# Patient Record
Sex: Male | Born: 1945 | Race: White | Hispanic: No | Marital: Married | Smoking: Never smoker
Health system: Southern US, Community
[De-identification: ages and names within clinical notes are randomized; demographics above are authoritative.]

## PROBLEM LIST (undated history)

## (undated) DIAGNOSIS — I10 Essential (primary) hypertension: Secondary | ICD-10-CM

## (undated) DIAGNOSIS — N189 Chronic kidney disease, unspecified: Secondary | ICD-10-CM

## (undated) DIAGNOSIS — F4310 Post-traumatic stress disorder, unspecified: Secondary | ICD-10-CM

## (undated) DIAGNOSIS — T884XXA Failed or difficult intubation, initial encounter: Secondary | ICD-10-CM

## (undated) DIAGNOSIS — I5032 Chronic diastolic (congestive) heart failure: Secondary | ICD-10-CM

## (undated) DIAGNOSIS — T4145XA Adverse effect of unspecified anesthetic, initial encounter: Secondary | ICD-10-CM

## (undated) DIAGNOSIS — I350 Nonrheumatic aortic (valve) stenosis: Secondary | ICD-10-CM

## (undated) DIAGNOSIS — I272 Pulmonary hypertension, unspecified: Secondary | ICD-10-CM

## (undated) DIAGNOSIS — S069X9A Unspecified intracranial injury with loss of consciousness of unspecified duration, initial encounter: Secondary | ICD-10-CM

## (undated) DIAGNOSIS — E662 Morbid (severe) obesity with alveolar hypoventilation: Secondary | ICD-10-CM

## (undated) DIAGNOSIS — F329 Major depressive disorder, single episode, unspecified: Secondary | ICD-10-CM

## (undated) DIAGNOSIS — N184 Chronic kidney disease, stage 4 (severe): Secondary | ICD-10-CM

## (undated) DIAGNOSIS — F32A Depression, unspecified: Secondary | ICD-10-CM

## (undated) DIAGNOSIS — C109 Malignant neoplasm of oropharynx, unspecified: Secondary | ICD-10-CM

## (undated) DIAGNOSIS — S069XAA Unspecified intracranial injury with loss of consciousness status unknown, initial encounter: Secondary | ICD-10-CM

## (undated) DIAGNOSIS — K224 Dyskinesia of esophagus: Secondary | ICD-10-CM

## (undated) DIAGNOSIS — J9611 Chronic respiratory failure with hypoxia: Secondary | ICD-10-CM

## (undated) DIAGNOSIS — D631 Anemia in chronic kidney disease: Secondary | ICD-10-CM

## (undated) DIAGNOSIS — E039 Hypothyroidism, unspecified: Secondary | ICD-10-CM

## (undated) DIAGNOSIS — K220 Achalasia of cardia: Secondary | ICD-10-CM

## (undated) DIAGNOSIS — T8859XA Other complications of anesthesia, initial encounter: Secondary | ICD-10-CM

## (undated) DIAGNOSIS — Z923 Personal history of irradiation: Secondary | ICD-10-CM

## (undated) HISTORY — DX: Post-traumatic stress disorder, unspecified: F43.10

## (undated) HISTORY — DX: Failed or difficult intubation, initial encounter: T88.4XXA

## (undated) HISTORY — DX: Essential (primary) hypertension: I10

## (undated) HISTORY — DX: Morbid (severe) obesity with alveolar hypoventilation: E66.2

## (undated) HISTORY — PX: LEG REPLANTATION: SHX1002

## (undated) HISTORY — PX: HIP ARTHROPLASTY: SHX981

## (undated) HISTORY — DX: Achalasia of cardia: K22.0

## (undated) HISTORY — DX: Morbid (severe) obesity due to excess calories: E66.01

---

## 1985-09-12 HISTORY — PX: HELLER MYOTOMY: SHX5259

## 2000-03-14 ENCOUNTER — Ambulatory Visit (HOSPITAL_COMMUNITY): Admission: RE | Admit: 2000-03-14 | Discharge: 2000-03-14 | Payer: Self-pay | Admitting: Gastroenterology

## 2000-08-30 ENCOUNTER — Emergency Department (HOSPITAL_COMMUNITY): Admission: EM | Admit: 2000-08-30 | Discharge: 2000-08-31 | Payer: Self-pay | Admitting: Emergency Medicine

## 2000-08-31 ENCOUNTER — Encounter: Payer: Self-pay | Admitting: Emergency Medicine

## 2001-08-27 ENCOUNTER — Encounter: Payer: Self-pay | Admitting: Gastroenterology

## 2001-08-27 ENCOUNTER — Encounter: Admission: RE | Admit: 2001-08-27 | Discharge: 2001-08-27 | Payer: Self-pay | Admitting: Gastroenterology

## 2002-11-07 ENCOUNTER — Ambulatory Visit (HOSPITAL_BASED_OUTPATIENT_CLINIC_OR_DEPARTMENT_OTHER): Admission: RE | Admit: 2002-11-07 | Discharge: 2002-11-07 | Payer: Self-pay | Admitting: Orthopedic Surgery

## 2002-11-08 HISTORY — PX: KNEE ARTHROSCOPY: SUR90

## 2002-11-08 HISTORY — PX: KNEE ARTHROSCOPY W/ PARTIAL MEDIAL MENISCECTOMY: SHX1882

## 2002-11-08 HISTORY — PX: CHONDROPLASTY: SHX5177

## 2005-10-16 ENCOUNTER — Ambulatory Visit: Payer: Self-pay | Admitting: Physical Medicine & Rehabilitation

## 2005-10-16 ENCOUNTER — Inpatient Hospital Stay (HOSPITAL_COMMUNITY): Admission: AC | Admit: 2005-10-16 | Discharge: 2005-11-09 | Payer: Self-pay

## 2005-11-09 ENCOUNTER — Inpatient Hospital Stay (HOSPITAL_COMMUNITY)
Admission: RE | Admit: 2005-11-09 | Discharge: 2005-11-24 | Payer: Self-pay | Admitting: Physical Medicine & Rehabilitation

## 2005-12-27 ENCOUNTER — Encounter
Admission: RE | Admit: 2005-12-27 | Discharge: 2006-03-27 | Payer: Self-pay | Admitting: Physical Medicine & Rehabilitation

## 2005-12-27 ENCOUNTER — Ambulatory Visit: Payer: Self-pay | Admitting: Physical Medicine & Rehabilitation

## 2005-12-27 ENCOUNTER — Encounter: Admission: RE | Admit: 2005-12-27 | Discharge: 2006-03-27 | Payer: Self-pay | Admitting: Orthopedic Surgery

## 2006-02-13 ENCOUNTER — Ambulatory Visit: Payer: Self-pay | Admitting: Psychology

## 2006-03-02 ENCOUNTER — Ambulatory Visit: Payer: Self-pay | Admitting: Physical Medicine & Rehabilitation

## 2006-04-06 ENCOUNTER — Encounter
Admission: RE | Admit: 2006-04-06 | Discharge: 2006-07-05 | Payer: Self-pay | Admitting: Physical Medicine & Rehabilitation

## 2006-04-06 ENCOUNTER — Ambulatory Visit: Payer: Self-pay | Admitting: Physical Medicine & Rehabilitation

## 2006-07-18 ENCOUNTER — Ambulatory Visit: Payer: Self-pay | Admitting: Psychology

## 2006-07-18 ENCOUNTER — Encounter
Admission: RE | Admit: 2006-07-18 | Discharge: 2006-07-18 | Payer: Self-pay | Admitting: Physical Medicine & Rehabilitation

## 2006-07-28 ENCOUNTER — Encounter
Admission: RE | Admit: 2006-07-28 | Discharge: 2006-10-26 | Payer: Self-pay | Admitting: Physical Medicine & Rehabilitation

## 2006-07-28 ENCOUNTER — Ambulatory Visit: Payer: Self-pay | Admitting: Physical Medicine & Rehabilitation

## 2006-08-23 ENCOUNTER — Ambulatory Visit: Payer: Self-pay | Admitting: Cardiology

## 2006-08-23 ENCOUNTER — Inpatient Hospital Stay (HOSPITAL_COMMUNITY): Admission: RE | Admit: 2006-08-23 | Discharge: 2006-08-28 | Payer: Self-pay | Admitting: Pulmonary Disease

## 2006-08-23 ENCOUNTER — Ambulatory Visit: Payer: Self-pay | Admitting: Pulmonary Disease

## 2006-08-24 ENCOUNTER — Encounter: Payer: Self-pay | Admitting: Cardiology

## 2006-08-29 ENCOUNTER — Encounter: Admission: RE | Admit: 2006-08-29 | Discharge: 2006-10-04 | Payer: Self-pay | Admitting: Pulmonary Disease

## 2006-08-30 ENCOUNTER — Encounter: Admission: RE | Admit: 2006-08-30 | Discharge: 2006-11-28 | Payer: Self-pay | Admitting: Pulmonary Disease

## 2006-08-31 ENCOUNTER — Emergency Department (HOSPITAL_COMMUNITY): Admission: EM | Admit: 2006-08-31 | Discharge: 2006-08-31 | Payer: Self-pay | Admitting: Emergency Medicine

## 2006-10-11 ENCOUNTER — Inpatient Hospital Stay (HOSPITAL_COMMUNITY): Admission: RE | Admit: 2006-10-11 | Discharge: 2006-10-16 | Payer: Self-pay | Admitting: Orthopedic Surgery

## 2006-11-07 ENCOUNTER — Encounter: Admission: RE | Admit: 2006-11-07 | Discharge: 2006-12-14 | Payer: Self-pay | Admitting: Orthopedic Surgery

## 2006-12-06 ENCOUNTER — Ambulatory Visit: Payer: Self-pay | Admitting: Physical Medicine & Rehabilitation

## 2006-12-06 ENCOUNTER — Encounter
Admission: RE | Admit: 2006-12-06 | Discharge: 2007-03-06 | Payer: Self-pay | Admitting: Physical Medicine & Rehabilitation

## 2007-03-06 ENCOUNTER — Ambulatory Visit: Payer: Self-pay | Admitting: Physical Medicine & Rehabilitation

## 2007-03-06 ENCOUNTER — Encounter
Admission: RE | Admit: 2007-03-06 | Discharge: 2007-03-12 | Payer: Self-pay | Admitting: Physical Medicine & Rehabilitation

## 2007-04-17 ENCOUNTER — Ambulatory Visit (HOSPITAL_BASED_OUTPATIENT_CLINIC_OR_DEPARTMENT_OTHER)
Admission: RE | Admit: 2007-04-17 | Discharge: 2007-04-17 | Payer: Self-pay | Admitting: Physical Medicine & Rehabilitation

## 2007-04-21 ENCOUNTER — Ambulatory Visit: Payer: Self-pay | Admitting: Internal Medicine

## 2007-05-03 ENCOUNTER — Ambulatory Visit: Payer: Self-pay | Admitting: Physical Medicine & Rehabilitation

## 2007-05-04 ENCOUNTER — Encounter
Admission: RE | Admit: 2007-05-04 | Discharge: 2007-06-12 | Payer: Self-pay | Admitting: Physical Medicine & Rehabilitation

## 2007-05-10 ENCOUNTER — Ambulatory Visit: Payer: Self-pay | Admitting: Pulmonary Disease

## 2007-07-23 ENCOUNTER — Encounter
Admission: RE | Admit: 2007-07-23 | Discharge: 2007-07-24 | Payer: Self-pay | Admitting: Physical Medicine & Rehabilitation

## 2007-07-23 ENCOUNTER — Ambulatory Visit: Payer: Self-pay | Admitting: Physical Medicine & Rehabilitation

## 2007-09-26 ENCOUNTER — Encounter
Admission: RE | Admit: 2007-09-26 | Discharge: 2007-11-23 | Payer: Self-pay | Admitting: Physical Medicine & Rehabilitation

## 2007-10-18 ENCOUNTER — Ambulatory Visit: Payer: Self-pay | Admitting: Physical Medicine & Rehabilitation

## 2007-10-18 ENCOUNTER — Encounter
Admission: RE | Admit: 2007-10-18 | Discharge: 2007-10-19 | Payer: Self-pay | Admitting: Physical Medicine & Rehabilitation

## 2008-02-11 ENCOUNTER — Emergency Department (HOSPITAL_COMMUNITY): Admission: EM | Admit: 2008-02-11 | Discharge: 2008-02-11 | Payer: Self-pay | Admitting: Emergency Medicine

## 2008-04-10 ENCOUNTER — Encounter
Admission: RE | Admit: 2008-04-10 | Discharge: 2008-05-26 | Payer: Self-pay | Admitting: Physical Medicine & Rehabilitation

## 2008-04-14 ENCOUNTER — Ambulatory Visit: Payer: Self-pay | Admitting: Physical Medicine & Rehabilitation

## 2008-05-26 ENCOUNTER — Ambulatory Visit: Payer: Self-pay | Admitting: Physical Medicine & Rehabilitation

## 2008-07-22 ENCOUNTER — Encounter
Admission: RE | Admit: 2008-07-22 | Discharge: 2008-07-23 | Payer: Self-pay | Admitting: Physical Medicine & Rehabilitation

## 2008-07-23 ENCOUNTER — Ambulatory Visit: Payer: Self-pay | Admitting: Physical Medicine & Rehabilitation

## 2008-10-22 ENCOUNTER — Encounter
Admission: RE | Admit: 2008-10-22 | Discharge: 2008-10-24 | Payer: Self-pay | Admitting: Physical Medicine & Rehabilitation

## 2008-10-24 ENCOUNTER — Ambulatory Visit: Payer: Self-pay | Admitting: Physical Medicine & Rehabilitation

## 2009-02-17 ENCOUNTER — Encounter
Admission: RE | Admit: 2009-02-17 | Discharge: 2009-03-10 | Payer: Self-pay | Admitting: Physical Medicine & Rehabilitation

## 2009-02-23 ENCOUNTER — Ambulatory Visit: Payer: Self-pay | Admitting: Physical Medicine & Rehabilitation

## 2009-03-12 ENCOUNTER — Encounter: Payer: Self-pay | Admitting: Pulmonary Disease

## 2009-03-13 ENCOUNTER — Encounter: Payer: Self-pay | Admitting: Pulmonary Disease

## 2009-03-19 DIAGNOSIS — I10 Essential (primary) hypertension: Secondary | ICD-10-CM

## 2009-03-20 ENCOUNTER — Ambulatory Visit: Payer: Self-pay | Admitting: Pulmonary Disease

## 2009-04-01 ENCOUNTER — Ambulatory Visit (HOSPITAL_BASED_OUTPATIENT_CLINIC_OR_DEPARTMENT_OTHER): Admission: RE | Admit: 2009-04-01 | Discharge: 2009-04-01 | Payer: Self-pay | Admitting: Pulmonary Disease

## 2009-04-01 ENCOUNTER — Encounter: Payer: Self-pay | Admitting: Pulmonary Disease

## 2009-04-06 ENCOUNTER — Encounter: Payer: Self-pay | Admitting: Pulmonary Disease

## 2009-04-08 ENCOUNTER — Ambulatory Visit: Payer: Self-pay | Admitting: Internal Medicine

## 2009-04-08 LAB — CONVERTED CEMR LAB
CO2: 34 meq/L — ABNORMAL HIGH (ref 19–32)
Creatinine, Ser: 1 mg/dL (ref 0.4–1.5)
Potassium: 4 meq/L (ref 3.5–5.1)
Sodium: 143 meq/L (ref 135–145)

## 2009-04-09 ENCOUNTER — Telehealth: Payer: Self-pay | Admitting: Pulmonary Disease

## 2009-04-10 ENCOUNTER — Ambulatory Visit: Payer: Self-pay | Admitting: Pulmonary Disease

## 2009-04-13 ENCOUNTER — Telehealth: Payer: Self-pay | Admitting: Internal Medicine

## 2009-04-14 ENCOUNTER — Encounter: Payer: Self-pay | Admitting: Internal Medicine

## 2009-04-16 ENCOUNTER — Ambulatory Visit: Payer: Self-pay

## 2009-04-16 ENCOUNTER — Ambulatory Visit: Payer: Self-pay | Admitting: Cardiology

## 2009-04-16 ENCOUNTER — Encounter: Payer: Self-pay | Admitting: Internal Medicine

## 2009-04-16 ENCOUNTER — Telehealth (INDEPENDENT_AMBULATORY_CARE_PROVIDER_SITE_OTHER): Payer: Self-pay | Admitting: *Deleted

## 2009-04-23 ENCOUNTER — Encounter: Payer: Self-pay | Admitting: Internal Medicine

## 2009-04-23 ENCOUNTER — Telehealth: Payer: Self-pay | Admitting: Internal Medicine

## 2009-04-24 ENCOUNTER — Telehealth: Payer: Self-pay | Admitting: Internal Medicine

## 2009-05-07 ENCOUNTER — Encounter: Payer: Self-pay | Admitting: Pulmonary Disease

## 2009-05-11 ENCOUNTER — Encounter: Payer: Self-pay | Admitting: Pulmonary Disease

## 2009-05-25 ENCOUNTER — Encounter: Payer: Self-pay | Admitting: Pulmonary Disease

## 2009-05-26 ENCOUNTER — Telehealth: Payer: Self-pay | Admitting: Pulmonary Disease

## 2009-05-27 ENCOUNTER — Ambulatory Visit: Payer: Self-pay | Admitting: Pulmonary Disease

## 2009-06-01 ENCOUNTER — Encounter: Payer: Self-pay | Admitting: Pulmonary Disease

## 2009-06-05 ENCOUNTER — Encounter: Payer: Self-pay | Admitting: Pulmonary Disease

## 2009-06-05 ENCOUNTER — Ambulatory Visit: Payer: Self-pay | Admitting: Internal Medicine

## 2009-06-09 ENCOUNTER — Telehealth (INDEPENDENT_AMBULATORY_CARE_PROVIDER_SITE_OTHER): Payer: Self-pay | Admitting: *Deleted

## 2009-06-10 ENCOUNTER — Ambulatory Visit: Payer: Self-pay

## 2009-06-10 ENCOUNTER — Encounter: Payer: Self-pay | Admitting: Cardiology

## 2009-06-11 ENCOUNTER — Ambulatory Visit: Payer: Self-pay

## 2009-06-20 ENCOUNTER — Encounter: Payer: Self-pay | Admitting: Pulmonary Disease

## 2009-06-23 ENCOUNTER — Telehealth (INDEPENDENT_AMBULATORY_CARE_PROVIDER_SITE_OTHER): Payer: Self-pay | Admitting: *Deleted

## 2009-06-29 ENCOUNTER — Telehealth: Payer: Self-pay | Admitting: Pulmonary Disease

## 2009-06-30 ENCOUNTER — Encounter: Payer: Self-pay | Admitting: Pulmonary Disease

## 2009-08-10 ENCOUNTER — Encounter: Payer: Self-pay | Admitting: Pulmonary Disease

## 2009-09-08 ENCOUNTER — Encounter
Admission: RE | Admit: 2009-09-08 | Discharge: 2009-09-09 | Payer: Self-pay | Admitting: Physical Medicine & Rehabilitation

## 2009-09-09 ENCOUNTER — Ambulatory Visit: Payer: Self-pay | Admitting: Physical Medicine & Rehabilitation

## 2009-09-14 ENCOUNTER — Encounter: Payer: Self-pay | Admitting: Pulmonary Disease

## 2009-09-25 ENCOUNTER — Encounter: Payer: Self-pay | Admitting: Pulmonary Disease

## 2009-10-12 ENCOUNTER — Ambulatory Visit: Payer: Self-pay | Admitting: Pulmonary Disease

## 2009-10-15 ENCOUNTER — Encounter
Admission: RE | Admit: 2009-10-15 | Discharge: 2009-11-05 | Payer: Self-pay | Admitting: Physical Medicine & Rehabilitation

## 2009-12-04 ENCOUNTER — Ambulatory Visit: Payer: Self-pay | Admitting: Internal Medicine

## 2010-03-24 ENCOUNTER — Encounter: Payer: Self-pay | Admitting: Pulmonary Disease

## 2010-04-19 ENCOUNTER — Ambulatory Visit: Payer: Self-pay | Admitting: Pulmonary Disease

## 2010-04-21 ENCOUNTER — Ambulatory Visit: Payer: Self-pay | Admitting: Cardiology

## 2010-04-21 ENCOUNTER — Telehealth: Payer: Self-pay | Admitting: Pulmonary Disease

## 2010-04-22 ENCOUNTER — Other Ambulatory Visit: Admission: RE | Admit: 2010-04-22 | Discharge: 2010-04-22 | Payer: Self-pay | Admitting: Otolaryngology

## 2010-04-22 ENCOUNTER — Encounter: Payer: Self-pay | Admitting: Pulmonary Disease

## 2010-04-24 ENCOUNTER — Encounter: Payer: Self-pay | Admitting: Pulmonary Disease

## 2010-04-28 ENCOUNTER — Encounter: Payer: Self-pay | Admitting: Pulmonary Disease

## 2010-04-29 ENCOUNTER — Telehealth (INDEPENDENT_AMBULATORY_CARE_PROVIDER_SITE_OTHER): Payer: Self-pay | Admitting: *Deleted

## 2010-05-03 ENCOUNTER — Encounter: Payer: Self-pay | Admitting: Pulmonary Disease

## 2010-05-04 ENCOUNTER — Ambulatory Visit
Admission: RE | Admit: 2010-05-04 | Discharge: 2010-07-22 | Payer: Self-pay | Source: Home / Self Care | Admitting: Radiation Oncology

## 2010-05-06 ENCOUNTER — Encounter: Admission: AD | Admit: 2010-05-06 | Discharge: 2010-05-06 | Payer: Self-pay | Admitting: Dentistry

## 2010-05-06 ENCOUNTER — Ambulatory Visit: Payer: Self-pay | Admitting: Oncology

## 2010-05-06 ENCOUNTER — Ambulatory Visit: Payer: Self-pay | Admitting: Dentistry

## 2010-05-07 ENCOUNTER — Encounter
Admission: RE | Admit: 2010-05-07 | Discharge: 2010-08-05 | Payer: Self-pay | Source: Home / Self Care | Admitting: Physical Medicine & Rehabilitation

## 2010-05-07 ENCOUNTER — Encounter: Payer: Self-pay | Admitting: Pulmonary Disease

## 2010-05-07 ENCOUNTER — Ambulatory Visit: Payer: Self-pay | Admitting: Oncology

## 2010-05-07 LAB — CBC WITH DIFFERENTIAL/PLATELET
BASO%: 0.2 % (ref 0.0–2.0)
EOS%: 0.7 % (ref 0.0–7.0)
LYMPH%: 13.7 % — ABNORMAL LOW (ref 14.0–49.0)
MCH: 31.3 pg (ref 27.2–33.4)
MCHC: 34.3 g/dL (ref 32.0–36.0)
NEUT#: 13.1 10*3/uL — ABNORMAL HIGH (ref 1.5–6.5)
Platelets: 186 10*3/uL (ref 140–400)
RBC: 4.89 10*6/uL (ref 4.20–5.82)
WBC: 16.6 10*3/uL — ABNORMAL HIGH (ref 4.0–10.3)

## 2010-05-07 LAB — COMPREHENSIVE METABOLIC PANEL
ALT: 27 U/L (ref 0–53)
AST: 19 U/L (ref 0–37)
Albumin: 4.1 g/dL (ref 3.5–5.2)
Alkaline Phosphatase: 125 U/L — ABNORMAL HIGH (ref 39–117)
BUN: 21 mg/dL (ref 6–23)
CO2: 29 mEq/L (ref 19–32)
Chloride: 91 mEq/L — ABNORMAL LOW (ref 96–112)
Sodium: 132 mEq/L — ABNORMAL LOW (ref 135–145)
Total Bilirubin: 0.4 mg/dL (ref 0.3–1.2)

## 2010-05-14 ENCOUNTER — Encounter: Payer: Self-pay | Admitting: Pulmonary Disease

## 2010-05-18 ENCOUNTER — Ambulatory Visit: Payer: Self-pay | Admitting: Dentistry

## 2010-05-18 ENCOUNTER — Encounter
Admission: RE | Admit: 2010-05-18 | Discharge: 2010-08-16 | Payer: Self-pay | Source: Home / Self Care | Admitting: Oncology

## 2010-05-18 ENCOUNTER — Ambulatory Visit (HOSPITAL_COMMUNITY): Admission: RE | Admit: 2010-05-18 | Discharge: 2010-05-18 | Payer: Self-pay | Admitting: Radiation Oncology

## 2010-05-19 ENCOUNTER — Encounter: Payer: Self-pay | Admitting: Pulmonary Disease

## 2010-05-21 ENCOUNTER — Ambulatory Visit (HOSPITAL_COMMUNITY): Admission: RE | Admit: 2010-05-21 | Discharge: 2010-05-21 | Payer: Self-pay | Admitting: Radiation Oncology

## 2010-05-27 ENCOUNTER — Telehealth: Payer: Self-pay | Admitting: Pulmonary Disease

## 2010-06-02 ENCOUNTER — Encounter: Payer: Self-pay | Admitting: Internal Medicine

## 2010-06-02 LAB — CBC WITH DIFFERENTIAL/PLATELET
BASO%: 0.2 % (ref 0.0–2.0)
Basophils Absolute: 0 10*3/uL (ref 0.0–0.1)
HCT: 44.2 % (ref 38.4–49.9)
MCH: 31.3 pg (ref 27.2–33.4)
MCHC: 34.2 g/dL (ref 32.0–36.0)
MONO#: 1.2 10*3/uL — ABNORMAL HIGH (ref 0.1–0.9)
NEUT#: 16.4 10*3/uL — ABNORMAL HIGH (ref 1.5–6.5)
NEUT%: 84.3 % — ABNORMAL HIGH (ref 39.0–75.0)

## 2010-06-02 LAB — COMPREHENSIVE METABOLIC PANEL
Albumin: 4.2 g/dL (ref 3.5–5.2)
Alkaline Phosphatase: 109 U/L (ref 39–117)
BUN: 18 mg/dL (ref 6–23)
Creatinine, Ser: 0.91 mg/dL (ref 0.40–1.50)
Glucose, Bld: 110 mg/dL — ABNORMAL HIGH (ref 70–99)
Potassium: 3.8 mEq/L (ref 3.5–5.3)
Sodium: 132 mEq/L — ABNORMAL LOW (ref 135–145)
Total Bilirubin: 0.9 mg/dL (ref 0.3–1.2)

## 2010-06-07 ENCOUNTER — Ambulatory Visit: Payer: Self-pay | Admitting: Oncology

## 2010-06-08 LAB — BASIC METABOLIC PANEL
CO2: 27 mEq/L (ref 19–32)
Chloride: 81 mEq/L — ABNORMAL LOW (ref 96–112)
Glucose, Bld: 101 mg/dL — ABNORMAL HIGH (ref 70–99)
Potassium: 4 mEq/L (ref 3.5–5.3)

## 2010-06-15 LAB — BASIC METABOLIC PANEL
Chloride: 84 mEq/L — ABNORMAL LOW (ref 96–112)
Potassium: 4.1 mEq/L (ref 3.5–5.3)
Sodium: 123 mEq/L — ABNORMAL LOW (ref 135–145)

## 2010-06-15 LAB — MAGNESIUM: Magnesium: 1.5 mg/dL (ref 1.5–2.5)

## 2010-06-22 ENCOUNTER — Encounter: Payer: Self-pay | Admitting: Pulmonary Disease

## 2010-06-22 LAB — CBC WITH DIFFERENTIAL/PLATELET
EOS%: 0.6 % (ref 0.0–7.0)
Eosinophils Absolute: 0 10*3/uL (ref 0.0–0.5)
MCV: 91.1 fL (ref 79.3–98.0)
NEUT#: 1.8 10*3/uL (ref 1.5–6.5)
WBC: 2.9 10*3/uL — ABNORMAL LOW (ref 4.0–10.3)

## 2010-06-22 LAB — COMPREHENSIVE METABOLIC PANEL
ALT: 17 U/L (ref 0–53)
AST: 19 U/L (ref 0–37)
CO2: 30 mEq/L (ref 19–32)
Calcium: 9 mg/dL (ref 8.4–10.5)
Glucose, Bld: 98 mg/dL (ref 70–99)
Total Bilirubin: 0.8 mg/dL (ref 0.3–1.2)
Total Protein: 7.4 g/dL (ref 6.0–8.3)

## 2010-06-23 LAB — SODIUM, URINE, RANDOM: Sodium, Ur: 56 mEq/L

## 2010-06-23 LAB — OSMOLALITY, URINE: Osmolality, Ur: 366 mOsm/kg — ABNORMAL LOW (ref 390–1090)

## 2010-06-29 ENCOUNTER — Ambulatory Visit: Payer: Self-pay | Admitting: Oncology

## 2010-06-29 ENCOUNTER — Inpatient Hospital Stay (HOSPITAL_COMMUNITY): Admission: EM | Admit: 2010-06-29 | Discharge: 2010-07-07 | Payer: Self-pay | Admitting: Emergency Medicine

## 2010-06-29 ENCOUNTER — Encounter (INDEPENDENT_AMBULATORY_CARE_PROVIDER_SITE_OTHER): Payer: Self-pay | Admitting: Internal Medicine

## 2010-07-02 ENCOUNTER — Other Ambulatory Visit: Payer: Self-pay | Admitting: Oncology

## 2010-07-02 ENCOUNTER — Other Ambulatory Visit: Payer: Self-pay | Admitting: Nephrology

## 2010-07-03 ENCOUNTER — Other Ambulatory Visit: Payer: Self-pay | Admitting: Oncology

## 2010-07-04 ENCOUNTER — Other Ambulatory Visit: Payer: Self-pay | Admitting: Oncology

## 2010-07-05 ENCOUNTER — Other Ambulatory Visit: Payer: Self-pay | Admitting: Internal Medicine

## 2010-07-06 ENCOUNTER — Telehealth (INDEPENDENT_AMBULATORY_CARE_PROVIDER_SITE_OTHER): Payer: Self-pay | Admitting: *Deleted

## 2010-07-06 ENCOUNTER — Other Ambulatory Visit: Payer: Self-pay | Admitting: Oncology

## 2010-07-07 ENCOUNTER — Other Ambulatory Visit: Payer: Self-pay | Admitting: Oncology

## 2010-07-07 ENCOUNTER — Ambulatory Visit: Payer: Self-pay | Admitting: Oncology

## 2010-07-09 LAB — BASIC METABOLIC PANEL
BUN: 82 mg/dL — ABNORMAL HIGH (ref 6–23)
CO2: 35 mEq/L — ABNORMAL HIGH (ref 19–32)
Calcium: 9.2 mg/dL (ref 8.4–10.5)
Chloride: 88 mEq/L — ABNORMAL LOW (ref 96–112)
Creatinine, Ser: 3.41 mg/dL — ABNORMAL HIGH (ref 0.40–1.50)
Glucose, Bld: 130 mg/dL — ABNORMAL HIGH (ref 70–99)
Potassium: 4.7 mEq/L (ref 3.5–5.3)
Sodium: 133 mEq/L — ABNORMAL LOW (ref 135–145)

## 2010-07-12 ENCOUNTER — Ambulatory Visit (HOSPITAL_COMMUNITY): Admission: RE | Admit: 2010-07-12 | Discharge: 2010-07-12 | Payer: Self-pay | Admitting: Oncology

## 2010-07-12 ENCOUNTER — Ambulatory Visit: Payer: Self-pay | Admitting: Gastroenterology

## 2010-07-12 ENCOUNTER — Inpatient Hospital Stay (HOSPITAL_COMMUNITY): Admission: EM | Admit: 2010-07-12 | Discharge: 2010-07-14 | Payer: Self-pay | Admitting: Emergency Medicine

## 2010-07-15 LAB — COMPREHENSIVE METABOLIC PANEL
ALT: 18 U/L (ref 0–53)
AST: 15 U/L (ref 0–37)
Alkaline Phosphatase: 115 U/L (ref 39–117)
Calcium: 9.2 mg/dL (ref 8.4–10.5)
Chloride: 92 mEq/L — ABNORMAL LOW (ref 96–112)
Sodium: 138 mEq/L (ref 135–145)
Total Bilirubin: 0.5 mg/dL (ref 0.3–1.2)

## 2010-07-15 LAB — CBC WITH DIFFERENTIAL/PLATELET
BASO%: 0.3 % (ref 0.0–2.0)
Basophils Absolute: 0 10*3/uL (ref 0.0–0.1)
EOS%: 0.8 % (ref 0.0–7.0)
Eosinophils Absolute: 0 10*3/uL (ref 0.0–0.5)
HCT: 32.1 % — ABNORMAL LOW (ref 38.4–49.9)
MCH: 32.8 pg (ref 27.2–33.4)
MCHC: 35.5 g/dL (ref 32.0–36.0)
MCV: 92.4 fL (ref 79.3–98.0)
MONO#: 0.9 10*3/uL (ref 0.1–0.9)
NEUT#: 3.5 10*3/uL (ref 1.5–6.5)
NEUT%: 69.9 % (ref 39.0–75.0)
RBC: 3.47 10*6/uL — ABNORMAL LOW (ref 4.20–5.82)
lymph#: 0.6 10*3/uL — ABNORMAL LOW (ref 0.9–3.3)

## 2010-07-22 LAB — COMPREHENSIVE METABOLIC PANEL
ALT: 19 U/L (ref 0–53)
AST: 20 U/L (ref 0–37)
Albumin: 3.3 g/dL — ABNORMAL LOW (ref 3.5–5.2)
Alkaline Phosphatase: 107 U/L (ref 39–117)
BUN: 63 mg/dL — ABNORMAL HIGH (ref 6–23)
Calcium: 9.3 mg/dL (ref 8.4–10.5)
Chloride: 91 mEq/L — ABNORMAL LOW (ref 96–112)
Potassium: 4.2 mEq/L (ref 3.5–5.3)
Sodium: 139 mEq/L (ref 135–145)

## 2010-07-22 LAB — CBC WITH DIFFERENTIAL/PLATELET
BASO%: 0 % (ref 0.0–2.0)
Basophils Absolute: 0 10*3/uL (ref 0.0–0.1)
EOS%: 0.2 % (ref 0.0–7.0)
HGB: 10.6 g/dL — ABNORMAL LOW (ref 13.0–17.1)
MCH: 32.1 pg (ref 27.2–33.4)
MCHC: 34.1 g/dL (ref 32.0–36.0)
MCV: 94.1 fL (ref 79.3–98.0)
MONO%: 10.9 % (ref 0.0–14.0)
RBC: 3.31 10*6/uL — ABNORMAL LOW (ref 4.20–5.82)
RDW: 13.5 % (ref 11.0–14.6)
lymph#: 0.5 10*3/uL — ABNORMAL LOW (ref 0.9–3.3)

## 2010-08-03 ENCOUNTER — Ambulatory Visit: Payer: Self-pay | Admitting: Oncology

## 2010-08-04 LAB — PHOSPHORUS: Phosphorus: 4 mg/dL (ref 2.3–4.6)

## 2010-08-04 LAB — MAGNESIUM: Magnesium: 1.9 mg/dL (ref 1.5–2.5)

## 2010-08-04 LAB — BASIC METABOLIC PANEL
BUN: 54 mg/dL — ABNORMAL HIGH (ref 6–23)
Calcium: 9.6 mg/dL (ref 8.4–10.5)
Glucose, Bld: 144 mg/dL — ABNORMAL HIGH (ref 70–99)
Potassium: 4.6 mEq/L (ref 3.5–5.3)

## 2010-08-10 ENCOUNTER — Encounter
Admission: RE | Admit: 2010-08-10 | Discharge: 2010-10-12 | Payer: Self-pay | Source: Home / Self Care | Attending: Physical Medicine & Rehabilitation | Admitting: Physical Medicine & Rehabilitation

## 2010-08-17 ENCOUNTER — Ambulatory Visit (HOSPITAL_COMMUNITY)
Admission: RE | Admit: 2010-08-17 | Discharge: 2010-08-17 | Payer: Self-pay | Source: Home / Self Care | Admitting: Radiation Oncology

## 2010-08-18 ENCOUNTER — Encounter
Admission: RE | Admit: 2010-08-18 | Discharge: 2010-09-08 | Payer: Self-pay | Source: Home / Self Care | Attending: Oncology | Admitting: Oncology

## 2010-08-18 ENCOUNTER — Ambulatory Visit: Payer: Self-pay | Admitting: Physical Medicine & Rehabilitation

## 2010-08-18 LAB — CBC WITH DIFFERENTIAL/PLATELET
Basophils Absolute: 0 10*3/uL (ref 0.0–0.1)
HCT: 26.1 % — ABNORMAL LOW (ref 38.4–49.9)
HGB: 9.1 g/dL — ABNORMAL LOW (ref 13.0–17.1)
MCV: 94.9 fL (ref 79.3–98.0)
MONO#: 0.6 10*3/uL (ref 0.1–0.9)
MONO%: 10.2 % (ref 0.0–14.0)
NEUT#: 4.9 10*3/uL (ref 1.5–6.5)
Platelets: 212 10*3/uL (ref 140–400)
RBC: 2.75 10*6/uL — ABNORMAL LOW (ref 4.20–5.82)
RDW: 14.4 % (ref 11.0–14.6)
WBC: 5.9 10*3/uL (ref 4.0–10.3)
lymph#: 0.4 10*3/uL — ABNORMAL LOW (ref 0.9–3.3)

## 2010-08-18 LAB — COMPREHENSIVE METABOLIC PANEL
ALT: 10 U/L (ref 0–53)
CO2: 30 mEq/L (ref 19–32)
Calcium: 9.2 mg/dL (ref 8.4–10.5)
Creatinine, Ser: 2.54 mg/dL — ABNORMAL HIGH (ref 0.40–1.50)
Glucose, Bld: 116 mg/dL — ABNORMAL HIGH (ref 70–99)
Total Bilirubin: 0.3 mg/dL (ref 0.3–1.2)

## 2010-08-24 ENCOUNTER — Ambulatory Visit (HOSPITAL_COMMUNITY)
Admission: RE | Admit: 2010-08-24 | Discharge: 2010-08-24 | Payer: Self-pay | Source: Home / Self Care | Attending: Radiation Oncology | Admitting: Radiation Oncology

## 2010-09-02 ENCOUNTER — Ambulatory Visit (HOSPITAL_BASED_OUTPATIENT_CLINIC_OR_DEPARTMENT_OTHER): Payer: Medicare Other | Admitting: Oncology

## 2010-09-02 LAB — BASIC METABOLIC PANEL
CO2: 28 mEq/L (ref 19–32)
Chloride: 101 mEq/L (ref 96–112)
Creatinine, Ser: 2.78 mg/dL — ABNORMAL HIGH (ref 0.40–1.50)

## 2010-09-08 ENCOUNTER — Encounter
Admission: RE | Admit: 2010-09-08 | Discharge: 2010-10-12 | Payer: Self-pay | Source: Home / Self Care | Attending: Oncology | Admitting: Oncology

## 2010-09-22 LAB — CBC WITH DIFFERENTIAL/PLATELET
BASO%: 0.2 % (ref 0.0–2.0)
Basophils Absolute: 0 10*3/uL (ref 0.0–0.1)
EOS%: 1.7 % (ref 0.0–7.0)
Eosinophils Absolute: 0.2 10*3/uL (ref 0.0–0.5)
HCT: 28.1 % — ABNORMAL LOW (ref 38.4–49.9)
HGB: 9.6 g/dL — ABNORMAL LOW (ref 13.0–17.1)
LYMPH%: 6.2 % — ABNORMAL LOW (ref 14.0–49.0)
MCH: 32.1 pg (ref 27.2–33.4)
MCHC: 34.3 g/dL (ref 32.0–36.0)
MCV: 93.6 fL (ref 79.3–98.0)
MONO#: 0.8 10*3/uL (ref 0.1–0.9)
MONO%: 8.6 % (ref 0.0–14.0)
NEUT#: 7.7 10*3/uL — ABNORMAL HIGH (ref 1.5–6.5)
NEUT%: 83.3 % — ABNORMAL HIGH (ref 39.0–75.0)
Platelets: 167 10*3/uL (ref 140–400)
RBC: 3.01 10*6/uL — ABNORMAL LOW (ref 4.20–5.82)
RDW: 11.8 % (ref 11.0–14.6)
WBC: 9.2 10*3/uL (ref 4.0–10.3)
lymph#: 0.6 10*3/uL — ABNORMAL LOW (ref 0.9–3.3)

## 2010-09-22 LAB — COMPREHENSIVE METABOLIC PANEL
ALT: <8 U/L (ref 0–53)
AST: 9 U/L (ref 0–37)
Albumin: 3.9 g/dL (ref 3.5–5.2)
Alkaline Phosphatase: 108 U/L (ref 39–117)
BUN: 67 mg/dL — ABNORMAL HIGH (ref 6–23)
CO2: 28 mEq/L (ref 19–32)
Calcium: 9.2 mg/dL (ref 8.4–10.5)
Chloride: 100 mEq/L (ref 96–112)
Creatinine, Ser: 3.26 mg/dL — ABNORMAL HIGH (ref 0.40–1.50)
Glucose, Bld: 140 mg/dL — ABNORMAL HIGH (ref 70–99)
Potassium: 4.4 mEq/L (ref 3.5–5.3)
Sodium: 140 mEq/L (ref 135–145)
Total Bilirubin: 0.4 mg/dL (ref 0.3–1.2)
Total Protein: 6.7 g/dL (ref 6.0–8.3)

## 2010-09-22 LAB — PHOSPHORUS: Phosphorus: 5 mg/dL — ABNORMAL HIGH (ref 2.3–4.6)

## 2010-09-22 LAB — MAGNESIUM: Magnesium: 1.7 mg/dL (ref 1.5–2.5)

## 2010-09-30 ENCOUNTER — Other Ambulatory Visit: Payer: Self-pay | Admitting: Oncology

## 2010-09-30 DIAGNOSIS — IMO0002 Reserved for concepts with insufficient information to code with codable children: Secondary | ICD-10-CM

## 2010-10-02 ENCOUNTER — Other Ambulatory Visit: Payer: Self-pay | Admitting: Radiation Oncology

## 2010-10-03 ENCOUNTER — Encounter: Payer: Self-pay | Admitting: Radiation Oncology

## 2010-10-06 ENCOUNTER — Other Ambulatory Visit: Payer: Self-pay | Admitting: Radiation Oncology

## 2010-10-06 DIAGNOSIS — IMO0002 Reserved for concepts with insufficient information to code with codable children: Secondary | ICD-10-CM

## 2010-10-07 ENCOUNTER — Encounter: Payer: Self-pay | Admitting: Pulmonary Disease
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
## 2010-10-12 NOTE — Letter (Signed)
Summary: [facility]  [facility]   Imported By: Sherian Rein 06/07/2010 11:18:14  _____________________________________________________________________  External Attachment:    Type:   Image     Comment:   External Document

## 2010-10-12 NOTE — Letter (Signed)
Summary: CMN / [facility]  CMN / [facility]   Imported By: Lennie Odor 05/24/2010 09:16:49  _____________________________________________________________________  External Attachment:    Type:   Image     Comment:   External Document

## 2010-10-12 NOTE — Letter (Signed)
Summary: CMN for Oxygen/[facility]  [facility] for Oxygen/[facility]   Imported By: Sherian Rein 05/24/2010 08:09:02  _____________________________________________________________________  External Attachment:    Type:   Image     Comment:   External Document

## 2010-10-12 NOTE — Procedures (Signed)
Summary: Compliance/[facility]  Compliance/[facility]   Imported By: Lester Vesper 05/07/2010 08:51:55  _____________________________________________________________________  External Attachment:    Type:   Image     Comment:   External Document

## 2010-10-12 NOTE — Letter (Signed)
Summary: CMN for PAP Supplies/[facility]  [facility]N for PAP Supplies/[facility]   Imported By: Sherian Rein 06/28/2010 14:28:02  _____________________________________________________________________  External Attachment:    Type:   Image     Comment:   External Document

## 2010-10-12 NOTE — Letter (Signed)
Summary: [facility]  [facility]   Imported By: Lennie Odor 06/07/2010 12:12:11  _____________________________________________________________________  External Attachment:    Type:   Image     Comment:   External Document

## 2010-10-12 NOTE — Letter (Signed)
Summary: CMN CPAP supplies/[facility]  [facility] CPAP supplies/[facility]   Imported By: Lester Sprague 09/28/2009 08:54:47  _____________________________________________________________________  External Attachment:    Type:   Image     Comment:   External Document

## 2010-10-12 NOTE — Progress Notes (Signed)
Summary: test results  Phone Note Call from Patient Call back at [phone]   Caller: [email or web address]//karen Call For: sood Summary of Call: Wants test results from O2 level. Initial call taken by: Darletta Moll,  April 29, 2010 8:35 AM  Follow-up for Phone Call        pt aware results are here and in dr sood's look at and dr sood will be back in the office on monday and we can then call her with the results, wife verbalized understanding and was fine with this.     Philipp Deputy [facility]  April 29, 2010 10:23 AM   Additional Follow-up for Phone Call Additional follow up Details #1::        d/w wife over the phone.  Please see clinical update from 05/03/10 for details. Additional Follow-up by: Coralyn Helling MD,  May 03, 2010 12:55 PM    Additional Follow-up for Phone Call Additional follow up Details #2::    Dr. Craige Cotta with the patients spouse.Michel Bickers CMA  May 03, 2010 1:01 PM

## 2010-10-12 NOTE — Progress Notes (Signed)
Summary: D/C BPAP  Phone Note Call from Patient   Caller: [email or web address] Call For: sood Summary of Call: pt has stage4 throat ca does he need to discontine bipap family said he no longer can use it  Initial call taken by: Oneita Jolly,  July 06, 2010 2:08 PM  Follow-up for Phone Call        He does not need to continue if he is unable to tolerate.  Can send order to his DME to remove BPAP if that is what he is requesting. Follow-up by: Coralyn Helling MD,  July 06, 2010 2:13 PM  Additional Follow-up for Phone Call Additional follow up Details #1::        lori will you put in a dc order for this pt's bipap thanks Additional Follow-up by: Oneita Jolly,  July 06, 2010 2:30 PM    Additional Follow-up for Phone Call Additional follow up Details #2::    Order put in to DC Bpap.Michel Bickers [facility]  July 06, 2010 2:37 PM
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
## 2010-10-12 NOTE — Miscellaneous (Signed)
Summary: Overnight oximetry on BPAP and room air.  Clinical Lists Changes Test time 3hrs 49 min.  Mean SpO2 91.6%, low 80%.  Spent 28 sec (3.2%) with SpO2 < 88%.  Results d/w pt's wife over the phone.  Will d/c home oxygen set up.  He is to continue BPAP for now.  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  Explained that he should continue with BPAP as tolerated for now.  If he is unable to tolerate BPAP due to side effects for chemo and RT that he should call me.  Also advised that he will likely lose weight with therapy, and that his need for BPAP may change.  Appended Document: Overnight oximetry on BPAP and room air.    Clinical Lists Changes  Orders: Added new Referral order of DME Referral (DME) - Signed

## 2010-10-12 NOTE — Progress Notes (Signed)
Summary: results  Phone Note Call from Patient Call back at [phone]   Caller: Anson Crofts Call For: Craige Cotta Reason for Call: Talk to Nurse, Lab or Test Results Summary of Call: Calling to get results of CT from [facility].  Call her on cell phone [phone] Initial call taken by: Eugene Gavia,  April 21, 2010 3:12 PM  Follow-up for Phone Call        Pt's wife is anxious to know the results; aware that VS is not in the office; pt's wife states that she was told VS was working the hospital today and would not be a problem of getting results today. I told Clydie Braun that I am sending message as URGENT to VS for him to review and send back asap to call her with results.Reynaldo Minium CMA  April 21, 2010 3:18 PM   Additional Follow-up for Phone Call Additional follow up Details #1::        Results d/w pt's wife over the phone. Additional Follow-up by: Coralyn Helling MD,  April 21, 2010 3:30 PM
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
## 2010-10-12 NOTE — Miscellaneous (Signed)
Summary: autoBPAP download  Clinical Lists Changes Used on 29 of 31 nights with average 4 hrs.  Optimal pressure 15/9 with average AHI 3.2.  Minimal airleak.

## 2010-10-12 NOTE — Letter (Signed)
Summary: [facility]  [facility]   Imported By: Marylou Mccoy 06/25/2010 12:08:08  _____________________________________________________________________  External Attachment:    Type:   Image     Comment:   External Document

## 2010-10-12 NOTE — Consult Note (Signed)
Summary: [facility]  [facility]   Imported By: Sherian Rein 04/30/2010 11:25:09  _____________________________________________________________________  External Attachment:    Type:   Image     Comment:   External Document

## 2010-10-12 NOTE — Letter (Signed)
Summary: CMN for PAP/[facility]  CMN for PAP/[facility]   Imported By: Sherian Rein 09/18/2009 11:27:29  _____________________________________________________________________  External Attachment:    Type:   Image     Comment:   External Document

## 2010-10-14 ENCOUNTER — Encounter (HOSPITAL_COMMUNITY): Payer: Self-pay

## 2010-10-14 ENCOUNTER — Ambulatory Visit (HOSPITAL_COMMUNITY)
Admission: RE | Admit: 2010-10-14 | Discharge: 2010-10-14 | Disposition: A | Discharge status: Home / Self Care | Payer: Medicare Other | Source: Ambulatory Visit | Attending: Oncology | Admitting: Oncology

## 2010-10-14 DIAGNOSIS — I2510 Atherosclerotic heart disease of native coronary artery without angina pectoris: Secondary | ICD-10-CM | POA: Insufficient documentation

## 2010-10-14 DIAGNOSIS — N400 Enlarged prostate without lower urinary tract symptoms: Secondary | ICD-10-CM | POA: Insufficient documentation

## 2010-10-14 DIAGNOSIS — I517 Cardiomegaly: Secondary | ICD-10-CM | POA: Insufficient documentation

## 2010-10-14 DIAGNOSIS — K219 Gastro-esophageal reflux disease without esophagitis: Secondary | ICD-10-CM | POA: Insufficient documentation

## 2010-10-14 DIAGNOSIS — K449 Diaphragmatic hernia without obstruction or gangrene: Secondary | ICD-10-CM | POA: Insufficient documentation

## 2010-10-14 DIAGNOSIS — C760 Malignant neoplasm of head, face and neck: Secondary | ICD-10-CM | POA: Insufficient documentation

## 2010-10-14 DIAGNOSIS — IMO0002 Reserved for concepts with insufficient information to code with codable children: Secondary | ICD-10-CM

## 2010-10-14 MED ORDER — FLUDEOXYGLUCOSE F - 18 (FDG) INJECTION: 16.2000 | Freq: Once | INTRAVENOUS | Status: AC | PRN

## 2010-10-15 ENCOUNTER — Ambulatory Visit: Payer: Medicare Other | Attending: Radiation Oncology | Admitting: Radiation Oncology

## 2010-10-15 ENCOUNTER — Ambulatory Visit: Payer: Self-pay | Admitting: Radiation Oncology

## 2010-10-18 ENCOUNTER — Other Ambulatory Visit: Payer: Self-pay | Admitting: Radiation Oncology

## 2010-10-18 ENCOUNTER — Ambulatory Visit: Payer: Medicare Other | Attending: Oncology

## 2010-10-18 DIAGNOSIS — IMO0001 Reserved for inherently not codable concepts without codable children: Secondary | ICD-10-CM | POA: Insufficient documentation

## 2010-10-18 DIAGNOSIS — R1319 Other dysphagia: Secondary | ICD-10-CM | POA: Insufficient documentation

## 2010-10-20 ENCOUNTER — Other Ambulatory Visit (HOSPITAL_COMMUNITY): Payer: Self-pay

## 2010-10-22 ENCOUNTER — Encounter: Payer: Medicare Other | Admitting: Oncology

## 2010-10-22 DIAGNOSIS — C090 Malignant neoplasm of tonsillar fossa: Secondary | ICD-10-CM

## 2010-10-22 DIAGNOSIS — D649 Anemia, unspecified: Secondary | ICD-10-CM

## 2010-10-22 DIAGNOSIS — N289 Disorder of kidney and ureter, unspecified: Secondary | ICD-10-CM

## 2010-10-28 NOTE — Letter (Signed)
Summary: Christia Reading MD/[facility]  Christia Reading MD/[facility]   Imported By: Lester [place] 10/21/2010 07:41:41  _____________________________________________________________________  External Attachment:    Type:   Image     Comment:   External Document

## 2010-11-18 ENCOUNTER — Telehealth (INDEPENDENT_AMBULATORY_CARE_PROVIDER_SITE_OTHER): Payer: Self-pay | Admitting: *Deleted

## 2010-11-23 NOTE — Progress Notes (Signed)
Summary: sooner appt  Phone Note Call from Patient   Caller: SPOuse/KAREN Call For: SOOD Summary of Call: Patients wife Clydie Braun phoned stated that they were to follow up after he completed his throat cancer treatments to get his BiPAP back. They need an afternoon appointment the first available is not until 4/17 and they need to be seen sooner than that. They can be reached at [phone]  Initial call taken by: Vedia Coffer,  November 18, 2010 9:51 AM  Follow-up for Phone Call        Pt's spouse aware there are no sooner appts than March 23rd and they will keep that appt date and time.Michel Bickers CMA  November 18, 2010 10:18 AM

## 2010-11-24 ENCOUNTER — Encounter: Payer: Self-pay | Admitting: Pulmonary Disease

## 2010-11-24 LAB — URINE MICROSCOPIC-ADD ON

## 2010-11-24 LAB — CBC
HCT: 31.8 % — ABNORMAL LOW (ref 39.0–52.0)
HCT: 35.9 % — ABNORMAL LOW (ref 39.0–52.0)
HCT: 36 % — ABNORMAL LOW (ref 39.0–52.0)
HCT: 36.7 % — ABNORMAL LOW (ref 39.0–52.0)
HCT: 37 % — ABNORMAL LOW (ref 39.0–52.0)
Hemoglobin: 11.2 g/dL — ABNORMAL LOW (ref 13.0–17.0)
Hemoglobin: 12.2 g/dL — ABNORMAL LOW (ref 13.0–17.0)
Hemoglobin: 12.8 g/dL — ABNORMAL LOW (ref 13.0–17.0)
Hemoglobin: 13.6 g/dL (ref 13.0–17.0)
Hemoglobin: 13.8 g/dL (ref 13.0–17.0)
MCH: 30.9 pg (ref 26.0–34.0)
MCH: 30.9 pg (ref 26.0–34.0)
MCH: 31.6 pg (ref 26.0–34.0)
MCH: 32.1 pg (ref 26.0–34.0)
MCH: 32.3 pg (ref 26.0–34.0)
MCHC: 34.6 g/dL (ref 30.0–36.0)
MCHC: 34.9 g/dL (ref 30.0–36.0)
MCHC: 35.2 g/dL (ref 30.0–36.0)
MCHC: 35.5 g/dL (ref 30.0–36.0)
MCHC: 35.8 g/dL (ref 30.0–36.0)
MCHC: 36.8 g/dL — ABNORMAL HIGH (ref 30.0–36.0)
MCV: 86.3 fL (ref 78.0–100.0)
MCV: 88.1 fL (ref 78.0–100.0)
MCV: 92 fL (ref 78.0–100.0)
MCV: 92.1 fL (ref 78.0–100.0)
MCV: 93.3 fL (ref 78.0–100.0)
Platelets: 133 10*3/uL — ABNORMAL LOW (ref 150–400)
Platelets: 133 K/uL — ABNORMAL LOW (ref 150–400)
Platelets: 148 10*3/uL — ABNORMAL LOW (ref 150–400)
Platelets: 153 10*3/uL (ref 150–400)
Platelets: 194 10*3/uL (ref 150–400)
Platelets: 215 10*3/uL (ref 150–400)
Platelets: 240 10*3/uL (ref 150–400)
Platelets: 252 K/uL (ref 150–400)
RBC: 3.46 MIL/uL — ABNORMAL LOW (ref 4.22–5.81)
RBC: 3.84 MIL/uL — ABNORMAL LOW (ref 4.22–5.81)
RBC: 3.99 MIL/uL — ABNORMAL LOW (ref 4.22–5.81)
RBC: 4.28 MIL/uL (ref 4.22–5.81)
RDW: 12.4 % (ref 11.5–15.5)
RDW: 12.6 % (ref 11.5–15.5)
RDW: 12.8 % (ref 11.5–15.5)
RDW: 13.1 % (ref 11.5–15.5)
RDW: 13.1 % (ref 11.5–15.5)
WBC: 3.4 K/uL — ABNORMAL LOW (ref 4.0–10.5)
WBC: 7.4 10*3/uL (ref 4.0–10.5)
WBC: 7.6 10*3/uL (ref 4.0–10.5)
WBC: 8 10*3/uL (ref 4.0–10.5)
WBC: 8 K/uL (ref 4.0–10.5)
WBC: 8.1 10*3/uL (ref 4.0–10.5)
WBC: 9.7 10*3/uL (ref 4.0–10.5)

## 2010-11-24 LAB — RENAL FUNCTION PANEL
Albumin: 3.2 g/dL — ABNORMAL LOW (ref 3.5–5.2)
BUN: 79 mg/dL — ABNORMAL HIGH (ref 6–23)
CO2: 35 meq/L — ABNORMAL HIGH (ref 19–32)
Calcium: 8.8 mg/dL (ref 8.4–10.5)
Chloride: 81 meq/L — ABNORMAL LOW (ref 96–112)
Creatinine, Ser: 4.48 mg/dL — ABNORMAL HIGH (ref 0.4–1.5)
GFR calc non Af Amer: 13 mL/min — ABNORMAL LOW
Glucose, Bld: 113 mg/dL — ABNORMAL HIGH (ref 70–99)
Phosphorus: 5.4 mg/dL — ABNORMAL HIGH (ref 2.3–4.6)
Potassium: 4.1 meq/L (ref 3.5–5.1)
Sodium: 128 meq/L — ABNORMAL LOW (ref 135–145)

## 2010-11-24 LAB — DIFFERENTIAL
Basophils Relative: 0 % (ref 0–1)
Eosinophils Absolute: 0.1 10*3/uL (ref 0.0–0.7)
Eosinophils Absolute: 0.1 10*3/uL (ref 0.0–0.7)
Eosinophils Relative: 2 % (ref 0–5)
Monocytes Absolute: 0.7 10*3/uL (ref 0.1–1.0)
Monocytes Absolute: 0.8 10*3/uL (ref 0.1–1.0)
Monocytes Relative: 19 % — ABNORMAL HIGH (ref 3–12)
Neutro Abs: 2.2 10*3/uL (ref 1.7–7.7)
Neutrophils Relative %: 84 % — ABNORMAL HIGH (ref 43–77)

## 2010-11-24 LAB — BASIC METABOLIC PANEL
BUN: 69 mg/dL — ABNORMAL HIGH (ref 6–23)
BUN: 75 mg/dL — ABNORMAL HIGH (ref 6–23)
BUN: 79 mg/dL — ABNORMAL HIGH (ref 6–23)
BUN: 80 mg/dL — ABNORMAL HIGH (ref 6–23)
CO2: 32 mEq/L (ref 19–32)
CO2: 33 mEq/L — ABNORMAL HIGH (ref 19–32)
CO2: 33 mEq/L — ABNORMAL HIGH (ref 19–32)
CO2: 34 mEq/L — ABNORMAL HIGH (ref 19–32)
Calcium: 8 mg/dL — ABNORMAL LOW (ref 8.4–10.5)
Calcium: 8.4 mg/dL (ref 8.4–10.5)
Calcium: 8.5 mg/dL (ref 8.4–10.5)
Calcium: 8.7 mg/dL (ref 8.4–10.5)
Chloride: 80 mEq/L — ABNORMAL LOW (ref 96–112)
Chloride: 82 mEq/L — ABNORMAL LOW (ref 96–112)
Chloride: 88 mEq/L — ABNORMAL LOW (ref 96–112)
Creatinine, Ser: 3.44 mg/dL — ABNORMAL HIGH (ref 0.4–1.5)
Creatinine, Ser: 4.18 mg/dL — ABNORMAL HIGH (ref 0.4–1.5)
Creatinine, Ser: 4.33 mg/dL — ABNORMAL HIGH (ref 0.4–1.5)
Creatinine, Ser: 4.34 mg/dL — ABNORMAL HIGH (ref 0.4–1.5)
Creatinine, Ser: 4.93 mg/dL — ABNORMAL HIGH (ref 0.4–1.5)
Creatinine, Ser: 5.15 mg/dL — ABNORMAL HIGH (ref 0.4–1.5)
Creatinine, Ser: 5.5 mg/dL — ABNORMAL HIGH (ref 0.4–1.5)
GFR calc Af Amer: 14 mL/min — ABNORMAL LOW (ref >60)
GFR calc Af Amer: 14 mL/min — ABNORMAL LOW (ref >60)
GFR calc Af Amer: 17 mL/min — ABNORMAL LOW (ref >60)
GFR calc non Af Amer: 12 mL/min — ABNORMAL LOW (ref >60)
GFR calc non Af Amer: 14 mL/min — ABNORMAL LOW (ref >60)
GFR calc non Af Amer: 14 mL/min — ABNORMAL LOW (ref >60)
Glucose, Bld: 102 mg/dL — ABNORMAL HIGH (ref 70–99)
Glucose, Bld: 113 mg/dL — ABNORMAL HIGH (ref 70–99)
Glucose, Bld: 93 mg/dL (ref 70–99)
Glucose, Bld: 96 mg/dL (ref 70–99)
Glucose, Bld: 97 mg/dL (ref 70–99)
Potassium: 3.2 mEq/L — ABNORMAL LOW (ref 3.5–5.1)
Potassium: 3.3 mEq/L — ABNORMAL LOW (ref 3.5–5.1)
Potassium: 3.9 mEq/L (ref 3.5–5.1)
Potassium: 5 mEq/L (ref 3.5–5.1)
Sodium: 126 mEq/L — ABNORMAL LOW (ref 135–145)

## 2010-11-24 LAB — BASIC METABOLIC PANEL WITH GFR
BUN: 70 mg/dL — ABNORMAL HIGH (ref 6–23)
BUN: 81 mg/dL — ABNORMAL HIGH (ref 6–23)
CO2: 36 meq/L — ABNORMAL HIGH (ref 19–32)
CO2: 37 meq/L — ABNORMAL HIGH (ref 19–32)
Calcium: 9 mg/dL (ref 8.4–10.5)
Calcium: 9.5 mg/dL (ref 8.4–10.5)
Chloride: 87 meq/L — ABNORMAL LOW (ref 96–112)
Chloride: 92 meq/L — ABNORMAL LOW (ref 96–112)
Creatinine, Ser: 3.13 mg/dL — ABNORMAL HIGH (ref 0.4–1.5)
Creatinine, Ser: 3.86 mg/dL — ABNORMAL HIGH (ref 0.4–1.5)
GFR calc non Af Amer: 16 mL/min — ABNORMAL LOW
GFR calc non Af Amer: 20 mL/min — ABNORMAL LOW
Glucose, Bld: 119 mg/dL — ABNORMAL HIGH (ref 70–99)
Glucose, Bld: 119 mg/dL — ABNORMAL HIGH (ref 70–99)
Potassium: 4.4 meq/L (ref 3.5–5.1)
Potassium: 5.3 meq/L — ABNORMAL HIGH (ref 3.5–5.1)
Sodium: 134 meq/L — ABNORMAL LOW (ref 135–145)
Sodium: 136 meq/L (ref 135–145)

## 2010-11-24 LAB — PROTEIN ELECTROPH W RFLX QUANT IMMUNOGLOBULINS
Alpha-2-Globulin: 14.9 % — ABNORMAL HIGH (ref 7.1–11.8)
Beta 2: 7 % — ABNORMAL HIGH (ref 3.2–6.5)
Beta Globulin: 6.1 % (ref 4.7–7.2)
Gamma Globulin: 13.2 % (ref 11.1–18.8)
M-Spike, %: NOT DETECTED g/dL

## 2010-11-24 LAB — COMPREHENSIVE METABOLIC PANEL
ALT: 36 U/L (ref 0–53)
ALT: 39 U/L (ref 0–53)
AST: 27 U/L (ref 0–37)
Albumin: 3.5 g/dL (ref 3.5–5.2)
Alkaline Phosphatase: 129 U/L — ABNORMAL HIGH (ref 39–117)
Alkaline Phosphatase: 131 U/L — ABNORMAL HIGH (ref 39–117)
BUN: 73 mg/dL — ABNORMAL HIGH (ref 6–23)
CO2: 29 mEq/L (ref 19–32)
Chloride: 79 mEq/L — ABNORMAL LOW (ref 96–112)
GFR calc Af Amer: 12 mL/min — ABNORMAL LOW (ref >60)
GFR calc non Af Amer: 10 mL/min — ABNORMAL LOW (ref >60)
Glucose, Bld: 147 mg/dL — ABNORMAL HIGH (ref 70–99)
Potassium: 3.4 mEq/L — ABNORMAL LOW (ref 3.5–5.1)
Potassium: 3.9 mEq/L (ref 3.5–5.1)
Sodium: 123 mEq/L — ABNORMAL LOW (ref 135–145)
Sodium: 124 mEq/L — ABNORMAL LOW (ref 135–145)
Total Bilirubin: 0.9 mg/dL (ref 0.3–1.2)

## 2010-11-24 LAB — MAGNESIUM
Magnesium: 1.5 mg/dL (ref 1.5–2.5)
Magnesium: 1.7 mg/dL (ref 1.5–2.5)
Magnesium: 1.9 mg/dL (ref 1.5–2.5)

## 2010-11-24 LAB — UIFE/LIGHT CHAINS/TP QN, 24-HR UR
Albumin, U: DETECTED
Alpha 1, Urine: DETECTED — AB
Gamma Globulin, Urine: DETECTED — AB

## 2010-11-24 LAB — COMPREHENSIVE METABOLIC PANEL WITH GFR
ALT: 23 U/L (ref 0–53)
AST: 18 U/L (ref 0–37)
Albumin: 3.1 g/dL — ABNORMAL LOW (ref 3.5–5.2)
Alkaline Phosphatase: 111 U/L (ref 39–117)
BUN: 82 mg/dL — ABNORMAL HIGH (ref 6–23)
CO2: 36 meq/L — ABNORMAL HIGH (ref 19–32)
Calcium: 8.8 mg/dL (ref 8.4–10.5)
Chloride: 82 meq/L — ABNORMAL LOW (ref 96–112)
Creatinine, Ser: 4.14 mg/dL — ABNORMAL HIGH (ref 0.4–1.5)
GFR calc non Af Amer: 15 mL/min — ABNORMAL LOW
Glucose, Bld: 98 mg/dL (ref 70–99)
Potassium: 4 meq/L (ref 3.5–5.1)
Sodium: 130 meq/L — ABNORMAL LOW (ref 135–145)
Total Bilirubin: 0.8 mg/dL (ref 0.3–1.2)
Total Protein: 6.7 g/dL (ref 6.0–8.3)

## 2010-11-24 LAB — URINALYSIS, ROUTINE W REFLEX MICROSCOPIC
Glucose, UA: 100 mg/dL — AB
Leukocytes, UA: NEGATIVE
Protein, ur: 100 mg/dL — AB
Specific Gravity, Urine: 1.012 (ref 1.005–1.030)
Urobilinogen, UA: 0.2 mg/dL (ref 0.0–1.0)

## 2010-11-24 LAB — SODIUM, URINE, RANDOM: Sodium, Ur: 90 mEq/L

## 2010-11-24 LAB — CREATININE, URINE, RANDOM: Creatinine, Urine: 31 mg/dL

## 2010-11-24 LAB — PHOSPHORUS
Phosphorus: 5.1 mg/dL — ABNORMAL HIGH (ref 2.3–4.6)
Phosphorus: 5.4 mg/dL — ABNORMAL HIGH (ref 2.3–4.6)
Phosphorus: 5.5 mg/dL — ABNORMAL HIGH (ref 2.3–4.6)
Phosphorus: 5.7 mg/dL — ABNORMAL HIGH (ref 2.3–4.6)

## 2010-11-24 LAB — CORTISOL: Cortisol, Plasma: 9.6 ug/dL

## 2010-11-25 LAB — APTT: aPTT: 31 seconds (ref 24–37)

## 2010-11-25 LAB — GLUCOSE, CAPILLARY: Glucose-Capillary: 105 mg/dL — ABNORMAL HIGH (ref 70–99)

## 2010-11-25 LAB — PROTIME-INR
INR: 1 (ref 0.00–1.49)
Prothrombin Time: 13.4 seconds (ref 11.6–15.2)

## 2010-12-03 ENCOUNTER — Encounter: Payer: Self-pay | Admitting: Pulmonary Disease

## 2010-12-03 ENCOUNTER — Ambulatory Visit (INDEPENDENT_AMBULATORY_CARE_PROVIDER_SITE_OTHER): Payer: Medicare Other | Admitting: Pulmonary Disease

## 2010-12-03 VITALS — BP 120/70 | HR 54 | Temp 98.2°F | Ht 69.0 in | Wt 288.4 lb

## 2010-12-03 DIAGNOSIS — G4733 Obstructive sleep apnea (adult) (pediatric): Secondary | ICD-10-CM

## 2010-12-03 NOTE — Progress Notes (Signed)
  Subjective:    Patient ID: Wesley [name], male    DOB: 1946/04/12, 65 y.o.   MRN: [id number]  HPI 65 yo male with hx of OSA/OHS.  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  He has lost about 130 lbs since starting chemo.  He has no taste or smell.  He feels tired and nods off during the day.  He still snores.  He is not using BPAP or oxygen at present.    Review of Systems     Objective:   Physical Exam  Constitutional: He is oriented to person, place, and time. He appears well-nourished. No distress.  HENT:  Mouth/Throat: No oropharyngeal exudate.  Eyes: EOM are normal. Pupils are equal, round, and reactive to light.  Cardiovascular: Normal rate, regular rhythm and normal heart sounds.   Pulmonary/Chest: Effort normal and breath sounds normal.  Abdominal: Soft. Bowel sounds are normal.  Musculoskeletal: He exhibits no edema.  Lymphadenopathy:    He has no cervical adenopathy.  Neurological: He is alert and oriented to person, place, and time. No cranial nerve deficit.  Skin: Skin is warm and dry.          Assessment & Plan:   OBSTRUCTIVE SLEEP APNEA He has previous history of OSA and OHS.  He has since lost a significant amount of weight, but still has symptoms of sleep disruption and daytime sleepiness.  Will repeat his sleep test to further assess whether he still has sleep apnea, and determine what therapy options there are.    Updated Medication List Outpatient Encounter Prescriptions as of 12/03/2010  Medication Sig Dispense Refill  . amLODipine (NORVASC) 10 MG tablet Take 10 mg by mouth daily.        [name] aspirin 81 MG tablet Take 81 mg by mouth daily.        . calcium acetate (PHOSLO) 667 MG capsule 1 tablet 3 times a day      . Cholecalciferol (VITAMIN D3) 1000 UNITS CAPS Take by mouth. Once a day       . citalopram (CELEXA) 40 MG tablet Take 20 mg by mouth daily.       [name] galantamine (RAZADYNE ER) 16 MG 24 hr capsule Take 16 mg by mouth  daily.        . metoprolol (LOPRESSOR) 50 MG tablet 1 twice a day      . omeprazole (PRILOSEC) 20 MG capsule 1 tablet twice a day      . pantoprazole (PROTONIX) 40 MG tablet Take 40 mg by mouth daily.        . QUEtiapine (SEROQUEL) 25 MG tablet Take 25 mg by mouth 2 (two) times daily.        . simvastatin (ZOCOR) 20 MG tablet 1/2 tablet daily       . Tamsulosin HCl (FLOMAX) 0.4 MG CAPS Once a day      . benazepril-hydrochlorthiazide (LOTENSIN HCT) 20-12.5 MG per tablet Take 1 tablet by mouth 2 (two) times daily.        . carbamazepine (TEGRETOL) 200 MG tablet 1 1/2 by mouth three times day       . clonazePAM (KLONOPIN) 0.5 MG tablet Take 0.5 mg by mouth 2 (two) times daily.        . Multiple Vitamin (MULTIVITAMIN) tablet Take 1 tablet by mouth daily.

## 2010-12-03 NOTE — Patient Instructions (Signed)
Will schedule sleep test at home

## 2010-12-03 NOTE — Assessment & Plan Note (Signed)
He has previous history of OSA and OHS.  He has since lost a significant amount of weight, but still has symptoms of sleep disruption and daytime sleepiness.  Will repeat his sleep test to further assess whether he still has sleep apnea, and determine what therapy options there are.

## 2010-12-13 ENCOUNTER — Encounter: Payer: Self-pay | Admitting: Pulmonary Disease

## 2010-12-13 ENCOUNTER — Ambulatory Visit (HOSPITAL_BASED_OUTPATIENT_CLINIC_OR_DEPARTMENT_OTHER): Payer: Medicare Other | Attending: Pulmonary Disease

## 2010-12-13 DIAGNOSIS — C760 Malignant neoplasm of head, face and neck: Secondary | ICD-10-CM | POA: Insufficient documentation

## 2010-12-13 DIAGNOSIS — E669 Obesity, unspecified: Secondary | ICD-10-CM | POA: Insufficient documentation

## 2010-12-13 DIAGNOSIS — G4733 Obstructive sleep apnea (adult) (pediatric): Secondary | ICD-10-CM

## 2010-12-13 DIAGNOSIS — I509 Heart failure, unspecified: Secondary | ICD-10-CM | POA: Insufficient documentation

## 2010-12-13 DIAGNOSIS — Z6841 Body Mass Index (BMI) 40.0 and over, adult: Secondary | ICD-10-CM | POA: Insufficient documentation

## 2010-12-31 NOTE — Procedures (Addendum)
NAME:  Wesley [name], Wesley [name] NO.:  [id number]  MEDICAL RECORD NO.:  [id number]          PATIENT TYPE:  OUT  LOCATION:  SLEEP CENTER                 FACILITY:  [facility]  PHYSICIAN:  Coralyn Helling, MD        DATE OF BIRTH:  04-Feb-1946  DATE OF STUDY:  12/13/2010                           NOCTURNAL POLYSOMNOGRAM  REFERRING PHYSICIAN:  Maribelle Hopple  INDICATION FOR STUDY:  Wesley [name] is a 65 year old male who has a history of congestive heart failure, sleep apnea, and obesity hypoventilation syndrome.  He was recently diagnosed with head and neck cancer and had undergone therapy for this.  He had a significant amount of weight loss related to his cancer therapy.  He is referred to the Sleep Lab to further assess the presence and severity of his sleep disordered breathing.  Height is 5 feet and 10 inches, weight is 350 pounds, BMI is 50.  Neck size is 18.  EPWORTH SLEEPINESS SCORE:  17.  MEDICATIONS:  Vitamin D, Flomax, omeprazole, amlodipine, Lopressor, Seroquel, galantamine, citalopram, simvastatin, zolpidem, and prazosin.  SLEEP ARCHITECTURE:  The patient followed a split night study protocol. During the diagnostic portion of the study, total recording time was 241 minutes.  Total sleep time was 149 minutes.  Sleep efficiency was 63%. Sleep latency was 30 minutes.  This portion of study was notable for the lack of stage III and REM sleep.  The patient slept in both supine and non-supine positions.  During the titration portion of the study, total recording time was 216 minutes.  Total sleep time was 187 minutes.  Sleep efficiency was 87%. Sleep latency was 1 minute.  REM latency was 18.5 minutes.  This portion of the study was notable for the lack of stage III sleep and the patient slept in both the supine and non-supine positions.  RESPIRATORY DATA:  The average respiratory rate was 18.  During the diagnostic portion of the study, loud snoring was noted by  the technician.  The overall apnea/hypopnea index was 95.5.  The events were exclusively obstructive in nature.  The supine apnea-hypopnea index was 99.  The non-supine apnea/hypopnea copy index was 91.  During the titration portion of the study, the patient was started on CPAP of 7 and titrated up to 17 cm of water.  With CPAP set at 17 cm of water, the apnea/hypopnea index was reduced to 3.5.  However, at this pressure setting, the patient was not observed in REM sleep or supine sleep.  OXYGEN DATA:  CARDIAC DATA:  The average heart rate was 57 and the rhythm strip showed sinus rhythm with PVCs and PACs.  MOVEMENT-PARASOMNIA:  The periodic limb movement index was 0 and the patient had 5 restroom trips.  IMPRESSIONS-RECOMMENDATIONS:  This study shows evidence for severe obstructive sleep apnea with an apnea/hypopnea index was 95.5.  He was titrated to CPAP of 17 cm of water with improvement in his sleep disordered breathing and his oxygenation.  However, he was not observed in REM sleep or supine sleep at this pressure setting.  In addition to diet, exercise, and weight reduction, I would recommend the patient be started on CPAP of 17  cm of water.  He should then undergo an overnight oximetry as an outpatient.  Depending upon this and his clinical response, additional adjustments in his pressure setting may need to be done.  Alternatively, he may also require the use of BiPAP.     Coralyn Helling, MD Diplomat, American Board of Sleep Medicine Electronically Signed    VS/MEDQ  D:  12/30/2010 11:05:21  T:  12/31/2010 00:16:23  Job:  [id number]

## 2011-01-03 ENCOUNTER — Encounter: Payer: Self-pay | Admitting: Pulmonary Disease

## 2011-01-03 DIAGNOSIS — E669 Obesity, unspecified: Secondary | ICD-10-CM

## 2011-01-03 DIAGNOSIS — G4733 Obstructive sleep apnea (adult) (pediatric): Secondary | ICD-10-CM

## 2011-01-03 DIAGNOSIS — I509 Heart failure, unspecified: Secondary | ICD-10-CM

## 2011-01-03 DIAGNOSIS — C760 Malignant neoplasm of head, face and neck: Secondary | ICD-10-CM

## 2011-01-03 NOTE — Assessment & Plan Note (Signed)
His sleep test from 12/13/10 showed severe OSA.  He did well with CPAP 17 cm H2O, but was not observed in REM or supine sleep.  Will arrange for CPAP 17 cm H2O, and then have ONO done on this set up.  Plan d/w patients wife over the phone.

## 2011-01-05 ENCOUNTER — Encounter (HOSPITAL_BASED_OUTPATIENT_CLINIC_OR_DEPARTMENT_OTHER): Payer: Medicare Other

## 2011-01-05 NOTE — Progress Notes (Signed)
Thanks

## 2011-01-19 ENCOUNTER — Telehealth: Payer: Self-pay | Admitting: Pulmonary Disease

## 2011-01-19 NOTE — Telephone Encounter (Signed)
I spoke with Synetta Fail and Greggory Stallion at [facility] and they say [facility] will not pay for pt to switch back to cpap after having paid for Bipap. They can have t his reviewed if thee is a new sleep study and documentation at OV that the pt has had significant changes , such as, excessive weight loss, etc... Per OV note from 12/03/2010 the pt has had weight loss and new study wwas done on 4/2/202. [facility] is asking that we refax the new study (some of the pages did not come through clearly) and a copy of the last OV note stating the weight loss. Will forward to PCC's so they may follow-up on this matter.

## 2011-01-20 ENCOUNTER — Encounter: Payer: Self-pay | Admitting: Pulmonary Disease

## 2011-01-21 NOTE — Telephone Encounter (Signed)
Dr v do you want to put him back on bipap per medicare guidlines

## 2011-01-24 ENCOUNTER — Encounter: Payer: Self-pay | Admitting: Pulmonary Disease

## 2011-01-25 NOTE — Procedures (Signed)
NAME:  Wesley [name], Wesley [name]               ACCOUNT NO.:  [id number]   MEDICAL RECORD NO.:  [id number]          PATIENT TYPE:  OUT   LOCATION:  SLEEP CENTER                 FACILITY:  [facility]   PHYSICIAN:  Clinton D. Maple Hudson, MD, FCCP, FACPDATE OF BIRTH:  01-[birth year]-45   DATE OF STUDY:  04/17/2007                            NOCTURNAL POLYSOMNOGRAM   REFERRING PHYSICIAN:  Ranelle Oyster, M.D.   INDICATION FOR STUDY:  Hypersomnia with sleep apnea.   EPWORTH SLEEPINESS SCORE:  Is 17/24, BMI 45, weight 315 pounds.   MEDICATIONS:  Are listed and reviewed.   A diagnostic NPSG was ordered but the patient qualified for conversion  to a split study protocol, because of significant oxygen desaturation.   SLEEP ARCHITECTURE:  Total sleep time 387 minutes, sleep efficiency 87%.  Stage 1 was 5%, stage 2 was 85%, stage 3 absent.  REM 10% of total sleep  time.  Sleep latency 13 minutes, REM latency 343 minutes.  Awake after  sleep onset 42 minutes.  Arousal index 14.7.  Home medications were  taken before arrival.  Rolaids were taken at 0114 a.m.   RESPIRATORY DATA:  Split study protocol.  Apnea/hypopnea index (AHI,  RDI).  There were 105.1 obstructive events per hours, indicating severe  obstructive sleep apnea/hypopnea syndrome before CPAP.  This included  four obstructive apneas, one mixed apnea and 228 hypopneas.  Events were  strongly but not exclusively positional, mostly associated with supine  sleep position.  REM AHI 28.8.  Criteria were met for  conversion to a  split study protocol and CPAP was titrated to 16 CWP, AHI 1.9 per hour.  A large Res Med Ultra-Mirage full-face mask was used with heated  humidifier.   OXYGEN DATA:  Very loud snoring with oxygen desaturation to a Nadir of  73%, before CPAP.  After CPAP control, oxygen saturation maintained  around 91% on room air.   CARDIAC DATA:  Sinus rhythm with an eight-beat self-limited run of  paroxysmal ventricular tachycardia during REM.   MOVEMENT-PARASOMNIA:  Occasional limb jerk, insignificant.  Bathroom x1.   IMPRESSIONS-RECOMMENDATIONS:  1. Severe obstructive sleep apnea/hypopnea syndrome, AHI 105.1 per      hour with events more common while supine.  Very loud snoring with      oxygen desaturation to a Nadir of 73%.  2. Successful continuous positive air pressure titration to 16 cm of      water, AHI 1.9 per hour.  A large ResMed Ultra-Mirage full-face      mask was used with heated humidifier.  3. Severe sustained oxygen desaturation to range of 73%-88%, was      addressed by the addition of supplemental oxygen at 1.5 liters per      minute added at 2:15 a.m.  Subsequently continuous positive air      pressure reached a target titration.  Oxygen was continued but may      no longer be necessary.  Suggest initiation of continuous positive      air pressure at home set at 16 cm of water with an early overnight      oximetry check while wearing continuous positive air pressure  on      room air scheduled to follow, so that the need for supplemental      oxygen can be assessed.  4. Note an eight-beat run of paroxysmal ventricular tachycardia.      Clinton D. Maple Hudson, MD, [facility], FACP  Diplomate, Biomedical engineer of Sleep Medicine  Electronically Signed     CDY/MEDQ  D:  04/21/2007 10:52:05  T:  04/22/2007 11:33:17  Job:  [id number]

## 2011-01-25 NOTE — Assessment & Plan Note (Signed)
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  We resumed Tegretol and increased his Klonopin.  His wife has noted some difference but not dramatic.  He still will flap  the handle easily.  He is willing to get into the lymphedema program at  [facility], although the wait may be a few months still.  He is on  the [employer] program and has lost 20 pounds apparently.  He is  wearing his stockings.  He is trying to stay active with exercises as  much as he can.  He rates his pain at a 5/10 still.  His sleep can be  poor and he has nightmares.  He has a psychology followup.  He was down  to [facility] for psychiatry followup visit soon as well.   REVIEW OF SYSTEMS:  Notable for trouble walking, confusion, and anxiety.  Other pertinent positives are as above.  Full review is written in the  health and history section of the chart.   SOCIAL HISTORY:  As noted above.  He lives with his wife, who remains  supportive.  I am not sure if his DUI case is still pending.   PHYSICAL EXAMINATION:  Blood pressure is 171/82, pulse is 83,  respiratory rate 18, and he is sating 94% on room air.  His weight does  appear to be down a bit  The patient is pleasant.  He continues to be verbose  and tangential,  although he is less aggressive with his behavior today.  Overall, he is  more pleasant.  He is to display problems with short-term memory.  He is  wearing his knee-high compression stockings.  No break down was seen.  HEART:  Regular.  CHEST:  Clear.  SKIN:  Warm and pink.  Edema is 1+ to 2+, right greater than the left.  Multiple scars are noted.  NEUROLOGIC:  Gait seems to be improved although, he remains wide based,  somewhat antalgic to the right.   ASSESSMENT:  1. Status post polytrauma and traumatic brain injury.  2. History of posttraumatic stress disorder.  3. Emotional dyscontrol syndrome.  4. Obstructive sleep apnea.  5. 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  6. Chronic lower extremity edema.   PLAN:  1. We will increase Tegretol to 200 mg b.i.d. for a week then t.i.d.      thereafter.  2. Keep Klonopin the same.  3. Continue neuropsychological counseling.  To follow up with      Psychiatry as well.  4. Refilled Ritalin today with 2 prescriptions.  Maintain Seroquel,      klonopin and Aricept at the  current dosing.  I think he has made      improvements since I last saw him.  However, he will likely need to      remain on some type of medications to help modify his behavior.  5. Continue exercise and diet measures.  6. I will see him back in 2 months.      Ranelle Oyster, M.D.  Electronically Signed    ZTS/MedQ  D:  05/26/2008 12:20:46  T:  05/27/2008 07:18:55  Job #:  [id number]

## 2011-01-25 NOTE — Assessment & Plan Note (Signed)
Wesley [name] is back regarding his emotional dyscontrol syndrome and  lymphedema.  He seems to be doing on the current regimen which includes  Tegretol 200 mg 1-1/2 t.i.d., Klonopin which is now 0.5 b.i.d., Seroquel  which is 25 mg 1 b.i.d.  Aricept has been changed to Razadyne which he  takes at bedtime.  He uses Ritalin as needed.  Wife states that he has  been doing fairly well, and his mood has been much more controlled.  His  pain is 4-8/10 and interferes with general activity, relations with  others, and enjoyment of life on a mild-to-moderate level.  He uses a  cane for balance when he walks.  His sleep is still poor and affected by  his sleep apnea.  He is working on a CPAP machine and the mask fitting.   REVIEW OF SYSTEMS:  Notable for the above.  Full 14-point review is in  the written health and history section of the chart.   Social history is unchanged.   PHYSICAL EXAMINATION:  Blood pressure is 211/101, pulse is 86,  respiratory rate 18, sating 91% on room air.  Rechecked blood pressure  of 187/97.  The patient was generally pleasant in no acute distress.  He  is still limping favoring the right leg and using his cane for balance.  He was slightly short of breath with activation.  He was wearing his  compression stockings today and seemed to have fair control of his legs  in the 1+ range.  Heart was regular rhythm.  Chest was clear.  Abdomen  was soft, nontender.  He remains morbidly obese.  Thought processing was  not dissimilar from prior visits as he is very tangential at times.   ASSESSMENT:  1. 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  2. Posttraumatic stress disorder.  3. Obstructive sleep apnea.  4. Chronic lower extremity edema.  5. Shortness of breath.   PLAN:  1. I insisted the patient needs to follow up with his local MD      regarding his shortness of breath and hypoxia.  Sats today dropped      to 85% after exertion.  2. We  will stay with his current regimen as above.  This seems to be      working well for him.  3. The patient needs fitting and use of his CPAP machine.  4. Discussed appropriate exercise and dietary goals with the patient.      He really needs to lose a lot of weight for a multitude of reasons.  5. I will see him back in about 6 months' time.      Ranelle Oyster, M.D.  Electronically Signed     ZTS/MedQ  D:  02/23/2009 13:32:00  T:  02/24/2009 03:18:53  Job #:  [id number]

## 2011-01-25 NOTE — Assessment & Plan Note (Signed)
Wesley [name] is back regarding his emotional dyscontrol syndrome and  lymphedema.  The change is in the Tegretol have helped as well as  Klonopin.  He is on 300 mg t.i.d. of Tegretol all 1.5 mg of Klonopin  b.i.d.  Wife notes a the big change.  The patient seems to have some  better insight about his behavior now for whatever reason.  He has been  getting some lymphedema management and this has helped quite a bit with  his legs and states he has lost about 10-15 pounds off either leg.  He  wants to get back into more exercise now and aquatic-type activities.  The patient rates his pain at 2/10.  The patient's pain was constant and  achy.  Pain interferes with general activity, relations with others,  enjoyment of life in a moderate level.  Sleep is fair to good.  He still  has some problems with his PTSD.   REVIEW OF SYSTEMS:  Notable for the above.  Full 14-point review is in  the written health and history section of the chart.   Social history is unchanged.  He is living with his wife who remains  devoted.   PHYSICAL EXAMINATION:  Blood pressure is 189/88, pulse is 104,  respiratory rate 18.  He is sating 89-93% on room air.  We walked in  today and he became short of breath after walking up and down the hall,  though, it does take him a bit of effort to walk due to his multiple  arthritides.  His legs have decreased in size with his stockings in  place.  His edema is probably in the 1+ to 2+ range.  Heart has regular  rate and rhythm.  Lungs are clear.  Abdomen is soft and nontender with  notable hernia present.  Mood wise, he has improved quite a bit;  although, he is still tangential, he was much more subdued and  appropriate with his mood control today.   ASSESSMENT:  1. Status post poly trauma traumatic brain injury.  2. Post-traumatic stress disorder.  3. Emotional dyscontrol syndrome.  4. Obstructive sleep apnea.  5. 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  6. Chronic lower extremity edema, which is multifactorial.   PLAN:  1. We discussed at length edema control, which would include continue      use of stockings, massage, wrapping, exercise, weight loss,      appropriate diet etc.  He also has an IVC filter which includes      flow.  2. We will continue Tegretol and Klonopin at current doses, although,      we will check Tegretol level as well as CBC and LFTs today.  3. We will refill Ritalin 10 mg b.i.d. with the second script for next      month.  4. Continue Aricept.  5. We will see him back with nursing in 2 months, in 4 months with me.      Ranelle Oyster, M.D.  Electronically Signed     ZTS/MedQ  D:  10/24/2008 13:14:22  T:  10/25/2008 01:59:36  Job #:  [id number]

## 2011-01-25 NOTE — Procedures (Signed)
NAME:  Wesley [name], Wesley [name] NO.:  [id number]   MEDICAL RECORD NO.:  [id number]          PATIENT TYPE:  OUT   LOCATION:  SLEEP CENTER                 FACILITY:  [facility]   PHYSICIAN:  Coralyn Helling, MD        DATE OF BIRTH:  Feb 02, 1946   DATE OF STUDY:  04/01/2009                            NOCTURNAL POLYSOMNOGRAM   REFERRING PHYSICIAN:   INDICATION FOR STUDY:  Mr. Wesley [name] is a 65 year old male who has a  previous history of obstructive sleep apnea.  He had an overnight  polysomnogram done on April 17, 2007 and it was found to have an apnea-  hypopnea index of 105.  He was tried on CPAP, but was not able to  tolerate this.  He returned to the sleep lab for a CPAP titration study.   Height is 5 feet 10 inches, weight is 350 pounds.  BMI is 50.  Neck size  is 19 inches.   EPWORTH SLEEPINESS SCORE:  19.   MEDICATIONS:  Omeprazole, amlodipine, simvastatin, citalopram,  Carbamazepine, Klonopin, aspirin, vitamin D.   SLEEP ARCHITECTURE:  Total recording time was 449 minutes.  Total sleep  time was 375 minutes.  Sleep efficiency was 83%.  Sleep latency was 2  minutes.  REM latency is 186 minutes.  The study was notable for the  lack of slow-wave sleep.  The patient slept exclusively in the supine  position.   RESPIRATORY DATA:  The average respiratory rate was 16.  Moderate  snoring was noted by the technician.  The patient was started on CPAP of  6 cm of water and titrated to CPAP 20 cm of water.  At a CPAP pressure  setting of 20 cm of water the patient was observed in REM sleep and  supine sleep.  However, his apnea-hypopnea index and CPAP 20 cm of water  was still elevated at 17 and, he still had episodes of snoring.   OXYGEN DATA:  The study was conducted with the use of 3 liters  supplemental oxygen.  The baseline oxygenation was 92%.  The oxygen  saturation nadir was 66%.  With CPAP of 20 cm of water the oxygen  saturation was 80%.  Of note is that he continued to  have his most  significant episodes of oxygen desaturation during REM sleep, and this  was observed with and without respiratory events.   CARDIAC DATA:  The average heart rate was 69 and the rhythm strip showed  sinus rhythm with PACs and PVCs.   MOVEMENT-PARASOMNIA:  The periodic limb movement index was 39 and the  patient had one restroom trip.   IMPRESSIONS-RECOMMENDATIONS:  This was a suboptimal CPAP titration study  as the patient continued to have significant respiratory events as well  as oxygen desaturation in spite of the use of CPAP 20 cm of water and 3  liters of supplemental oxygen.  Of note is that he had his most severe  oxygen desaturations during REM sleep which occurred both with and  without apneic respiratory events.   He had a significant increase in his periodic limb movement index.   What I would recommend is  to arrange for the patient to be started on a  auto BiPAP as he appears to have failed CPAP therapy.  I would also uses  this in conjunction with supplemental oxygen at 5 liters.  I would then  have him undergo a overnight oximetry and review his auto BiPAP  download.  If this is unsuccessful, he may need to return to the sleep  lab for a BiPAP titration study.   In addition to the above recommendations, the patient needs to be  counseled with regards to importance of diet, exercise, and weight  reduction.      Coralyn Helling, MD  Diplomat, American Board of Sleep Medicine  Electronically Signed     VS/MEDQ  D:  04/10/2009 10:01:39  T:  04/10/2009 11:57:37  Job:  [id number]
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
## 2011-01-25 NOTE — Assessment & Plan Note (Signed)
HEALTHCARE                             PULMONARY OFFICE NOTE   NAME:Wesley [name], Wesley [name]                      MRN:          [id number]  DATE:05/10/2007                            DOB:          04/18/1946    HISTORY OF PRESENT ILLNESS:  The patient is a 65 year old gentleman who  I have been asked to see for obstructive sleep apnea.  Patient underwent  nocturnal polysomnography on April 17, 2007 where he was found to have  severe obstructive sleep apnea with an apnea-hypopnea index of 105  events per hour during the first half of the night.  He was then placed  on CPAP and titrated to a final pressure of 16 cm of water pressure.  The patient states that he does have loud snoring as well as pauses in  his breathing during sleep.  He typically goes to bed between 9 and 10  and gets up at 7:30 a.m. to start his day.  He is not rested in the  mornings and has blamed this on PTSD.  The patient sells and buys  airplanes and does note significant sleepiness during the day if he sits  down for any period of time.  He will fall asleep with TV and movies, he  admits to sleepiness with driving.  Of note, the patient states that his  weight is up about 100 pounds over the last 2 years.   PAST MEDICAL HISTORY:  1. Hypertension.  2. Dyslipidemia.  3. History of lung surgery of unknown type in 1998.   CURRENT MEDICATIONS:  1. Simvastatin 20 mg one-half daily.  2. Omeprazole 20 mg daily.  3. Hydrocodone 2 b.i.d.  4. Amlodipine 10 mg daily.  5. Clonazepam 1 mg b.i.d.  6. Citalopram 40 mg daily.  7. Methylin 5 mg two daily.  8. Zolpidem 10 mg daily.   PATIENT HAS NO KNOWN DRUG ALLERGIES.   SOCIAL HISTORY:  He has never smoked.  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   FAMILY HISTORY:  The patient is adopted and therefore knows nothing  about his family history.   REVIEW OF SYSTEMS:  As per history of present illness, also see patient  intake form documented in the chart.   PHYSICAL EXAMINATION:  GENERAL:  He is a morbidly obese male in no acute  distress.  Blood pressure is 128/90, pulse 98, temperature 98, weight is  347 pounds, he is 5 foot 9 inches tall, O2 saturation on room air is  93%.  HEENT:  Pupils equal, round, reactive to light and accommodation.  Extraocular muscles are intact, nares are patent without obvious  discharge but there is mild septal deviation to the left.  Oropharynx  does show severe elongation of the soft palate and uvula with soft  tissue redundancy.  NECK:  Large and difficult to assess for JVD but there is no thyromegaly  or lymphadenopathy.  CHEST:  Totally clear.  CARDIAC:  Reveals a regular rate and rhythm with no murmurs, rubs or  gallops.  ABDOMEN:  Soft, nontender with good bowel  sounds.  Genital exam, rectal exam, breast exam was not done and not indicated.  LOWER EXTREMITY:  Shows 2+ edema to the knees, pulses are intact  distally but difficult to palpate because of the edema.  NEUROLOGICALLY:  He is alert and oriented with no obvious motor  deficits.   IMPRESSION:  Severe obstructive sleep apnea documented by nocturnal  polysomnography.  Had a long conversation with the patient about the  pathophysiology of sleep apnea including the short term quality of life  issues and the longterm cardiovascular issues.  At this point in time,  his best treatment options are weight loss coupled with continuous  positive airway pressure and he is agreeable to trying this.   PLAN:  1. Initiate CPAP at 10 cm of water pressure.  Ultimately he will need      to be titrated up to his therapeutic pressure from the sleep study.  2. Once the patient is on his therapeutic pressure, we need to do      overnight oximetry on room air to make sure he does not need      additional supplemental oxygen.  3. Work on weight loss.  4. The patient will follow up in 4 weeks or sooner if there is       problems.     Barbaraann Share, MD,FCCP  Electronically Signed    [facility]/MedQ  DD: 06/19/2007  DT: 06/20/2007  Job #: [id number]   cc:   Ranelle Oyster, M.D.
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
## 2011-01-27 ENCOUNTER — Encounter (HOSPITAL_COMMUNITY): Payer: Self-pay

## 2011-01-27 ENCOUNTER — Encounter (HOSPITAL_COMMUNITY)
Admission: RE | Admit: 2011-01-27 | Discharge: 2011-01-27 | Disposition: A | Discharge status: Home / Self Care | Payer: Medicare Other | Source: Ambulatory Visit | Attending: Radiation Oncology | Admitting: Radiation Oncology

## 2011-01-27 DIAGNOSIS — Z923 Personal history of irradiation: Secondary | ICD-10-CM | POA: Insufficient documentation

## 2011-01-27 DIAGNOSIS — K224 Dyskinesia of esophagus: Secondary | ICD-10-CM | POA: Insufficient documentation

## 2011-01-27 DIAGNOSIS — K4090 Unilateral inguinal hernia, without obstruction or gangrene, not specified as recurrent: Secondary | ICD-10-CM | POA: Insufficient documentation

## 2011-01-27 DIAGNOSIS — I2510 Atherosclerotic heart disease of native coronary artery without angina pectoris: Secondary | ICD-10-CM | POA: Insufficient documentation

## 2011-01-27 DIAGNOSIS — K449 Diaphragmatic hernia without obstruction or gangrene: Secondary | ICD-10-CM | POA: Insufficient documentation

## 2011-01-27 DIAGNOSIS — Z96649 Presence of unspecified artificial hip joint: Secondary | ICD-10-CM | POA: Insufficient documentation

## 2011-01-27 DIAGNOSIS — K219 Gastro-esophageal reflux disease without esophagitis: Secondary | ICD-10-CM | POA: Insufficient documentation

## 2011-01-27 DIAGNOSIS — M542 Cervicalgia: Secondary | ICD-10-CM | POA: Insufficient documentation

## 2011-01-27 DIAGNOSIS — Z9221 Personal history of antineoplastic chemotherapy: Secondary | ICD-10-CM | POA: Insufficient documentation

## 2011-01-27 DIAGNOSIS — K439 Ventral hernia without obstruction or gangrene: Secondary | ICD-10-CM | POA: Insufficient documentation

## 2011-01-27 DIAGNOSIS — N400 Enlarged prostate without lower urinary tract symptoms: Secondary | ICD-10-CM | POA: Insufficient documentation

## 2011-01-27 DIAGNOSIS — I517 Cardiomegaly: Secondary | ICD-10-CM | POA: Insufficient documentation

## 2011-01-27 DIAGNOSIS — C760 Malignant neoplasm of head, face and neck: Secondary | ICD-10-CM | POA: Insufficient documentation

## 2011-01-27 DIAGNOSIS — R229 Localized swelling, mass and lump, unspecified: Secondary | ICD-10-CM | POA: Insufficient documentation

## 2011-01-27 DIAGNOSIS — J9819 Other pulmonary collapse: Secondary | ICD-10-CM | POA: Insufficient documentation

## 2011-01-27 DIAGNOSIS — I7789 Other specified disorders of arteries and arterioles: Secondary | ICD-10-CM | POA: Insufficient documentation

## 2011-01-27 LAB — GLUCOSE, CAPILLARY: Glucose-Capillary: 104 mg/dL — ABNORMAL HIGH (ref 70–99)

## 2011-01-27 MED ORDER — FLUDEOXYGLUCOSE F - 18 (FDG) INJECTION
19.0000 | Freq: Once | INTRAVENOUS | Status: AC | PRN
  Administered 2011-01-27: 19 via INTRAVENOUS

## 2011-01-27 NOTE — Telephone Encounter (Signed)
Order given to [facility] TO set bipap on same level which will make it a cpap

## 2011-01-27 NOTE — Telephone Encounter (Signed)
Noted  

## 2011-01-27 NOTE — Telephone Encounter (Signed)
Can they keep his BPAP, but just have them set the EPAP and IPAP level at the same setting.  This would effectively make this a CPAP machine.

## 2011-01-28 ENCOUNTER — Ambulatory Visit
Admission: RE | Admit: 2011-01-28 | Discharge: 2011-01-28 | Disposition: A | Discharge status: Home / Self Care | Payer: Medicare Other | Source: Ambulatory Visit | Attending: Radiation Oncology | Admitting: Radiation Oncology
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
## 2011-01-28 NOTE — Discharge Summary (Signed)
[name], [name] NO.:  [id number]   MEDICAL RECORD NO.:  [id number]          PATIENT TYPE:  INP   LOCATION:  5023                         FACILITY:  [facility]   PHYSICIAN:  Nadara Mustard, MD     DATE OF BIRTH:  02/16/1946   DATE OF ADMISSION:  10/11/2006  DATE OF DISCHARGE:  10/16/2006                               DISCHARGE SUMMARY   DIAGNOSES:  1. Avascular necrosis, left hip of heterotopic ossification status      post open reduction, internal fixation.  2. Acetabular fracture dislocation of the hip.   PROCEDURE:  1. Takedown heterotopic ossification.  2. A left total hip arthroplasty with a 53-mm head, +2 neck, 60-mm      acetabulum with a #13 femur, DePuy components.   SURGEON:  Nadara Mustard, MD.   ANTIBIOTICS:  Kefzol 1 gram preoperatively and 1 gram intra-operatively.   HOSPITAL COURSE:  The patient's hospital course essentially  unremarkable.  The patient progressed well after surgery.  He received  Kefzol for infection prophylaxis for 24 hours.  He received Coumadin for  DVT prophylaxis.  The patient progressed well with physical therapy and  was discharged to home in stable condition on October 16, 2006 with  follow-up in the office in 2 weeks.  The patient was set up with home  health physical therapy, a prescription for Norco, Robaxin and Coumadin.      Nadara Mustard, MD  Electronically Signed     MVD/MEDQ  D:  12/28/2006  T:  12/28/2006  Job:  [id number]
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
## 2011-01-28 NOTE — Op Note (Signed)
NAME:  Wesley [name], Wesley [name]                         ACCOUNT NO.:  [id number]   MEDICAL RECORD NO.:  [id number]                   PATIENT TYPE:  AMB   LOCATION:  DSC                                  FACILITY:  [facility]   PHYSICIAN:  Loreta Ave, M.D.              DATE OF BIRTH:  April 20, 1946   DATE OF PROCEDURE:  11/08/2002  DATE OF DISCHARGE:                                 OPERATIVE REPORT   PREOPERATIVE DIAGNOSIS:  Medial meniscal tear, chondromalacia patella of the  right knee.   POSTOPERATIVE DIAGNOSIS:  Medial meniscal tear, chondromalacia patella of  the right knee with grade 3 chondromalacia of the patellofemoral joint as  well as grade 3 chondromalacia of the medial femoral condyle.   OPERATION PERFORMED:  Right knee examination under anesthesia, arthroscopy,  partial medial meniscectomy.  Extensive chondroplasty of the patellofemoral  joint.  Lesser extent chondroplasty medial femoral condyle.   SURGEON:  Loreta Ave, M.D.   ASSISTANT:  Arlys John D. Petrarca, P.A.-C.   ANESTHESIA:  Knee block with sedation.   SPECIMENS:  None.   CULTURES:  None.   COMPLICATIONS:  None.   DRESSING:  Soft compressive.   DESCRIPTION OF PROCEDURE:  The patient was brought to the operating room and  placed on the operating table in supine position.  After adequate anesthesia  had been obtained, the right knee was examined.  Positive McMurray's,  positive patellofemoral crepitus.  Otherwise full motion, stable knee.  Tourniquet and leg holder applied.  Leg prepped and draped in the usual  sterile fashion.  Three portals created, one superolateral, one each medial  and lateral parapatellar.  Inflow catheter introduced, knee distended,  arthroscope introduced, knee inspected.  Extensive fragmented fibrillated  grade 3 chondromalacia almost entire patellofemoral joint.  Reasonable  tracking.  Thorough chondroplasty throughout.  Although changes with loss of  a lot of the thickness of the  cartilage throughout.  There was some  remaining cartilage throughout and no exposed bone.  Hypertrophic synovitis  removed.  Medial meniscus had extensive degenerative tearing and  chondrocalcinosis, posterior half of the medial meniscus.  Posterior half  removed, tapered  into the remaining meniscus, salvaging the anterior half.  Some fraying of the anterior third debrided as well.  The medial femoral  condyle had grade three changes but not nearly as deep as patellofemoral  joint.  Chondroplasty throughout to a stable surface.  Cruciate ligaments  intact.  Lateral meniscus,  lateral compartment looked good.  Instruments and fluid removed.  Portals of  the knee injected with Marcaine.  Portals closed with 4-0 nylon.  Sterile  compressive dressing applied.  Anesthesia reversed.  Brought to recovery  room.  Tolerated surgery well.  No complications.  Loreta Ave, M.D.    DFM/MEDQ  D:  11/08/2002  T:  11/08/2002  Job:  [id number]
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
## 2011-02-03 ENCOUNTER — Other Ambulatory Visit (HOSPITAL_COMMUNITY): Payer: Self-pay

## 2011-02-24 ENCOUNTER — Other Ambulatory Visit: Payer: Self-pay | Admitting: Gastroenterology

## 2011-02-25 ENCOUNTER — Telehealth: Payer: Self-pay | Admitting: Pulmonary Disease

## 2011-02-25 NOTE — Telephone Encounter (Signed)
Spoke with pt's spouse. She states that she has not heard back on CPAP download and is requesting these results. She states that pt unable to tolerate current pressure at all, feels like he "wakes up in a hurricane"- too much pressure. She states that he can only wear mask for max of 2 hours. She is requesting response asap. VS out of the office until 03/09/11 so will forward to Dr Vassie Loll for recs. Please advise, thanks!

## 2011-02-25 NOTE — Telephone Encounter (Signed)
Noted  

## 2011-02-25 NOTE — Telephone Encounter (Signed)
He needed 17 cm pressure during the study We will ask DME to lower his pr to auto 12-17 & send download in 2 weeks to Dr Craige Cotta. Pl send in order

## 2011-02-25 NOTE — Telephone Encounter (Signed)
Dr. Craige Cotta, I spoke with pt's spouse and notified of recs per RA.  She states that this is what the pt has been doing for the past 2 wks is a auto cpap 12-17. She wants you to be the doc to review the results and give your recs. Please advise thanks

## 2011-03-01 ENCOUNTER — Telehealth: Payer: Self-pay | Admitting: Pulmonary Disease

## 2011-03-01 ENCOUNTER — Encounter: Payer: Medicare Other | Attending: Physical Medicine & Rehabilitation | Admitting: Physical Medicine & Rehabilitation

## 2011-03-01 DIAGNOSIS — S069X9A Unspecified intracranial injury with loss of consciousness of unspecified duration, initial encounter: Secondary | ICD-10-CM

## 2011-03-01 DIAGNOSIS — F329 Major depressive disorder, single episode, unspecified: Secondary | ICD-10-CM

## 2011-03-01 DIAGNOSIS — F4310 Post-traumatic stress disorder, unspecified: Secondary | ICD-10-CM

## 2011-03-01 DIAGNOSIS — X58XXXA Exposure to other specified factors, initial encounter: Secondary | ICD-10-CM | POA: Insufficient documentation

## 2011-03-01 DIAGNOSIS — F489 Nonpsychotic mental disorder, unspecified: Secondary | ICD-10-CM | POA: Insufficient documentation

## 2011-03-01 DIAGNOSIS — C140 Malignant neoplasm of pharynx, unspecified: Secondary | ICD-10-CM | POA: Insufficient documentation

## 2011-03-01 DIAGNOSIS — R609 Edema, unspecified: Secondary | ICD-10-CM | POA: Insufficient documentation

## 2011-03-01 DIAGNOSIS — F3289 Other specified depressive episodes: Secondary | ICD-10-CM

## 2011-03-01 DIAGNOSIS — S069XAA Unspecified intracranial injury with loss of consciousness status unknown, initial encounter: Secondary | ICD-10-CM

## 2011-03-01 DIAGNOSIS — F070 Personality change due to known physiological condition: Secondary | ICD-10-CM

## 2011-03-01 DIAGNOSIS — G4733 Obstructive sleep apnea (adult) (pediatric): Secondary | ICD-10-CM

## 2011-03-01 NOTE — Telephone Encounter (Signed)
Patient's wife calling asking for response to last message.

## 2011-03-01 NOTE — Telephone Encounter (Signed)
Spoke with pt's wife over the phone.  She states that BPAP download was sent by [facility] last week.  I have not received this yet.  He is able to fall asleep with BPAP, but then feels like there is too much pressure from machine.  He has become more irritable because of sleep disruption.  I will send order to have [facility] refax recent BPAP download.  Will also empirically decrease his BPAP to 12/12.  Will then f/u in office later this month to further assess.
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
## 2011-03-11 ENCOUNTER — Encounter: Payer: Self-pay | Admitting: Pulmonary Disease

## 2011-03-11 ENCOUNTER — Ambulatory Visit (INDEPENDENT_AMBULATORY_CARE_PROVIDER_SITE_OTHER): Payer: Medicare Other | Admitting: Pulmonary Disease

## 2011-03-11 VITALS — BP 112/70 | HR 62 | Temp 98.7°F | Ht 69.0 in | Wt 294.6 lb

## 2011-03-11 DIAGNOSIS — G4733 Obstructive sleep apnea (adult) (pediatric): Secondary | ICD-10-CM
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
## 2011-03-11 NOTE — Patient Instructions (Signed)
Will get download from BPAP machine and call with results Follow up in 6 months

## 2011-03-11 NOTE — Assessment & Plan Note (Signed)
He is doing better since his BPAP was set at 12/12.  Will get his download and call him with results.

## 2011-04-05 ENCOUNTER — Telehealth: Payer: Self-pay | Admitting: Pulmonary Disease

## 2011-04-05 NOTE — Telephone Encounter (Signed)
Called and spoke with pt's wife, Clydie Braun.  Clydie Braun is upset because they haven't heard anything back on pt's bipap download, which was faxed to [place] from [facility] over 2 weeks ago.  Informed Clydie Braun that Dr. Craige Cotta has been out of the office for several weeks but will return tomorrow.  Clydie Braun wanted to make sure we received download from [place], which I looked through Dr. Evlyn Courier look at folder and was unable to locate.  Therefore, called [facility] and requested download be faxed to triage fax #.   ** Pt requested I call her back asap once I receive the download.  Also, wife is very upset because she states this issue has been happening for over 2 years now. She states [facility] will fax the download and our office will say we never received it.  She states they will wait for several weeks before ever hearing results.  Wife also states she will mail the download card to [facility] and then it will take several weeks to get the download card back from [place] as [facility] will inform her they cannot mail the card back to pt until they get the ok from our office and then wife states she has to wait again for several weeks before getting the card back.  Wife would like to speak to our director on this matter.  Will forward message to [place].

## 2011-04-05 NOTE — Telephone Encounter (Signed)
ALSO, PATIENT'S WIFE IS UPSET BECAUSE WE HAVE NEVER CALLED PATIENT REGARDING DOWNLOADS FOR 2 YEARS.  SHE FEELS THAT SHE SHOULD NOT HAVE TO BE RESPONSIBLE TO TRACK FAX DOWN AND MAKE SURE DR SOOD HAS RECEIVED IT.

## 2011-04-11 ENCOUNTER — Telehealth: Payer: Self-pay | Admitting: Pulmonary Disease

## 2011-04-11 DIAGNOSIS — G4733 Obstructive sleep apnea (adult) (pediatric): Secondary | ICD-10-CM

## 2011-04-11 NOTE — Telephone Encounter (Signed)
Updated pt's wife about BPAP download from 03/02/11 to 03/21/11>>Used on 12 of 20 nights with average 2hrs 55 min.  With BPAP 12/12 average AHI 7.7 with minimal airleak.  Pt's wife reports that Mr. Kleinsasser can fall asleep easily with BPAP.  He wakes up after 2 to 3 hours with a nightmare and has trouble falling back to sleep.  He will eventually fall asleep, but may not put his BPAP back on.    I explained that his waking up with nightmares could be related to PTSD, but could also be related to worsening sleep apnea with REM sleep.  Advised her to encourage him to replace BPAP when he goes back to sleep, and to call if there are any further concerns.

## 2011-04-11 NOTE — Telephone Encounter (Signed)
Have spoken to wife and she is better now, they are going to stay with [facility] for now and hope things will be better from this point on, wife is aware any more problems they should contact us immed. Wife is fine with this, also have spoken to pam capello who is the director of [facility] so she is aware of the problems pt has had and is following up on them.

## 2011-04-12 ENCOUNTER — Ambulatory Visit: Payer: Medicare Other | Admitting: Physical Medicine & Rehabilitation

## 2011-04-19 ENCOUNTER — Other Ambulatory Visit: Payer: Self-pay | Admitting: Oncology

## 2011-04-19 ENCOUNTER — Encounter (HOSPITAL_BASED_OUTPATIENT_CLINIC_OR_DEPARTMENT_OTHER): Payer: Medicare Other | Admitting: Oncology

## 2011-04-19 DIAGNOSIS — C099 Malignant neoplasm of tonsil, unspecified: Secondary | ICD-10-CM

## 2011-04-19 DIAGNOSIS — C090 Malignant neoplasm of tonsillar fossa: Secondary | ICD-10-CM

## 2011-04-19 DIAGNOSIS — D649 Anemia, unspecified: Secondary | ICD-10-CM

## 2011-04-19 DIAGNOSIS — N289 Disorder of kidney and ureter, unspecified: Secondary | ICD-10-CM

## 2011-04-19 LAB — CBC WITH DIFFERENTIAL/PLATELET
Basophils Absolute: 0 10*3/uL (ref 0.0–0.1)
EOS%: 2.2 % (ref 0.0–7.0)
HCT: 30.9 % — ABNORMAL LOW (ref 38.4–49.9)
HGB: 10.4 g/dL — ABNORMAL LOW (ref 13.0–17.1)
MCH: 30 pg (ref 27.2–33.4)
NEUT%: 79.4 % — ABNORMAL HIGH (ref 39.0–75.0)
Platelets: 257 10*3/uL (ref 140–400)
lymph#: 0.9 10*3/uL (ref 0.9–3.3)

## 2011-04-19 LAB — TSH: TSH: 11.698 u[IU]/mL — ABNORMAL HIGH (ref 0.350–4.500)

## 2011-04-19 LAB — COMPREHENSIVE METABOLIC PANEL
ALT: 10 U/L (ref 0–53)
AST: 12 U/L (ref 0–37)
Alkaline Phosphatase: 127 U/L — ABNORMAL HIGH (ref 39–117)
CO2: 30 mEq/L (ref 19–32)
Creatinine, Ser: 2.8 mg/dL — ABNORMAL HIGH (ref 0.50–1.35)
Total Bilirubin: 0.2 mg/dL — ABNORMAL LOW (ref 0.3–1.2)

## 2011-06-06 ENCOUNTER — Telehealth: Payer: Self-pay | Admitting: Pulmonary Disease
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
## 2011-06-15 NOTE — Telephone Encounter (Signed)
Noted  

## 2011-06-21 ENCOUNTER — Other Ambulatory Visit: Payer: Self-pay | Admitting: Otolaryngology

## 2011-06-21 ENCOUNTER — Ambulatory Visit
Admission: RE | Admit: 2011-06-21 | Discharge: 2011-06-21 | Disposition: A | Discharge status: Home / Self Care | Payer: Medicare Other | Source: Ambulatory Visit | Attending: Otolaryngology | Admitting: Otolaryngology

## 2011-06-21 DIAGNOSIS — C099 Malignant neoplasm of tonsil, unspecified: Secondary | ICD-10-CM

## 2011-06-30 ENCOUNTER — Ambulatory Visit
Admission: RE | Admit: 2011-06-30 | Discharge: 2011-06-30 | Disposition: A | Discharge status: Home / Self Care | Payer: Medicare Other | Source: Ambulatory Visit | Attending: Radiation Oncology | Admitting: Radiation Oncology

## 2011-09-08 ENCOUNTER — Ambulatory Visit: Payer: Medicare Other | Admitting: Radiation Oncology

## 2011-09-09 ENCOUNTER — Encounter: Payer: Medicare Other | Admitting: Physical Medicine & Rehabilitation

## 2011-09-15 ENCOUNTER — Encounter: Payer: Self-pay | Admitting: Radiation Oncology

## 2011-09-15 ENCOUNTER — Ambulatory Visit
Admission: RE | Admit: 2011-09-15 | Discharge: 2011-09-15 | Disposition: A | Discharge status: Home / Self Care | Payer: Medicare Other | Source: Ambulatory Visit | Attending: Radiation Oncology | Admitting: Radiation Oncology

## 2011-09-15 DIAGNOSIS — R599 Enlarged lymph nodes, unspecified: Secondary | ICD-10-CM

## 2011-09-15 HISTORY — DX: Unspecified intracranial injury with loss of consciousness status unknown, initial encounter: S06.9XAA

## 2011-09-15 HISTORY — DX: Unspecified intracranial injury with loss of consciousness of unspecified duration, initial encounter: S06.9X9A

## 2011-09-15 NOTE — Progress Notes (Signed)
SWALLOWING WELL EATING REGULAR FOODS, NO TASTE AT ALL.  NO SORE THROAT

## 2011-09-19 NOTE — Progress Notes (Signed)
CC:   Wesley [name], M.D. [name], MD [name], M.D.  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  PREVIOUS RADIATION:  Chemoradiation to a total dose of 70 Gy, completed 07/22/10.  INTERVAL SINCE TREATMENT:  1 year and 2 months.  INTERVAL HISTORY:  Wesley [name] reports for followup today.  He has seen Dr. Jenne Pane in the interim.  He has a followup appointment with Dr. Gaylyn Rong in a couple of months.  He reports stable symptoms of decreased taste and dry mouth.  He had labs drawn, and his TSH was elevated, so they have increased his Synthroid dose.  He has good energy and appetite per his wife.  His creatinine is stable at 2.8, and he is still being followed every 6 months by Nephrology.  He has followup scheduled by Dr. Gaylyn Rong in a month or so.  PHYSICAL EXAMINATION:  General:  He is a pleasant male in no distress sitting comfortably on the exam room table.  His weight is up 9 pounds to 296.  HEENT:  Otherwise, he has no palpable or visible evidence of tumor recurrence in his oral cavity or oropharynx.  His mucosa appears moist when I examined him, certainly not as sensitive as it was when I examined him in October.  He continues to have fullness.  The fullness on his chin and neck appears to be improving.  He continues to have some palpable fullness on the left side of his neck that is stable.  It has been evaluated by CT and PET and is negative.  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 No evidence of disease.  RECOMMENDATION:  Wesley [name] continues to look great.  He seems to be fairly functional, and I am glad his kidney function has stabilized.  He has followup with Dr. Gaylyn Rong in February.  I asked him to make an appointment with Dr. Jenne Pane in May, with Dr. Gaylyn Rong again in August, and I will plan on seeing him back for his 2-year anniversary in November.  He knows to contact us with any questions or concerns.  I have encouraged his wife  to continue following up on the hypothyroidism issue, as it could be the cause of his weight gain.    ______________________________ Lurline Hare, M.D. SW/MEDQ  D:  09/16/2011  T:  09/19/2011  Job:  [id number]

## 2011-10-04 ENCOUNTER — Encounter: Payer: Self-pay | Admitting: *Deleted

## 2011-10-21 ENCOUNTER — Ambulatory Visit (HOSPITAL_BASED_OUTPATIENT_CLINIC_OR_DEPARTMENT_OTHER): Payer: Medicare Other | Admitting: Oncology

## 2011-10-21 ENCOUNTER — Ambulatory Visit: Payer: Medicare Other

## 2011-10-21 VITALS — BP 140/79 | HR 59 | Temp 97.0°F | Ht 69.0 in | Wt 286.7 lb

## 2011-10-21 DIAGNOSIS — C109 Malignant neoplasm of oropharynx, unspecified: Secondary | ICD-10-CM

## 2011-10-21 DIAGNOSIS — R5383 Other fatigue: Secondary | ICD-10-CM

## 2011-10-21 DIAGNOSIS — D631 Anemia in chronic kidney disease: Secondary | ICD-10-CM

## 2011-10-21 DIAGNOSIS — E039 Hypothyroidism, unspecified: Secondary | ICD-10-CM

## 2011-10-21 DIAGNOSIS — N189 Chronic kidney disease, unspecified: Secondary | ICD-10-CM

## 2011-10-21 LAB — COMPREHENSIVE METABOLIC PANEL
ALT: 12 U/L (ref 0–53)
AST: 14 U/L (ref 0–37)
Albumin: 3.6 g/dL (ref 3.5–5.2)
CO2: 28 mEq/L (ref 19–32)
Calcium: 9.1 mg/dL (ref 8.4–10.5)
Chloride: 101 mEq/L (ref 96–112)
Creatinine, Ser: 2.88 mg/dL — ABNORMAL HIGH (ref 0.50–1.35)
Potassium: 4 mEq/L (ref 3.5–5.3)

## 2011-10-21 LAB — CBC WITH DIFFERENTIAL/PLATELET
BASO%: 0.2 % (ref 0.0–2.0)
Basophils Absolute: 0 10*3/uL (ref 0.0–0.1)
EOS%: 2.1 % (ref 0.0–7.0)
HCT: 34.6 % — ABNORMAL LOW (ref 38.4–49.9)
HGB: 11.7 g/dL — ABNORMAL LOW (ref 13.0–17.1)
MONO#: 0.6 10*3/uL (ref 0.1–0.9)
NEUT%: 80.1 % — ABNORMAL HIGH (ref 39.0–75.0)
RDW: 14.8 % — ABNORMAL HIGH (ref 11.0–14.6)
WBC: 10.6 10*3/uL — ABNORMAL HIGH (ref 4.0–10.3)
lymph#: 1.2 10*3/uL (ref 0.9–3.3)
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
## 2011-10-24 ENCOUNTER — Other Ambulatory Visit: Payer: Self-pay | Admitting: Oncology

## 2011-10-24 DIAGNOSIS — E039 Hypothyroidism, unspecified: Secondary | ICD-10-CM

## 2011-10-24 DIAGNOSIS — C109 Malignant neoplasm of oropharynx, unspecified: Secondary | ICD-10-CM

## 2011-10-24 DIAGNOSIS — R5383 Other fatigue: Secondary | ICD-10-CM

## 2011-10-24 LAB — T4, FREE: Free T4: 1.02 ng/dL (ref 0.80–1.80)

## 2012-04-18 ENCOUNTER — Encounter: Payer: Self-pay | Admitting: Oncology

## 2012-04-18 ENCOUNTER — Telehealth: Payer: Self-pay | Admitting: *Deleted

## 2012-04-18 ENCOUNTER — Ambulatory Visit (HOSPITAL_BASED_OUTPATIENT_CLINIC_OR_DEPARTMENT_OTHER): Payer: Medicare Other | Admitting: Oncology

## 2012-04-18 ENCOUNTER — Other Ambulatory Visit (HOSPITAL_BASED_OUTPATIENT_CLINIC_OR_DEPARTMENT_OTHER): Payer: Medicare Other | Admitting: Lab

## 2012-04-18 VITALS — BP 121/68 | HR 60 | Temp 97.6°F | Resp 14 | Ht 69.0 in | Wt 290.7 lb

## 2012-04-18 DIAGNOSIS — C109 Malignant neoplasm of oropharynx, unspecified: Secondary | ICD-10-CM

## 2012-04-18 DIAGNOSIS — C099 Malignant neoplasm of tonsil, unspecified: Secondary | ICD-10-CM

## 2012-04-18 DIAGNOSIS — R5383 Other fatigue: Secondary | ICD-10-CM

## 2012-04-18 DIAGNOSIS — E039 Hypothyroidism, unspecified: Secondary | ICD-10-CM

## 2012-04-18 DIAGNOSIS — D649 Anemia, unspecified: Secondary | ICD-10-CM

## 2012-04-18 DIAGNOSIS — R5381 Other malaise: Secondary | ICD-10-CM

## 2012-04-18 DIAGNOSIS — Z85819 Personal history of malignant neoplasm of unspecified site of lip, oral cavity, and pharynx: Secondary | ICD-10-CM | POA: Insufficient documentation

## 2012-04-18 DIAGNOSIS — I10 Essential (primary) hypertension: Secondary | ICD-10-CM

## 2012-04-18 LAB — CBC WITH DIFFERENTIAL/PLATELET
Basophils Absolute: 0.1 10*3/uL (ref 0.0–0.1)
HCT: 36.3 % — ABNORMAL LOW (ref 38.4–49.9)
HGB: 11.9 g/dL — ABNORMAL LOW (ref 13.0–17.1)
MCH: 28.5 pg (ref 27.2–33.4)
MONO#: 0.6 10*3/uL (ref 0.1–0.9)
NEUT%: 86.6 % — ABNORMAL HIGH (ref 39.0–75.0)
Platelets: 230 10*3/uL (ref 140–400)
lymph#: 0.8 10*3/uL — ABNORMAL LOW (ref 0.9–3.3)

## 2012-04-18 LAB — COMPREHENSIVE METABOLIC PANEL
BUN: 47 mg/dL — ABNORMAL HIGH (ref 6–23)
CO2: 29 mEq/L (ref 19–32)
Calcium: 9.1 mg/dL (ref 8.4–10.5)
Chloride: 100 mEq/L (ref 96–112)
Creatinine, Ser: 2.88 mg/dL — ABNORMAL HIGH (ref 0.50–1.35)

## 2012-04-18 LAB — TSH: TSH: 7.168 u[IU]/mL — ABNORMAL HIGH (ref 0.350–4.500)
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
## 2012-04-18 NOTE — Telephone Encounter (Signed)
Gave patient appointment for 04-18-2013 starting at 12:45pm with labs

## 2012-07-19 ENCOUNTER — Ambulatory Visit: Payer: Medicare Other | Admitting: Radiation Oncology

## 2012-07-26 ENCOUNTER — Encounter: Payer: Self-pay | Admitting: Radiation Oncology

## 2012-07-26 ENCOUNTER — Ambulatory Visit (HOSPITAL_COMMUNITY)
Admission: RE | Admit: 2012-07-26 | Discharge: 2012-07-26 | Disposition: A | Discharge status: Home / Self Care | Payer: Medicare Other | Source: Ambulatory Visit | Attending: Radiation Oncology | Admitting: Radiation Oncology

## 2012-07-26 ENCOUNTER — Ambulatory Visit
Admission: RE | Admit: 2012-07-26 | Discharge: 2012-07-26 | Disposition: A | Discharge status: Home / Self Care | Payer: Medicare Other | Source: Ambulatory Visit | Attending: Radiation Oncology | Admitting: Radiation Oncology

## 2012-07-26 VITALS — BP 136/75 | HR 54 | Temp 98.1°F | Wt 282.1 lb

## 2012-07-26 DIAGNOSIS — C109 Malignant neoplasm of oropharynx, unspecified: Secondary | ICD-10-CM | POA: Insufficient documentation

## 2012-07-26 DIAGNOSIS — I10 Essential (primary) hypertension: Secondary | ICD-10-CM | POA: Insufficient documentation

## 2012-07-26 DIAGNOSIS — I517 Cardiomegaly: Secondary | ICD-10-CM | POA: Insufficient documentation

## 2012-07-26 DIAGNOSIS — J984 Other disorders of lung: Secondary | ICD-10-CM | POA: Insufficient documentation

## 2012-07-26 HISTORY — DX: Personal history of irradiation: Z92.3

## 2012-07-26 NOTE — Progress Notes (Signed)
FU today.  Denies any dysphagia or odonyphagia post treatment but has a history of diff. Swallowing due to issues after past intubations. States when he eats he cannot taste sweet and cannot differentiate between taste in meats. " I just eat"

## 2012-07-26 NOTE — Progress Notes (Signed)
Department of Radiation Oncology  Phone:  [phone] Fax:        [phone]   Name: Wesley [name]   DOB: May 31, 1946  MRN: [id number]    Date: 07/26/2012  Follow Up Visit Note  Diagnosis: T3 N2 B. squamous cell carcinoma of the left tonsil  Interval since last radiation: 2 years  Interval History: Wesley [name] presents today for routine followup.  He is feeling well and doing well. He has no new mouth pain. The lymph edema underneath his chin has resolved. He reports no new swelling or oral lesions. He continues to see Dr. Jenne Pane every 6 months and has an appointment with Dr. Velna Ochs in August. His primary care physician is managing his thyroid and he is followed by nephrology for his renal insufficiency. His last creatinine in August was 2.88. He is taking most of his nutrition through a liquid dietary supplement as he has no taste. He and his wife just took her entire family on a [place] cruise which enjoyed very much. He had a chest x-ray in October 2012 which was negative for metastatic disease.  Allergies: No Known Allergies  Medications:  Current Outpatient Prescriptions  Medication Sig Dispense Refill  . amLODipine (NORVASC) 10 MG tablet Take 10 mg by mouth daily.        . Cholecalciferol (VITAMIN D3) 1000 UNITS CAPS Take by mouth. Once a day       . citalopram (CELEXA) 40 MG tablet Take 20 mg by mouth daily.       [name] galantamine (RAZADYNE ER) 16 MG 24 hr capsule Take 16 mg by mouth daily.        [name] levothyroxine (SYNTHROID, LEVOTHROID) 88 MCG tablet Take 88 mcg by mouth daily.      . metoprolol (LOPRESSOR) 50 MG tablet 1 twice a day      . omeprazole (PRILOSEC) 20 MG capsule 1 tablet twice a day      . pravastatin (PRAVACHOL) 20 MG tablet Take 20 mg by mouth daily.      . QUEtiapine (SEROQUEL) 25 MG tablet Take 25 mg by mouth 2 (two) times daily. 1 tab in morning and 3 at bedtime      . Tamsulosin HCl (FLOMAX) 0.4 MG CAPS Once a day        Physical Exam:   weight is 282 lb  1.6 oz (127.96 kg). His temperature is 98.1 F (36.7 C). His blood pressure is 136/75 and his pulse is 54.  Wt Readings from Last 3 Encounters:  07/26/12 282 lb 1.6 oz (127.96 kg)  04/18/12 290 lb 11.2 oz (131.861 kg)  10/21/11 286 lb 11.2 oz (130.046 kg)   he is a pleasant male who appears his stated age in no distress sitting comfortably examining table. He has no palpable cervical or supraclavicular adenopathy. Examination of the oral cavity and oropharynx/shows no lesions. Palpation of the right tonsil and base of tongue shows no lesions.  IMPRESSION: Wesley [name] is a 66 y.o. male status post chemoradiation for locally advanced head and neck cancer with no evidence of disease now 2 years out from treatment  PLAN:  Current recommendations for followup after 2 years including examination every 4-8 months. He is seeing Dr. Irving Burton and Dr. Jenne Pane on a yearly basis. I've therefore released him from followup with me. We discussed long-term effects of radiation including the use of some protection on the radiated area, regular dental care. He knows he can contact me with any questions or concerning symptoms  in the interim. I've ordered a chest x-ray today.    Lurline Hare, MD

## 2012-08-02 ENCOUNTER — Telehealth: Payer: Self-pay | Admitting: *Deleted

## 2012-08-02 NOTE — Telephone Encounter (Signed)
Wife called asking for results of CXR done 07/26/12. Spoke w/Dr Michell Heinrich; per dr, informed Ms Bostic CXR "looks good". She verbalized understanding.

## 2013-04-18 ENCOUNTER — Encounter: Payer: Self-pay | Admitting: Oncology

## 2013-04-18 ENCOUNTER — Other Ambulatory Visit (HOSPITAL_BASED_OUTPATIENT_CLINIC_OR_DEPARTMENT_OTHER): Payer: Medicare Other | Admitting: Lab

## 2013-04-18 ENCOUNTER — Ambulatory Visit (HOSPITAL_BASED_OUTPATIENT_CLINIC_OR_DEPARTMENT_OTHER): Payer: Medicare Other | Admitting: Oncology

## 2013-04-18 ENCOUNTER — Telehealth: Payer: Self-pay | Admitting: Hematology and Oncology

## 2013-04-18 VITALS — BP 145/70 | HR 66 | Temp 97.7°F | Resp 20 | Ht 69.0 in | Wt 295.1 lb

## 2013-04-18 DIAGNOSIS — C109 Malignant neoplasm of oropharynx, unspecified: Secondary | ICD-10-CM

## 2013-04-18 DIAGNOSIS — E039 Hypothyroidism, unspecified: Secondary | ICD-10-CM

## 2013-04-18 LAB — COMPREHENSIVE METABOLIC PANEL (CC13)
AST: 12 U/L (ref 5–34)
Albumin: 3.5 g/dL (ref 3.5–5.0)
BUN: 45.1 mg/dL — ABNORMAL HIGH (ref 7.0–26.0)
Calcium: 9.5 mg/dL (ref 8.4–10.4)
Chloride: 101 mEq/L (ref 98–109)
Creatinine: 2.7 mg/dL — ABNORMAL HIGH (ref 0.7–1.3)
Glucose: 99 mg/dl (ref 70–140)
Potassium: 4.9 mEq/L (ref 3.5–5.1)

## 2013-04-18 LAB — CBC WITH DIFFERENTIAL/PLATELET
BASO%: 1 % (ref 0.0–2.0)
Basophils Absolute: 0.1 10*3/uL (ref 0.0–0.1)
EOS%: 3.1 % (ref 0.0–7.0)
HCT: 35.1 % — ABNORMAL LOW (ref 38.4–49.9)
HGB: 11.7 g/dL — ABNORMAL LOW (ref 13.0–17.1)
LYMPH%: 17.7 % (ref 14.0–49.0)
MCH: 29 pg (ref 27.2–33.4)
MCHC: 33.2 g/dL (ref 32.0–36.0)
NEUT%: 69.8 % (ref 39.0–75.0)
Platelets: 202 10*3/uL (ref 140–400)
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
## 2013-04-18 NOTE — Telephone Encounter (Signed)
, °

## 2013-04-19 ENCOUNTER — Telehealth: Payer: Self-pay | Admitting: *Deleted

## 2013-04-19 ENCOUNTER — Other Ambulatory Visit: Payer: Self-pay | Admitting: Oncology

## 2013-04-19 DIAGNOSIS — E039 Hypothyroidism, unspecified: Secondary | ICD-10-CM

## 2013-04-19 NOTE — Telephone Encounter (Signed)
Spoke with pt's wife, she verbalized understanding.  SLJ

## 2013-04-19 NOTE — Telephone Encounter (Signed)
Spoke with pt's wife.  She stated that they are going to the [place] next week and they will be checking his TSH level as well.  They can make the dose adjustment to the synthroid and it will not cost them anything if they prescribe the synthroid rx.  They would like the TSH to be checked in 8 weeks at [facility].  They would like to know why the TSH level continues to increase.  Will forward msg back to [employer].  SLJ

## 2013-04-19 NOTE — Telephone Encounter (Signed)
The TSH has remained relatively stable since last year. There will be some level a variation in the lab result from time to time. Many factors can affect the TSH including diet so it is difficult to pinpoint exactly why a TSH may change. It is fine if he want to get Rx from [place]. I will place an order for ~ 8 weeks to have TSH rechecked here.

## 2013-04-19 NOTE — Telephone Encounter (Signed)
Message copied by Caren Griffins on Fri Apr 19, 2013 12:47 PM ------      Message from: Kallie Locks      Created: Fri Apr 19, 2013 10:59 AM                   ----- Message -----         From: Myrtis Ser, NP         Sent: 04/18/2013   4:03 PM           To: Marcell Barlow, RN            Please call pt. TSH is elevated. I recommend that we increase his Synthroid to 100 mcg daily. Where should I send this Rx? With dose change we need to recheck TSH in 8 weeks. Does he want to do this here or go to the [place]? Let me know and I will put in POF.            Thanks ------

## 2013-04-22 ENCOUNTER — Telehealth: Payer: Self-pay | Admitting: Hematology and Oncology

## 2013-04-22 NOTE — Telephone Encounter (Signed)
, °

## 2013-04-25 ENCOUNTER — Telehealth: Payer: Self-pay | Admitting: Pulmonary Disease

## 2013-04-25 NOTE — Telephone Encounter (Signed)
Spoke with patients spouse She states patient is interested in starting back on CPAP machine (having diff sleeping,night mares, etc) Pt has been scheduled for 06/03/13 @ 3pm per Lillia Abed w Dr Craige Cotta Nothing further needed at this time

## 2013-04-30 ENCOUNTER — Ambulatory Visit (INDEPENDENT_AMBULATORY_CARE_PROVIDER_SITE_OTHER): Payer: Medicare Other | Admitting: Cardiovascular Disease

## 2013-04-30 ENCOUNTER — Encounter: Payer: Self-pay | Admitting: Cardiovascular Disease

## 2013-04-30 VITALS — BP 130/82 | HR 58 | Ht 70.0 in | Wt 298.0 lb

## 2013-04-30 DIAGNOSIS — I359 Nonrheumatic aortic valve disorder, unspecified: Secondary | ICD-10-CM

## 2013-04-30 DIAGNOSIS — I10 Essential (primary) hypertension: Secondary | ICD-10-CM

## 2013-04-30 NOTE — Patient Instructions (Addendum)
Your physician has requested that you have an echocardiogram. Echocardiography is a painless test that uses sound waves to create images of your heart. It provides your doctor with information about the size and shape of your heart and how well your heart's chambers and valves are working. This procedure takes approximately one hour. There are no restrictions for this procedure.  Your physician wants you to follow-up in: 1 YEAR with Dr Cooper. You will receive a reminder letter in the mail two months in advance. If you don't receive a letter, please call our office to schedule the follow-up appointment.  Your physician recommends that you continue on your current medications as directed. Please refer to the Current Medication list given to you today.  
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
## 2013-05-01 ENCOUNTER — Institutional Professional Consult (permissible substitution): Payer: Medicare Other | Admitting: Cardiovascular Disease

## 2013-05-07 ENCOUNTER — Ambulatory Visit: Payer: Medicare Other | Admitting: Cardiovascular Disease

## 2013-05-10 ENCOUNTER — Other Ambulatory Visit (HOSPITAL_COMMUNITY): Payer: Medicare (Managed Care)

## 2013-05-15 ENCOUNTER — Ambulatory Visit (HOSPITAL_COMMUNITY): Payer: Medicare Other | Attending: Cardiovascular Disease | Admitting: Radiology

## 2013-05-15 ENCOUNTER — Other Ambulatory Visit (HOSPITAL_COMMUNITY): Payer: Medicare Other | Admitting: *Deleted

## 2013-05-15 DIAGNOSIS — I079 Rheumatic tricuspid valve disease, unspecified: Secondary | ICD-10-CM | POA: Insufficient documentation

## 2013-05-15 DIAGNOSIS — R0609 Other forms of dyspnea: Secondary | ICD-10-CM | POA: Insufficient documentation

## 2013-05-15 DIAGNOSIS — I10 Essential (primary) hypertension: Secondary | ICD-10-CM

## 2013-05-15 DIAGNOSIS — I359 Nonrheumatic aortic valve disorder, unspecified: Secondary | ICD-10-CM | POA: Insufficient documentation

## 2013-05-15 DIAGNOSIS — R609 Edema, unspecified: Secondary | ICD-10-CM | POA: Insufficient documentation

## 2013-05-15 DIAGNOSIS — Z87891 Personal history of nicotine dependence: Secondary | ICD-10-CM | POA: Insufficient documentation

## 2013-05-15 DIAGNOSIS — E662 Morbid (severe) obesity with alveolar hypoventilation: Secondary | ICD-10-CM | POA: Insufficient documentation

## 2013-05-15 DIAGNOSIS — R011 Cardiac murmur, unspecified: Secondary | ICD-10-CM | POA: Insufficient documentation

## 2013-05-15 DIAGNOSIS — E785 Hyperlipidemia, unspecified: Secondary | ICD-10-CM | POA: Insufficient documentation

## 2013-05-15 DIAGNOSIS — I379 Nonrheumatic pulmonary valve disorder, unspecified: Secondary | ICD-10-CM | POA: Insufficient documentation

## 2013-05-15 DIAGNOSIS — R0989 Other specified symptoms and signs involving the circulatory and respiratory systems: Secondary | ICD-10-CM | POA: Insufficient documentation

## 2013-05-15 MED ORDER — PERFLUTREN PROTEIN A MICROSPH IV SUSP
3.0000 mL | Freq: Once | INTRAVENOUS | Status: AC
  Administered 2013-05-15: 3 mL via INTRAVENOUS

## 2013-05-15 NOTE — Progress Notes (Signed)
Echocardiogram performed.  

## 2013-05-21 ENCOUNTER — Telehealth: Payer: Self-pay | Admitting: Hematology and Oncology

## 2013-05-21 NOTE — Telephone Encounter (Signed)
Pt wife called to cx 10/10 appt. Wife states that per [facility] if primary was drawing lb pt could cx this appt. Per wife primary drawing lb. Message to desk nurse.

## 2013-05-30 ENCOUNTER — Encounter: Payer: Self-pay | Admitting: Cardiovascular Disease

## 2013-05-30 NOTE — Telephone Encounter (Signed)
This encounter was created in error - please disregard.

## 2013-05-30 NOTE — Telephone Encounter (Signed)
New  Problem  Wesley [name],Wesley [name] is calling for the result of her husbands  echo from two weeks per wife.

## 2013-05-30 NOTE — Telephone Encounter (Signed)
Please review pt's echo so that I can contact pt with results.

## 2013-06-03 ENCOUNTER — Encounter: Payer: Self-pay | Admitting: Pulmonary Disease

## 2013-06-03 ENCOUNTER — Ambulatory Visit (HOSPITAL_BASED_OUTPATIENT_CLINIC_OR_DEPARTMENT_OTHER): Payer: Medicare Other | Attending: Pulmonary Disease | Admitting: Radiology

## 2013-06-03 ENCOUNTER — Ambulatory Visit (INDEPENDENT_AMBULATORY_CARE_PROVIDER_SITE_OTHER): Payer: Medicare Other | Admitting: Pulmonary Disease

## 2013-06-03 VITALS — BP 144/80 | HR 59 | Temp 97.8°F | Ht 70.0 in | Wt 307.0 lb

## 2013-06-03 VITALS — Ht 70.0 in | Wt 295.0 lb

## 2013-06-03 DIAGNOSIS — G4733 Obstructive sleep apnea (adult) (pediatric): Secondary | ICD-10-CM

## 2013-06-03 DIAGNOSIS — I10 Essential (primary) hypertension: Secondary | ICD-10-CM | POA: Insufficient documentation

## 2013-06-03 DIAGNOSIS — C109 Malignant neoplasm of oropharynx, unspecified: Secondary | ICD-10-CM | POA: Insufficient documentation
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
## 2013-06-03 NOTE — Assessment & Plan Note (Signed)
He has prior history of OSA/OHS.  He has hx of HTN and valvular heart disease.  He was off therapy for sleep apnea while being treated for oral cancer.  He continues to snoring, sleep disruption, and daytime sleepiness.  I am concerned he could still have sleep disordered breathing.  Will arrange for in lab sleep study to further assess.

## 2013-06-03 NOTE — Patient Instructions (Signed)
Will arrange for sleep study Will call to arrange for follow up after sleep study reviewed 

## 2013-06-04 DIAGNOSIS — G4733 Obstructive sleep apnea (adult) (pediatric): Secondary | ICD-10-CM

## 2013-06-04 NOTE — Procedures (Signed)
NAME:  Wesley [name], Wesley [name] NO.:  [id number]  MEDICAL RECORD NO.:  [id number]          PATIENT TYPE:  OUT  LOCATION:  SLEEP CENTER                 FACILITY:  [facility]  PHYSICIAN:  Coralyn Helling, MD        DATE OF BIRTH:  03-23-1946  DATE OF STUDY:  06/03/2013                           NOCTURNAL POLYSOMNOGRAM  REFERRING PHYSICIAN:  Coralyn Helling, MD  INDICATION FOR STUDY:  Wesley [name] is a 67 year old male, who has a history of hypertension, congestive heart failure, chronic pain and oral cancer.  He also had a previous diagnosis of obstructive sleep apnea and sleep-related hypoventilation.  He was not able to use therapy while being treated for his oral cancer.  He continued to have snoring, sleep disruption, and daytime sleepiness.  He is referred to the sleep lab for further evaluation of hypersomnia with sleep-disordered breathing.  Height is 5 feet 10 inches, weight is 295 pounds, BMI is 42, neck size 18 inches.  EPWORTH SLEEPINESS SCORE:  17.  MEDICATIONS:  Medications are reviewed in the chart.  SLEEP ARCHITECTURE:  Total recording time was 499 minutes.  Total sleep time was 308 minutes.  Sleep efficiency was 61%.  Sleep latency was 155 minutes.  REM latency is 189 minutes.  The study was notable for lack of slow-wave sleep and he slept exclusively in the nonsupine position.  RESPIRATORY DATA:  The average respiratory rate was 18.  Moderate snoring was noted by the technician.  The overall apnea/hypopnea index was 5.  There were 2 central apneic events with a 2 mixed respiratory events.  The remainder of the events were obstructive in nature.  The REM apnea-hypopnea index was 16.2.  OXYGEN DATA:  The baseline oxygenation was 90%.  The oxygen saturation nadir was 82%.  The patient had persistent oxygen desaturation both with and without other respiratory events.  He had 1 L of oxygen applied during the study with improvement in oxygen saturation.  CARDIAC DATA:   The average heart rate was 59 and the rhythm strip showed normal sinus rhythm.  MOVEMENT-PARASOMNIA:  The patient had 1 restroom trip.  The periodic limb movement index was 140.4.  IMPRESSIONS-RECOMMENDATIONS:  This study shows evidence for mild obstructive sleep apnea with overall apnea/hypopnea index of 5 and oxygen saturation nadir of 82%.  He did have a significant REM effect to sleep-disordered breathing.  Of note is that he did not sleep in the supine position and therefore the severity of his sleep apnea could be underestimated.  He had persistent oxygen desaturations while asleep.  These were evident both with and without additional respiratory events.  This is suggestive of sleep-related hypoventilation.  He had improvement in his oxygenation with application of 1 L of oxygen.  He had an increase in his periodic limb movement index and clinical correlation will be necessary to determine the significance of this.  In addition to diet, exercise, and weight reduction, I would recommend the patient return to the sleep lab for in-lab titration study.  At that time, it can be determined whether he would benefit from CPAP versus BiPAP versus an Adaptive Servo ventilator therapy.  It could also be determined  whether he would need the addition of supplemental oxygen.     Coralyn Helling, MD Diplomat, American Board of Sleep Medicine    VS/MEDQ  D:  06/04/2013 10:10:10  T:  06/04/2013 12:19:54  Job:  [id number]

## 2013-06-05 ENCOUNTER — Telehealth: Payer: Self-pay | Admitting: Pulmonary Disease

## 2013-06-05 ENCOUNTER — Other Ambulatory Visit: Payer: Self-pay | Admitting: Pulmonary Disease

## 2013-06-05 DIAGNOSIS — R0902 Hypoxemia: Secondary | ICD-10-CM

## 2013-06-05 NOTE — Telephone Encounter (Signed)
PSG 06/03/13 >> AHI 5, SpO2 low 82%, REM 16.  101 min with SpO2 < 88%.  Needed 1 liter oxygen.  Spoke with pt's wife.  Explained he has mild sleep apnea, but he also has hypoventilation with persistent oxygen desaturation.  Options are to: 1) proceed with CPAP/BiPAP in lab titration, 2) arrange for room air ONO to document oxygen desaturation while asleep, and then start 2 liters oxygen while asleep with repeat ONO on 2 liters oxygen.  She will discuss with her husband, and then call back to office to inform of which option they will go with.  Will route to my nurse to f/u with patient.  He will need ROV in 2 months regardless of option he chooses.

## 2013-06-05 NOTE — Telephone Encounter (Signed)
Pt 's wife calling back and said they will proceed with just the oxygen.Raylene Everts

## 2013-06-05 NOTE — Telephone Encounter (Signed)
Spoke with the pt's spouse and verified that the pt does want to proceed with ONO  Order was sent to [facility] Appt was scheduled for 2 month ov 08/05/13

## 2013-06-05 NOTE — Telephone Encounter (Signed)
Error.Wesley [name] ° °

## 2013-06-18 ENCOUNTER — Telehealth: Payer: Self-pay | Admitting: Pulmonary Disease

## 2013-06-18 DIAGNOSIS — G4736 Sleep related hypoventilation in conditions classified elsewhere: Secondary | ICD-10-CM

## 2013-06-18 NOTE — Telephone Encounter (Signed)
I was advised by Melissa pt had ONO done 06/07/13 and did qualify for O2. I called and made Clydie Braun aware that VS has not been in the office to review pt ONO results. She stated that was fine she was just checking since they have not heard from [place]. Will forward to Dr. Craige Cotta to make him aware

## 2013-06-18 NOTE — Telephone Encounter (Signed)
I have sent Melissa a staff message to check on this. Will await response back

## 2013-06-21 ENCOUNTER — Other Ambulatory Visit: Payer: Medicare Other | Admitting: Lab

## 2013-06-21 NOTE — Telephone Encounter (Signed)
Patient wife calling back again about ono results.  [phone]

## 2013-06-21 NOTE — Telephone Encounter (Signed)
ONO with RA 06/05/13 >> Test time 8 hrs 31 min.  Mean SpO2 84%, low SpO2 59%.  Spent 8 hrs 1 min with SpO2 < 88%.  Results d/w pt's wife.  Will arrange for 2 liters oxygen at night, and repeat ONO on this set up.

## 2013-07-11 ENCOUNTER — Telehealth: Payer: Self-pay | Admitting: Pulmonary Disease

## 2013-07-11 NOTE — Telephone Encounter (Signed)
I spoke with spouse and aware of results. Will forward to Dr. Craige Cotta

## 2013-07-11 NOTE — Telephone Encounter (Addendum)
Spoke with Wesley [name]/[facility]-- She is going to fax results to our office.  ---------------------------  Results received-- pt wife aware that we are going to have another sleep physician (Dr Maple Hudson) review these results and get back with them ASAP.   Dr Maple Hudson please advise on results seeing that VS out off office. Thanks.

## 2013-07-11 NOTE — Telephone Encounter (Signed)
Per CY-he has O2 sleep now so this is good enough until VS can review; however per CY we can move patient up to 2.5 L/M. I have given the results to Lillia Abed to hold for VS and we will need to forward message to her as well once patient and wife are aware. Thanks.

## 2013-07-16 NOTE — Telephone Encounter (Signed)
lmtcb x1 

## 2013-07-16 NOTE — Telephone Encounter (Signed)
Pt's wife is aware that they need to keep OV in 07/2013. She made it very clear to me that Mindy DID NOT give her the results of the pt's ONO.

## 2013-07-16 NOTE — Telephone Encounter (Signed)
ONO with 2 liters 06/27/13 >> Test time 7 hrs 25 min.  Basal SpO2 91.9%, low SpO2 76%.  Spent 11 min with SpO2 < 88%.  Will have my nurse schedule ROV to review status of sleep therapy.

## 2013-07-16 NOTE — Telephone Encounter (Signed)
lmtcb x2 

## 2013-07-16 NOTE — Telephone Encounter (Signed)
Pt's spouse returned call & can be reached at [phone].  Wesley [name]

## 2013-07-16 NOTE — Telephone Encounter (Signed)
Pt's wife returning call can be reached at [phone].Wesley [name]

## 2013-08-01 ENCOUNTER — Telehealth: Payer: Self-pay | Admitting: Cardiovascular Disease

## 2013-08-01 NOTE — Telephone Encounter (Signed)
New Problem  Wife called--- She states the pt is a veteran that receives disability// there is a list of heart related diseases and problems// She requests a call back to discuss the list of heart related diseases and if her husband has any of them// please call.

## 2013-08-01 NOTE — Telephone Encounter (Signed)
I called the pt's wife and answered her questions.

## 2013-08-05 ENCOUNTER — Encounter: Payer: Self-pay | Admitting: Pulmonary Disease

## 2013-08-05 ENCOUNTER — Ambulatory Visit (INDEPENDENT_AMBULATORY_CARE_PROVIDER_SITE_OTHER): Payer: Medicare Other | Admitting: Pulmonary Disease

## 2013-08-05 VITALS — BP 116/76 | HR 70 | Ht 69.0 in | Wt 303.0 lb

## 2013-08-05 DIAGNOSIS — E678 Other specified hyperalimentation: Secondary | ICD-10-CM

## 2013-08-05 NOTE — Patient Instructions (Signed)
Follow up in 6 months 

## 2013-08-05 NOTE — Assessment & Plan Note (Signed)
Improved with nocturnal oxygen.  He is to continue 2.5 liters oxygen at night.
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
## 2013-11-05 ENCOUNTER — Other Ambulatory Visit: Payer: Self-pay | Admitting: Orthopedic Surgery

## 2013-11-05 DIAGNOSIS — M419 Scoliosis, unspecified: Secondary | ICD-10-CM

## 2013-11-09 ENCOUNTER — Ambulatory Visit
Admission: RE | Admit: 2013-11-09 | Discharge: 2013-11-09 | Disposition: A | Discharge status: Home / Self Care | Payer: Medicare Other | Source: Ambulatory Visit | Attending: Orthopedic Surgery | Admitting: Orthopedic Surgery

## 2013-11-09 DIAGNOSIS — M419 Scoliosis, unspecified: Secondary | ICD-10-CM

## 2013-11-10 ENCOUNTER — Other Ambulatory Visit: Payer: Medicare Other

## 2013-11-24 ENCOUNTER — Encounter (HOSPITAL_COMMUNITY): Payer: Self-pay | Admitting: Emergency Medicine

## 2013-11-24 ENCOUNTER — Emergency Department (HOSPITAL_COMMUNITY): Payer: Medicare Other

## 2013-11-24 ENCOUNTER — Inpatient Hospital Stay (HOSPITAL_COMMUNITY)
Admission: EM | Admit: 2013-11-24 | Discharge: 2013-11-25 | DRG: 189 | Disposition: A | Discharge status: Home / Self Care | Payer: Medicare Other | Attending: Internal Medicine | Admitting: Internal Medicine

## 2013-11-24 DIAGNOSIS — I129 Hypertensive chronic kidney disease with stage 1 through stage 4 chronic kidney disease, or unspecified chronic kidney disease: Secondary | ICD-10-CM | POA: Diagnosis present

## 2013-11-24 DIAGNOSIS — G609 Hereditary and idiopathic neuropathy, unspecified: Secondary | ICD-10-CM | POA: Diagnosis present

## 2013-11-24 DIAGNOSIS — E662 Morbid (severe) obesity with alveolar hypoventilation: Secondary | ICD-10-CM | POA: Diagnosis present

## 2013-11-24 DIAGNOSIS — R062 Wheezing: Secondary | ICD-10-CM

## 2013-11-24 DIAGNOSIS — M545 Low back pain, unspecified: Secondary | ICD-10-CM | POA: Diagnosis present

## 2013-11-24 DIAGNOSIS — E039 Hypothyroidism, unspecified: Secondary | ICD-10-CM

## 2013-11-24 DIAGNOSIS — J9600 Acute respiratory failure, unspecified whether with hypoxia or hypercapnia: Principal | ICD-10-CM

## 2013-11-24 DIAGNOSIS — Z85819 Personal history of malignant neoplasm of unspecified site of lip, oral cavity, and pharynx: Secondary | ICD-10-CM

## 2013-11-24 DIAGNOSIS — E785 Hyperlipidemia, unspecified: Secondary | ICD-10-CM

## 2013-11-24 DIAGNOSIS — C109 Malignant neoplasm of oropharynx, unspecified: Secondary | ICD-10-CM

## 2013-11-24 DIAGNOSIS — J189 Pneumonia, unspecified organism: Secondary | ICD-10-CM

## 2013-11-24 DIAGNOSIS — F0390 Unspecified dementia without behavioral disturbance: Secondary | ICD-10-CM | POA: Diagnosis present

## 2013-11-24 DIAGNOSIS — N189 Chronic kidney disease, unspecified: Secondary | ICD-10-CM

## 2013-11-24 DIAGNOSIS — G4733 Obstructive sleep apnea (adult) (pediatric): Secondary | ICD-10-CM

## 2013-11-24 DIAGNOSIS — E678 Other specified hyperalimentation: Secondary | ICD-10-CM

## 2013-11-24 DIAGNOSIS — Z87820 Personal history of traumatic brain injury: Secondary | ICD-10-CM

## 2013-11-24 DIAGNOSIS — N184 Chronic kidney disease, stage 4 (severe): Secondary | ICD-10-CM | POA: Diagnosis present

## 2013-11-24 DIAGNOSIS — F329 Major depressive disorder, single episode, unspecified: Secondary | ICD-10-CM | POA: Diagnosis present

## 2013-11-24 DIAGNOSIS — Z6841 Body Mass Index (BMI) 40.0 and over, adult: Secondary | ICD-10-CM

## 2013-11-24 DIAGNOSIS — F4310 Post-traumatic stress disorder, unspecified: Secondary | ICD-10-CM | POA: Diagnosis present

## 2013-11-24 DIAGNOSIS — J209 Acute bronchitis, unspecified: Secondary | ICD-10-CM | POA: Diagnosis present

## 2013-11-24 DIAGNOSIS — F3289 Other specified depressive episodes: Secondary | ICD-10-CM | POA: Diagnosis present

## 2013-11-24 DIAGNOSIS — R599 Enlarged lymph nodes, unspecified: Secondary | ICD-10-CM

## 2013-11-24 DIAGNOSIS — Z79899 Other long term (current) drug therapy: Secondary | ICD-10-CM

## 2013-11-24 DIAGNOSIS — Z923 Personal history of irradiation: Secondary | ICD-10-CM

## 2013-11-24 DIAGNOSIS — J9601 Acute respiratory failure with hypoxia: Secondary | ICD-10-CM | POA: Diagnosis present

## 2013-11-24 DIAGNOSIS — I10 Essential (primary) hypertension: Secondary | ICD-10-CM

## 2013-11-24 DIAGNOSIS — G8929 Other chronic pain: Secondary | ICD-10-CM | POA: Diagnosis present

## 2013-11-24 LAB — CBC WITH DIFFERENTIAL/PLATELET
BASOS ABS: 0 10*3/uL (ref 0.0–0.1)
Basophils Relative: 0 % (ref 0–1)
EOS ABS: 0 10*3/uL (ref 0.0–0.7)
EOS PCT: 0 % (ref 0–5)
HCT: 38.8 % — ABNORMAL LOW (ref 39.0–52.0)
Hemoglobin: 12.4 g/dL — ABNORMAL LOW (ref 13.0–17.0)
Lymphocytes Relative: 7 % — ABNORMAL LOW (ref 12–46)
Lymphs Abs: 1.1 10*3/uL (ref 0.7–4.0)
MCH: 27.6 pg (ref 26.0–34.0)
MCHC: 32 g/dL (ref 30.0–36.0)
MCV: 86.2 fL (ref 78.0–100.0)
Monocytes Absolute: 1.2 10*3/uL — ABNORMAL HIGH (ref 0.1–1.0)
Monocytes Relative: 8 % (ref 3–12)
Neutro Abs: 12.5 10*3/uL — ABNORMAL HIGH (ref 1.7–7.7)
Neutrophils Relative %: 85 % — ABNORMAL HIGH (ref 43–77)
PLATELETS: 153 10*3/uL (ref 150–400)
RBC: 4.5 MIL/uL (ref 4.22–5.81)
RDW: 15.9 % — AB (ref 11.5–15.5)
WBC: 14.8 10*3/uL — AB (ref 4.0–10.5)

## 2013-11-24 LAB — URINALYSIS, ROUTINE W REFLEX MICROSCOPIC
BILIRUBIN URINE: NEGATIVE
Glucose, UA: NEGATIVE mg/dL
Ketones, ur: NEGATIVE mg/dL
Leukocytes, UA: NEGATIVE
Nitrite: NEGATIVE
Protein, ur: 100 mg/dL — AB
Specific Gravity, Urine: 1.014 (ref 1.005–1.030)
UROBILINOGEN UA: 0.2 mg/dL (ref 0.0–1.0)
pH: 6 (ref 5.0–8.0)

## 2013-11-24 LAB — BASIC METABOLIC PANEL
BUN: 40 mg/dL — ABNORMAL HIGH (ref 6–23)
CALCIUM: 9 mg/dL (ref 8.4–10.5)
CO2: 29 mEq/L (ref 19–32)
Chloride: 97 mEq/L (ref 96–112)
Creatinine, Ser: 2.3 mg/dL — ABNORMAL HIGH (ref 0.50–1.35)
GFR calc Af Amer: 32 mL/min — ABNORMAL LOW (ref >90)
GFR, EST NON AFRICAN AMERICAN: 28 mL/min — AB (ref >90)
Glucose, Bld: 137 mg/dL — ABNORMAL HIGH (ref 70–99)
Potassium: 4.5 mEq/L (ref 3.7–5.3)
SODIUM: 139 meq/L (ref 137–147)

## 2013-11-24 LAB — URINE MICROSCOPIC-ADD ON

## 2013-11-24 LAB — I-STAT CG4 LACTIC ACID, ED: LACTIC ACID, VENOUS: 0.49 mmol/L — AB (ref 0.5–2.2)

## 2013-11-24 MED ORDER — ENOXAPARIN SODIUM 30 MG/0.3ML ~~LOC~~ SOLN: 30.0000 mg | SUBCUTANEOUS | Status: DC

## 2013-11-24 MED ORDER — SERTRALINE HCL 50 MG PO TABS
150.0000 mg | ORAL_TABLET | Freq: Every day | ORAL | Status: DC
  Administered 2013-11-24: 150 mg via ORAL
  Filled 2013-11-24 (×2): qty 1

## 2013-11-24 MED ORDER — ENOXAPARIN SODIUM 60 MG/0.6ML ~~LOC~~ SOLN
60.0000 mg | Freq: Every day | SUBCUTANEOUS | Status: DC
  Administered 2013-11-24: 60 mg via SUBCUTANEOUS
  Filled 2013-11-24 (×2): qty 0.6

## 2013-11-24 MED ORDER — TAMSULOSIN HCL 0.4 MG PO CAPS
0.4000 mg | ORAL_CAPSULE | Freq: Every day | ORAL | Status: DC
  Administered 2013-11-25: 0.4 mg via ORAL
  Filled 2013-11-24: qty 1

## 2013-11-24 MED ORDER — QUETIAPINE FUMARATE 25 MG PO TABS
125.0000 mg | ORAL_TABLET | Freq: Every day | ORAL | Status: DC
  Administered 2013-11-24: 125 mg via ORAL
  Filled 2013-11-24 (×2): qty 1

## 2013-11-24 MED ORDER — TIZANIDINE HCL 4 MG PO TABS
4.0000 mg | ORAL_TABLET | Freq: Every evening | ORAL | Status: DC | PRN
  Filled 2013-11-24: qty 1

## 2013-11-24 MED ORDER — IPRATROPIUM-ALBUTEROL 0.5-2.5 (3) MG/3ML IN SOLN
3.0000 mL | Freq: Four times a day (QID) | RESPIRATORY_TRACT | Status: DC
  Administered 2013-11-25 (×2): 3 mL via RESPIRATORY_TRACT
  Filled 2013-11-24 (×2): qty 3

## 2013-11-24 MED ORDER — LEVOTHYROXINE SODIUM 100 MCG PO TABS
100.0000 ug | ORAL_TABLET | Freq: Every day | ORAL | Status: DC
  Administered 2013-11-25: 100 ug via ORAL
  Filled 2013-11-24 (×2): qty 1

## 2013-11-24 MED ORDER — SODIUM CHLORIDE 0.9 % IJ SOLN: 3.0000 mL | INTRAMUSCULAR | Status: DC | PRN

## 2013-11-24 MED ORDER — BISACODYL 5 MG PO TBEC: 5.0000 mg | DELAYED_RELEASE_TABLET | Freq: Every evening | ORAL | Status: DC | PRN

## 2013-11-24 MED ORDER — LEVOFLOXACIN IN D5W 750 MG/150ML IV SOLN: 750.0000 mg | INTRAVENOUS | Status: DC

## 2013-11-24 MED ORDER — PREDNISONE 20 MG PO TABS
60.0000 mg | ORAL_TABLET | Freq: Once | ORAL | Status: AC
  Administered 2013-11-24: 60 mg via ORAL
  Filled 2013-11-24: qty 3

## 2013-11-24 MED ORDER — SODIUM CHLORIDE 0.9 % IJ SOLN
3.0000 mL | Freq: Two times a day (BID) | INTRAMUSCULAR | Status: DC
  Administered 2013-11-24: 3 mL via INTRAVENOUS

## 2013-11-24 MED ORDER — ACETAMINOPHEN 650 MG RE SUPP: 650.0000 mg | Freq: Four times a day (QID) | RECTAL | Status: DC | PRN

## 2013-11-24 MED ORDER — GALANTAMINE HYDROBROMIDE 4 MG PO TABS
8.0000 mg | ORAL_TABLET | Freq: Two times a day (BID) | ORAL | Status: DC
  Filled 2013-11-24 (×3): qty 2

## 2013-11-24 MED ORDER — IPRATROPIUM BROMIDE 0.02 % IN SOLN
0.5000 mg | Freq: Once | RESPIRATORY_TRACT | Status: AC
  Administered 2013-11-24: 0.5 mg via RESPIRATORY_TRACT

## 2013-11-24 MED ORDER — ALBUTEROL SULFATE (2.5 MG/3ML) 0.083% IN NEBU: 2.5000 mg | INHALATION_SOLUTION | RESPIRATORY_TRACT | Status: DC | PRN

## 2013-11-24 MED ORDER — HYDROCOD POLST-CHLORPHEN POLST 10-8 MG/5ML PO LQCR: 5.0000 mL | Freq: Two times a day (BID) | ORAL | Status: DC | PRN

## 2013-11-24 MED ORDER — MORPHINE SULFATE 2 MG/ML IJ SOLN
2.0000 mg | INTRAMUSCULAR | Status: DC | PRN
Start: 2013-11-24 — End: 2013-11-25

## 2013-11-24 MED ORDER — METHYLPREDNISOLONE SODIUM SUCC 125 MG IJ SOLR
125.0000 mg | Freq: Once | INTRAMUSCULAR | Status: AC
  Administered 2013-11-24: 125 mg via INTRAVENOUS
  Filled 2013-11-24: qty 2

## 2013-11-24 MED ORDER — METOPROLOL TARTRATE 50 MG PO TABS
50.0000 mg | ORAL_TABLET | Freq: Two times a day (BID) | ORAL | Status: DC
  Administered 2013-11-24 – 2013-11-25 (×2): 50 mg via ORAL
  Filled 2013-11-24 (×3): qty 1

## 2013-11-24 MED ORDER — SIMVASTATIN 10 MG PO TABS
10.0000 mg | ORAL_TABLET | Freq: Every day | ORAL | Status: DC
  Filled 2013-11-24: qty 1

## 2013-11-24 MED ORDER — TRAZODONE HCL 50 MG PO TABS
50.0000 mg | ORAL_TABLET | Freq: Every evening | ORAL | Status: DC | PRN
  Filled 2013-11-24: qty 1

## 2013-11-24 MED ORDER — AMLODIPINE BESYLATE 10 MG PO TABS
10.0000 mg | ORAL_TABLET | Freq: Every day | ORAL | Status: DC
  Administered 2013-11-25: 10 mg via ORAL
  Filled 2013-11-24: qty 1

## 2013-11-24 MED ORDER — ALBUTEROL (5 MG/ML) CONTINUOUS INHALATION SOLN
15.0000 mg/h | INHALATION_SOLUTION | RESPIRATORY_TRACT | Status: AC
  Administered 2013-11-24: 15 mg/h via RESPIRATORY_TRACT

## 2013-11-24 MED ORDER — SODIUM CHLORIDE 0.9 % IV SOLN: 250.0000 mL | INTRAVENOUS | Status: DC | PRN

## 2013-11-24 MED ORDER — ACETAMINOPHEN 325 MG PO TABS: 650.0000 mg | ORAL_TABLET | Freq: Four times a day (QID) | ORAL | Status: DC | PRN

## 2013-11-24 MED ORDER — LEVOFLOXACIN IN D5W 750 MG/150ML IV SOLN
750.0000 mg | Freq: Once | INTRAVENOUS | Status: AC
  Administered 2013-11-24: 750 mg via INTRAVENOUS
  Filled 2013-11-24: qty 150

## 2013-11-24 MED ORDER — GALANTAMINE HYDROBROMIDE ER 16 MG PO CP24: 16.0000 mg | ORAL_CAPSULE | Freq: Every day | ORAL | Status: DC

## 2013-11-24 MED ORDER — HYDROCODONE-ACETAMINOPHEN 5-325 MG PO TABS: 1.0000 | ORAL_TABLET | ORAL | Status: DC | PRN

## 2013-11-24 MED ORDER — METHYLPREDNISOLONE SODIUM SUCC 125 MG IJ SOLR
60.0000 mg | Freq: Two times a day (BID) | INTRAMUSCULAR | Status: DC
  Administered 2013-11-24: 22:00:00 via INTRAVENOUS
  Administered 2013-11-25: 60 mg via INTRAVENOUS
  Filled 2013-11-24 (×2): qty 0.96

## 2013-11-24 MED ORDER — PANTOPRAZOLE SODIUM 40 MG PO TBEC
40.0000 mg | DELAYED_RELEASE_TABLET | Freq: Every day | ORAL | Status: DC
  Administered 2013-11-25: 40 mg via ORAL
  Filled 2013-11-24: qty 1

## 2013-11-24 MED ORDER — IPRATROPIUM-ALBUTEROL 0.5-2.5 (3) MG/3ML IN SOLN
3.0000 mL | RESPIRATORY_TRACT | Status: DC
  Administered 2013-11-24: 3 mL via RESPIRATORY_TRACT
  Filled 2013-11-24: qty 3

## 2013-11-24 MED ORDER — GABAPENTIN 300 MG PO CAPS
300.0000 mg | ORAL_CAPSULE | Freq: Every day | ORAL | Status: DC
  Administered 2013-11-24: 300 mg via ORAL
  Filled 2013-11-24 (×2): qty 1

## 2013-11-24 MED ORDER — ASPIRIN 81 MG PO CHEW
81.0000 mg | CHEWABLE_TABLET | Freq: Every day | ORAL | Status: DC
  Administered 2013-11-24: 81 mg via ORAL
  Filled 2013-11-24 (×2): qty 1
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
## 2013-11-24 NOTE — ED Notes (Signed)
Pt from Wesley [name]-per [name] dx with PNA around Feb 21st. Pt was tx at [facility] with a Zpack. Pt has some resolve with PNA s/sx, but have now returned. Pt seen at [facility] on Wednesday, dx with bronchitis. Pt returned today worse, temp 100.2. Pt is A&O and in NAD
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
## 2013-11-25 DIAGNOSIS — J189 Pneumonia, unspecified organism: Secondary | ICD-10-CM

## 2013-11-25 LAB — EXPECTORATED SPUTUM ASSESSMENT W GRAM STAIN, RFLX TO RESP C

## 2013-11-25 LAB — CBC
HEMATOCRIT: 37.8 % — AB (ref 39.0–52.0)
Hemoglobin: 11.8 g/dL — ABNORMAL LOW (ref 13.0–17.0)
MCH: 27.3 pg (ref 26.0–34.0)
MCHC: 31.2 g/dL (ref 30.0–36.0)
MCV: 87.5 fL (ref 78.0–100.0)
PLATELETS: 147 10*3/uL — AB (ref 150–400)
RBC: 4.32 MIL/uL (ref 4.22–5.81)
RDW: 16 % — AB (ref 11.5–15.5)
WBC: 14.6 10*3/uL — AB (ref 4.0–10.5)

## 2013-11-25 LAB — BASIC METABOLIC PANEL
BUN: 40 mg/dL — AB (ref 6–23)
CALCIUM: 9 mg/dL (ref 8.4–10.5)
CHLORIDE: 98 meq/L (ref 96–112)
CO2: 29 meq/L (ref 19–32)
CREATININE: 2.39 mg/dL — AB (ref 0.50–1.35)
GFR calc Af Amer: 30 mL/min — ABNORMAL LOW (ref >90)
GFR calc non Af Amer: 26 mL/min — ABNORMAL LOW (ref >90)
Glucose, Bld: 171 mg/dL — ABNORMAL HIGH (ref 70–99)
Potassium: 4.6 mEq/L (ref 3.7–5.3)
Sodium: 140 mEq/L (ref 137–147)

## 2013-11-25 LAB — EXPECTORATED SPUTUM ASSESSMENT W REFEX TO RESP CULTURE

## 2013-11-25 LAB — INFLUENZA PANEL BY PCR (TYPE A & B)
H1N1FLUPCR: NOT DETECTED
INFLAPCR: NEGATIVE
Influenza B By PCR: NEGATIVE

## 2013-11-25 MED ORDER — PREDNISONE 5 MG PO TABS: ORAL_TABLET | ORAL | Status: DC

## 2013-11-25 MED ORDER — TIOTROPIUM BROMIDE MONOHYDRATE 18 MCG IN CAPS: 18.0000 ug | ORAL_CAPSULE | Freq: Every day | RESPIRATORY_TRACT | Status: DC

## 2013-11-25 MED ORDER — LEVOFLOXACIN 750 MG PO TABS: 750.0000 mg | ORAL_TABLET | ORAL | Status: DC

## 2013-11-25 MED ORDER — ALBUTEROL SULFATE (2.5 MG/3ML) 0.083% IN NEBU: 2.5000 mg | INHALATION_SOLUTION | RESPIRATORY_TRACT | Status: DC | PRN

## 2013-11-25 NOTE — Discharge Instructions (Signed)
Follow with Primary MD [name], MD in 7 days   Get CBC, CMP, checked 7 days by Primary MD and again as instructed by your Primary MD. Get a 2 view Chest X ray done next visit.   Activity: As tolerated with Full fall precautions use walker/cane & assistance as needed   Disposition Home     Diet: Heart Healthy    For Heart failure patients - Check your Weight same time everyday, if you gain over 2 pounds, or you develop in leg swelling, experience more shortness of breath or chest pain, call your Primary MD immediately. Follow Cardiac Low Salt Diet and 1.8 lit/day fluid restriction.   On your next visit with her primary care physician please Get Medicines reviewed and adjusted.  Please request your Prim.MD to go over all Hospital Tests and Procedure/Radiological results at the follow up, please get all Hospital records sent to your Prim MD by signing hospital release before you go home.   If you experience worsening of your admission symptoms, develop shortness of breath, life threatening emergency, suicidal or homicidal thoughts you must seek medical attention immediately by calling 911 or calling your MD immediately  if symptoms less severe.  You Must read complete instructions/literature along with all the possible adverse reactions/side effects for all the Medicines you take and that have been prescribed to you. Take any new Medicines after you have completely understood and accpet all the possible adverse reactions/side effects.   Do not drive and provide baby sitting services if your were admitted for syncope or siezures until you have seen by Primary MD or a Neurologist and advised to do so again.  Do not drive when taking Pain medications.    Do not take more than prescribed Pain, Sleep and Anxiety Medications  Special Instructions: If you have smoked or chewed Tobacco  in the last 2 yrs please stop smoking, stop any regular Alcohol  and or any Recreational drug  use.  Wear Seat belts while driving.   Please note  You were cared for by a hospitalist during your hospital stay. If you have any questions about your discharge medications or the care you received while you were in the hospital after you are discharged, you can call the unit and asked to speak with the hospitalist on call if the hospitalist that took care of you is not available. Once you are discharged, your primary care physician will handle any further medical issues. Please note that NO REFILLS for any discharge medications will be authorized once you are discharged, as it is imperative that you return to your primary care physician (or establish a relationship with a primary care physician if you do not have one) for your aftercare needs so that they can reassess your need for medications and monitor your lab values.
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
## 2013-11-25 NOTE — Care Management Note (Unsigned)
    Page 1 of 1   11/25/2013     3:38:15 PM   CARE MANAGEMENT NOTE 11/25/2013  Patient:  Wesley [name], Wesley [name]   Account Number:  [id number]  Date Initiated:  11/25/2013  Documentation initiated by:  [facility]  Subjective/Objective Assessment:   68 year old male admitted with respiratory failure.     Action/Plan:   From home.   Anticipated DC Date:  11/25/2013   Anticipated DC Plan:  [place]  CM consult      Choice offered to / List presented to:     DME arranged  NEBULIZER MACHINE      DME agency  [facility]        Status of service:  In process, will continue to follow Medicare Important Message given?  NA - LOS <3 / Initial given by admissions (If response is "NO", the following Medicare IM given date fields will be blank) Date Medicare IM given:   Date Additional Medicare IM given:    Discharge Disposition:  HOME/SELF CARE  Per UR Regulation:  Reviewed for med. necessity/level of care/duration of stay  If discussed at [place] of Stay Meetings, dates discussed:    Comments:

## 2013-11-27 LAB — CULTURE, RESPIRATORY: CULTURE: NORMAL

## 2013-11-27 LAB — CULTURE, RESPIRATORY W GRAM STAIN

## 2013-11-30 LAB — CULTURE, BLOOD (ROUTINE X 2)
CULTURE: NO GROWTH
Culture: NO GROWTH

## 2013-12-03 ENCOUNTER — Ambulatory Visit (INDEPENDENT_AMBULATORY_CARE_PROVIDER_SITE_OTHER)
Admission: RE | Admit: 2013-12-03 | Discharge: 2013-12-03 | Disposition: A | Discharge status: Home / Self Care | Payer: Medicare Other | Source: Ambulatory Visit | Attending: Adult Health | Admitting: Adult Health

## 2013-12-03 ENCOUNTER — Other Ambulatory Visit: Payer: Self-pay | Admitting: Adult Health

## 2013-12-03 ENCOUNTER — Ambulatory Visit (INDEPENDENT_AMBULATORY_CARE_PROVIDER_SITE_OTHER): Payer: Medicare Other | Admitting: Adult Health

## 2013-12-03 ENCOUNTER — Encounter: Payer: Self-pay | Admitting: Adult Health

## 2013-12-03 VITALS — BP 144/76 | HR 60 | Temp 97.9°F | Ht 69.0 in | Wt 297.4 lb

## 2013-12-03 DIAGNOSIS — J9601 Acute respiratory failure with hypoxia: Secondary | ICD-10-CM

## 2013-12-03 DIAGNOSIS — J189 Pneumonia, unspecified organism: Secondary | ICD-10-CM

## 2013-12-03 DIAGNOSIS — J9600 Acute respiratory failure, unspecified whether with hypoxia or hypercapnia: Secondary | ICD-10-CM

## 2013-12-03 NOTE — Patient Instructions (Signed)
Finish Levaquin as directed.  May stop Spiriva .  May use Albuterol Neb As needed   follow up Dr. Halford Chessman  In 4 weeks and As needed  W/ chest xray .  Please contact office for sooner follow up if symptoms do not improve or worsen or seek emergency care

## 2013-12-05 NOTE — Assessment & Plan Note (Signed)
Recent admission with w/ Acute Bronchitis with Hypoxia in setting of OSA/OHS on nocturnal O2 (intolerant to BIPAP )  -will stop spiriva for now , previous spirometry showed restrictive pattern w/ no airflow obstruction.    Plan  Finish Levaquin as directed.  May stop Spiriva .  May use Albuterol Neb As needed   follow up Dr. Halford Chessman  In 4 weeks and As needed  W/ chest xray .  Please contact office for sooner follow up if symptoms do not improve or worsen or seek emergency care

## 2013-12-05 NOTE — Progress Notes (Signed)
Subjective:    Patient ID: Wesley [name], male    DOB: 27-Sep-1945, 68 y.o.   MRN: [id number]  HPI 68 yo male with  OSA/OHS on 2.5 liters oxygen qhs.  He is intolerant of CPAP/BiPAP.   12/03/13 [facility] follow up   patient returns for a post hospital followup. Patient was admitted March 15 through March 16, for acute respiratory failure with hypoxia, likely secondary to acute bronchitis. Patient was treated with IV antibiotics, steroids. And nebulized bronchodilators.   Chest x-ray showed left basilar scarring , and elevated left hemidiaphragm. There was some question of patient having some underlying COPD and was started on Spiriva. Prior to discharge. Of note, patient is a never smoker. Since discharge. Patient does feel improved. Reports breathing is doing well, no new complaints.  has 1 dose left of Levaquin, still taking pred taper. Patient denies any hemoptysis, orthopnea, PND, or leg swelling. Previous spirometry showed restrictive pattern w/ no airflow obstruction.   TESTS: PSG 06/03/13 >> AHI 5, SpO2 low 82%, REM 16. 101 min with SpO2 < 88%. Needed 1 liter oxygen. ONO with RA 06/05/13 >> Test time 8 hrs 31 min. Mean SpO2 84%, low SpO2 59%. Spent 8 hrs 1 min with SpO2 < 88%. ONO with 2 liters 06/27/13 >> Test time 7 hrs 25 min. Basal SpO2 91.9%, low SpO2 76%. Spent 11 min with SpO2 < 88%.     Review of Systems Constitutional:   No  weight loss, night sweats,  Fevers, chills, fatigue, or  lassitude.  HEENT:   No headaches,  Difficulty swallowing,  Tooth/dental problems, or  Sore throat,                No sneezing, itching, ear ache,  +nasal congestion, post nasal drip,   CV:  No chest pain,  Orthopnea, PND, swelling in lower extremities, anasarca, dizziness, palpitations, syncope.   GI  No heartburn, indigestion, abdominal pain, nausea, vomiting, diarrhea, change in bowel habits, loss of appetite, bloody stools.   Resp:    No chest wall deformity  Skin: no rash or  lesions.  GU: no dysuria, change in color of urine, no urgency or frequency.  No flank pain, no hematuria   MS:  No joint pain or swelling.  No decreased range of motion.  No back pain.  Psych:  No change in mood or affect. No depression or anxiety.  No memory loss.         Objective:   Physical Exam GEN: A/Ox3; pleasant , NAD, elderly   HEENT:  [place]/AT,  EACs-clear, TMs-wnl, NOSE-clear, THROAT-clear, no lesions, no postnasal drip or exudate noted.   NECK:  Supple w/ fair ROM; no JVD; normal carotid impulses w/o bruits; no thyromegaly or nodules palpated; no lymphadenopathy.  RESP  Diminished BS in bases , w/o, wheezes/ rales/ or rhonchi.no accessory muscle use, no dullness to percussion  CARD:  RRR, no m/r/g  , no peripheral edema, pulses intact, no cyanosis or clubbing.  GI:   Soft & nt; nml bowel sounds; no organomegaly or masses detected.  Musco: Warm bil, no deformities or joint swelling noted.   Neuro: alert, no focal deficits noted.    Skin: Warm, no lesions or rashes    CXR 12/03/13  There has been interval resolution of pulmonary vascular congestion with mild interstitial edema. There remains density at the left lung base laterally which may reflect scarring. When the patient can tolerate the procedure, a PA and lateral chest x-ray  would be of value.       Assessment & Plan:

## 2013-12-05 NOTE — Progress Notes (Signed)
Reviewed and agree with assessment/plan. 

## 2013-12-23 ENCOUNTER — Ambulatory Visit (INDEPENDENT_AMBULATORY_CARE_PROVIDER_SITE_OTHER): Payer: Medicare Other | Admitting: Cardiovascular Disease

## 2013-12-23 ENCOUNTER — Encounter: Payer: Self-pay | Admitting: Cardiovascular Disease

## 2013-12-23 VITALS — BP 110/80 | HR 62 | Ht 70.0 in | Wt 295.0 lb

## 2013-12-23 DIAGNOSIS — I359 Nonrheumatic aortic valve disorder, unspecified: Secondary | ICD-10-CM

## 2013-12-23 DIAGNOSIS — I10 Essential (primary) hypertension: Secondary | ICD-10-CM

## 2013-12-23 NOTE — Patient Instructions (Signed)
Your physician has requested that you have an echocardiogram in September 2015. Echocardiography is a painless test that uses sound waves to create images of your heart. It provides your doctor with information about the size and shape of your heart and how well your heart's chambers and valves are working. This procedure takes approximately one hour. There are no restrictions for this procedure.  Your physician wants you to follow-up in: 1 YEAR with Dr Burt Knack.  You will receive a reminder letter in the mail two months in advance. If you don't receive a letter, please call our office to schedule the follow-up appointment.  Your physician recommends that you continue on your current medications as directed. Please refer to the Current Medication list given to you today.
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
## 2014-01-01 ENCOUNTER — Ambulatory Visit: Payer: Medicare Other | Admitting: Pulmonary Disease

## 2014-01-08 ENCOUNTER — Ambulatory Visit: Payer: Medicare Other | Admitting: Pulmonary Disease

## 2014-03-05 ENCOUNTER — Telehealth: Payer: Self-pay | Admitting: Hematology and Oncology

## 2014-03-05 NOTE — Telephone Encounter (Signed)
returned pt call to r/s appt....pt could not talk due to being at dr...will call back to r/s

## 2014-03-06 ENCOUNTER — Telehealth: Payer: Self-pay | Admitting: Hematology and Oncology

## 2014-03-06 NOTE — Telephone Encounter (Signed)
WIFE CALLED AND R/S 8/11 APPT TO 8/7. WIFE HAS NEW D/T.

## 2014-03-10 ENCOUNTER — Ambulatory Visit: Payer: Medicare Other | Admitting: Pulmonary Disease

## 2014-03-26 ENCOUNTER — Ambulatory Visit (INDEPENDENT_AMBULATORY_CARE_PROVIDER_SITE_OTHER): Payer: Medicare Other | Admitting: Pulmonary Disease

## 2014-03-26 ENCOUNTER — Encounter: Payer: Self-pay | Admitting: Pulmonary Disease

## 2014-03-26 VITALS — BP 142/78 | HR 88 | Temp 97.0°F | Ht 69.0 in | Wt 297.0 lb

## 2014-03-26 DIAGNOSIS — E678 Other specified hyperalimentation: Secondary | ICD-10-CM
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
## 2014-03-26 NOTE — Patient Instructions (Signed)
Follow-up in one year.

## 2014-03-30 NOTE — Assessment & Plan Note (Signed)
He is to continue 2.5 liters oxygen at night.

## 2014-04-18 ENCOUNTER — Other Ambulatory Visit (HOSPITAL_BASED_OUTPATIENT_CLINIC_OR_DEPARTMENT_OTHER): Payer: Medicare Other

## 2014-04-18 ENCOUNTER — Ambulatory Visit (HOSPITAL_BASED_OUTPATIENT_CLINIC_OR_DEPARTMENT_OTHER): Payer: Medicare Other | Admitting: Hematology and Oncology

## 2014-04-18 ENCOUNTER — Encounter: Payer: Self-pay | Admitting: Hematology and Oncology

## 2014-04-18 VITALS — BP 127/67 | HR 62 | Temp 98.2°F | Resp 20 | Ht 69.0 in | Wt 298.9 lb

## 2014-04-18 DIAGNOSIS — E039 Hypothyroidism, unspecified: Secondary | ICD-10-CM

## 2014-04-18 DIAGNOSIS — D631 Anemia in chronic kidney disease: Secondary | ICD-10-CM

## 2014-04-18 DIAGNOSIS — N039 Chronic nephritic syndrome with unspecified morphologic changes: Secondary | ICD-10-CM

## 2014-04-18 DIAGNOSIS — C099 Malignant neoplasm of tonsil, unspecified: Secondary | ICD-10-CM

## 2014-04-18 DIAGNOSIS — I359 Nonrheumatic aortic valve disorder, unspecified: Secondary | ICD-10-CM

## 2014-04-18 DIAGNOSIS — N183 Chronic kidney disease, stage 3 unspecified: Secondary | ICD-10-CM

## 2014-04-18 DIAGNOSIS — N189 Chronic kidney disease, unspecified: Secondary | ICD-10-CM

## 2014-04-18 DIAGNOSIS — R609 Edema, unspecified: Secondary | ICD-10-CM

## 2014-04-18 DIAGNOSIS — C109 Malignant neoplasm of oropharynx, unspecified: Secondary | ICD-10-CM

## 2014-04-18 DIAGNOSIS — R600 Localized edema: Secondary | ICD-10-CM

## 2014-04-18 DIAGNOSIS — I350 Nonrheumatic aortic (valve) stenosis: Secondary | ICD-10-CM

## 2014-04-18 LAB — CBC WITH DIFFERENTIAL/PLATELET
BASO%: 0.7 % (ref 0.0–2.0)
BASOS ABS: 0.1 10*3/uL (ref 0.0–0.1)
EOS ABS: 0.1 10*3/uL (ref 0.0–0.5)
EOS%: 1.2 % (ref 0.0–7.0)
HCT: 35.3 % — ABNORMAL LOW (ref 38.4–49.9)
HEMOGLOBIN: 11.1 g/dL — AB (ref 13.0–17.1)
LYMPH%: 9.9 % — ABNORMAL LOW (ref 14.0–49.0)
MCH: 27.2 pg (ref 27.2–33.4)
MCHC: 31.6 g/dL — ABNORMAL LOW (ref 32.0–36.0)
MCV: 86.1 fL (ref 79.3–98.0)
MONO#: 0.6 10*3/uL (ref 0.1–0.9)
MONO%: 5.3 % (ref 0.0–14.0)
NEUT%: 82.9 % — ABNORMAL HIGH (ref 39.0–75.0)
NEUTROS ABS: 8.7 10*3/uL — AB (ref 1.5–6.5)
PLATELETS: 225 10*3/uL (ref 140–400)
RBC: 4.09 10*6/uL — AB (ref 4.20–5.82)
RDW: 15.6 % — ABNORMAL HIGH (ref 11.0–14.6)
WBC: 10.5 10*3/uL — AB (ref 4.0–10.3)
lymph#: 1 10*3/uL (ref 0.9–3.3)

## 2014-04-18 LAB — COMPREHENSIVE METABOLIC PANEL (CC13)
ALBUMIN: 3.4 g/dL — AB (ref 3.5–5.0)
ALT: 9 U/L (ref 0–55)
AST: 10 U/L (ref 5–34)
Alkaline Phosphatase: 111 U/L (ref 40–150)
Anion Gap: 10 mEq/L (ref 3–11)
BUN: 44.5 mg/dL — AB (ref 7.0–26.0)
CO2: 30 meq/L — AB (ref 22–29)
Calcium: 9 mg/dL (ref 8.4–10.4)
Chloride: 102 mEq/L (ref 98–109)
Creatinine: 2.6 mg/dL — ABNORMAL HIGH (ref 0.7–1.3)
GLUCOSE: 127 mg/dL (ref 70–140)
POTASSIUM: 4.3 meq/L (ref 3.5–5.1)
SODIUM: 142 meq/L (ref 136–145)
TOTAL PROTEIN: 7 g/dL (ref 6.4–8.3)
Total Bilirubin: 0.42 mg/dL (ref 0.20–1.20)

## 2014-04-18 LAB — TSH CHCC: TSH: 4.197 m[IU]/L — AB (ref 0.320–4.118)

## 2014-04-18 NOTE — Assessment & Plan Note (Signed)
Clinically, he has no evidence of disease recurrence. I would discharge him from the clinic and recommend ENT followup only.

## 2014-04-18 NOTE — Assessment & Plan Note (Signed)
This could be related to heart failure. He continues followup with his cardiologist.

## 2014-04-18 NOTE — Assessment & Plan Note (Signed)
This is likely anemia of chronic disease. The patient denies recent history of bleeding such as epistaxis, hematuria or hematochezia. He is asymptomatic from the anemia. We will observe for now.  

## 2014-04-18 NOTE — Assessment & Plan Note (Signed)
Thyroid function tests is pending. He continues thyroid replacement therapy.

## 2014-04-18 NOTE — Assessment & Plan Note (Signed)
He is not symptomatic. He will continue followup with his cardiologist.

## 2014-04-18 NOTE — Assessment & Plan Note (Signed)
His kidney function is stable. He will continue followup with his nephrologist.
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
## 2014-04-22 ENCOUNTER — Other Ambulatory Visit: Payer: Medicare Other

## 2014-04-22 ENCOUNTER — Ambulatory Visit: Payer: Medicare Other | Admitting: Hematology and Oncology

## 2014-04-30 ENCOUNTER — Encounter: Payer: Self-pay | Admitting: Cardiovascular Disease

## 2014-04-30 NOTE — Telephone Encounter (Signed)
**Note De-Identified  Obfuscation** The pts wife, Santiago Glad, is advised that Im sending this message to Dr York Cerise nurse Ander Purpura to address when she returns to the office tomorrow. Santiago Glad verbalized understanding.

## 2014-04-30 NOTE — Telephone Encounter (Signed)
follw up appointment:    Per pt's wife pt needs a follow up appointment to his Echo 9/14 a week later per Dr Burt Knack.  Please give her a call when done.

## 2014-05-01 NOTE — Telephone Encounter (Signed)
This encounter was created in error - please disregard.

## 2014-05-12 ENCOUNTER — Telehealth: Payer: Self-pay | Admitting: Pulmonary Disease

## 2014-05-12 DIAGNOSIS — E678 Other specified hyperalimentation: Secondary | ICD-10-CM

## 2014-05-12 NOTE — Telephone Encounter (Signed)
OK by me to send order 

## 2014-05-12 NOTE — Telephone Encounter (Signed)
Santiago Glad is aware order placed. Nothing further needed

## 2014-05-12 NOTE — Telephone Encounter (Signed)
Will need to speak with libby about this order to get an order for pt to qualify for the oxygen.  Will leave this in triage.

## 2014-05-12 NOTE — Telephone Encounter (Signed)
Pt's spouse is calling back regarding the same.  Pt's spouse states that pt is having issues during the daytime as well as nighttime.  Pt's spouse states that [facility] is requesting an order to complete an assessment at pt's home to determine whether or not daytime O2 is needed.  Pt's spouse can be reached at [phone].  Satira Anis

## 2014-05-12 NOTE — Telephone Encounter (Signed)
Called and spoke with Wesley [name] and she stated that the pt is needing an order sent to [facility] to get the POC for the pt.  She stated that he has been using 2 liters at bedtime and has a unit that is kept upstairs for him to use at night, but she stated that the pts oxygen level was at 78% yesterday while he was up walking around.  Pt wants to get a POC to keep downstairs to use during the day as well.  VS is not in the office today.  Will forward to DOD to advise. Thanks  No Known Allergies  Current Outpatient Prescriptions on File Prior to Visit  Medication Sig Dispense Refill  . amLODipine (NORVASC) 10 MG tablet Take 10 mg by mouth daily.        [name] aspirin 81 MG chewable tablet Chew 81 mg by mouth at bedtime.      . bisacodyl (DULCOLAX) 5 MG EC tablet Take 5 mg by mouth at bedtime as needed for mild constipation or moderate constipation.      . Cholecalciferol (VITAMIN D3) 1000 UNITS CAPS Take by mouth. Once a day       . gabapentin (NEURONTIN) 300 MG capsule Take 300 mg by mouth at bedtime.       [name] galantamine (RAZADYNE ER) 16 MG 24 hr capsule Take 16 mg by mouth at bedtime.       [name] levothyroxine (SYNTHROID, LEVOTHROID) 100 MCG tablet Take 100 mcg by mouth daily before breakfast.      . metoprolol (LOPRESSOR) 50 MG tablet 1 twice a day      . omeprazole (PRILOSEC) 20 MG capsule 1 tablet twice a day      . pravastatin (PRAVACHOL) 20 MG tablet Take 20 mg by mouth at bedtime.      [name] QUEtiapine (SEROQUEL) 100 MG tablet Take 125 mg by mouth at bedtime.      . sertraline (ZOLOFT) 100 MG tablet Take 150 mg by mouth at bedtime.      . Tamsulosin HCl (FLOMAX) 0.4 MG CAPS Once a day      . tiZANidine (ZANAFLEX) 4 MG capsule Take 1 capsule by mouth at bedtime as needed. Muscle spasms       No current facility-administered medications on file prior to visit.

## 2014-05-12 NOTE — Telephone Encounter (Signed)
Per Corene Cornea, [employer] will not pay for a 2nd concentrator.  They can provide it, but will be out of pocket.  Wesley [name]

## 2014-05-13 NOTE — Telephone Encounter (Signed)
Wesley [name], [facility] called & states that they are not able to complete an assessment to determine use of O2.  Pt will need to have an assessment completed w/ our office.  As it currently stands, pt does not qualify.  Satira Anis

## 2014-05-13 NOTE — Telephone Encounter (Signed)
Called spoke with daughter. She scheduled an appt to see TP as pt daytime sats have been 78%. Nothing further needed

## 2014-05-13 NOTE — Telephone Encounter (Signed)
Pt's wife calling again to check on the status of  A new consentrator please advise.[name]

## 2014-05-13 NOTE — Telephone Encounter (Signed)
LMTCB x 1 

## 2014-05-13 NOTE — Telephone Encounter (Signed)
Wesley [name] returning call to nurse- [phone]

## 2014-05-16 ENCOUNTER — Ambulatory Visit (INDEPENDENT_AMBULATORY_CARE_PROVIDER_SITE_OTHER)
Admission: RE | Admit: 2014-05-16 | Discharge: 2014-05-16 | Disposition: A | Discharge status: Home / Self Care | Payer: Medicare Other | Source: Ambulatory Visit | Attending: Adult Health | Admitting: Adult Health

## 2014-05-16 ENCOUNTER — Ambulatory Visit (INDEPENDENT_AMBULATORY_CARE_PROVIDER_SITE_OTHER): Payer: Medicare Other | Admitting: Adult Health

## 2014-05-16 ENCOUNTER — Encounter: Payer: Self-pay | Admitting: Adult Health

## 2014-05-16 ENCOUNTER — Other Ambulatory Visit (INDEPENDENT_AMBULATORY_CARE_PROVIDER_SITE_OTHER): Payer: Medicare Other

## 2014-05-16 VITALS — BP 132/68 | HR 58 | Temp 98.4°F | Ht 69.0 in | Wt 300.2 lb

## 2014-05-16 DIAGNOSIS — J9621 Acute and chronic respiratory failure with hypoxia: Secondary | ICD-10-CM

## 2014-05-16 DIAGNOSIS — R0989 Other specified symptoms and signs involving the circulatory and respiratory systems: Secondary | ICD-10-CM

## 2014-05-16 DIAGNOSIS — R0600 Dyspnea, unspecified: Secondary | ICD-10-CM

## 2014-05-16 DIAGNOSIS — R0609 Other forms of dyspnea: Secondary | ICD-10-CM

## 2014-05-16 DIAGNOSIS — J9620 Acute and chronic respiratory failure, unspecified whether with hypoxia or hypercapnia: Secondary | ICD-10-CM

## 2014-05-16 DIAGNOSIS — R0902 Hypoxemia: Secondary | ICD-10-CM

## 2014-05-16 LAB — BASIC METABOLIC PANEL
BUN: 31 mg/dL — ABNORMAL HIGH (ref 6–23)
CALCIUM: 8.5 mg/dL (ref 8.4–10.5)
CO2: 32 mEq/L (ref 19–32)
Chloride: 100 mEq/L (ref 96–112)
Creatinine, Ser: 2.8 mg/dL — ABNORMAL HIGH (ref 0.4–1.5)
GFR: 24.41 mL/min — ABNORMAL LOW (ref >60.00)
Glucose, Bld: 94 mg/dL (ref 70–99)
Potassium: 4.6 mEq/L (ref 3.5–5.1)
Sodium: 140 mEq/L (ref 135–145)

## 2014-05-16 LAB — CBC WITH DIFFERENTIAL/PLATELET
BASOS ABS: 0.1 10*3/uL (ref 0.0–0.1)
Basophils Relative: 0.6 % (ref 0.0–3.0)
Eosinophils Absolute: 0.2 10*3/uL (ref 0.0–0.7)
Eosinophils Relative: 1.8 % (ref 0.0–5.0)
HEMATOCRIT: 33.2 % — AB (ref 39.0–52.0)
Hemoglobin: 10.5 g/dL — ABNORMAL LOW (ref 13.0–17.0)
LYMPHS ABS: 1.6 10*3/uL (ref 0.7–4.0)
LYMPHS PCT: 15.9 % (ref 12.0–46.0)
MCHC: 31.6 g/dL (ref 30.0–36.0)
MCV: 83.6 fl (ref 78.0–100.0)
Monocytes Absolute: 0.8 10*3/uL (ref 0.1–1.0)
Monocytes Relative: 7.6 % (ref 3.0–12.0)
Neutro Abs: 7.4 10*3/uL (ref 1.4–7.7)
Neutrophils Relative %: 74.1 % (ref 43.0–77.0)
Platelets: 312 10*3/uL (ref 150.0–400.0)
RBC: 3.97 Mil/uL — ABNORMAL LOW (ref 4.22–5.81)
RDW: 15.3 % (ref 11.5–15.5)
WBC: 10.1 10*3/uL (ref 4.0–10.5)

## 2014-05-16 LAB — BRAIN NATRIURETIC PEPTIDE: Pro B Natriuretic peptide (BNP): 206 pg/mL — ABNORMAL HIGH (ref 0.0–100.0)
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
## 2014-05-16 NOTE — Patient Instructions (Signed)
Begin Oxygen 3l/m continuous flow 24/7 .  Chest xray and labs today  Follow up for 2 D echo next week as planned.  Follow up Dr. Halford Chessman  In 2 weeks and As needed   Please contact office for sooner follow up if symptoms do not improve or worsen or seek emergency care

## 2014-05-17 NOTE — Assessment & Plan Note (Addendum)
Pt with now exertional desats ? Etiology -does not appear to be in any distress -will hold on admission for now and workup in OP setting as he already has home O2. DME to deliver portable system today . O2 tank sent with pt home .  Suspect is multifactoral with underlying OHS , Diastolic dysfunction  ,  PAH  Does not appear in fluid overload however at risk with DD and PAH  Concern for worsening hypercarbia however no mentation issues  Possible underlying infection with hx of dsyphagia w/ XRT for throat cancer and esophageal surgery -no fever , check cxr and wbc  Echo is pending next week ? Worsening PAH +/- aortic valve stenosis /CHF , if right side strain evident consider VQ scan as he is unable to CT scan d/t chronic renal failure  Exam unrevealng w/ neg chest pain , hemoptysis or calf pain /tenderness.   Plan  Begin Oxygen 3l/m continuous flow 24/7 .  Chest xray and labs today  Follow up for 2 D echo next week as planned.  Follow up Dr. Halford Chessman  In 2 weeks and As needed   Please contact office for sooner follow up if symptoms do not improve or worsen or seek emergency care

## 2014-05-17 NOTE — Progress Notes (Signed)
Reviewed and agree with assessment/plan. 

## 2014-05-20 ENCOUNTER — Other Ambulatory Visit: Payer: Self-pay | Admitting: Adult Health

## 2014-05-20 DIAGNOSIS — R0902 Hypoxemia: Secondary | ICD-10-CM

## 2014-05-20 DIAGNOSIS — R0600 Dyspnea, unspecified: Secondary | ICD-10-CM
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
## 2014-05-21 ENCOUNTER — Ambulatory Visit (HOSPITAL_COMMUNITY)
Admission: RE | Admit: 2014-05-21 | Discharge: 2014-05-21 | Disposition: A | Discharge status: Home / Self Care | Payer: Medicare Other | Source: Ambulatory Visit | Attending: Cardiology | Admitting: Cardiology

## 2014-05-21 ENCOUNTER — Ambulatory Visit (HOSPITAL_BASED_OUTPATIENT_CLINIC_OR_DEPARTMENT_OTHER)
Admission: RE | Admit: 2014-05-21 | Discharge: 2014-05-21 | Disposition: A | Discharge status: Home / Self Care | Payer: Medicare Other | Source: Ambulatory Visit | Attending: Adult Health | Admitting: Adult Health

## 2014-05-21 DIAGNOSIS — I10 Essential (primary) hypertension: Secondary | ICD-10-CM | POA: Diagnosis not present

## 2014-05-21 DIAGNOSIS — M7989 Other specified soft tissue disorders: Secondary | ICD-10-CM

## 2014-05-21 DIAGNOSIS — R0902 Hypoxemia: Secondary | ICD-10-CM | POA: Diagnosis not present

## 2014-05-21 DIAGNOSIS — I359 Nonrheumatic aortic valve disorder, unspecified: Secondary | ICD-10-CM | POA: Diagnosis not present

## 2014-05-21 DIAGNOSIS — R609 Edema, unspecified: Secondary | ICD-10-CM | POA: Insufficient documentation

## 2014-05-21 DIAGNOSIS — I2789 Other specified pulmonary heart diseases: Secondary | ICD-10-CM | POA: Insufficient documentation

## 2014-05-21 DIAGNOSIS — R0600 Dyspnea, unspecified: Secondary | ICD-10-CM

## 2014-05-21 DIAGNOSIS — J9620 Acute and chronic respiratory failure, unspecified whether with hypoxia or hypercapnia: Secondary | ICD-10-CM | POA: Diagnosis not present

## 2014-05-21 DIAGNOSIS — J9621 Acute and chronic respiratory failure with hypoxia: Secondary | ICD-10-CM

## 2014-05-21 DIAGNOSIS — R600 Localized edema: Secondary | ICD-10-CM

## 2014-05-21 NOTE — Progress Notes (Signed)
2D Echocardiogram Complete.  05/21/2014   Deliah Boston, RDCS   Preliminary Technician Findings:  This was a technically difficult study due to body habitus, and oxygen dependence.  Wesley [name] is on 3 Liters of oxygen at baseline, with low saturation (in the 80s, per patient).  There is also moderate aortic stenosis, and pulmonary hypertension.  I have consulted with Dr. Gwenlyn Found at the time of the exam about using endocardial border enhancement (Definity), and we both agree that with his complex history along with todays echo findings, it would be best not to put the patient at a higher risk by using the contrast agent.  Wesley [name] also expressed a degree of discomfort in using the Definity.

## 2014-05-21 NOTE — Progress Notes (Signed)
Bilateral Lower Ext. Venous Duplex Completed.. Negative for DVT and SVT. Oda Cogan, BS, RDMS, RVT

## 2014-05-22 ENCOUNTER — Ambulatory Visit (HOSPITAL_COMMUNITY)
Admission: RE | Admit: 2014-05-22 | Discharge: 2014-05-22 | Disposition: A | Discharge status: Home / Self Care | Payer: Medicare Other | Source: Ambulatory Visit | Attending: Adult Health | Admitting: Adult Health

## 2014-05-22 ENCOUNTER — Encounter: Payer: Self-pay | Admitting: Adult Health

## 2014-05-22 ENCOUNTER — Ambulatory Visit (HOSPITAL_COMMUNITY): Payer: Medicare Other

## 2014-05-22 DIAGNOSIS — I517 Cardiomegaly: Secondary | ICD-10-CM | POA: Insufficient documentation

## 2014-05-22 DIAGNOSIS — R918 Other nonspecific abnormal finding of lung field: Secondary | ICD-10-CM | POA: Insufficient documentation

## 2014-05-22 DIAGNOSIS — R0902 Hypoxemia: Secondary | ICD-10-CM

## 2014-05-22 DIAGNOSIS — M47814 Spondylosis without myelopathy or radiculopathy, thoracic region: Secondary | ICD-10-CM | POA: Diagnosis not present

## 2014-05-22 DIAGNOSIS — R0602 Shortness of breath: Secondary | ICD-10-CM | POA: Diagnosis present

## 2014-05-22 DIAGNOSIS — R0600 Dyspnea, unspecified: Secondary | ICD-10-CM

## 2014-05-22 MED ORDER — TECHNETIUM TC 99M DIETHYLENETRIAME-PENTAACETIC ACID: 40.0000 | Freq: Once | INTRAVENOUS | Status: AC | PRN

## 2014-05-22 MED ORDER — TECHNETIUM TO 99M ALBUMIN AGGREGATED
6.0000 | Freq: Once | INTRAVENOUS | Status: AC | PRN
  Administered 2014-05-22: 6 via INTRAVENOUS

## 2014-05-23 ENCOUNTER — Encounter: Payer: Self-pay | Admitting: Adult Health

## 2014-05-26 ENCOUNTER — Encounter: Payer: Self-pay | Admitting: Pulmonary Disease

## 2014-05-26 ENCOUNTER — Ambulatory Visit (INDEPENDENT_AMBULATORY_CARE_PROVIDER_SITE_OTHER): Payer: Medicare Other | Admitting: Pulmonary Disease

## 2014-05-26 ENCOUNTER — Encounter: Payer: Self-pay | Admitting: Cardiovascular Disease

## 2014-05-26 ENCOUNTER — Other Ambulatory Visit (HOSPITAL_COMMUNITY): Payer: Medicare Other

## 2014-05-26 VITALS — BP 120/70 | HR 57 | Ht 68.0 in | Wt 300.0 lb

## 2014-05-26 DIAGNOSIS — G4733 Obstructive sleep apnea (adult) (pediatric): Secondary | ICD-10-CM

## 2014-05-26 DIAGNOSIS — I359 Nonrheumatic aortic valve disorder, unspecified: Secondary | ICD-10-CM

## 2014-05-26 DIAGNOSIS — I350 Nonrheumatic aortic (valve) stenosis: Secondary | ICD-10-CM

## 2014-05-26 DIAGNOSIS — J9620 Acute and chronic respiratory failure, unspecified whether with hypoxia or hypercapnia: Secondary | ICD-10-CM

## 2014-05-26 DIAGNOSIS — E662 Morbid (severe) obesity with alveolar hypoventilation: Secondary | ICD-10-CM

## 2014-05-26 DIAGNOSIS — I2789 Other specified pulmonary heart diseases: Secondary | ICD-10-CM

## 2014-05-26 DIAGNOSIS — IMO0002 Reserved for concepts with insufficient information to code with codable children: Secondary | ICD-10-CM

## 2014-05-26 DIAGNOSIS — E678 Other specified hyperalimentation: Secondary | ICD-10-CM

## 2014-05-26 NOTE — Telephone Encounter (Signed)
This encounter was created in error - please disregard.

## 2014-05-26 NOTE — Assessment & Plan Note (Signed)
Intolerant of CPAP/BiPAP.

## 2014-05-26 NOTE — Assessment & Plan Note (Signed)
Related to OHS and aortic stenosis.

## 2014-05-26 NOTE — Assessment & Plan Note (Signed)
He did well with optifast program previously.  Advised him to call if he needed referral to re-enroll in program.

## 2014-05-26 NOTE — Assessment & Plan Note (Signed)
Advised him to f/u with Dr. Copper to assess whether this is causing significant problems with his pulmonary hypertension.

## 2014-05-26 NOTE — Telephone Encounter (Signed)
°  Patient would like to speak with nurse regarding her husbands Echo. Please call and advise.

## 2014-05-26 NOTE — Assessment & Plan Note (Signed)
Continue 3 liters oxygen 24/7.  Will arrange for new DME to supply his portable oxygen.

## 2014-05-26 NOTE — Progress Notes (Signed)
Chief Complaint  Patient presents with  . Follow-up    Pt was last seen by TP on 05/16/14 as an acute visit. Pt was started on 3lpm of O2. Pt denies SOB and CP/tightness. Pt states the only cough he has is clearing his throat.     History of Present Illness: Wesley [name] is a 68 y.o. male with OSA/OHS on 3 liters oxygen 24/7, and secondary pulmonary hypertension.  He is intolerant of CPAP/BiPAP.  He has hx of aortic stenosis.  He was seen by Rexene Edison earlier this month.  He purchased a portable pulse oximeter in August.  He found his oxygen level to be in the 70's to 80's.  This was not present when he was seen in July.  He had Echo, labs, and V/Q scan done.  He remains on 3 liters oxygen 24/7.  He has not noticed any difference in how he feels. He is not having as much leg swelling.  He denies chest pain, or cough.  CXR from 05/22/14 showed scarring at bases.  TESTS: PSG 06/03/13 >> AHI 5, SpO2 low 82%, REM 16. 101 min with SpO2 < 88%. Needed 1 liter oxygen. ONO with RA 06/05/13 >> Test time 8 hrs 31 min. Mean SpO2 84%, low SpO2 59%. Spent 8 hrs 1 min with SpO2 < 88%. ONO with 2 liters 06/27/13 >> Test time 7 hrs 25 min. Basal SpO2 91.9%, low SpO2 76%. Spent 11 min with SpO2 < 88%. Echo 05/21/14 >> EF 55 to 60%, mod LVH, mod AS, PAS 60 mmHg Doppler legs 05/21/14 >>   V/Q scan 05/22/14 >> low probability for PE  PMHx, PSHx, Medications, Allergies, Fhx, Shx reviewed.  Physical Exam:  General - No distress, wearing oxygen ENT - No sinus tenderness, MP 2, no oral exudate, no LAN Cardiac - s1s2 regular, 2/6 SM Chest - No wheeze/rales/dullness Back - No focal tenderness Abd - Soft, non-tender Ext - ankle edema Neuro - Normal strength Skin - No rashes Psych - normal mood, and behavior   Assessment/Plan:  [name], MD [facility] Pager:  [phone]

## 2014-05-26 NOTE — Patient Instructions (Signed)
Will arrange for new home care company for oxygen set up Call if you need help getting referral back to dietician Follow up in 2 months

## 2014-05-26 NOTE — Assessment & Plan Note (Signed)
Likely related to OHS and pulmonary hypertension.  Explained to him the rationale for using prescribed amount of oxygen.

## 2014-05-28 ENCOUNTER — Telehealth: Payer: Self-pay | Admitting: Adult Health

## 2014-05-28 DIAGNOSIS — E678 Other specified hyperalimentation: Secondary | ICD-10-CM

## 2014-05-28 NOTE — Telephone Encounter (Signed)
Order & records (inc 02 sats from 05/16/14) faxed to [facility]; wife aware that they will have to check on [facility] benefits & it could take 3-5 business days for them to contact the pt. Wife was given name of new DME & advised if she has not heard from them within 3-5 business to contact our office [facility]       I spoke with the pt's spouse  She states that [facility] did not accept our order  She states that she needs everything sent to [facility]  She states that this is where order for POC was supposed to go to in the first place I am putting in a new order and will forward this msg to [facility] to ensure this is being handled  Pt aware

## 2014-05-28 NOTE — Telephone Encounter (Signed)
Closed in error and will now forward to [facility]

## 2014-06-03 ENCOUNTER — Telehealth: Payer: Self-pay | Admitting: Pulmonary Disease

## 2014-06-03 NOTE — Telephone Encounter (Signed)
Message not needed. °

## 2014-07-15 ENCOUNTER — Telehealth: Payer: Self-pay | Admitting: Pulmonary Disease

## 2014-07-15 DIAGNOSIS — J9621 Acute and chronic respiratory failure with hypoxia: Secondary | ICD-10-CM

## 2014-07-15 DIAGNOSIS — E662 Morbid (severe) obesity with alveolar hypoventilation: Secondary | ICD-10-CM
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
## 2014-07-16 NOTE — Telephone Encounter (Signed)
Order placed and message sent to [facility] making aware

## 2014-07-16 NOTE — Telephone Encounter (Signed)
Pt wife returning call. 

## 2014-07-16 NOTE — Telephone Encounter (Signed)
Pt wife returning call, please call asap.[name]

## 2014-07-16 NOTE — Telephone Encounter (Signed)
Wife call back again and states she called yesterday and was told someone would call her then and she didn't receive a call to this morning and this is her 3rd time calling today to speak with someone please call her at [phone]

## 2014-07-16 NOTE — Telephone Encounter (Signed)
Okay to send these orders. 

## 2014-07-16 NOTE — Telephone Encounter (Signed)
lmomtcb x1 

## 2014-07-17 ENCOUNTER — Telehealth: Payer: Self-pay | Admitting: Pulmonary Disease

## 2014-07-17 DIAGNOSIS — G4733 Obstructive sleep apnea (adult) (pediatric): Secondary | ICD-10-CM

## 2014-07-17 NOTE — Telephone Encounter (Signed)
Called Dr Eddie Dibbles office, spoke with Jan. Made aware that order placed 07/16/14 for new neb machine, supplies, O2 concentrator and nasal cannulas. States that the pt is calling their office asking status. Advised that I would contact [facility] to check on orders.  Spoke with Peggy with [facility], orders are being processed and pt will be contacted. Called and spoke with pt wife, aware that [facility] is working on orders.  Pt to contact us if anything further needed.

## 2014-07-17 NOTE — Telephone Encounter (Signed)
Spoke with Peggy with [facility], orders are being processed and pt will be contacted.

## 2014-07-17 NOTE — Telephone Encounter (Signed)
I sent a [facility] staff message & also spoke with our [place], [place].  She is aware of this new Urgent Order & will call the office to get this taken care of Wesley [name]
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
## 2014-07-24 ENCOUNTER — Telehealth: Payer: Self-pay | Admitting: Pulmonary Disease
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
## 2014-07-24 NOTE — Telephone Encounter (Signed)
Called spoke with spouse. She reports [facility] is advising her we have not placed any orders for pt to receive new neb machine. She reports when the concentrator was delivered she was told then we had not sent in any orders. I advised on 07/16/14 we placed the orders to [facility] and it was confirmed by [facility] this was received.   I called Melissa w/ [facility] and she is checking on this and will call back ASAP

## 2014-07-28 ENCOUNTER — Ambulatory Visit: Payer: Medicare Other | Admitting: Pulmonary Disease

## 2014-08-01 ENCOUNTER — Telehealth: Payer: Self-pay | Admitting: Cardiovascular Disease

## 2014-08-01 NOTE — Telephone Encounter (Signed)
I spoke with the pt's wife and the pt was given a letter from his PCP stating that he needs to have a face to face meeting with Dr Burt Knack to discuss his Pulmonary HTN. The pt's wife said that Dr Halford Chessman also said the pt needs to see Cardiology.  In speaking with the pt's wife she is unsure of what needs to be done for the pt.  The pt has previously been seen by Dr Haroldine Laws for Pulm HTN but was told that he only see CHF pt's now.  I made her aware that this information was incorrect.  I will forward this message to Dr Burt Knack to review the pt's chart and determine if the pt needs to see Dr Haroldine Laws or Dr Burt Knack at this time.

## 2014-08-01 NOTE — Telephone Encounter (Signed)
New Message  Pt wife called both primary and pulmonary Dr. Rex Kras and Dr. Halford Chessman would like for the pt to come in for an appointment to discuss pulmonary hypertension diagnosis. Would like to see Dr. Burt Knack only.

## 2014-08-03 NOTE — Telephone Encounter (Signed)
I'm fine to see him. Know him well. thx

## 2014-08-04 ENCOUNTER — Ambulatory Visit: Payer: Medicare Other | Admitting: Pulmonary Disease

## 2014-08-04 NOTE — Telephone Encounter (Signed)
Pt scheduled to see Dr Burt Knack 08/11/14.  Pt's wife aware of appt.

## 2014-08-11 ENCOUNTER — Encounter: Payer: Self-pay | Admitting: Cardiovascular Disease

## 2014-08-11 ENCOUNTER — Ambulatory Visit (INDEPENDENT_AMBULATORY_CARE_PROVIDER_SITE_OTHER): Payer: Medicare Other | Admitting: Cardiovascular Disease

## 2014-08-11 VITALS — BP 140/70 | HR 55 | Ht 68.0 in | Wt 273.4 lb

## 2014-08-11 DIAGNOSIS — IMO0002 Reserved for concepts with insufficient information to code with codable children: Secondary | ICD-10-CM

## 2014-08-11 DIAGNOSIS — I272 Other secondary pulmonary hypertension: Secondary | ICD-10-CM

## 2014-08-11 DIAGNOSIS — I350 Nonrheumatic aortic (valve) stenosis: Secondary | ICD-10-CM

## 2014-08-11 DIAGNOSIS — I10 Essential (primary) hypertension: Secondary | ICD-10-CM

## 2014-08-11 NOTE — Patient Instructions (Signed)
Your physician wants you to follow-up in: 6 MONTHS with Dr Cooper.  You will receive a reminder letter in the mail two months in advance. If you don't receive a letter, please call our office to schedule the follow-up appointment.  Your physician has requested that you have an echocardiogram in 6 MONTHS. Echocardiography is a painless test that uses sound waves to create images of your heart. It provides your doctor with information about the size and shape of your heart and how well your heart's chambers and valves are working. This procedure takes approximately one hour. There are no restrictions for this procedure.  Your physician recommends that you continue on your current medications as directed. Please refer to the Current Medication list given to you today.   
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
## 2014-08-27 ENCOUNTER — Encounter (HOSPITAL_BASED_OUTPATIENT_CLINIC_OR_DEPARTMENT_OTHER): Payer: Self-pay

## 2014-08-27 ENCOUNTER — Emergency Department (HOSPITAL_BASED_OUTPATIENT_CLINIC_OR_DEPARTMENT_OTHER): Payer: Medicare Other

## 2014-08-27 ENCOUNTER — Emergency Department (HOSPITAL_BASED_OUTPATIENT_CLINIC_OR_DEPARTMENT_OTHER)
Admission: EM | Admit: 2014-08-27 | Discharge: 2014-08-27 | Disposition: A | Discharge status: Home / Self Care | Payer: Medicare Other | Attending: Emergency Medicine | Admitting: Emergency Medicine

## 2014-08-27 DIAGNOSIS — Z792 Long term (current) use of antibiotics: Secondary | ICD-10-CM | POA: Insufficient documentation

## 2014-08-27 DIAGNOSIS — I10 Essential (primary) hypertension: Secondary | ICD-10-CM | POA: Insufficient documentation

## 2014-08-27 DIAGNOSIS — Z79899 Other long term (current) drug therapy: Secondary | ICD-10-CM | POA: Diagnosis not present

## 2014-08-27 DIAGNOSIS — Z7982 Long term (current) use of aspirin: Secondary | ICD-10-CM | POA: Insufficient documentation

## 2014-08-27 DIAGNOSIS — M1990 Unspecified osteoarthritis, unspecified site: Secondary | ICD-10-CM | POA: Insufficient documentation

## 2014-08-27 DIAGNOSIS — Z8659 Personal history of other mental and behavioral disorders: Secondary | ICD-10-CM | POA: Insufficient documentation

## 2014-08-27 DIAGNOSIS — Z87828 Personal history of other (healed) physical injury and trauma: Secondary | ICD-10-CM | POA: Insufficient documentation

## 2014-08-27 DIAGNOSIS — R1032 Left lower quadrant pain: Secondary | ICD-10-CM | POA: Diagnosis present

## 2014-08-27 DIAGNOSIS — K5900 Constipation, unspecified: Secondary | ICD-10-CM | POA: Insufficient documentation

## 2014-08-27 DIAGNOSIS — Z87448 Personal history of other diseases of urinary system: Secondary | ICD-10-CM | POA: Insufficient documentation

## 2014-08-27 DIAGNOSIS — E039 Hypothyroidism, unspecified: Secondary | ICD-10-CM | POA: Insufficient documentation

## 2014-08-27 DIAGNOSIS — Z87820 Personal history of traumatic brain injury: Secondary | ICD-10-CM | POA: Diagnosis not present

## 2014-08-27 DIAGNOSIS — K5732 Diverticulitis of large intestine without perforation or abscess without bleeding: Secondary | ICD-10-CM | POA: Insufficient documentation

## 2014-08-27 DIAGNOSIS — R630 Anorexia: Secondary | ICD-10-CM | POA: Diagnosis not present

## 2014-08-27 LAB — CBC WITH DIFFERENTIAL/PLATELET
Basophils Absolute: 0 10*3/uL (ref 0.0–0.1)
Basophils Relative: 0 % (ref 0–1)
EOS PCT: 2 % (ref 0–5)
Eosinophils Absolute: 0.3 10*3/uL (ref 0.0–0.7)
HCT: 35.2 % — ABNORMAL LOW (ref 39.0–52.0)
HEMOGLOBIN: 11 g/dL — AB (ref 13.0–17.0)
LYMPHS ABS: 1.4 10*3/uL (ref 0.7–4.0)
LYMPHS PCT: 10 % — AB (ref 12–46)
MCH: 26.1 pg (ref 26.0–34.0)
MCHC: 31.3 g/dL (ref 30.0–36.0)
MCV: 83.6 fL (ref 78.0–100.0)
MONOS PCT: 8 % (ref 3–12)
Monocytes Absolute: 1.1 10*3/uL — ABNORMAL HIGH (ref 0.1–1.0)
NEUTROS PCT: 80 % — AB (ref 43–77)
Neutro Abs: 11.4 10*3/uL — ABNORMAL HIGH (ref 1.7–7.7)
PLATELETS: 230 10*3/uL (ref 150–400)
RBC: 4.21 MIL/uL — AB (ref 4.22–5.81)
RDW: 16.6 % — ABNORMAL HIGH (ref 11.5–15.5)
WBC: 14.1 10*3/uL — AB (ref 4.0–10.5)

## 2014-08-27 LAB — COMPREHENSIVE METABOLIC PANEL
ALK PHOS: 106 U/L (ref 39–117)
ALT: 7 U/L (ref 0–53)
AST: 13 U/L (ref 0–37)
Albumin: 3.7 g/dL (ref 3.5–5.2)
Anion gap: 15 (ref 5–15)
BUN: 47 mg/dL — ABNORMAL HIGH (ref 6–23)
CO2: 28 meq/L (ref 19–32)
Calcium: 9.4 mg/dL (ref 8.4–10.5)
Chloride: 96 mEq/L (ref 96–112)
Creatinine, Ser: 2.6 mg/dL — ABNORMAL HIGH (ref 0.50–1.35)
GFR, EST AFRICAN AMERICAN: 27 mL/min — AB (ref >90)
GFR, EST NON AFRICAN AMERICAN: 24 mL/min — AB (ref >90)
GLUCOSE: 123 mg/dL — AB (ref 70–99)
POTASSIUM: 4.4 meq/L (ref 3.7–5.3)
Sodium: 139 mEq/L (ref 137–147)
Total Bilirubin: 0.3 mg/dL (ref 0.3–1.2)
Total Protein: 8 g/dL (ref 6.0–8.3)

## 2014-08-27 LAB — URINALYSIS, ROUTINE W REFLEX MICROSCOPIC
BILIRUBIN URINE: NEGATIVE
Glucose, UA: NEGATIVE mg/dL
HGB URINE DIPSTICK: NEGATIVE
KETONES UR: NEGATIVE mg/dL
Nitrite: NEGATIVE
PROTEIN: NEGATIVE mg/dL
Specific Gravity, Urine: 1.017 (ref 1.005–1.030)
UROBILINOGEN UA: 0.2 mg/dL (ref 0.0–1.0)
pH: 5 (ref 5.0–8.0)

## 2014-08-27 LAB — URINE MICROSCOPIC-ADD ON

## 2014-08-27 LAB — LIPASE, BLOOD: Lipase: 28 U/L (ref 11–59)

## 2014-08-27 MED ORDER — CIPROFLOXACIN HCL 500 MG PO TABS
500.0000 mg | ORAL_TABLET | Freq: Once | ORAL | Status: AC
  Administered 2014-08-27: 500 mg via ORAL
  Filled 2014-08-27: qty 1

## 2014-08-27 MED ORDER — OXYCODONE-ACETAMINOPHEN 5-325 MG PO TABS
2.0000 | ORAL_TABLET | Freq: Once | ORAL | Status: AC
  Administered 2014-08-27: 2 via ORAL
  Filled 2014-08-27: qty 2

## 2014-08-27 MED ORDER — HYDROMORPHONE HCL 1 MG/ML IJ SOLN
1.0000 mg | Freq: Once | INTRAMUSCULAR | Status: AC
  Administered 2014-08-27: 1 mg via INTRAVENOUS
  Filled 2014-08-27: qty 1

## 2014-08-27 MED ORDER — CIPROFLOXACIN HCL 500 MG PO TABS: 500.0000 mg | ORAL_TABLET | Freq: Two times a day (BID) | ORAL | Status: DC

## 2014-08-27 MED ORDER — METRONIDAZOLE 500 MG PO TABS: 500.0000 mg | ORAL_TABLET | Freq: Two times a day (BID) | ORAL | Status: DC

## 2014-08-27 MED ORDER — OXYCODONE-ACETAMINOPHEN 5-325 MG PO TABS: 1.0000 | ORAL_TABLET | ORAL | Status: DC | PRN

## 2014-08-27 MED ORDER — METRONIDAZOLE 500 MG PO TABS
500.0000 mg | ORAL_TABLET | Freq: Once | ORAL | Status: AC
  Administered 2014-08-27: 500 mg via ORAL
  Filled 2014-08-27: qty 1

## 2014-08-27 NOTE — ED Notes (Signed)
C/o abd pain x 1 week--was sent by [facility]

## 2014-08-27 NOTE — Discharge Instructions (Signed)
Please follow-up with your doctor as scheduled. Return to the hospital if you have a fever, worsening pain, or unable to keep down any medications.  Diverticulitis Diverticulitis is when small pockets that have formed in your colon (large intestine) become infected or swollen. HOME CARE  Follow your doctor's instructions.  Follow a special diet if told by your doctor.  When you feel better, your doctor may tell you to change your diet. You may be told to eat a lot of fiber. Fruits and vegetables are good sources of fiber. Fiber makes it easier to poop (have bowel movements).  Take supplements or probiotics as told by your doctor.  Only take medicines as told by your doctor.  Keep all follow-up visits with your doctor. GET HELP IF:  Your pain does not get better.  You have a hard time eating food.  You are not pooping like normal. GET HELP RIGHT AWAY IF:  Your pain gets worse.  Your problems do not get better.  Your problems suddenly get worse.  You have a fever.  You keep throwing up (vomiting).  You have bloody or black, tarry poop (stool). MAKE SURE YOU:   Understand these instructions.  Will watch your condition.  Will get help right away if you are not doing well or get worse. Document Released: 02/15/2008 Document Revised: 09/03/2013 Document Reviewed: 07/24/2013 [facility] Patient Information 2015 [place], [place]. This information is not intended to replace advice given to you by your health care provider. Make sure you discuss any questions you have with your health care provider.

## 2014-08-27 NOTE — ED Provider Notes (Signed)
68 year old male with left lower quadrant pain signed out to me by Dr. Colin Rhein to follow-up on CAT scan. CAT scan reviewed and reveals diverticulitis. There is no evidence of abscess formation. I discussed the results with the patient. I also discussed patient that there is some streaky area in the left lower lobe. He states that he does not have any signs or symptoms of pneumonia but does have some scarring in this area. I have discussed return precautions with the patient. He was placed on Cipro and Flagyl. He is given 10 Percocet for pain.  Shaune Pollack, MD 08/27/14 [phone]
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
## 2014-09-26 ENCOUNTER — Encounter: Payer: Self-pay | Admitting: Pulmonary Disease

## 2014-09-26 ENCOUNTER — Ambulatory Visit (INDEPENDENT_AMBULATORY_CARE_PROVIDER_SITE_OTHER): Payer: Medicare Other | Admitting: Pulmonary Disease

## 2014-09-26 VITALS — BP 120/92 | HR 55 | Temp 98.1°F | Ht 69.0 in | Wt 273.6 lb

## 2014-09-26 DIAGNOSIS — I350 Nonrheumatic aortic (valve) stenosis: Secondary | ICD-10-CM

## 2014-09-26 DIAGNOSIS — G4733 Obstructive sleep apnea (adult) (pediatric): Secondary | ICD-10-CM

## 2014-09-26 DIAGNOSIS — E662 Morbid (severe) obesity with alveolar hypoventilation: Secondary | ICD-10-CM

## 2014-09-26 DIAGNOSIS — I272 Other secondary pulmonary hypertension: Secondary | ICD-10-CM

## 2014-09-26 DIAGNOSIS — IMO0002 Reserved for concepts with insufficient information to code with codable children: Secondary | ICD-10-CM
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
## 2014-09-26 NOTE — Patient Instructions (Signed)
Follow up in 6 months 

## 2015-03-17 ENCOUNTER — Encounter: Payer: Self-pay | Admitting: Pulmonary Disease

## 2015-03-17 ENCOUNTER — Ambulatory Visit (INDEPENDENT_AMBULATORY_CARE_PROVIDER_SITE_OTHER): Payer: Medicare Other | Admitting: Pulmonary Disease

## 2015-03-17 ENCOUNTER — Telehealth: Payer: Self-pay | Admitting: Pulmonary Disease

## 2015-03-17 VITALS — BP 120/64 | HR 63 | Ht 69.0 in | Wt 305.2 lb

## 2015-03-17 DIAGNOSIS — E662 Morbid (severe) obesity with alveolar hypoventilation: Secondary | ICD-10-CM | POA: Diagnosis not present

## 2015-03-17 DIAGNOSIS — I272 Other secondary pulmonary hypertension: Secondary | ICD-10-CM | POA: Diagnosis not present

## 2015-03-17 DIAGNOSIS — J9620 Acute and chronic respiratory failure, unspecified whether with hypoxia or hypercapnia: Secondary | ICD-10-CM

## 2015-03-17 DIAGNOSIS — IMO0002 Reserved for concepts with insufficient information to code with codable children: Secondary | ICD-10-CM

## 2015-03-17 NOTE — Telephone Encounter (Signed)
Spoke with pt wife, aware that Dr Halford Chessman is not back in office until tomorrow 03/18/15 States that she spoke with [facility] regarding the patients need/order for portable O2. They are unable to get the patient the [employer] Mini as these do not above 3 liters continuous.  Wife is requesting that we send an order for him to be evaluated for a POC that uses PULSED dosing.   Please advise Dr Halford Chessman if you are okay with the patient using pulsed dosing system for exertion vs continuous. Thanks.

## 2015-03-17 NOTE — Patient Instructions (Signed)
Will arrange for home oxygen to be at 6 liters Follow up in 2 months with Dr. Halford Chessman or Tammy Parrett

## 2015-03-17 NOTE — Progress Notes (Signed)
Chief Complaint  Patient presents with  . Follow-up    c/o wt.gain,wearing 5L at home has seen sats. in 40's with exertion,weakness,feels sleepy, increased, no wheeze, no cough,denies cp or tightness    History of Present Illness: Wesley [name] is a 69 y.o. male with OSA/OHS on 3 liters oxygen at night, and secondary pulmonary hypertension.  He is intolerant of CPAP/BiPAP.  Since his last visit he has gained about 30 lbs.  With this weight change his oxygen needs have gotten worse.  He is now needing 5 to 6 liters oxygen 24/7.  He denies cough, sputum, or chest pain.  He has leg swelling, but no worse than usual.  TESTS: PSG 06/03/13 >> AHI 5, SpO2 low 82%, REM 16. 101 min with SpO2 < 88%. Needed 1 liter oxygen. ONO with RA 06/05/13 >> Test time 8 hrs 31 min. Mean SpO2 84%, low SpO2 59%. Spent 8 hrs 1 min with SpO2 < 88%. ONO with 2 liters 06/27/13 >> Test time 7 hrs 25 min. Basal SpO2 91.9%, low SpO2 76%. Spent 11 min with SpO2 < 88%. Echo 05/21/14 >> EF 55 to 60%, mod LVH, mod AS, PAS 60 mmHg Doppler legs 05/21/14 >>   V/Q scan 05/22/14 >> low probability for PE  PMHx >> Aortic stenosis, HTN, HLD, Achalasia, PSTD, TBI, Hypothyroidism, stage IV CKD, Lt tonsil oropharyngeal cancer (squamous cell) 2011  PSHx, Medications, Allergies, Fhx, Shx reviewed.  Physical Exam: BP 120/64 mmHg  Pulse 63  Ht 5\' 9"  (1.753 m)  Wt 305 lb 3.2 oz (138.438 kg)  BMI 45.05 kg/m2  SpO2 95%  General - No distress ENT - No sinus tenderness, MP 2, no oral exudate, no LAN Cardiac - s1s2 regular, 2/6 SM Chest - No wheeze/rales/dullness Back - No focal tenderness Abd - Soft, non-tender Ext - 1+edema Neuro - Normal strength Skin - No rashes Psych - normal mood, and behavior   Assessment/Plan:  Obstructive sleep apnea. 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 Plan: - advised about importance of weight loss  Obesity hypoventilation  syndrome. Plan: - he will need to use 6 liters oxygen 24/7 for now - emphasized how his breathing problems are related to his weight  - he needs to work on getting down to below at least 275 lbs >> his oxygen needs get better below this weight  Secondary pulmonary hypertension. Related to OHS, and aortic stenosis. Plan: - continue current management - he is not a candidate for oral therapy for pulmonary hypertension   [name], MD [place]/Sleep Pager:  [phone]

## 2015-03-17 NOTE — Telephone Encounter (Signed)
Pt wife says they are needing an order for simply go portable pulse for 6 liters and still keep the 6 liiter continues for @ hm consentrater the contact person @ [facility] is angie and she can be reached @   [phone] ext [phone] and they would like to clear this up today before VS leaves.[name]

## 2015-03-18 NOTE — Telephone Encounter (Signed)
Order placed for POC O2 eval.  LMTCB x 1 for pt to make aware.

## 2015-03-18 NOTE — Telephone Encounter (Signed)
Okay to send order. 

## 2015-03-19 ENCOUNTER — Telehealth: Payer: Self-pay | Admitting: Pulmonary Disease

## 2015-03-19 NOTE — Telephone Encounter (Signed)
Wife calling back [phone]

## 2015-03-19 NOTE — Telephone Encounter (Signed)
lmomtcb x 2  

## 2015-03-19 NOTE — Telephone Encounter (Signed)
Spoke with spouse. She reports she has not heard from [facility] about them coming out to evaluate pt. I called Melissa and she is going to check on this and call us back with update

## 2015-03-19 NOTE — Telephone Encounter (Signed)
See telephone encounter 03/19/15. Melissa states that they are going to call pt's wife and pt today about POC. Pt's wife is aware of this information. Nothing further was needed.

## 2015-03-25 NOTE — Telephone Encounter (Signed)
LM for Melissa [facility] to return call.  

## 2015-03-31 NOTE — Telephone Encounter (Signed)
Spoke with [name] States that patient has been evaluated - did not qualify for the Mini SimplyGo d/t O2 levels being too high. Wesley [name] is going to check and see what type of POC and liter flow pt qualified for. Will await call back from [facility].

## 2015-04-18 ENCOUNTER — Telehealth: Payer: Self-pay | Admitting: Pulmonary Disease

## 2015-04-18 ENCOUNTER — Inpatient Hospital Stay (HOSPITAL_COMMUNITY)
Admission: EM | Admit: 2015-04-18 | Discharge: 2015-04-21 | DRG: 193 | Disposition: A | Discharge status: Home / Self Care | Payer: Medicare Other | Attending: Internal Medicine | Admitting: Internal Medicine

## 2015-04-18 ENCOUNTER — Emergency Department (HOSPITAL_COMMUNITY): Payer: Medicare Other

## 2015-04-18 ENCOUNTER — Encounter (HOSPITAL_COMMUNITY): Payer: Self-pay

## 2015-04-18 DIAGNOSIS — E662 Morbid (severe) obesity with alveolar hypoventilation: Secondary | ICD-10-CM | POA: Diagnosis present

## 2015-04-18 DIAGNOSIS — A419 Sepsis, unspecified organism: Secondary | ICD-10-CM | POA: Diagnosis present

## 2015-04-18 DIAGNOSIS — J9621 Acute and chronic respiratory failure with hypoxia: Secondary | ICD-10-CM

## 2015-04-18 DIAGNOSIS — G4733 Obstructive sleep apnea (adult) (pediatric): Secondary | ICD-10-CM

## 2015-04-18 DIAGNOSIS — M1990 Unspecified osteoarthritis, unspecified site: Secondary | ICD-10-CM | POA: Diagnosis present

## 2015-04-18 DIAGNOSIS — Z9981 Dependence on supplemental oxygen: Secondary | ICD-10-CM

## 2015-04-18 DIAGNOSIS — Z79899 Other long term (current) drug therapy: Secondary | ICD-10-CM

## 2015-04-18 DIAGNOSIS — I10 Essential (primary) hypertension: Secondary | ICD-10-CM

## 2015-04-18 DIAGNOSIS — R7989 Other specified abnormal findings of blood chemistry: Secondary | ICD-10-CM

## 2015-04-18 DIAGNOSIS — F4310 Post-traumatic stress disorder, unspecified: Secondary | ICD-10-CM | POA: Diagnosis present

## 2015-04-18 DIAGNOSIS — Z923 Personal history of irradiation: Secondary | ICD-10-CM | POA: Diagnosis not present

## 2015-04-18 DIAGNOSIS — I350 Nonrheumatic aortic (valve) stenosis: Secondary | ICD-10-CM | POA: Diagnosis present

## 2015-04-18 DIAGNOSIS — Z96642 Presence of left artificial hip joint: Secondary | ICD-10-CM | POA: Diagnosis present

## 2015-04-18 DIAGNOSIS — Z85818 Personal history of malignant neoplasm of other sites of lip, oral cavity, and pharynx: Secondary | ICD-10-CM | POA: Diagnosis not present

## 2015-04-18 DIAGNOSIS — N189 Chronic kidney disease, unspecified: Secondary | ICD-10-CM

## 2015-04-18 DIAGNOSIS — Z7982 Long term (current) use of aspirin: Secondary | ICD-10-CM

## 2015-04-18 DIAGNOSIS — D631 Anemia in chronic kidney disease: Secondary | ICD-10-CM | POA: Diagnosis present

## 2015-04-18 DIAGNOSIS — E785 Hyperlipidemia, unspecified: Secondary | ICD-10-CM | POA: Diagnosis present

## 2015-04-18 DIAGNOSIS — Z6841 Body Mass Index (BMI) 40.0 and over, adult: Secondary | ICD-10-CM | POA: Diagnosis not present

## 2015-04-18 DIAGNOSIS — N183 Chronic kidney disease, stage 3 (moderate): Secondary | ICD-10-CM | POA: Diagnosis present

## 2015-04-18 DIAGNOSIS — J189 Pneumonia, unspecified organism: Principal | ICD-10-CM | POA: Diagnosis present

## 2015-04-18 DIAGNOSIS — I129 Hypertensive chronic kidney disease with stage 1 through stage 4 chronic kidney disease, or unspecified chronic kidney disease: Secondary | ICD-10-CM | POA: Diagnosis present

## 2015-04-18 DIAGNOSIS — I248 Other forms of acute ischemic heart disease: Secondary | ICD-10-CM | POA: Diagnosis present

## 2015-04-18 DIAGNOSIS — E039 Hypothyroidism, unspecified: Secondary | ICD-10-CM | POA: Diagnosis present

## 2015-04-18 DIAGNOSIS — R778 Other specified abnormalities of plasma proteins: Secondary | ICD-10-CM

## 2015-04-18 DIAGNOSIS — R0902 Hypoxemia: Secondary | ICD-10-CM | POA: Diagnosis not present

## 2015-04-18 LAB — COMPREHENSIVE METABOLIC PANEL
ALK PHOS: 83 U/L (ref 38–126)
ALK PHOS: 78 U/L (ref 38–126)
ALT: 12 U/L — AB (ref 17–63)
ALT: 12 U/L — ABNORMAL LOW (ref 17–63)
AST: 21 U/L (ref 15–41)
AST: 21 U/L (ref 15–41)
Albumin: 3.4 g/dL — ABNORMAL LOW (ref 3.5–5.0)
Albumin: 3.3 g/dL — ABNORMAL LOW (ref 3.5–5.0)
Anion gap: 9 (ref 5–15)
Anion gap: 6 (ref 5–15)
BUN: 36 mg/dL — AB (ref 6–20)
BUN: 36 mg/dL — ABNORMAL HIGH (ref 6–20)
CHLORIDE: 96 mmol/L — AB (ref 101–111)
CO2: 33 mmol/L — AB (ref 22–32)
CO2: 34 mmol/L — AB (ref 22–32)
CREATININE: 2.3 mg/dL — AB (ref 0.61–1.24)
Calcium: 8.9 mg/dL (ref 8.9–10.3)
Calcium: 8.4 mg/dL — ABNORMAL LOW (ref 8.9–10.3)
Chloride: 96 mmol/L — ABNORMAL LOW (ref 101–111)
Creatinine, Ser: 2.26 mg/dL — ABNORMAL HIGH (ref 0.61–1.24)
GFR calc Af Amer: 32 mL/min — ABNORMAL LOW (ref >60)
GFR calc non Af Amer: 28 mL/min — ABNORMAL LOW (ref >60)
GFR, EST AFRICAN AMERICAN: 32 mL/min — AB (ref >60)
GFR, EST NON AFRICAN AMERICAN: 27 mL/min — AB (ref >60)
GLUCOSE: 156 mg/dL — AB (ref 65–99)
Glucose, Bld: 121 mg/dL — ABNORMAL HIGH (ref 65–99)
POTASSIUM: 4.5 mmol/L (ref 3.5–5.1)
Potassium: 4 mmol/L (ref 3.5–5.1)
Sodium: 138 mmol/L (ref 135–145)
Sodium: 136 mmol/L (ref 135–145)
TOTAL PROTEIN: 7.2 g/dL (ref 6.5–8.1)
Total Bilirubin: 0.6 mg/dL (ref 0.3–1.2)
Total Bilirubin: 0.5 mg/dL (ref 0.3–1.2)
Total Protein: 6.9 g/dL (ref 6.5–8.1)

## 2015-04-18 LAB — CBC WITH DIFFERENTIAL/PLATELET
BASOS ABS: 0 10*3/uL (ref 0.0–0.1)
Basophils Relative: 0 % (ref 0–1)
Eosinophils Absolute: 0.1 10*3/uL (ref 0.0–0.7)
Eosinophils Relative: 0 % (ref 0–5)
HEMATOCRIT: 30.2 % — AB (ref 39.0–52.0)
HEMOGLOBIN: 9 g/dL — AB (ref 13.0–17.0)
LYMPHS PCT: 5 % — AB (ref 12–46)
Lymphs Abs: 0.9 10*3/uL (ref 0.7–4.0)
MCH: 25 pg — ABNORMAL LOW (ref 26.0–34.0)
MCHC: 29.8 g/dL — AB (ref 30.0–36.0)
MCV: 83.9 fL (ref 78.0–100.0)
MONOS PCT: 5 % (ref 3–12)
Monocytes Absolute: 1 10*3/uL (ref 0.1–1.0)
NEUTROS ABS: 16.5 10*3/uL — AB (ref 1.7–7.7)
Neutrophils Relative %: 90 % — ABNORMAL HIGH (ref 43–77)
PLATELETS: 196 10*3/uL (ref 150–400)
RBC: 3.6 MIL/uL — AB (ref 4.22–5.81)
RDW: 16.1 % — ABNORMAL HIGH (ref 11.5–15.5)
WBC: 18.5 10*3/uL — ABNORMAL HIGH (ref 4.0–10.5)

## 2015-04-18 LAB — I-STAT TROPONIN, ED: Troponin i, poc: 0.86 ng/mL (ref 0.00–0.08)

## 2015-04-18 LAB — CBC
HEMATOCRIT: 30.9 % — AB (ref 39.0–52.0)
HEMOGLOBIN: 9.2 g/dL — AB (ref 13.0–17.0)
MCH: 24.9 pg — AB (ref 26.0–34.0)
MCHC: 29.8 g/dL — ABNORMAL LOW (ref 30.0–36.0)
MCV: 83.5 fL (ref 78.0–100.0)
Platelets: 207 10*3/uL (ref 150–400)
RBC: 3.7 MIL/uL — AB (ref 4.22–5.81)
RDW: 16.1 % — AB (ref 11.5–15.5)
WBC: 19.7 10*3/uL — ABNORMAL HIGH (ref 4.0–10.5)

## 2015-04-18 LAB — APTT: aPTT: 32 seconds (ref 24–37)

## 2015-04-18 LAB — PROCALCITONIN: Procalcitonin: 0.29 ng/mL

## 2015-04-18 LAB — PROTIME-INR
INR: 1.12 (ref 0.00–1.49)
Prothrombin Time: 14.6 seconds (ref 11.6–15.2)

## 2015-04-18 LAB — I-STAT CG4 LACTIC ACID, ED: Lactic Acid, Venous: 0.58 mmol/L (ref 0.5–2.0)

## 2015-04-18 LAB — GLUCOSE, CAPILLARY: GLUCOSE-CAPILLARY: 144 mg/dL — AB (ref 65–99)

## 2015-04-18 LAB — LACTIC ACID, PLASMA
Lactic Acid, Venous: 0.6 mmol/L (ref 0.5–2.0)
Lactic Acid, Venous: 0.7 mmol/L (ref 0.5–2.0)

## 2015-04-18 LAB — BRAIN NATRIURETIC PEPTIDE: B NATRIURETIC PEPTIDE 5: 496.3 pg/mL — AB (ref 0.0–100.0)

## 2015-04-18 MED ORDER — METOPROLOL TARTRATE 50 MG PO TABS
50.0000 mg | ORAL_TABLET | Freq: Two times a day (BID) | ORAL | Status: DC
  Administered 2015-04-18 – 2015-04-21 (×6): 50 mg via ORAL
  Filled 2015-04-18 (×6): qty 1

## 2015-04-18 MED ORDER — SODIUM CHLORIDE 0.9 % IV BOLUS (SEPSIS)
250.0000 mL | Freq: Once | INTRAVENOUS | Status: AC
  Administered 2015-04-18: 250 mL via INTRAVENOUS

## 2015-04-18 MED ORDER — QUETIAPINE FUMARATE 25 MG PO TABS
125.0000 mg | ORAL_TABLET | Freq: Every day | ORAL | Status: DC
  Administered 2015-04-18 – 2015-04-20 (×3): 125 mg via ORAL
  Filled 2015-04-18 (×3): qty 1

## 2015-04-18 MED ORDER — ONDANSETRON HCL 4 MG/2ML IJ SOLN: 4.0000 mg | Freq: Four times a day (QID) | INTRAMUSCULAR | Status: DC | PRN

## 2015-04-18 MED ORDER — GABAPENTIN 300 MG PO CAPS
300.0000 mg | ORAL_CAPSULE | Freq: Every day | ORAL | Status: DC
  Administered 2015-04-18 – 2015-04-20 (×3): 300 mg via ORAL
  Filled 2015-04-18 (×3): qty 1

## 2015-04-18 MED ORDER — SODIUM CHLORIDE 0.9 % IV SOLN: 250.0000 mL | INTRAVENOUS | Status: DC | PRN

## 2015-04-18 MED ORDER — DEXTROSE 5 % IV SOLN: 1.0000 g | Freq: Once | INTRAVENOUS | Status: DC

## 2015-04-18 MED ORDER — DEXTROSE 5 % IV SOLN: 500.0000 mg | Freq: Once | INTRAVENOUS | Status: DC

## 2015-04-18 MED ORDER — DEXTROSE 5 % IV SOLN
500.0000 mg | INTRAVENOUS | Status: DC
  Administered 2015-04-18 – 2015-04-19 (×2): 500 mg via INTRAVENOUS
  Filled 2015-04-18 (×2): qty 500

## 2015-04-18 MED ORDER — PANTOPRAZOLE SODIUM 40 MG PO TBEC
40.0000 mg | DELAYED_RELEASE_TABLET | Freq: Every day | ORAL | Status: DC
  Administered 2015-04-19 – 2015-04-21 (×3): 40 mg via ORAL
  Filled 2015-04-18 (×3): qty 1

## 2015-04-18 MED ORDER — GALANTAMINE HYDROBROMIDE ER 8 MG PO CP24: 16.0000 mg | ORAL_CAPSULE | Freq: Every day | ORAL | Status: DC

## 2015-04-18 MED ORDER — ASPIRIN 81 MG PO CHEW
81.0000 mg | CHEWABLE_TABLET | Freq: Every day | ORAL | Status: DC
  Administered 2015-04-18 – 2015-04-20 (×3): 81 mg via ORAL
  Filled 2015-04-18 (×3): qty 1

## 2015-04-18 MED ORDER — CETYLPYRIDINIUM CHLORIDE 0.05 % MT LIQD
7.0000 mL | Freq: Two times a day (BID) | OROMUCOSAL | Status: DC
  Administered 2015-04-18 – 2015-04-20 (×5): 7 mL via OROMUCOSAL

## 2015-04-18 MED ORDER — BISACODYL 5 MG PO TBEC: 5.0000 mg | DELAYED_RELEASE_TABLET | Freq: Every evening | ORAL | Status: DC | PRN

## 2015-04-18 MED ORDER — SODIUM CHLORIDE 0.9 % IJ SOLN
3.0000 mL | Freq: Two times a day (BID) | INTRAMUSCULAR | Status: DC
  Administered 2015-04-20: 3 mL via INTRAVENOUS

## 2015-04-18 MED ORDER — DEXTROSE 5 % IV SOLN
1.0000 g | Freq: Once | INTRAVENOUS | Status: AC
  Administered 2015-04-18: 1 g via INTRAVENOUS
  Filled 2015-04-18: qty 10

## 2015-04-18 MED ORDER — SERTRALINE HCL 50 MG PO TABS
150.0000 mg | ORAL_TABLET | Freq: Every day | ORAL | Status: DC
  Administered 2015-04-18 – 2015-04-20 (×3): 150 mg via ORAL
  Filled 2015-04-18 (×3): qty 1

## 2015-04-18 MED ORDER — LEVOTHYROXINE SODIUM 112 MCG PO TABS
112.0000 ug | ORAL_TABLET | Freq: Every day | ORAL | Status: DC
  Administered 2015-04-19 – 2015-04-21 (×3): 112 ug via ORAL
  Filled 2015-04-18 (×3): qty 1

## 2015-04-18 MED ORDER — ONDANSETRON HCL 4 MG PO TABS: 4.0000 mg | ORAL_TABLET | Freq: Four times a day (QID) | ORAL | Status: DC | PRN

## 2015-04-18 MED ORDER — GALANTAMINE HYDROBROMIDE 4 MG PO TABS
8.0000 mg | ORAL_TABLET | Freq: Two times a day (BID) | ORAL | Status: DC
  Administered 2015-04-18 – 2015-04-21 (×6): 8 mg via ORAL
  Filled 2015-04-18 (×7): qty 2

## 2015-04-18 MED ORDER — ACETAMINOPHEN 325 MG PO TABS: 650.0000 mg | ORAL_TABLET | Freq: Four times a day (QID) | ORAL | Status: DC | PRN

## 2015-04-18 MED ORDER — ALBUTEROL SULFATE (2.5 MG/3ML) 0.083% IN NEBU: 2.5000 mg | INHALATION_SOLUTION | Freq: Four times a day (QID) | RESPIRATORY_TRACT | Status: DC | PRN

## 2015-04-18 MED ORDER — INSULIN ASPART 100 UNIT/ML ~~LOC~~ SOLN
0.0000 [IU] | Freq: Three times a day (TID) | SUBCUTANEOUS | Status: DC
Start: 2015-04-19 — End: 2015-04-21

## 2015-04-18 MED ORDER — HYDROCODONE-ACETAMINOPHEN 5-325 MG PO TABS
1.0000 | ORAL_TABLET | ORAL | Status: DC | PRN
  Administered 2015-04-19: 1 via ORAL
  Filled 2015-04-18: qty 2

## 2015-04-18 MED ORDER — AMLODIPINE BESYLATE 10 MG PO TABS
10.0000 mg | ORAL_TABLET | Freq: Every day | ORAL | Status: DC
  Administered 2015-04-18 – 2015-04-21 (×4): 10 mg via ORAL
  Filled 2015-04-18 (×4): qty 1

## 2015-04-18 MED ORDER — TAMSULOSIN HCL 0.4 MG PO CAPS
0.4000 mg | ORAL_CAPSULE | Freq: Every day | ORAL | Status: DC
  Administered 2015-04-18 – 2015-04-21 (×4): 0.4 mg via ORAL
  Filled 2015-04-18 (×4): qty 1

## 2015-04-18 MED ORDER — ASPIRIN 325 MG PO TABS
325.0000 mg | ORAL_TABLET | Freq: Once | ORAL | Status: AC
  Administered 2015-04-18: 325 mg via ORAL
  Filled 2015-04-18: qty 1

## 2015-04-18 MED ORDER — PRAVASTATIN SODIUM 20 MG PO TABS
20.0000 mg | ORAL_TABLET | Freq: Every day | ORAL | Status: DC
  Administered 2015-04-18 – 2015-04-20 (×3): 20 mg via ORAL
  Filled 2015-04-18 (×3): qty 1

## 2015-04-18 MED ORDER — DEXTROSE 5 % IV SOLN
500.0000 mg | Freq: Once | INTRAVENOUS | Status: DC
  Filled 2015-04-18: qty 500

## 2015-04-18 MED ORDER — ACETAMINOPHEN 650 MG RE SUPP: 650.0000 mg | Freq: Four times a day (QID) | RECTAL | Status: DC | PRN

## 2015-04-18 MED ORDER — SODIUM CHLORIDE 0.9 % IJ SOLN: 3.0000 mL | INTRAMUSCULAR | Status: DC | PRN

## 2015-04-18 MED ORDER — SODIUM CHLORIDE 0.9 % IV SOLN
INTRAVENOUS | Status: AC
  Administered 2015-04-18: 23:00:00 via INTRAVENOUS

## 2015-04-18 MED ORDER — DEXTROSE 5 % IV SOLN
1.0000 g | INTRAVENOUS | Status: DC
  Administered 2015-04-19: 1 g via INTRAVENOUS
  Filled 2015-04-18 (×2): qty 10

## 2015-04-18 MED ORDER — POLYETHYLENE GLYCOL 3350 17 G PO PACK: 17.0000 g | PACK | Freq: Every day | ORAL | Status: DC | PRN

## 2015-04-18 NOTE — ED Notes (Signed)
Pt presents w/ hypoxia and lethargy starting this morning.  Denies pain.  Hx of obesity hypoventilation syndrome.  Pt's wife reports "I know his oxygen is low, when he starting acting like this.  The pulmonologist told me to bump him up to 8L [place] and to come to the ED."  O2 sat 94% on 8L .

## 2015-04-18 NOTE — ED Notes (Addendum)
This RN was able to obtain only 1 blue vial for blood cultures. Dr Darl Householder made aware

## 2015-04-18 NOTE — Telephone Encounter (Signed)
Wife Santiago Glad reports patient has lost 8# since his last appointment. She reports that he desaturated and woke up lethargic this morning. She has had to increase his oxygen to 8 L/m to keep his saturation at ~88%. He is now more alert. She denies any change in his baseline lower extremity edema. No chest discomfort. No fever, sweats, or cough change from his baseline. Patient's wife to bring him to ED if he doesn't improve soon for evaluation.

## 2015-04-18 NOTE — ED Notes (Addendum)
Notified edp abt troponin results
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
## 2015-04-18 NOTE — ED Notes (Signed)
It is noted that the Pt is continually falling asleep during triage.

## 2015-04-18 NOTE — Progress Notes (Signed)
Patient states that he had a sleep study done and that they told him that he does not need a cpap machine but does need to wear oxygen at night. RT will continue to monitor.
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
## 2015-04-19 LAB — GLUCOSE, CAPILLARY
GLUCOSE-CAPILLARY: 101 mg/dL — AB (ref 65–99)
GLUCOSE-CAPILLARY: 102 mg/dL — AB (ref 65–99)
Glucose-Capillary: 94 mg/dL (ref 65–99)
Glucose-Capillary: 153 mg/dL — ABNORMAL HIGH (ref 65–99)

## 2015-04-19 LAB — BASIC METABOLIC PANEL
ANION GAP: 6 (ref 5–15)
BUN: 37 mg/dL — ABNORMAL HIGH (ref 6–20)
CALCIUM: 8.5 mg/dL — AB (ref 8.9–10.3)
CO2: 35 mmol/L — AB (ref 22–32)
Chloride: 98 mmol/L — ABNORMAL LOW (ref 101–111)
Creatinine, Ser: 2.26 mg/dL — ABNORMAL HIGH (ref 0.61–1.24)
GFR calc non Af Amer: 28 mL/min — ABNORMAL LOW (ref >60)
GFR, EST AFRICAN AMERICAN: 32 mL/min — AB (ref >60)
GLUCOSE: 101 mg/dL — AB (ref 65–99)
Potassium: 4.2 mmol/L (ref 3.5–5.1)
Sodium: 139 mmol/L (ref 135–145)

## 2015-04-19 LAB — CBC
HEMATOCRIT: 28.6 % — AB (ref 39.0–52.0)
Hemoglobin: 8.4 g/dL — ABNORMAL LOW (ref 13.0–17.0)
MCH: 24.8 pg — ABNORMAL LOW (ref 26.0–34.0)
MCHC: 29.4 g/dL — AB (ref 30.0–36.0)
MCV: 84.4 fL (ref 78.0–100.0)
Platelets: 177 10*3/uL (ref 150–400)
RBC: 3.39 MIL/uL — AB (ref 4.22–5.81)
RDW: 16.5 % — ABNORMAL HIGH (ref 11.5–15.5)
WBC: 14.9 10*3/uL — AB (ref 4.0–10.5)

## 2015-04-19 MED ORDER — CALCIUM CARBONATE ANTACID 500 MG PO CHEW
1.0000 | CHEWABLE_TABLET | Freq: Three times a day (TID) | ORAL | Status: DC | PRN
  Administered 2015-04-19: 200 mg via ORAL
  Filled 2015-04-19: qty 1

## 2015-04-19 NOTE — Progress Notes (Signed)
[facility] PROGRESS NOTE  Assessment/Plan: Acute on chronic respiratory failure with hypoxia due to  CAP (community acquired pneumonia)/Sepsis: - His hypoxia has improved continue to titrate oxygen down. - Blood pressure stable, cultures have remained negative continue Rocephin and azithromycin. - Leukocytosis improving.    Elevated troponin - Has remained chest pain-free. - Likely due to demand ischemia.  Aortic stenosis  Anemia in chronic renal disease - FOBT pending hemoglobin continues to be stable.  CKD (chronic kidney disease) Creatinine at baseline.  Hypothyroid - Continue Synthroid.  Obesity hypoventilation syndrome/Obstructive sleep apnea - Continue BiPAP at night.  Essential hypertension Blood pressure continues to be stable.    Code Status: full Family Communication: wife  Disposition Plan: home in 1-2 days   Consultants:  none  Procedures:  CXR  Antibiotics:  Rocephin and azithro (indicate start date, and stop date if known)  HPI/Subjective: feela better can speak in full sentence.  Objective: Filed Vitals:   04/18/15 1741 04/18/15 1840 04/18/15 2113 04/19/15 0502  BP: 131/74 129/64 124/61 135/74  Pulse: 82 81 76 63  Temp:  98.3 F (36.8 C) 99 F (37.2 C) 98.8 F (37.1 C)  TempSrc:  Oral Oral Oral  Resp: 18 20 18 18   Height:  5\' 11"  (1.803 m)    Weight:  131.3 kg (289 lb 7.4 oz)  131.8 kg (290 lb 9.1 oz)  SpO2: 95% 96% 94% 95%    Intake/Output Summary (Last 24 hours) at 04/19/15 1026 Last data filed at 04/19/15 0800  Gross per 24 hour  Intake 1426.67 ml  Output    575 ml  Net 851.67 ml   Filed Weights   04/18/15 1840 04/19/15 0502  Weight: 131.3 kg (289 lb 7.4 oz) 131.8 kg (290 lb 9.1 oz)    Exam:  General: Alert, awake, oriented x3, in no acute distress.  HEENT: No bruits, no goiter.  Heart: Regular rate and rhythm. Lungs: Good air movement, crackles B/L more on the left Abdomen: Soft, nontender,  nondistended, positive bowel sounds.  Neuro: Grossly intact, nonfocal.   Data Reviewed: Basic Metabolic Panel:  Recent Labs Lab 04/18/15 1556 04/18/15 1907 04/19/15 0605  NA 138 136 139  K 4.5 4.0 4.2  CL 96* 96* 98*  CO2 33* 34* 35*  GLUCOSE 121* 156* 101*  BUN 36* 36* 37*  CREATININE 2.26* 2.30* 2.26*  CALCIUM 8.9 8.4* 8.5*   Liver Function Tests:  Recent Labs Lab 04/18/15 1556 04/18/15 1907  AST 21 21  ALT 12* 12*  ALKPHOS 83 78  BILITOT 0.6 0.5  PROT 7.2 6.9  ALBUMIN 3.4* 3.3*   No results for input(s): LIPASE, AMYLASE in the last 168 hours. No results for input(s): AMMONIA in the last 168 hours. CBC:  Recent Labs Lab 04/18/15 1556 04/18/15 1907 04/19/15 0605  WBC 19.7* 18.5* 14.9*  NEUTROABS  --  16.5*  --   HGB 9.2* 9.0* 8.4*  HCT 30.9* 30.2* 28.6*  MCV 83.5 83.9 84.4  PLT 207 196 177   Cardiac Enzymes: No results for input(s): CKTOTAL, CKMB, CKMBINDEX, TROPONINI in the last 168 hours. BNP (last 3 results)  Recent Labs  04/18/15 1554  BNP 496.3*    ProBNP (last 3 results)  Recent Labs  05/16/14 1719  PROBNP 206.0*    CBG:  Recent Labs Lab 04/18/15 2123 04/19/15 0712  GLUCAP 144* 101*    No results found for this or any previous visit (from the past 240 hour(s)).   Studies: Dg Chest  2 View  04/18/2015   CLINICAL DATA:  Pt presents w/ hypoxia and lethargy starting this morning. Denies pain. Hx of obesity hypoventilation syndrome, HTN, achalasia, sleep apnea. Oropharynx cancer.  EXAM: CHEST  2 VIEW  COMPARISON:  05/22/2014  FINDINGS: There is lung consolidation in the lung bases, left greater than right, and in the perihilar left lung and medial left upper lobe. Although some of this was present on the prior studies and therefore presumed chronic, lung opacity has increased, which is most evident in the left upper lobe.  Heart is mildly enlarged. No convincing mediastinal or hilar masses.  No apparent pleural effusion.  No  pneumothorax.  Bony thorax is demineralized but grossly intact.  IMPRESSION: 1. New areas of lung opacity, most evident in the left upper lobe, suspicious for pneumonia. There is also lung base opacity, in part chronic likely due to scarring, but more confluent than on the prior study suggesting superimposed pneumonia or other infiltrate. No convincing pulmonary edema.   Electronically Signed   By: Lajean Manes M.D.   On: 04/18/2015 16:00    Scheduled Meds: . sodium chloride   Intravenous STAT  . amLODipine  10 mg Oral Daily  . antiseptic oral rinse  7 mL Mouth Rinse BID  . aspirin  81 mg Oral QHS  . azithromycin (ZITHROMAX) 500 MG IVPB  500 mg Intravenous Q24H  . cefTRIAXone (ROCEPHIN)  IV  1 g Intravenous Q24H  . gabapentin  300 mg Oral QHS  . galantamine  8 mg Oral BID WC  . insulin aspart  0-9 Units Subcutaneous TID WC  . levothyroxine  112 mcg Oral QAC breakfast  . metoprolol  50 mg Oral BID  . pantoprazole  40 mg Oral Daily  . pravastatin  20 mg Oral QHS  . QUEtiapine  125 mg Oral QHS  . sertraline  150 mg Oral QHS  . sodium chloride  3 mL Intravenous Q12H  . sodium chloride  3 mL Intravenous Q12H  . tamsulosin  0.4 mg Oral Daily   Continuous Infusions:   Time Spent: 25 min   Charlynne Cousins  [facility] Pager [phone]. If 7PM-7AM, please contact night-coverage at [email or web address], password [facility] 04/19/2015, 10:26 AM  LOS: 1 day

## 2015-04-20 LAB — GLUCOSE, CAPILLARY
Glucose-Capillary: 87 mg/dL (ref 65–99)
Glucose-Capillary: 103 mg/dL — ABNORMAL HIGH (ref 65–99)
Glucose-Capillary: 121 mg/dL — ABNORMAL HIGH (ref 65–99)
Glucose-Capillary: 90 mg/dL (ref 65–99)
Glucose-Capillary: 126 mg/dL — ABNORMAL HIGH (ref 65–99)

## 2015-04-20 LAB — HEMOGLOBIN A1C
Hgb A1c MFr Bld: 5.6 % (ref 4.8–5.6)
MEAN PLASMA GLUCOSE: 114 mg/dL

## 2015-04-20 MED ORDER — LEVOFLOXACIN 500 MG PO TABS
500.0000 mg | ORAL_TABLET | Freq: Every day | ORAL | Status: DC
Start: 2015-04-20 — End: 2015-04-21
  Administered 2015-04-20 – 2015-04-21 (×2): 500 mg via ORAL
  Filled 2015-04-20 (×2): qty 1

## 2015-04-20 NOTE — Evaluation (Signed)
Physical Therapy Evaluation Patient Details Name: Wesley [name] MRN: [id number] DOB: 1946-04-22 Today's Date: 04/20/2015   History of Present Illness  69 y.o. male with past medical history of throat cancer status post radiation, chronic renal disease stage III, recurrent aspirations pneumonia sleep apnea and obesity hypoventilation syndrome on 4 L of oxygen at home presented with hypoxia and admitted for acute on chronic respiratory failure with hypoxia due to CAP (community acquired pneumonia)/Sepsis  Clinical Impression  Pt admitted with above diagnosis. Pt currently with functional limitations due to the deficits listed below (see PT Problem List).  Pt will benefit from skilled PT to increase their independence and safety with mobility to allow discharge to the venue listed below.   Per pt and spouse, pt close to his baseline.  Discussed liters of oxygen and saturations with pt and spouse.  Spouse states pt has pulse oximeter at home.  Will continue to follow in acute however no further f/u recommendations.     Follow Up Recommendations No PT follow up    Equipment Recommendations  None recommended by PT    Recommendations for Other Services       Precautions / Restrictions Precautions Precaution Comments: monitor sats  SpO2 at rest on 3L O2 92% SpO2 dropped to 84% on 3L during ambulation so increased to 4L O2 (RN aware)     Mobility  Bed Mobility Overal bed mobility: Needs Assistance Bed Mobility: Supine to Sit     Supine to sit: Min assist;HOB elevated     General bed mobility comments: assist to pull trunk upright  Transfers Overall transfer level: Needs assistance Equipment used: Straight cane Transfers: Sit to/from Stand Sit to Stand: Min guard         General transfer comment: min/guard for safety  Ambulation/Gait Ambulation/Gait assistance: Min guard Ambulation Distance (Feet): 50 Feet Assistive device: Straight cane Gait Pattern/deviations:  Step-through pattern;Decreased stride length;Wide base of support     General Gait Details: slow pace, required seated rest break after 25 feet, SpO2 84% on 3L so increased to 4L with SPO2 improving to 92% prior to ambulating back to room  Stairs            Wheelchair Mobility    Modified Rankin (Stroke Patients Only)       Balance Overall balance assessment:  (no hx of falls)                                           Pertinent Vitals/Pain Pain Assessment: No/denies pain    Home Living Family/patient expects to be discharged to:: Private residence Living Arrangements: Spouse/significant other   Type of Home: House       Home Layout: Able to live on main level with bedroom/bathroom Home Equipment: Cane - single point      Prior Function Level of Independence: Independent with assistive device(s)         Comments: chronic 4L O2     Hand Dominance        Extremity/Trunk Assessment               Lower Extremity Assessment: Overall WFL for tasks assessed         Communication   Communication: No difficulties  Cognition Arousal/Alertness: Awake/alert Behavior During Therapy: WFL for tasks assessed/performed Overall Cognitive Status: Within Functional Limits for tasks assessed  General Comments      Exercises        Assessment/Plan    PT Assessment Patient needs continued PT services  PT Diagnosis Difficulty walking   PT Problem List Decreased mobility;Cardiopulmonary status limiting activity;Obesity  PT Treatment Interventions Gait training;Patient/family education;Functional mobility training;Stair training   PT Goals (Current goals can be found in the Care Plan section) Acute Rehab PT Goals PT Goal Formulation: With patient Time For Goal Achievement: 04/27/15 Potential to Achieve Goals: Good    Frequency Min 3X/week   Barriers to discharge        Co-evaluation                End of Session Equipment Utilized During Treatment: Oxygen Activity Tolerance: Patient limited by fatigue Patient left: in chair;with call bell/phone within reach;with family/visitor present Nurse Communication: Mobility status         Time: 6948-5462 PT Time Calculation (min) (ACUTE ONLY): 14 min   Charges:   PT Evaluation $Initial PT Evaluation Tier I: 1 Procedure     PT G Codes:        Kaylina Cahue,KATHrine E 04/20/2015, 12:48 PM Carmelia Bake, PT, DPT 04/20/2015 Pager: [phone]

## 2015-04-20 NOTE — Care Management Important Message (Signed)
Important Message  Patient Details  Name: EMMANUEL GRUENHAGEN MRN: [id number] Date of Birth: 1946/09/01   Medicare Important Message Given:  [facility] notification given    Camillo Flaming 04/20/2015, 12:57 [place] Message  Patient Details  Name: SHAWNA KIENER MRN: [id number] Date of Birth: 31-Mar-1946   Medicare Important Message Given:  Yes-second notification given    Camillo Flaming 04/20/2015, 12:56 PM

## 2015-04-20 NOTE — Progress Notes (Signed)
[facility] PROGRESS NOTE  Assessment/Plan: Acute on chronic respiratory failure with hypoxia due to  CAP (community acquired pneumonia)/Sepsis: - His hypoxia has improved continue to titrate oxygen down. - Blood pressure stable, cultures have remained negative, he was started empirically on Rocephin and Cipro, will D escalated to Levaquin.    Elevated troponin - Has remained chest pain-free. - Likely due to demand ischemia.  Aortic stenosis  Anemia in chronic renal disease Hemoglobin stable follow-up with primary care doctor as an outpatient.  CKD (chronic kidney disease) Creatinine at baseline.  Hypothyroid - Continue Synthroid.  Obesity hypoventilation syndrome/Obstructive sleep apnea - Continue BiPAP at night.  Essential hypertension Blood pressure continues to be stable.    Code Status: full Family Communication: wife  Disposition Plan: home in morning   Consultants:  none  Procedures:  CXR  Antibiotics:  Rocephin and azithro (indicate start date, and stop date if known)  HPI/Subjective: No complains  Objective: Filed Vitals:   04/19/15 1325 04/19/15 1614 04/19/15 2110 04/20/15 0455  BP: 129/68  144/79 116/64  Pulse: 60  72 55  Temp: 97.9 F (36.6 C)  98.4 F (36.9 C) 97.5 F (36.4 C)  TempSrc: Oral  Oral Oral  Resp: 16  18 18   Height:      Weight:    131.9 kg (290 lb 12.6 oz)  SpO2: 98% 96% 96% 94%    Intake/Output Summary (Last 24 hours) at 04/20/15 0739 Last data filed at 04/20/15 0432  Gross per 24 hour  Intake   1090 ml  Output   2325 ml  Net  -1235 ml   Filed Weights   04/18/15 1840 04/19/15 0502 04/20/15 0455  Weight: 131.3 kg (289 lb 7.4 oz) 131.8 kg (290 lb 9.1 oz) 131.9 kg (290 lb 12.6 oz)    Exam:  General: Alert, awake, oriented x3, in no acute distress.  HEENT: No bruits, no goiter.  Heart: Regular rate and rhythm. Lungs: Good air movement, crackles B/L more on the left Abdomen: Soft, nontender,  nondistended, positive bowel sounds.  Neuro: Grossly intact, nonfocal.   Data Reviewed: Basic Metabolic Panel:  Recent Labs Lab 04/18/15 1556 04/18/15 1907 04/19/15 0605  NA 138 136 139  K 4.5 4.0 4.2  CL 96* 96* 98*  CO2 33* 34* 35*  GLUCOSE 121* 156* 101*  BUN 36* 36* 37*  CREATININE 2.26* 2.30* 2.26*  CALCIUM 8.9 8.4* 8.5*   Liver Function Tests:  Recent Labs Lab 04/18/15 1556 04/18/15 1907  AST 21 21  ALT 12* 12*  ALKPHOS 83 78  BILITOT 0.6 0.5  PROT 7.2 6.9  ALBUMIN 3.4* 3.3*   No results for input(s): LIPASE, AMYLASE in the last 168 hours. No results for input(s): AMMONIA in the last 168 hours. CBC:  Recent Labs Lab 04/18/15 1556 04/18/15 1907 04/19/15 0605  WBC 19.7* 18.5* 14.9*  NEUTROABS  --  16.5*  --   HGB 9.2* 9.0* 8.4*  HCT 30.9* 30.2* 28.6*  MCV 83.5 83.9 84.4  PLT 207 196 177   Cardiac Enzymes: No results for input(s): CKTOTAL, CKMB, CKMBINDEX, TROPONINI in the last 168 hours. BNP (last 3 results)  Recent Labs  04/18/15 1554  BNP 496.3*    ProBNP (last 3 results)  Recent Labs  05/16/14 1719  PROBNP 206.0*    CBG:  Recent Labs Lab 04/19/15 0712 04/19/15 1208 04/19/15 1703 04/19/15 2153 04/20/15 0505  GLUCAP 101* 94 102* 153* 87    No results found for this or  any previous visit (from the past 240 hour(s)).   Studies: Dg Chest 2 View  04/18/2015   CLINICAL DATA:  Pt presents w/ hypoxia and lethargy starting this morning. Denies pain. Hx of obesity hypoventilation syndrome, HTN, achalasia, sleep apnea. Oropharynx cancer.  EXAM: CHEST  2 VIEW  COMPARISON:  05/22/2014  FINDINGS: There is lung consolidation in the lung bases, left greater than right, and in the perihilar left lung and medial left upper lobe. Although some of this was present on the prior studies and therefore presumed chronic, lung opacity has increased, which is most evident in the left upper lobe.  Heart is mildly enlarged. No convincing mediastinal or  hilar masses.  No apparent pleural effusion.  No pneumothorax.  Bony thorax is demineralized but grossly intact.  IMPRESSION: 1. New areas of lung opacity, most evident in the left upper lobe, suspicious for pneumonia. There is also lung base opacity, in part chronic likely due to scarring, but more confluent than on the prior study suggesting superimposed pneumonia or other infiltrate. No convincing pulmonary edema.   Electronically Signed   By: Lajean Manes M.D.   On: 04/18/2015 16:00    Scheduled Meds: . amLODipine  10 mg Oral Daily  . antiseptic oral rinse  7 mL Mouth Rinse BID  . aspirin  81 mg Oral QHS  . azithromycin (ZITHROMAX) 500 MG IVPB  500 mg Intravenous Q24H  . cefTRIAXone (ROCEPHIN)  IV  1 g Intravenous Q24H  . gabapentin  300 mg Oral QHS  . galantamine  8 mg Oral BID WC  . insulin aspart  0-9 Units Subcutaneous TID WC  . levothyroxine  112 mcg Oral QAC breakfast  . metoprolol  50 mg Oral BID  . pantoprazole  40 mg Oral Daily  . pravastatin  20 mg Oral QHS  . QUEtiapine  125 mg Oral QHS  . sertraline  150 mg Oral QHS  . sodium chloride  3 mL Intravenous Q12H  . sodium chloride  3 mL Intravenous Q12H  . tamsulosin  0.4 mg Oral Daily   Continuous Infusions:   Time Spent: 25 min   Charlynne Cousins  [facility] Pager [phone]. If 7PM-7AM, please contact night-coverage at [email or web address], password [facility] 04/20/2015, 7:39 AM  LOS: 2 days

## 2015-04-20 NOTE — Care Management Note (Signed)
Case Management Note  Patient Details  Name: Wesley [name] MRN: [id number] Date of Birth: 1946/01/08  Subjective/Objective:68 y/o m admitted w/hypoxia. From home.                    Action/Plan:d/c plan home. Noted 02 sats. If home 02 needed can arrange.   Expected Discharge Date:                  Expected Discharge Plan:  Home/Self Care  In-House Referral:     Discharge planning Services  CM Consult  Post Acute Care Choice:    Choice offered to:     DME Arranged:    DME Agency:     HH Arranged:    HH Agency:     Status of Service:  In process, will continue to follow  Medicare Important Message Given:  Yes-second notification given Date Medicare IM Given:    Medicare IM give by:    Date Additional Medicare IM Given:    Additional Medicare Important Message give by:     If discussed at [place] of Stay Meetings, dates discussed:    Additional Comments:  Dessa Phi, RN 04/20/2015, 2:09 PM

## 2015-04-21 LAB — GLUCOSE, CAPILLARY: Glucose-Capillary: 117 mg/dL — ABNORMAL HIGH (ref 65–99)

## 2015-04-21 MED ORDER — LEVOFLOXACIN 500 MG PO TABS: 500.0000 mg | ORAL_TABLET | Freq: Every day | ORAL | Status: DC

## 2015-04-21 NOTE — Discharge Summary (Signed)
Physician Discharge Summary  Wesley [name] KNL:[id number] DOB: 05-12-1946 DOA: 04/18/2015  PCP: [name], MD  Admit date: 04/18/2015 Discharge date: 04/21/2015  Time spent: 35 minutes  Recommendations for Outpatient Follow-up:  1. Follow up with Pulmonary as an outpatient in 3-4 weeks  Discharge Diagnoses:  Active Problems:   Morbid obesity   Obesity hypoventilation syndrome   Obstructive sleep apnea   Essential hypertension   Hypothyroid   CKD (chronic kidney disease)   Aortic stenosis   Anemia in chronic renal disease   Acute on chronic respiratory failure with hypoxia   CAP (community acquired pneumonia)   Elevated troponin   Discharge Condition: stable  Diet recommendation: regular  Filed Weights   04/19/15 0502 04/20/15 0455 04/21/15 0545  Weight: 131.8 kg (290 lb 9.1 oz) 131.9 kg (290 lb 12.6 oz) 130.6 kg (287 lb 14.7 oz)    History of present illness:  69 y.o. male with past medical history of throat cancer status post radiation, chronic renal disease stage III, recurrent aspirations pneumonia sleep apnea and obesity hypoventilation syndrome on 4 L of oxygen at home comes in for hypoxia on the day of admission. The wife was at bedside helping with history she relates he was a little bit confused this morning so she shut her oxygen and was slow. She denies he's had any cough or shortness of breath. She checked his temperature and it was 100.0. She relates that her grandkids were sick the last few days when they were at her house. No nausea vomiting or diarrhea has decreased appetite.  Hospital Course:  Acute on chronic respiratory failure with hypoxia due to CAP (community acquired pneumonia)/Sepsis: - Started empirically on rocephin and azithromycin. - His hypoxia has improved and afebrile, change to levaquin overnight, which will cont for 4 days as an outpatient.  Elevated troponin - Has remained chest pain-free.  - Likely due to demand ischemia.  Aortic  stenosis  Anemia in chronic renal disease Hemoglobin stable follow-up with primary care doctor as an outpatient.  CKD (chronic kidney disease) Creatinine at baseline.  Hypothyroid - Continue Synthroid.  Obesity hypoventilation syndrome/Obstructive sleep apnea - Continue BiPAP at night.  Essential hypertension Blood pressure continues to be stable  Procedures:  CXR  Consultations:  none  Discharge Exam: Filed Vitals:   04/21/15 0545  BP: 128/66  Pulse: 57  Temp: 97.7 F (36.5 C)  Resp: 18    General: A&O x3 Cardiovascular: RRR Respiratory: good air movement CTA B/L  Discharge Instructions   Discharge Instructions    Diet - low sodium heart healthy    Complete by:  As directed      Increase activity slowly    Complete by:  As directed           Current Discharge Medication List    START taking these medications   Details  levofloxacin (LEVAQUIN) 500 MG tablet Take 1 tablet (500 mg total) by mouth daily. Qty: 4 tablet, Refills: 0      CONTINUE these medications which have NOT CHANGED   Details  amLODipine (NORVASC) 10 MG tablet Take 10 mg by mouth daily.      aspirin 81 MG chewable tablet Chew 81 mg by mouth at bedtime.    bisacodyl (DULCOLAX) 5 MG EC tablet Take 5 mg by mouth at bedtime as needed for mild constipation or moderate constipation.    Cholecalciferol (VITAMIN D3) 1000 UNITS CAPS Take 1 capsule by mouth at bedtime. Once a day  gabapentin (NEURONTIN) 300 MG capsule Take 300 mg by mouth at bedtime.     galantamine (RAZADYNE ER) 16 MG 24 hr capsule Take 16 mg by mouth at bedtime.     levothyroxine (SYNTHROID, LEVOTHROID) 112 MCG tablet Take 112 mcg by mouth daily before breakfast.     metoprolol (LOPRESSOR) 50 MG tablet Take 50 mg by mouth 2 (two) times daily.     omeprazole (PRILOSEC) 20 MG capsule Take 20 mg by mouth 2 (two) times daily before a meal.     pravastatin (PRAVACHOL) 20 MG tablet Take 20 mg by mouth at bedtime.     QUEtiapine (SEROQUEL) 100 MG tablet Take 125 mg by mouth at bedtime.    sertraline (ZOLOFT) 100 MG tablet Take 150 mg by mouth at bedtime.    Tamsulosin HCl (FLOMAX) 0.4 MG CAPS Take 0.4 mg by mouth daily. Once a day    albuterol (PROVENTIL) (2.5 MG/3ML) 0.083% nebulizer solution Take 2.5 mg by nebulization every 6 (six) hours as needed for wheezing or shortness of breath.        No Known Allergies    The results of significant diagnostics from this hospitalization (including imaging, microbiology, ancillary and laboratory) are listed below for reference.    Significant Diagnostic Studies: Dg Chest 2 View  04/18/2015   CLINICAL DATA:  Pt presents w/ hypoxia and lethargy starting this morning. Denies pain. Hx of obesity hypoventilation syndrome, HTN, achalasia, sleep apnea. Oropharynx cancer.  EXAM: CHEST  2 VIEW  COMPARISON:  05/22/2014  FINDINGS: There is lung consolidation in the lung bases, left greater than right, and in the perihilar left lung and medial left upper lobe. Although some of this was present on the prior studies and therefore presumed chronic, lung opacity has increased, which is most evident in the left upper lobe.  Heart is mildly enlarged. No convincing mediastinal or hilar masses.  No apparent pleural effusion.  No pneumothorax.  Bony thorax is demineralized but grossly intact.  IMPRESSION: 1. New areas of lung opacity, most evident in the left upper lobe, suspicious for pneumonia. There is also lung base opacity, in part chronic likely due to scarring, but more confluent than on the prior study suggesting superimposed pneumonia or other infiltrate. No convincing pulmonary edema.   Electronically Signed   By: Lajean Manes M.D.   On: 04/18/2015 16:00    Microbiology: Recent Results (from the past 240 hour(s))  Blood culture (routine x 2)     Status: None (Preliminary result)   Collection Time: 04/18/15  5:00 PM  Result Value Ref Range Status   Specimen Description  BLOOD LEFT FOREARM  5 ML IN AEROBIC ONLY  Final   Special Requests NONE  Final   Culture   Final    NO GROWTH 1 DAY Performed at [facility]    Report Status PENDING  Incomplete  Culture, blood (x 2)     Status: None (Preliminary result)   Collection Time: 04/18/15  7:08 PM  Result Value Ref Range Status   Specimen Description BLOOD RIGHT ARM  Final   Special Requests BOTTLES DRAWN AEROBIC AND ANAEROBIC [place]  Final   Culture   Final    NO GROWTH 1 DAY Performed at [facility]    Report Status PENDING  Incomplete     Labs: Basic Metabolic Panel:  Recent Labs Lab 04/18/15 1556 04/18/15 1907 04/19/15 0605  NA 138 136 139  K 4.5 4.0 4.2  CL 96* 96* 98*  CO2 33* 34* 35*  GLUCOSE 121* 156* 101*  BUN 36* 36* 37*  CREATININE 2.26* 2.30* 2.26*  CALCIUM 8.9 8.4* 8.5*   Liver Function Tests:  Recent Labs Lab 04/18/15 1556 04/18/15 1907  AST 21 21  ALT 12* 12*  ALKPHOS 83 78  BILITOT 0.6 0.5  PROT 7.2 6.9  ALBUMIN 3.4* 3.3*   No results for input(s): LIPASE, AMYLASE in the last 168 hours. No results for input(s): AMMONIA in the last 168 hours. CBC:  Recent Labs Lab 04/18/15 1556 04/18/15 1907 04/19/15 0605  WBC 19.7* 18.5* 14.9*  NEUTROABS  --  16.5*  --   HGB 9.2* 9.0* 8.4*  HCT 30.9* 30.2* 28.6*  MCV 83.5 83.9 84.4  PLT 207 196 177   Cardiac Enzymes: No results for input(s): CKTOTAL, CKMB, CKMBINDEX, TROPONINI in the last 168 hours. BNP: BNP (last 3 results)  Recent Labs  04/18/15 1554  BNP 496.3*    ProBNP (last 3 results)  Recent Labs  05/16/14 1719  PROBNP 206.0*    CBG:  Recent Labs Lab 04/20/15 0745 04/20/15 1219 04/20/15 1631 04/20/15 2104 04/21/15 0750  GLUCAP 103* 121* 90 126* 117*       Signed:  Charlynne Cousins  [facility] 04/21/2015, 10:57 AM

## 2015-04-24 LAB — CULTURE, BLOOD (ROUTINE X 2)
Culture: NO GROWTH
Culture: NO GROWTH

## 2015-05-12 ENCOUNTER — Ambulatory Visit: Payer: Medicare Other | Admitting: Adult Health

## 2015-05-29 ENCOUNTER — Ambulatory Visit: Payer: Medicare Other | Admitting: Adult Health

## 2015-06-08 ENCOUNTER — Ambulatory Visit (INDEPENDENT_AMBULATORY_CARE_PROVIDER_SITE_OTHER): Payer: Medicare Other | Admitting: Adult Health

## 2015-06-08 ENCOUNTER — Encounter: Payer: Self-pay | Admitting: Adult Health

## 2015-06-08 ENCOUNTER — Ambulatory Visit (INDEPENDENT_AMBULATORY_CARE_PROVIDER_SITE_OTHER)
Admission: RE | Admit: 2015-06-08 | Discharge: 2015-06-08 | Disposition: A | Discharge status: Home / Self Care | Payer: Medicare Other | Source: Ambulatory Visit | Attending: Adult Health | Admitting: Adult Health

## 2015-06-08 VITALS — BP 130/72 | HR 60 | Temp 98.0°F | Ht 70.0 in | Wt 274.0 lb

## 2015-06-08 DIAGNOSIS — J189 Pneumonia, unspecified organism: Secondary | ICD-10-CM

## 2015-06-08 DIAGNOSIS — J9611 Chronic respiratory failure with hypoxia: Secondary | ICD-10-CM | POA: Diagnosis not present

## 2015-06-08 NOTE — Assessment & Plan Note (Signed)
Great job with weight loss.  Cont with great work

## 2015-06-08 NOTE — Patient Instructions (Addendum)
Continue on Oxygen 3l/m with activity and At bedtime  .  Great job with weight loss Flu shot today .  Follow up Dr. Halford Chessman  In 2-3 months and As needed

## 2015-06-08 NOTE — Progress Notes (Signed)
Subjective:    Patient ID: Brand Males, male    DOB: 1946-08-26, 69 y.o.   MRN: [id number]  HPI 69 yo with OSA/OHS on 3l/m O2 at night with secondary pulmonary HTN.  Intolerant to CPAP/BIPAP .   PMHx >> Aortic stenosis, HTN, HLD, Achalasia, PSTD, TBI, Hypothyroidism, stage IV CKD, Lt tonsil oropharyngeal cancer (squamous cell) 2011  hx of Severe aspiration in past with esophageal surgery d/t achlasia   TESTS: PSG 06/03/13 >> AHI 5, SpO2 low 82%, REM 16. 101 min with SpO2 < 88%. Needed 1 liter oxygen. ONO with RA 06/05/13 >> Test time 8 hrs 31 min. Mean SpO2 84%, low SpO2 59%. Spent 8 hrs 1 min with SpO2 < 88%. ONO with 2 liters 06/27/13 >> Test time 7 hrs 25 min. Basal SpO2 91.9%, low SpO2 76%. Spent 11 min with SpO2 < 88%. Echo 05/21/14 >> EF 55 to 60%, mod LVH, mod AS, PAS 60 mmHg Doppler legs 05/21/14 >>  V/Q scan 05/22/14 >> low probability for PE   06/08/2015 Follow up : OSA/OHS /nocturnal Hypoxia  Returns for 2 month follow up .  Says he has been doing better.  Working hard on weight loss.  O2 needs are decreased since last ov .  On O2 at 2-3l/m at home , o2 sats 99% on arrival to office   Was in hospital in early August with CAP . Tx w/ abx.  CXR today shows bilateral scarring .  Feeling much better.  Has lost 30lbs in last 2 months . Is using the Optifast program (high in protein w/ nutrients and low cal)  No chest pain,orthopnea or increased edema.      Review of Systems Constitutional:   No  weight loss, night sweats,  Fevers, chills, fatigue, or  lassitude.  HEENT:   No headaches,  Difficulty swallowing,  Tooth/dental problems, or  Sore throat,                No sneezing, itching, ear ache, nasal congestion, post nasal drip,   CV:  No chest pain,  Orthopnea, PND,  , anasarca, dizziness, palpitations, syncope.   GI  No heartburn, indigestion, abdominal pain, nausea, vomiting, diarrhea, change in bowel habits, loss of appetite, bloody stools.   Resp: .  No  change in color of mucus.  No wheezing.  No chest wall deformity  Skin: no rash or lesions.  GU: no dysuria, change in color of urine, no urgency or frequency.  No flank pain, no hematuria   MS:  No joint pain or swelling.  No decreased range of motion.  No back pain.  Psych:  No change in mood or affect. No depression or anxiety.  No memory loss.         Objective:   Physical Exam  GEN: A/Ox3; pleasant , NAD, chronically ill appearing  vital signs reviewed   HEENT:  [place]/AT,  EACs-clear, TMs-wnl, NOSE-clear, THROAT-clear, no lesions, no postnasal drip or exudate noted.   NECK:  Supple w/ fair ROM; no JVD; normal carotid impulses w/o bruits; no thyromegaly or nodules palpated; no lymphadenopathy.  RESP Decreased BS in bases .no accessory muscle use, no dullness to percussion  CARD:  RRR, grade 1/6 SM   ,tr - 1+ peripheral edema, pulses intact, no cyanosis or clubbing.  GI:   Soft & nt; nml bowel sounds; no organomegaly or masses detected.  Musco: Warm bil, no deformities or joint swelling noted.   Neuro: alert, no focal deficits  noted.    Skin: Warm, no lesions or rashes         Assessment & Plan:

## 2015-06-08 NOTE — Assessment & Plan Note (Signed)
Recent admission , now resolved  cxr shows resolution w/ chronic changes   Plan  Continue on Oxygen 3l/m with activity and At bedtime  .  Great job with weight loss Flu shot today .  Follow up Dr. Halford Chessman  In 2-3 months and As needed

## 2015-06-08 NOTE — Assessment & Plan Note (Signed)
Decreased O2 demands Cont w/ O2 at 3l with act and At bedtime

## 2015-06-09 NOTE — Progress Notes (Signed)
Quick Note:  Called and spoke with pt's wife. She stated the results and recs were given at ov on 06/08/15. She voiced understanding and had no further questions. ______

## 2015-06-09 NOTE — Progress Notes (Signed)
Reviewed and agree with assessment/plan. 

## 2015-06-11 ENCOUNTER — Telehealth: Payer: Self-pay | Admitting: Cardiovascular Disease

## 2015-06-11 NOTE — Telephone Encounter (Signed)
Pt's wife call to make 26yr appt /Echo same day per dr cooper.   Please give her a call back with this.

## 2015-06-22 ENCOUNTER — Ambulatory Visit: Payer: Medicare Other | Admitting: Cardiovascular Disease

## 2015-06-23 NOTE — Telephone Encounter (Signed)
Reviewed chart and the pt's appointments have already been arranged. Echo on 07/02/15 and office visit 07/06/15.

## 2015-07-02 ENCOUNTER — Other Ambulatory Visit (HOSPITAL_COMMUNITY): Payer: Medicare Other

## 2015-07-06 ENCOUNTER — Ambulatory Visit: Payer: Medicare Other | Admitting: Cardiovascular Disease

## 2015-07-07 ENCOUNTER — Other Ambulatory Visit: Payer: Self-pay | Admitting: Cardiovascular Disease

## 2015-07-07 ENCOUNTER — Ambulatory Visit (HOSPITAL_COMMUNITY): Payer: Medicare Other | Attending: Cardiovascular Disease

## 2015-07-07 DIAGNOSIS — I350 Nonrheumatic aortic (valve) stenosis: Secondary | ICD-10-CM

## 2015-07-07 DIAGNOSIS — I359 Nonrheumatic aortic valve disorder, unspecified: Secondary | ICD-10-CM | POA: Diagnosis present

## 2015-07-07 DIAGNOSIS — IMO0002 Reserved for concepts with insufficient information to code with codable children: Secondary | ICD-10-CM

## 2015-07-07 DIAGNOSIS — I10 Essential (primary) hypertension: Secondary | ICD-10-CM | POA: Diagnosis not present

## 2015-07-07 DIAGNOSIS — I517 Cardiomegaly: Secondary | ICD-10-CM | POA: Insufficient documentation

## 2015-07-07 DIAGNOSIS — I272 Other secondary pulmonary hypertension: Secondary | ICD-10-CM

## 2015-07-07 DIAGNOSIS — I77810 Thoracic aortic ectasia: Secondary | ICD-10-CM | POA: Diagnosis not present

## 2015-07-07 DIAGNOSIS — I071 Rheumatic tricuspid insufficiency: Secondary | ICD-10-CM | POA: Insufficient documentation

## 2015-07-10 ENCOUNTER — Ambulatory Visit (INDEPENDENT_AMBULATORY_CARE_PROVIDER_SITE_OTHER): Payer: Medicare Other | Admitting: Cardiovascular Disease

## 2015-07-10 ENCOUNTER — Encounter: Payer: Self-pay | Admitting: Cardiovascular Disease

## 2015-07-10 VITALS — BP 142/68 | HR 53 | Ht 70.0 in | Wt 249.1 lb

## 2015-07-10 DIAGNOSIS — I350 Nonrheumatic aortic (valve) stenosis: Secondary | ICD-10-CM | POA: Diagnosis not present
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
## 2015-07-10 NOTE — Patient Instructions (Signed)
Medication Instructions:  Your physician recommends that you continue on your current medications as directed. Please refer to the Current Medication list given to you today.  Labwork: No new orders.   Testing/Procedures: Your physician has requested that you have an echocardiogram 1 YEAR. Echocardiography is a painless test that uses sound waves to create images of your heart. It provides your doctor with information about the size and shape of your heart and how well your heart's chambers and valves are working. This procedure takes approximately one hour. There are no restrictions for this procedure.  Follow-Up: Your physician wants you to follow-up in: 1 YEAR with Dr Burt Knack.  You will receive a reminder letter in the mail two months in advance. If you don't receive a letter, please call our office to schedule the follow-up appointment.   Any Other Special Instructions Will Be Listed Below (If Applicable).     If you need a refill on your cardiac medications before your next appointment, please call your pharmacy.

## 2015-08-09 ENCOUNTER — Inpatient Hospital Stay (HOSPITAL_COMMUNITY)
Admission: EM | Admit: 2015-08-09 | Discharge: 2015-08-13 | DRG: 193 | Disposition: A | Discharge status: Home / Self Care | Payer: Medicare Other | Attending: Internal Medicine | Admitting: Internal Medicine

## 2015-08-09 ENCOUNTER — Encounter (HOSPITAL_COMMUNITY): Payer: Self-pay | Admitting: *Deleted

## 2015-08-09 ENCOUNTER — Emergency Department (HOSPITAL_COMMUNITY): Payer: Medicare Other

## 2015-08-09 DIAGNOSIS — F329 Major depressive disorder, single episode, unspecified: Secondary | ICD-10-CM | POA: Diagnosis present

## 2015-08-09 DIAGNOSIS — R739 Hyperglycemia, unspecified: Secondary | ICD-10-CM | POA: Diagnosis present

## 2015-08-09 DIAGNOSIS — T380X5A Adverse effect of glucocorticoids and synthetic analogues, initial encounter: Secondary | ICD-10-CM | POA: Diagnosis present

## 2015-08-09 DIAGNOSIS — I129 Hypertensive chronic kidney disease with stage 1 through stage 4 chronic kidney disease, or unspecified chronic kidney disease: Secondary | ICD-10-CM | POA: Diagnosis present

## 2015-08-09 DIAGNOSIS — Z85818 Personal history of malignant neoplasm of other sites of lip, oral cavity, and pharynx: Secondary | ICD-10-CM | POA: Diagnosis not present

## 2015-08-09 DIAGNOSIS — Z96642 Presence of left artificial hip joint: Secondary | ICD-10-CM | POA: Diagnosis present

## 2015-08-09 DIAGNOSIS — N184 Chronic kidney disease, stage 4 (severe): Secondary | ICD-10-CM | POA: Diagnosis present

## 2015-08-09 DIAGNOSIS — J189 Pneumonia, unspecified organism: Secondary | ICD-10-CM | POA: Diagnosis not present

## 2015-08-09 DIAGNOSIS — E785 Hyperlipidemia, unspecified: Secondary | ICD-10-CM | POA: Diagnosis present

## 2015-08-09 DIAGNOSIS — I10 Essential (primary) hypertension: Secondary | ICD-10-CM

## 2015-08-09 DIAGNOSIS — J9601 Acute respiratory failure with hypoxia: Secondary | ICD-10-CM | POA: Diagnosis present

## 2015-08-09 DIAGNOSIS — I272 Other secondary pulmonary hypertension: Secondary | ICD-10-CM | POA: Diagnosis present

## 2015-08-09 DIAGNOSIS — F32A Depression, unspecified: Secondary | ICD-10-CM | POA: Diagnosis present

## 2015-08-09 DIAGNOSIS — Z923 Personal history of irradiation: Secondary | ICD-10-CM | POA: Diagnosis not present

## 2015-08-09 DIAGNOSIS — R7303 Prediabetes: Secondary | ICD-10-CM | POA: Diagnosis present

## 2015-08-09 DIAGNOSIS — Z5189 Encounter for other specified aftercare: Secondary | ICD-10-CM

## 2015-08-09 DIAGNOSIS — N400 Enlarged prostate without lower urinary tract symptoms: Secondary | ICD-10-CM | POA: Diagnosis present

## 2015-08-09 DIAGNOSIS — C109 Malignant neoplasm of oropharynx, unspecified: Secondary | ICD-10-CM

## 2015-08-09 DIAGNOSIS — Z6839 Body mass index (BMI) 39.0-39.9, adult: Secondary | ICD-10-CM | POA: Diagnosis not present

## 2015-08-09 DIAGNOSIS — Z87891 Personal history of nicotine dependence: Secondary | ICD-10-CM | POA: Diagnosis not present

## 2015-08-09 DIAGNOSIS — N189 Chronic kidney disease, unspecified: Secondary | ICD-10-CM

## 2015-08-09 DIAGNOSIS — Z9981 Dependence on supplemental oxygen: Secondary | ICD-10-CM

## 2015-08-09 DIAGNOSIS — I350 Nonrheumatic aortic (valve) stenosis: Secondary | ICD-10-CM | POA: Diagnosis present

## 2015-08-09 DIAGNOSIS — D631 Anemia in chronic kidney disease: Secondary | ICD-10-CM | POA: Diagnosis present

## 2015-08-09 DIAGNOSIS — J181 Lobar pneumonia, unspecified organism: Secondary | ICD-10-CM | POA: Diagnosis not present

## 2015-08-09 DIAGNOSIS — E662 Morbid (severe) obesity with alveolar hypoventilation: Secondary | ICD-10-CM | POA: Insufficient documentation

## 2015-08-09 DIAGNOSIS — Z8701 Personal history of pneumonia (recurrent): Secondary | ICD-10-CM

## 2015-08-09 DIAGNOSIS — E039 Hypothyroidism, unspecified: Secondary | ICD-10-CM | POA: Diagnosis present

## 2015-08-09 DIAGNOSIS — Z85819 Personal history of malignant neoplasm of unspecified site of lip, oral cavity, and pharynx: Secondary | ICD-10-CM | POA: Diagnosis present

## 2015-08-09 DIAGNOSIS — J9621 Acute and chronic respiratory failure with hypoxia: Secondary | ICD-10-CM | POA: Diagnosis present

## 2015-08-09 DIAGNOSIS — Z7982 Long term (current) use of aspirin: Secondary | ICD-10-CM

## 2015-08-09 DIAGNOSIS — E038 Other specified hypothyroidism: Secondary | ICD-10-CM | POA: Diagnosis present

## 2015-08-09 DIAGNOSIS — Z79899 Other long term (current) drug therapy: Secondary | ICD-10-CM

## 2015-08-09 DIAGNOSIS — R0602 Shortness of breath: Secondary | ICD-10-CM | POA: Diagnosis not present

## 2015-08-09 LAB — COMPREHENSIVE METABOLIC PANEL
ALBUMIN: 3.5 g/dL (ref 3.5–5.0)
ALT: 17 U/L (ref 17–63)
AST: 20 U/L (ref 15–41)
Alkaline Phosphatase: 108 U/L (ref 38–126)
Anion gap: 11 (ref 5–15)
BILIRUBIN TOTAL: 0.6 mg/dL (ref 0.3–1.2)
BUN: 32 mg/dL — AB (ref 6–20)
CHLORIDE: 97 mmol/L — AB (ref 101–111)
CO2: 30 mmol/L (ref 22–32)
Calcium: 8.7 mg/dL — ABNORMAL LOW (ref 8.9–10.3)
Creatinine, Ser: 2.39 mg/dL — ABNORMAL HIGH (ref 0.61–1.24)
GFR calc Af Amer: 30 mL/min — ABNORMAL LOW (ref >60)
GFR calc non Af Amer: 26 mL/min — ABNORMAL LOW (ref >60)
GLUCOSE: 114 mg/dL — AB (ref 65–99)
POTASSIUM: 4.1 mmol/L (ref 3.5–5.1)
Sodium: 138 mmol/L (ref 135–145)
Total Protein: 7.2 g/dL (ref 6.5–8.1)

## 2015-08-09 LAB — I-STAT CHEM 8, ED
BUN: 30 mg/dL — ABNORMAL HIGH (ref 6–20)
CALCIUM ION: 1.07 mmol/L — AB (ref 1.13–1.30)
CHLORIDE: 96 mmol/L — AB (ref 101–111)
CREATININE: 2.4 mg/dL — AB (ref 0.61–1.24)
GLUCOSE: 111 mg/dL — AB (ref 65–99)
HCT: 29 % — ABNORMAL LOW (ref 39.0–52.0)
HEMOGLOBIN: 9.9 g/dL — AB (ref 13.0–17.0)
POTASSIUM: 4 mmol/L (ref 3.5–5.1)
Sodium: 138 mmol/L (ref 135–145)
TCO2: 29 mmol/L (ref 0–100)

## 2015-08-09 LAB — I-STAT TROPONIN, ED: Troponin i, poc: 0.01 ng/mL (ref 0.00–0.08)

## 2015-08-09 LAB — CBC WITH DIFFERENTIAL/PLATELET
BASOS ABS: 0 10*3/uL (ref 0.0–0.1)
Basophils Absolute: 0 10*3/uL (ref 0.0–0.1)
Basophils Relative: 1 %
Basophils Relative: 0 %
EOS PCT: 0 %
Eosinophils Absolute: 0 10*3/uL (ref 0.0–0.7)
Eosinophils Absolute: 0 10*3/uL (ref 0.0–0.7)
Eosinophils Relative: 0 %
HCT: 28.7 % — ABNORMAL LOW (ref 39.0–52.0)
HEMATOCRIT: 29.9 % — AB (ref 39.0–52.0)
HEMOGLOBIN: 8.9 g/dL — AB (ref 13.0–17.0)
Hemoglobin: 8.5 g/dL — ABNORMAL LOW (ref 13.0–17.0)
LYMPHS ABS: 0.7 10*3/uL (ref 0.7–4.0)
LYMPHS PCT: 8 %
LYMPHS PCT: 4 %
Lymphs Abs: 0.3 10*3/uL — ABNORMAL LOW (ref 0.7–4.0)
MCH: 24.4 pg — ABNORMAL LOW (ref 26.0–34.0)
MCH: 25.1 pg — ABNORMAL LOW (ref 26.0–34.0)
MCHC: 29.6 g/dL — AB (ref 30.0–36.0)
MCHC: 29.8 g/dL — ABNORMAL LOW (ref 30.0–36.0)
MCV: 82.2 fL (ref 78.0–100.0)
MCV: 84.5 fL (ref 78.0–100.0)
MONO ABS: 0.9 10*3/uL (ref 0.1–1.0)
MONOS PCT: 10 %
Monocytes Absolute: 0.2 10*3/uL (ref 0.1–1.0)
Monocytes Relative: 3 %
NEUTROS ABS: 6.5 10*3/uL (ref 1.7–7.7)
NEUTROS PCT: 93 %
Neutro Abs: 7 10*3/uL (ref 1.7–7.7)
Neutrophils Relative %: 81 %
PLATELETS: 174 10*3/uL (ref 150–400)
Platelets: 190 10*3/uL (ref 150–400)
RBC: 3.49 MIL/uL — ABNORMAL LOW (ref 4.22–5.81)
RBC: 3.54 MIL/uL — AB (ref 4.22–5.81)
RDW: 16.6 % — AB (ref 11.5–15.5)
RDW: 16.6 % — AB (ref 11.5–15.5)
WBC: 8.6 10*3/uL (ref 4.0–10.5)
WBC: 6.9 10*3/uL (ref 4.0–10.5)

## 2015-08-09 LAB — URINE MICROSCOPIC-ADD ON
BACTERIA UA: NONE SEEN
SQUAMOUS EPITHELIAL / LPF: NONE SEEN
WBC, UA: NONE SEEN WBC/hpf (ref 0–5)

## 2015-08-09 LAB — URINALYSIS, ROUTINE W REFLEX MICROSCOPIC
BILIRUBIN URINE: NEGATIVE
GLUCOSE, UA: NEGATIVE mg/dL
KETONES UR: NEGATIVE mg/dL
Leukocytes, UA: NEGATIVE
Nitrite: NEGATIVE
PH: 7 (ref 5.0–8.0)
Protein, ur: 30 mg/dL — AB
Specific Gravity, Urine: 1.009 (ref 1.005–1.030)

## 2015-08-09 LAB — BASIC METABOLIC PANEL
Anion gap: 6 (ref 5–15)
BUN: 30 mg/dL — AB (ref 6–20)
CALCIUM: 8.4 mg/dL — AB (ref 8.9–10.3)
CHLORIDE: 98 mmol/L — AB (ref 101–111)
CO2: 33 mmol/L — ABNORMAL HIGH (ref 22–32)
CREATININE: 2.45 mg/dL — AB (ref 0.61–1.24)
GFR calc non Af Amer: 25 mL/min — ABNORMAL LOW (ref >60)
GFR, EST AFRICAN AMERICAN: 29 mL/min — AB (ref >60)
Glucose, Bld: 252 mg/dL — ABNORMAL HIGH (ref 65–99)
Potassium: 4 mmol/L (ref 3.5–5.1)
SODIUM: 137 mmol/L (ref 135–145)

## 2015-08-09 LAB — EXPECTORATED SPUTUM ASSESSMENT W GRAM STAIN, RFLX TO RESP C

## 2015-08-09 LAB — I-STAT CG4 LACTIC ACID, ED: Lactic Acid, Venous: 0.55 mmol/L (ref 0.5–2.0)

## 2015-08-09 LAB — MAGNESIUM: MAGNESIUM: 1.7 mg/dL (ref 1.7–2.4)

## 2015-08-09 LAB — BRAIN NATRIURETIC PEPTIDE: B Natriuretic Peptide: 657.8 pg/mL — ABNORMAL HIGH (ref 0.0–100.0)

## 2015-08-09 LAB — MRSA PCR SCREENING: MRSA by PCR: NEGATIVE

## 2015-08-09 LAB — TSH: TSH: 1.804 u[IU]/mL (ref 0.350–4.500)

## 2015-08-09 LAB — STREP PNEUMONIAE URINARY ANTIGEN: STREP PNEUMO URINARY ANTIGEN: NEGATIVE

## 2015-08-09 MED ORDER — GALANTAMINE HYDROBROMIDE 4 MG PO TABS
8.0000 mg | ORAL_TABLET | Freq: Two times a day (BID) | ORAL | Status: DC
  Administered 2015-08-09 – 2015-08-13 (×8): 8 mg via ORAL
  Filled 2015-08-09 (×12): qty 2

## 2015-08-09 MED ORDER — CARBOXYMETHYLCELLULOSE SODIUM 1 % OP SOLN: 1.0000 [drp] | Freq: Three times a day (TID) | OPHTHALMIC | Status: DC

## 2015-08-09 MED ORDER — IPRATROPIUM-ALBUTEROL 0.5-2.5 (3) MG/3ML IN SOLN
3.0000 mL | Freq: Four times a day (QID) | RESPIRATORY_TRACT | Status: DC
  Administered 2015-08-09 – 2015-08-11 (×10): 3 mL via RESPIRATORY_TRACT
  Filled 2015-08-09 (×10): qty 3

## 2015-08-09 MED ORDER — BISACODYL 5 MG PO TBEC: 5.0000 mg | DELAYED_RELEASE_TABLET | Freq: Every evening | ORAL | Status: DC | PRN

## 2015-08-09 MED ORDER — AMLODIPINE BESYLATE 10 MG PO TABS
10.0000 mg | ORAL_TABLET | Freq: Every morning | ORAL | Status: DC
  Administered 2015-08-09 – 2015-08-13 (×5): 10 mg via ORAL
  Filled 2015-08-09 (×5): qty 1

## 2015-08-09 MED ORDER — QUETIAPINE FUMARATE 25 MG PO TABS
125.0000 mg | ORAL_TABLET | Freq: Every day | ORAL | Status: DC
  Administered 2015-08-09 – 2015-08-12 (×4): 125 mg via ORAL
  Filled 2015-08-09 (×3): qty 1
  Filled 2015-08-09: qty 3

## 2015-08-09 MED ORDER — DEXTROSE 5 % IV SOLN
500.0000 mg | Freq: Once | INTRAVENOUS | Status: AC
  Administered 2015-08-09: 500 mg via INTRAVENOUS
  Filled 2015-08-09: qty 500

## 2015-08-09 MED ORDER — VITAMIN D 1000 UNITS PO TABS
1000.0000 [IU] | ORAL_TABLET | Freq: Every morning | ORAL | Status: DC
  Administered 2015-08-09 – 2015-08-13 (×5): 1000 [IU] via ORAL
  Filled 2015-08-09 (×5): qty 1

## 2015-08-09 MED ORDER — ASPIRIN EC 81 MG PO TBEC
81.0000 mg | DELAYED_RELEASE_TABLET | Freq: Every morning | ORAL | Status: DC
  Administered 2015-08-09 – 2015-08-13 (×5): 81 mg via ORAL
  Filled 2015-08-09 (×5): qty 1

## 2015-08-09 MED ORDER — CEFTRIAXONE SODIUM 1 G IJ SOLR
1.0000 g | Freq: Once | INTRAMUSCULAR | Status: AC
  Administered 2015-08-09: 1 g via INTRAVENOUS
  Filled 2015-08-09: qty 10

## 2015-08-09 MED ORDER — TAMSULOSIN HCL 0.4 MG PO CAPS
0.4000 mg | ORAL_CAPSULE | Freq: Every day | ORAL | Status: DC
  Administered 2015-08-09 – 2015-08-13 (×5): 0.4 mg via ORAL
  Filled 2015-08-09 (×5): qty 1

## 2015-08-09 MED ORDER — IPRATROPIUM-ALBUTEROL 0.5-2.5 (3) MG/3ML IN SOLN
3.0000 mL | RESPIRATORY_TRACT | Status: DC
  Administered 2015-08-09: 3 mL via RESPIRATORY_TRACT
  Filled 2015-08-09: qty 3

## 2015-08-09 MED ORDER — GALANTAMINE HYDROBROMIDE ER 8 MG PO CP24
16.0000 mg | ORAL_CAPSULE | Freq: Every day | ORAL | Status: DC
  Filled 2015-08-09: qty 2

## 2015-08-09 MED ORDER — SODIUM CHLORIDE 0.9 % IV SOLN: INTRAVENOUS | Status: DC

## 2015-08-09 MED ORDER — SODIUM CHLORIDE 0.9 % IV BOLUS (SEPSIS)
1000.0000 mL | Freq: Once | INTRAVENOUS | Status: AC
  Administered 2015-08-09: 1000 mL via INTRAVENOUS

## 2015-08-09 MED ORDER — DEXTROSE 5 % IV SOLN
1.0000 g | INTRAVENOUS | Status: DC
  Administered 2015-08-10 – 2015-08-13 (×4): 1 g via INTRAVENOUS
  Filled 2015-08-09 (×4): qty 10

## 2015-08-09 MED ORDER — METOPROLOL TARTRATE 50 MG PO TABS
100.0000 mg | ORAL_TABLET | Freq: Two times a day (BID) | ORAL | Status: DC
  Administered 2015-08-09 – 2015-08-10 (×3): 100 mg via ORAL
  Filled 2015-08-09: qty 2
  Filled 2015-08-09 (×3): qty 4

## 2015-08-09 MED ORDER — IPRATROPIUM-ALBUTEROL 0.5-2.5 (3) MG/3ML IN SOLN: 3.0000 mL | RESPIRATORY_TRACT | Status: DC | PRN

## 2015-08-09 MED ORDER — GABAPENTIN 300 MG PO CAPS
300.0000 mg | ORAL_CAPSULE | Freq: Every day | ORAL | Status: DC
  Administered 2015-08-09 – 2015-08-12 (×4): 300 mg via ORAL
  Filled 2015-08-09 (×4): qty 1

## 2015-08-09 MED ORDER — LEVOTHYROXINE SODIUM 25 MCG PO TABS
125.0000 ug | ORAL_TABLET | Freq: Every day | ORAL | Status: DC
  Administered 2015-08-10 – 2015-08-13 (×4): 125 ug via ORAL
  Filled 2015-08-09 (×4): qty 1

## 2015-08-09 MED ORDER — QUETIAPINE FUMARATE 100 MG PO TABS: 100.0000 mg | ORAL_TABLET | Freq: Every day | ORAL | Status: DC

## 2015-08-09 MED ORDER — POLYVINYL ALCOHOL 1.4 % OP SOLN
1.0000 [drp] | OPHTHALMIC | Status: DC | PRN
  Filled 2015-08-09: qty 15

## 2015-08-09 MED ORDER — SERTRALINE HCL 50 MG PO TABS
150.0000 mg | ORAL_TABLET | Freq: Every day | ORAL | Status: DC
  Administered 2015-08-09 – 2015-08-12 (×4): 150 mg via ORAL
  Filled 2015-08-09 (×4): qty 1

## 2015-08-09 MED ORDER — PANTOPRAZOLE SODIUM 40 MG PO TBEC
40.0000 mg | DELAYED_RELEASE_TABLET | Freq: Every day | ORAL | Status: DC
  Administered 2015-08-09 – 2015-08-13 (×5): 40 mg via ORAL
  Filled 2015-08-09 (×5): qty 1

## 2015-08-09 MED ORDER — DEXTROSE 5 % IV SOLN
500.0000 mg | INTRAVENOUS | Status: DC
  Administered 2015-08-10 – 2015-08-13 (×4): 500 mg via INTRAVENOUS
  Filled 2015-08-09 (×4): qty 500

## 2015-08-09 MED ORDER — PRAVASTATIN SODIUM 20 MG PO TABS
20.0000 mg | ORAL_TABLET | Freq: Every day | ORAL | Status: DC
  Administered 2015-08-09 – 2015-08-12 (×4): 20 mg via ORAL
  Filled 2015-08-09 (×4): qty 1

## 2015-08-09 NOTE — Progress Notes (Signed)
08/09/15  1100  Patient requesting to be tested for Hep B.

## 2015-08-09 NOTE — ED Notes (Signed)
Bed: RESB Expected date:  Expected time:  Means of arrival:  Comments: EMS 69yo M Resp Distress

## 2015-08-09 NOTE — ED Notes (Signed)
Spoke with CCU - calling back in 20 minutes for report.

## 2015-08-09 NOTE — Progress Notes (Signed)
PHARMACY NOTE -  Abx renal adj - Zithromax/Rocephin  Pharmacy has been assisting with dosing of Rocephin/Zithromax for CAP. Dosage remains stable at current doses and need for further dosage adjustment appears unlikely at present.    Will sign off at this time.  Please reconsult if a change in clinical status warrants re-evaluation of dosage.   Adrian Saran, PharmD, BCPS Pager [phone] 08/09/2015 10:57 AM

## 2015-08-09 NOTE — H&P (Addendum)
[facility] History and Physical  Wesley [name] [id number] DOB: [birth year] DOA: 08/09/2015  Referring physician: ER physician: Dr. 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 Pt was hospitalized back in 04/2015 for pneumonia and sepsis and he does have history of aspiration pneumonia in past. He presented to [facility] ED with worsening shortness of breath started the day of the admission and he has required higher oxygen conc to keep O2 sats above 90%. He reported O2 sats in 70's. He also had associated fevers at home of 59 F which has improved with tylenol. No cough, no chest pain, no palpitations. No abdominal pain, no nausea or vomiting. No leg swelling. No blood in stool or urine. No lightheadedness or loss of consciousness.  In ED, BP was stable. His blood work showed hemoglobin of 8.5, creatinine 2.39, glucose of 114. CXR showed bibasilar airspace disease concernign for multfocal pneumonia or pulmonary edema. He was started on azithromycin and rocephin and admitted to SDU due to high oxygen requirements.  Assessment & Plan    Principal Problem:   Acute respiratory failure with hypoxia (HCC) / Lobar pneumonia, unspecified organism ([place]) - Hypoxia likely due to pneumonia. CXR on admission showed bibasilar airspace disease concernign for multfocal pneumonia or pulmonary edema - Pt started on azithromycin and rocephin - Pneumonia order set placed on admission - Follow up blood cultures, resp culture, legionella, strep pneumonia - Continue duoneb every 4 hours scheduled and as needed for shortness of breath - Continue oxygen support via [place] to keep O2 sats above 90%     Active Problems:   Essential hypertension - Continue metoprolol and norvasc     CKD (chronic kidney disease) stage 4, GFR 15-29  ml/min (HCC) - Baseline creatinine 2.6 in 08/2015 - Cr on this admission 2.4, within baseline range    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 Mild worsening at the LEFT lung base.    Code Status: Full Family Communication: Plan of care discussed with the patient  Disposition Plan: Admit for further evaluation, SDU since he requires 6 L  oxygen support  Leisa Lenz, MD  [facility] Pager [phone]  Time spent in minutes: 75 minutes  Review of Systems:  Constitutional: Negative for fever, chills and malaise/fatigue. Negative for diaphoresis.  HENT: Negative for hearing loss, ear pain, nosebleeds, congestion, sore throat, neck pain, tinnitus and ear discharge.   Eyes: Negative for blurred vision, double vision, photophobia, pain, discharge and redness.  Respiratory: per HPI.   Cardiovascular: Negative for chest pain, palpitations, orthopnea, claudication and leg swelling.  Gastrointestinal: Negative for nausea, vomiting and abdominal pain. Negative for heartburn, constipation, blood in stool and melena.  Genitourinary: Negative  for dysuria, urgency, frequency, hematuria and flank pain.  Musculoskeletal: Negative for myalgias, back pain, joint pain and falls.  Skin: Negative for itching and rash.  Neurological: Negative for dizziness and weakness. Negative for tingling, tremors, sensory  change, speech change, focal weakness, loss of consciousness and headaches.  Endo/Heme/Allergies: Negative for environmental allergies and polydipsia. Does not bruise/bleed easily.  Psychiatric/Behavioral: Negative for suicidal ideas. The patient is not nervous/anxious.      History reviewed. No pertinent past medical history. Past Surgical History  Procedure Laterality Date  . Knee arthroscopy  11/08/2002    right knee examination  . Knee arthroscopy w/ partial medial meniscectomy  11/08/2002  . Chondroplasty  11/08/2002    extensive, of the patellofemoral joint  . Chondroplasty  11/08/2002    lesser extent chondroplasty medial femoral condyle   Social History:  reports that he has never smoked. He has never used smokeless tobacco. He reports that he does not drink alcohol or use illicit drugs.  No Known Allergies  Family History:  Family History  Problem Relation Age of Onset  . Adopted: Yes  . Aneurysm Mother     deceased from brain aneurysm     Prior to Admission medications   Medication Sig Start Date End Date Taking? Authorizing Provider  albuterol (PROVENTIL) (2.5 MG/3ML) 0.083% nebulizer solution Take 2.5 mg by nebulization every 6 (six) hours as needed for wheezing or shortness of breath.  07/18/14  Yes Historical Provider, MD  amLODipine (NORVASC) 10 MG tablet Take 10 mg by mouth every morning.    Yes Historical Provider, MD  aspirin EC 81 MG tablet Take 81 mg by mouth every morning.   Yes Historical Provider, MD  bisacodyl (DULCOLAX) 5 MG EC tablet Take 5 mg by mouth at bedtime as needed for mild constipation or moderate constipation.   Yes Historical Provider, MD  carboxymethylcellulose (REFRESH) 1 % ophthalmic solution Apply 1 drop to eye 3 (three) times daily.   Yes Historical Provider, MD  cholecalciferol (VITAMIN D) 1000 UNITS tablet Take 1,000 Units by mouth every morning.   Yes Historical Provider, MD  gabapentin (NEURONTIN) 300 MG capsule Take 300 mg by mouth at  bedtime.    Yes Historical Provider, MD  galantamine (RAZADYNE ER) 16 MG 24 hr capsule Take 16 mg by mouth at bedtime.    Yes Historical Provider, MD  levothyroxine (SYNTHROID, LEVOTHROID) 125 MCG tablet Take 125 mcg by mouth daily before breakfast.   Yes Historical Provider, MD  metoprolol (LOPRESSOR) 100 MG tablet Take 100 mg by mouth 2 (two) times daily.   Yes Historical Provider, MD  omeprazole (PRILOSEC) 20 MG capsule Take 20 mg by mouth 2 (two) times daily before a meal.    Yes Historical Provider, MD  pravastatin (PRAVACHOL) 20 MG tablet Take 20 mg by mouth at bedtime.   Yes Historical Provider, MD  QUEtiapine (SEROQUEL) 100 MG tablet Take 100 mg by mouth at bedtime. Take with 25 mg tablet to equal a total dose of 125 mg   Yes Historical Provider, MD  QUEtiapine (SEROQUEL) 25 MG tablet Take 25 mg by mouth at bedtime. Take with 100mg  tablet to equal a total dose of 125 mg   Yes Historical Provider, MD  sertraline (ZOLOFT) 100 MG tablet Take 150 mg by mouth at bedtime.   Yes Historical Provider, MD  Tamsulosin HCl (FLOMAX) 0.4 MG CAPS Take 0.4 mg by mouth daily after breakfast. Once a day 11/14/10  Yes Historical Provider, MD   Physical  Exam: Filed Vitals:   08/09/15 1200 08/09/15 1400 08/09/15 1600 08/09/15 1640  BP: 155/77 152/75 157/75 157/75  Pulse: 88 69 76 73  Temp:   98.1 F (36.7 C)   TempSrc:   Oral   Resp: 23 17 17 22   Height:      Weight:      SpO2: 94% 97% 95% 96%    Physical Exam  Constitutional: Appears well-developed and well-nourished. No distress.  HENT: Normocephalic. No tonsillar erythema or exudates Eyes: Conjunctivae are normal. No scleral icterus.  Neck: Normal ROM. Neck supple. No JVD. No tracheal deviation. No thyromegaly.  CVS: RRR, [id number] +, 3/6 systolic murmur at RUSB  Pulmonary: diminished breath sounds, no wheezing.  Abdominal: Soft. BS +,  no distension, tenderness, rebound or guarding.  Musculoskeletal: Normal range of motion. No edema and no  tenderness.  Lymphadenopathy: No lymphadenopathy noted, cervical, inguinal. Neuro: Alert. Normal reflexes, muscle tone coordination. No focal neurologic deficits. Skin: Skin is warm and dry. No rash noted.  Scatter areas on face and extremities punctate lesions Psychiatric: Normal mood and affect. Behavior, judgment, thought content normal.   Labs on Admission:  Basic Metabolic Panel:  Recent Labs Lab 08/09/15 0746 08/09/15 0757 08/09/15 1137  NA 138 138 137  K 4.1 4.0 4.0  CL 97* 96* 98*  CO2 30  --  33*  GLUCOSE 114* 111* 252*  BUN 32* 30* 30*  CREATININE 2.39* 2.40* 2.45*  CALCIUM 8.7*  --  8.4*  MG  --   --  1.7   Liver Function Tests:  Recent Labs Lab 08/09/15 0746  AST 20  ALT 17  ALKPHOS 108  BILITOT 0.6  PROT 7.2  ALBUMIN 3.5   No results for input(s): LIPASE, AMYLASE in the last 168 hours. No results for input(s): AMMONIA in the last 168 hours. CBC:  Recent Labs Lab 08/09/15 0746 08/09/15 0757 08/09/15 1137  WBC 8.6  --  6.9  NEUTROABS 7.0  --  6.5  HGB 8.5* 9.9* 8.9*  HCT 28.7* 29.0* 29.9*  MCV 82.2  --  84.5  PLT 174  --  190   Cardiac Enzymes: No results for input(s): CKTOTAL, CKMB, CKMBINDEX, TROPONINI in the last 168 hours. BNP: Invalid input(s): POCBNP CBG: No results for input(s): GLUCAP in the last 168 hours.  If 7PM-7AM, please contact night-coverage [email or web address] Password [email or web address] 08/09/2015, 5:25 PM

## 2015-08-09 NOTE — ED Notes (Signed)
Pt is always on home oxygen and there was a malfunction with home oxygen,  EMS was called pt was sats 79 % wheezing throughout lung fields,  Pt placed on CPAP now sats are 96 %
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
## 2015-08-09 NOTE — ED Notes (Signed)
He is in no distress and tells me he is comfortable.  He is in x-ray as I write this.

## 2015-08-10 LAB — CBC
HCT: 28.6 % — ABNORMAL LOW (ref 39.0–52.0)
HEMOGLOBIN: 8.5 g/dL — AB (ref 13.0–17.0)
MCH: 24.9 pg — AB (ref 26.0–34.0)
MCHC: 29.7 g/dL — AB (ref 30.0–36.0)
MCV: 83.6 fL (ref 78.0–100.0)
Platelets: 171 10*3/uL (ref 150–400)
RBC: 3.42 MIL/uL — ABNORMAL LOW (ref 4.22–5.81)
RDW: 16.8 % — AB (ref 11.5–15.5)
WBC: 7.4 10*3/uL (ref 4.0–10.5)

## 2015-08-10 LAB — BASIC METABOLIC PANEL
ANION GAP: 9 (ref 5–15)
BUN: 34 mg/dL — AB (ref 6–20)
CALCIUM: 8.4 mg/dL — AB (ref 8.9–10.3)
CO2: 31 mmol/L (ref 22–32)
CREATININE: 2.37 mg/dL — AB (ref 0.61–1.24)
Chloride: 100 mmol/L — ABNORMAL LOW (ref 101–111)
GFR calc Af Amer: 30 mL/min — ABNORMAL LOW (ref >60)
GFR, EST NON AFRICAN AMERICAN: 26 mL/min — AB (ref >60)
GLUCOSE: 147 mg/dL — AB (ref 65–99)
Potassium: 4 mmol/L (ref 3.5–5.1)
SODIUM: 140 mmol/L (ref 135–145)

## 2015-08-10 LAB — POCT I-STAT, CHEM 8
BUN: 30 mg/dL — AB (ref 6–20)
CHLORIDE: 96 mmol/L — AB (ref 101–111)
Calcium, Ion: 1.07 mmol/L — ABNORMAL LOW (ref 1.13–1.30)
Creatinine, Ser: 2.4 mg/dL — ABNORMAL HIGH (ref 0.61–1.24)
GLUCOSE: 111 mg/dL — AB (ref 65–99)
HCT: 29 % — ABNORMAL LOW (ref 39.0–52.0)
Hemoglobin: 9.9 g/dL — ABNORMAL LOW (ref 13.0–17.0)
POTASSIUM: 4 mmol/L (ref 3.5–5.1)
SODIUM: 138 mmol/L (ref 135–145)
TCO2: 29 mmol/L (ref 0–100)

## 2015-08-10 LAB — HIV ANTIBODY (ROUTINE TESTING W REFLEX): HIV SCREEN 4TH GENERATION: NONREACTIVE

## 2015-08-10 MED ORDER — HYDRALAZINE HCL 20 MG/ML IJ SOLN
10.0000 mg | Freq: Four times a day (QID) | INTRAMUSCULAR | Status: DC | PRN
  Administered 2015-08-10: 10 mg via INTRAVENOUS
  Filled 2015-08-10: qty 1

## 2015-08-10 NOTE — Care Management Note (Signed)
Case Management Note  Patient Details  Name: FRANCISCUS ZISK MRN: [id number] Date of Birth: Aug 16, 1946  Subjective/Objective:           resp distress and 6l/02         Action/Plan:Date: August 10, 2015 Chart reviewed for concurrent status and case management needs. Will continue to follow patient for changes and needs: Velva Harman, RN, BSN, [place]   [phone]   Expected Discharge Date:  08/11/15               Expected Discharge Plan:  Home/Self Care  In-House Referral:  NA  Discharge planning Services  CM Consult  Post Acute Care Choice:  NA Choice offered to:  NA  DME Arranged:    DME Agency:     HH Arranged:    HH Agency:     Status of Service:  In process, will continue to follow  Medicare Important Message Given:    Date Medicare IM Given:    Medicare IM give by:    Date Additional Medicare IM Given:    Additional Medicare Important Message give by:     If discussed at [place] of Stay Meetings, dates discussed:    Additional Comments:  Leeroy Cha, RN 08/10/2015, 11:09 AM

## 2015-08-10 NOTE — Progress Notes (Signed)
Patient ID: Wesley [name], male   DOB: 1946/03/09, 69 y.o.   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 Pt was hospitalized back in 04/2015 for pneumonia and sepsis and he does have history of aspiration pneumonia in past. He presented to [facility] ED with worsening shortness of breath started the day of the admission and he has required higher oxygen amount  to keep O2 sats above 90%. He reported O2 sats in 70's. Pt also had associated fevers at home of 101 F.  In ED, BP was stable. His blood work showed hemoglobin of 8.5, creatinine 2.39, glucose of 114. CXR showed bibasilar airspace disease concernign for multfocal pneumonia or pulmonary edema. He was started on azithromycin and rocephin and admitted to SDU due to high oxygen requirements.  Transfer to telemetry 08/10/2015.  Assessment/Plan:    Principal Problem:  Acute respiratory failure with hypoxia (HCC) / Lobar pneumonia, unspecified organism ([place]) - Hypoxia secondary to pneumonia - Chest x-ray on admission demonstrated bibasilar airspace disease concerning for multifocal pneumonia versus pulmonary edema - Continue azithromycin and Rocephin - Respiratory culture is pending. Blood cultures are pending, Legionella is pending - Strep pneumoniae is negative - Continue current nebulizer treatment, DuoNeb every 4 hours scheduled and every 4 hours as needed for shortness of breath or wheezing - Continue oxygen support via [place] to keep O2 sats above 90%    Active Problems:  Essential hypertension - Continue metoprolol and norvasc  - BP 109/53   CKD (chronic kidney disease) stage 4, GFR 15-29 ml/min (HCC) - Baseline creatinine 2.6 in 08/2015 - Cr on this admission 2.4 and  improving    Anemia in chronic renal disease - Due to history of malignancy - Hemoglobin stable at 8.5 - No reports of bleeding    Other specified hypothyroidism - Continue levothyroxine - TSH is within normal limits    Depression - Continue sertraline and Seroquel  - Stable   Hyperglycemia - A1c is pending  - No previous h/o DM   BPH (benign prostatic hyperplasia) - Continue Flomax   Dyslipidemia - Continue Pravachol    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  Family Communication:  plan of care discussed with the patient Disposition Plan: transfer to telemetry today. D/C by 08/12/2015.  IV access:  Peripheral IV  Procedures and diagnostic studies:    Dg Chest 2 View 08/09/2015  Bibasilar airspace disease is concerning for multifocal pneumonia versus pulmonary edema. Mild worsening at the LEFT lung base. Electronically Signed   By: Suzy Bouchard M.D.   On: 08/09/2015 07:57   Medical Consultants:  None   Other Consultants:  None   IAnti-Infectives:   Azithromycin and rocephin 08/09/2015 -->    Leisa Lenz, MD  [facility] Pager [phone]  Time spent in minutes: 25 minutes  If 7PM-7AM, please contact night-coverage [email or web address] Password [facility] 08/10/2015, 10:49 AM   LOS: 1 day    HPI/Subjective: No acute overnight events. Patient reports he feels better this am.   Objective: Filed Vitals:   08/10/15 0800 08/10/15 0821 08/10/15 0900 08/10/15 1000  BP: 179/85  147/69 109/53  Pulse: 59  63 50  Temp: 97.2  F (36.2 C)     TempSrc: Oral     Resp: 13  17 13   Height:      Weight:      SpO2: 96% 97% 95% 95%    Intake/Output Summary (Last 24 hours) at 08/10/15 1049 Last data filed at 08/10/15 0900  Gross per 24 hour  Intake  452.5 ml  Output   4100 ml  Net -3647.5 ml    Exam:   General:  Pt is alert, follows commands appropriately, not in  acute distress  Cardiovascular: Regular rate and rhythm, S1/S2 (+), SEM 3/6 in RUSB  Respiratory: diminished but no wheezing   Abdomen: Soft, non tender, non distended, bowel sounds present  Extremities: No edema, pulses DP and PT palpable bilaterally  Neuro: Grossly nonfocal  Data Reviewed: Basic Metabolic Panel:  Recent Labs Lab 08/09/15 0746 08/09/15 0757 08/09/15 1137 08/10/15 0349  NA 138 138 137 140  K 4.1 4.0 4.0 4.0  CL 97* 96* 98* 100*  CO2 30  --  33* 31  GLUCOSE 114* 111* 252* 147*  BUN 32* 30* 30* 34*  CREATININE 2.39* 2.40* 2.45* 2.37*  CALCIUM 8.7*  --  8.4* 8.4*  MG  --   --  1.7  --    Liver Function Tests:  Recent Labs Lab 08/09/15 0746  AST 20  ALT 17  ALKPHOS 108  BILITOT 0.6  PROT 7.2  ALBUMIN 3.5   No results for input(s): LIPASE, AMYLASE in the last 168 hours. No results for input(s): AMMONIA in the last 168 hours. CBC:  Recent Labs Lab 08/09/15 0746 08/09/15 0757 08/09/15 1137 08/10/15 0349  WBC 8.6  --  6.9 7.4  NEUTROABS 7.0  --  6.5  --   HGB 8.5* 9.9* 8.9* 8.5*  HCT 28.7* 29.0* 29.9* 28.6*  MCV 82.2  --  84.5 83.6  PLT 174  --  190 171   Cardiac Enzymes: No results for input(s): CKTOTAL, CKMB, CKMBINDEX, TROPONINI in the last 168 hours. BNP: Invalid input(s): POCBNP CBG: No results for input(s): GLUCAP in the last 168 hours.  Recent Results (from the past 240 hour(s))  MRSA PCR Screening     Status: None   Collection Time: 08/09/15 10:00 AM  Result Value Ref Range Status   MRSA by PCR NEGATIVE NEGATIVE Final    Comment:        The GeneXpert MRSA Assay (FDA approved for NASAL specimens only), is one component of a comprehensive MRSA colonization surveillance program. It is not intended to diagnose MRSA infection nor to guide or monitor treatment for MRSA infections.   Culture, sputum-assessment     Status: None   Collection Time: 08/09/15  6:51 PM  Result Value Ref Range Status   Specimen Description  SPUTUM  Final   Special Requests NONE  Final   Sputum evaluation   Final    THIS SPECIMEN IS ACCEPTABLE. RESPIRATORY CULTURE REPORT TO FOLLOW.   Report Status 08/09/2015 FINAL  Final  Culture, respiratory (NON-Expectorated)     Status: None (Preliminary result)   Collection Time: 08/09/15  6:51 PM  Result Value Ref Range Status   Specimen Description SPUTUM  Final   Special Requests NONE  Final   Gram Stain   Final    ABUNDANT WBC PRESENT, PREDOMINANTLY PMN FEW SQUAMOUS EPITHELIAL CELLS PRESENT FEW GRAM POSITIVE RODS RARE GRAM NEGATIVE RODS RARE GRAM POSITIVE COCCI IN PAIRS    Culture PENDING  Incomplete   Report Status PENDING  Incomplete     Scheduled Meds: . amLODipine  10 mg Oral q morning - 10a  . aspirin EC  81 mg Oral q morning - 10a  . azithromycin  500 mg Intravenous Q24H  . cefTRIAXone (ROCEPHIN)  IV  1 g Intravenous Q24H  . cholecalciferol  1,000 Units Oral q morning - 10a  . gabapentin  300 mg Oral QHS  . galantamine  8 mg Oral BID WC  . ipratropium-albuterol  3 mL Nebulization QID  . levothyroxine  125 mcg Oral QAC breakfast  . metoprolol  100 mg Oral BID  . pantoprazole  40 mg Oral Daily  . pravastatin  20 mg Oral QHS  . QUEtiapine  125 mg Oral QHS  . sertraline  150 mg Oral QHS  . tamsulosin  0.4 mg Oral QPC breakfast   Continuous Infusions: . sodium chloride 10 mL/hr at 08/09/15 1045

## 2015-08-11 LAB — GLUCOSE, CAPILLARY
GLUCOSE-CAPILLARY: 125 mg/dL — AB (ref 65–99)
GLUCOSE-CAPILLARY: 210 mg/dL — AB (ref 65–99)
Glucose-Capillary: 150 mg/dL — ABNORMAL HIGH (ref 65–99)

## 2015-08-11 LAB — HEMOGLOBIN A1C
Hgb A1c MFr Bld: 6 % — ABNORMAL HIGH (ref 4.8–5.6)
MEAN PLASMA GLUCOSE: 126 mg/dL

## 2015-08-11 LAB — LEGIONELLA PNEUMOPHILA SEROGP 1 UR AG: L. pneumophila Serogp 1 Ur Ag: NEGATIVE

## 2015-08-11 MED ORDER — INSULIN ASPART 100 UNIT/ML ~~LOC~~ SOLN
0.0000 [IU] | Freq: Three times a day (TID) | SUBCUTANEOUS | Status: DC
  Administered 2015-08-11: 3 [IU] via SUBCUTANEOUS
  Administered 2015-08-11: 1 [IU] via SUBCUTANEOUS
  Administered 2015-08-12 (×3): 2 [IU] via SUBCUTANEOUS
  Administered 2015-08-13: 1 [IU] via SUBCUTANEOUS

## 2015-08-11 MED ORDER — METHYLPREDNISOLONE SODIUM SUCC 125 MG IJ SOLR
60.0000 mg | Freq: Two times a day (BID) | INTRAMUSCULAR | Status: DC
Start: 2015-08-11 — End: 2015-08-13
  Administered 2015-08-11 – 2015-08-13 (×5): 60 mg via INTRAVENOUS
  Filled 2015-08-11 (×5): qty 2

## 2015-08-11 MED ORDER — METOPROLOL TARTRATE 25 MG PO TABS
25.0000 mg | ORAL_TABLET | Freq: Two times a day (BID) | ORAL | Status: DC
  Administered 2015-08-11 – 2015-08-12 (×3): 25 mg via ORAL
  Filled 2015-08-11 (×3): qty 1

## 2015-08-11 NOTE — Care Management Note (Signed)
Case Management Note  Patient Details  Name: CAMRYNN MACARI MRN: [id number] Date of Birth: 1946/03/23  Subjective/Objective:      Acute resp failure              Action/Plan: plan to dc home. Pt is on home O2 2-3L.   Expected Discharge Date:  08/11/15               Expected Discharge Plan:  Home/Self Care  In-House Referral:  NA  Discharge planning Services  CM Consult  Post Acute Care Choice:  NA Choice offered to:  NA  DME Arranged:    DME Agency:     HH Arranged:    HH Agency:     Status of Service:  In process, will continue to follow  Medicare Important Message Given:    Date Medicare IM Given:    Medicare IM give by:    Date Additional Medicare IM Given:    Additional Medicare Important Message give by:     If discussed at [place] of Stay Meetings, dates discussed:    Additional CommentsPurcell Mouton, RN 08/11/2015, 4:00 PM

## 2015-08-11 NOTE — Progress Notes (Signed)
Patient ID: Wesley [name], male   DOB: 03-11-1946, 69 y.o.   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 Pt was hospitalized back in 04/2015 for pneumonia and sepsis and he does have history of aspiration pneumonia in past. He presented to [facility] ED with worsening shortness of breath started the day of the admission and he has required higher oxygen amount  to keep O2 sats above 90%. He reported O2 sats in 70's. Pt also had associated fevers at home of 101 F.  In ED, BP was stable. His blood work showed hemoglobin of 8.5, creatinine 2.39, glucose of 114. CXR showed bibasilar airspace disease concernign for multfocal pneumonia or pulmonary edema. He was started on azithromycin and rocephin and admitted to SDU due to high oxygen requirements.  Transferred to telemetry 08/10/2015.  Assessment/Plan:    Principal Problem:  Acute respiratory failure with hypoxia ([place]) / Lobar pneumonia, unspecified organism ([place]) - Hypoxia likely secondary to multifocal pneumonia. - Chest x-ray demonstrated bibasilar airspace disease concerning for multifocal pneumonia versus pulmonary edema - Patient was started on azithromycin and Rocephin on this admission - Respiratory culture grew normal oropharyngeal flora - Blood cultures so far are negative - HIV is nonreactive, strep pneumonia is negative, Legionella is pending - Patient has wheezing in upper lung lobes this morning. Continue current nebulizer treatments, DuoNeb every 4 hours scheduled and every 4 hours as needed for shortness of breath or wheezing - We will add Solu-Medrol 60 mg every 12 hours - Continue oxygen support via nasal cannula to keep oxygen saturation above 90%.    Active  Problems:  Essential hypertension - Blood pressure stable, 130/68 - Continue Norvasc 10 mg daily - Continue metoprolol 25 mg twice daily. Please note that patient's home dose is 100 mg twice daily but the concern is that this high dose is causing more bronchoconstriction for which reason we cut down the dose to 25 mg twice daily. Monitor the heart rate while on reduced metoprolol dose.   CKD (chronic kidney disease) stage 4, GFR 15-29 ml/min (HCC) - Baseline creatinine 2.6 in 08/2015 - Cr on this admission 2.4 and further improving to 2.37. - Check BMP tomorrow morning.   Anemia in chronic renal disease, stage IV - Anemia secondary to combination of history of malignancy and chronic kidney disease - Hemoglobin stable at 8.5 - Check CBC tomorrow morning   Other specified hypothyroidism - Continue levothyroxine - TSH is within normal limits    Depression - Continue Sertraline and Seroquel  - Stable, not depressed. No reports of abdominal pain or diarrhea while on these medications. Sodium level within normal limits.   Hyperglycemia - A1c is 6. Hyperglycemia possibly stress induced. Now that we started Solu-Medrol will add sliding scale insulin.   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  Family Communication:  plan of care discussed with the patient Disposition Plan: D/C by 08/13/2015.  IV access:  Peripheral IV  Procedures and diagnostic studies:    Dg Chest 2 View 08/09/2015  Bibasilar airspace disease  is concerning for multifocal pneumonia versus pulmonary edema. Mild worsening at the LEFT lung base. Electronically Signed   By: Suzy Bouchard M.D.   On: 08/09/2015 07:57   Medical Consultants:  None   Other Consultants:  PT Nutrition    IAnti-Infectives:    Azithromycin and rocephin 08/09/2015 -->    Leisa Lenz, MD  [facility] Pager [phone]  Time spent in minutes: 25 minutes  If 7PM-7AM, please contact night-coverage [email or web address] Password [email or web address] 08/11/2015, 10:35 AM   LOS: 2 days    HPI/Subjective: No acute overnight events. Patient reports no respiratory distress.  Objective: Filed Vitals:   08/10/15 1937 08/10/15 2137 08/11/15 0547 08/11/15 0747  BP:  141/72 130/68   Pulse:  71 54   Temp:  98.3 F (36.8 C) 98 F (36.7 C)   TempSrc:  Oral Oral   Resp:  20 16   Height:      Weight:      SpO2: 93% 94% 97% 92%    Intake/Output Summary (Last 24 hours) at 08/11/15 1035 Last data filed at 08/11/15 0552  Gross per 24 hour  Intake    370 ml  Output   1150 ml  Net   -780 ml    Exam:   General:  Pt is alert, not in acute distress  Cardiovascular: Rate controlled, S1/S2 (+), SEM 3/6 in RUSB  Respiratory: Wheezing and upper lung lobes, no rhonchi  Abdomen: Appreciate bowel sounds, nontender abdomen  Extremities: No leg swelling, palpable pulses  Neuro: No focal deficits  Data Reviewed: Basic Metabolic Panel:  Recent Labs Lab 08/09/15 0746 08/09/15 0757 08/09/15 1137 08/10/15 0349  NA 138 138  138 137 140  K 4.1 4.0  4.0 4.0 4.0  CL 97* 96*  96* 98* 100*  CO2 30  --  33* 31  GLUCOSE 114* 111*  111* 252* 147*  BUN 32* 30*  30* 30* 34*  CREATININE 2.39* 2.40*  2.40* 2.45* 2.37*  CALCIUM 8.7*  --  8.4* 8.4*  MG  --   --  1.7  --    Liver Function Tests:  Recent Labs Lab 08/09/15 0746  AST 20  ALT 17  ALKPHOS 108  BILITOT 0.6  PROT 7.2  ALBUMIN 3.5   No results for input(s): LIPASE, AMYLASE in the last 168 hours. No results for input(s): AMMONIA in the last 168 hours. CBC:  Recent Labs Lab 08/09/15 0746 08/09/15 0757 08/09/15 1137 08/10/15 0349  WBC 8.6  --  6.9 7.4  NEUTROABS 7.0  --  6.5  --   HGB 8.5* 9.9*  9.9* 8.9* 8.5*  HCT 28.7* 29.0*  29.0* 29.9* 28.6*   MCV 82.2  --  84.5 83.6  PLT 174  --  190 171   Cardiac Enzymes: No results for input(s): CKTOTAL, CKMB, CKMBINDEX, TROPONINI in the last 168 hours. BNP: Invalid input(s): POCBNP CBG: No results for input(s): GLUCAP in the last 168 hours.  Recent Results (from the past 240 hour(s))  Culture, blood (routine x 2)     Status: None (Preliminary result)   Collection Time: 08/09/15  8:20 AM  Result Value Ref Range Status   Specimen Description LEFT ANTECUBITAL  Final   Special Requests BOTTLES DRAWN AEROBIC AND ANAEROBIC 5CC  Final   Culture   Final    NO GROWTH < 24 HOURS Performed at [facility]    Report Status PENDING  Incomplete  Culture, blood (routine x 2)     Status:  None (Preliminary result)   Collection Time: 08/09/15  8:25 AM  Result Value Ref Range Status   Specimen Description BLOOD BLOOD LEFT WRIST  Final   Special Requests BOTTLES DRAWN AEROBIC AND ANAEROBIC 5CC  Final   Culture   Final    NO GROWTH < 24 HOURS Performed at [facility]    Report Status PENDING  Incomplete  MRSA PCR Screening     Status: None   Collection Time: 08/09/15 10:00 AM  Result Value Ref Range Status   MRSA by PCR NEGATIVE NEGATIVE Final    Comment:        The GeneXpert MRSA Assay (FDA approved for NASAL specimens only), is one component of a comprehensive MRSA colonization surveillance program. It is not intended to diagnose MRSA infection nor to guide or monitor treatment for MRSA infections.   Culture, sputum-assessment     Status: None   Collection Time: 08/09/15  6:51 PM  Result Value Ref Range Status   Specimen Description SPUTUM  Final   Special Requests NONE  Final   Sputum evaluation   Final    THIS SPECIMEN IS ACCEPTABLE. RESPIRATORY CULTURE REPORT TO FOLLOW.   Report Status 08/09/2015 FINAL  Final  Culture, respiratory (NON-Expectorated)     Status: None (Preliminary result)   Collection Time: 08/09/15  6:51 PM  Result Value Ref Range Status    Specimen Description SPUTUM  Final   Special Requests NONE  Final   Gram Stain   Final    ABUNDANT WBC PRESENT, PREDOMINANTLY PMN FEW SQUAMOUS EPITHELIAL CELLS PRESENT FEW GRAM POSITIVE RODS RARE GRAM NEGATIVE RODS RARE GRAM POSITIVE COCCI IN PAIRS    Culture   Final    NORMAL OROPHARYNGEAL FLORA Performed at [facility]    Report Status PENDING  Incomplete     Scheduled Meds: . amLODipine  10 mg Oral q morning - 10a  . aspirin EC  81 mg Oral q morning - 10a  . azithromycin  500 mg Intravenous Q24H  . cefTRIAXone (ROCEPHIN)  IV  1 g Intravenous Q24H  . cholecalciferol  1,000 Units Oral q morning - 10a  . gabapentin  300 mg Oral QHS  . galantamine  8 mg Oral BID WC  . ipratropium-albuterol  3 mL Nebulization QID  . levothyroxine  125 mcg Oral QAC breakfast  . methylPREDNISolone (SOLU-MEDROL) injection  60 mg Intravenous Q12H  . metoprolol  25 mg Oral BID  . pantoprazole  40 mg Oral Daily  . pravastatin  20 mg Oral QHS  . QUEtiapine  125 mg Oral QHS  . sertraline  150 mg Oral QHS  . tamsulosin  0.4 mg Oral QPC breakfast   Continuous Infusions: . sodium chloride 10 mL/hr at 08/09/15 1045

## 2015-08-12 DIAGNOSIS — J181 Lobar pneumonia, unspecified organism: Principal | ICD-10-CM

## 2015-08-12 DIAGNOSIS — N189 Chronic kidney disease, unspecified: Secondary | ICD-10-CM

## 2015-08-12 DIAGNOSIS — D631 Anemia in chronic kidney disease: Secondary | ICD-10-CM

## 2015-08-12 DIAGNOSIS — N184 Chronic kidney disease, stage 4 (severe): Secondary | ICD-10-CM

## 2015-08-12 DIAGNOSIS — J9601 Acute respiratory failure with hypoxia: Secondary | ICD-10-CM

## 2015-08-12 LAB — CULTURE, RESPIRATORY W GRAM STAIN: Culture: NORMAL

## 2015-08-12 LAB — GLUCOSE, CAPILLARY
GLUCOSE-CAPILLARY: 156 mg/dL — AB (ref 65–99)
GLUCOSE-CAPILLARY: 154 mg/dL — AB (ref 65–99)
Glucose-Capillary: 200 mg/dL — ABNORMAL HIGH (ref 65–99)
Glucose-Capillary: 133 mg/dL — ABNORMAL HIGH (ref 65–99)

## 2015-08-12 LAB — CULTURE, RESPIRATORY

## 2015-08-12 MED ORDER — IPRATROPIUM-ALBUTEROL 0.5-2.5 (3) MG/3ML IN SOLN
3.0000 mL | Freq: Three times a day (TID) | RESPIRATORY_TRACT | Status: DC
  Administered 2015-08-12 – 2015-08-13 (×4): 3 mL via RESPIRATORY_TRACT
  Filled 2015-08-12 (×4): qty 3

## 2015-08-12 MED ORDER — BISOPROLOL FUMARATE 5 MG PO TABS
20.0000 mg | ORAL_TABLET | Freq: Every day | ORAL | Status: DC
  Administered 2015-08-12 – 2015-08-13 (×2): 20 mg via ORAL
  Filled 2015-08-12 (×2): qty 4

## 2015-08-12 NOTE — Progress Notes (Signed)
[facility] PROGRESS NOTE  JALAL OVERGAARD [id number] DOB: 1946-01-30 DOA: 08/09/2015 PCP: [name], MD  Brief narrative 69 year old male with history of hypertension, chronic kidney disease stage IV, history of tonsil cancer in 2001 status post radiation, chronic respiratory failure on 3 L via nasal, at home, depression presented to the ED with worsening shortness of breath for 1 day higher oxygen requirement (O2 sat dropped to 70s at home. He also had fever at home of 101F. In the ED vitals were stable. Chest x-ray showed bibasilar airspace disease concerning for multifocal pneumonia. Patient admitted to stepdown unit for acute hypoxic respiratory failure and placed on empiric antibiotics. Transfer to telemetry on 11/28.  Assessment/Plan: Acute hypoxic respiratory failure secondary to multilobar lobar pneumonia Continue empiric Rocephin and azithromycin. Culture so far negative. Patient still has bilateral diffuse wheezing. Continue scheduled nebs and steroid. -Continue O2 via nasal cannula.  Active problem CK D stage IV At baseline. Continue to monitor  Essential hypertension Stable. Continue Norvasc. Switch metoprolol to bisoprolol avoid bronchospasm.  Anemia of chronic kidney disease Stable.  Hypothyroidism Continue Synthroid  Hyperglycemia and prediabetes Steroid induced. A1c of 6. Continue sliding scale coverage.  depression Continue Seroquel and Zoloft  Dyslipidemia Continue statin.  BPH Continue Flomax   DVT prophylaxis: SCDs  Diet: Regular  Code Status: Full code Family Communication: None at bedside Disposition Plan: Home possibly tomorrow if improved   Consultants:  none  Procedures:  None  Antibiotics:  IV Rocephin and azithromycin 11/27--  HPI/Subjective: Seen and examined. Reports his breathing to be better but still wheezy.  Objective: Filed Vitals:   08/12/15 0524 08/12/15 1314  BP: 143/74 153/76  Pulse: 75 83   Temp: 97.6 F (36.4 C) 97.9 F (36.6 C)  Resp: 19 18    Intake/Output Summary (Last 24 hours) at 08/12/15 1548 Last data filed at 08/12/15 1206  Gross per 24 hour  Intake    840 ml  Output   2550 ml  Net  -1710 ml   Filed Weights   08/09/15 0708 08/09/15 1125  Weight: 124.739 kg (275 lb) 128.5 kg (283 lb 4.7 oz)    Exam:   General:  Elderly male not in distress  HEENT: No pallor, moist oral mucosa  Chest: Scattered wheezing bilaterally, fine basilar crackles  Cardiovascular: Normal S1 and S2, no murmurs rub or gallop  GI: Soft, nondistended, nontender  Musculoskeletal: Warm, no edema  Data Reviewed: Basic Metabolic Panel:  Recent Labs Lab 08/09/15 0746 08/09/15 0757 08/09/15 1137 08/10/15 0349  NA 138 138  138 137 140  K 4.1 4.0  4.0 4.0 4.0  CL 97* 96*  96* 98* 100*  CO2 30  --  33* 31  GLUCOSE 114* 111*  111* 252* 147*  BUN 32* 30*  30* 30* 34*  CREATININE 2.39* 2.40*  2.40* 2.45* 2.37*  CALCIUM 8.7*  --  8.4* 8.4*  MG  --   --  1.7  --    Liver Function Tests:  Recent Labs Lab 08/09/15 0746  AST 20  ALT 17  ALKPHOS 108  BILITOT 0.6  PROT 7.2  ALBUMIN 3.5   No results for input(s): LIPASE, AMYLASE in the last 168 hours. No results for input(s): AMMONIA in the last 168 hours. CBC:  Recent Labs Lab 08/09/15 0746 08/09/15 0757 08/09/15 1137 08/10/15 0349  WBC 8.6  --  6.9 7.4  NEUTROABS 7.0  --  6.5  --   HGB 8.5* 9.9*  9.9* 8.9* 8.5*  HCT 28.7* 29.0*  29.0* 29.9* 28.6*  MCV 82.2  --  84.5 83.6  PLT 174  --  190 171   Cardiac Enzymes: No results for input(s): CKTOTAL, CKMB, CKMBINDEX, TROPONINI in the last 168 hours. BNP (last 3 results)  Recent Labs  04/18/15 1554 08/09/15 0746  BNP 496.3* 657.8*    ProBNP (last 3 results) No results for input(s): PROBNP in the last 8760 hours.  CBG:  Recent Labs Lab 08/11/15 1231 08/11/15 1735 08/11/15 2213 08/12/15 0720 08/12/15 1205  GLUCAP 125* 210* 150* 156* 200*     Recent Results (from the past 240 hour(s))  Culture, blood (routine x 2)     Status: None (Preliminary result)   Collection Time: 08/09/15  8:20 AM  Result Value Ref Range Status   Specimen Description LEFT ANTECUBITAL  Final   Special Requests BOTTLES DRAWN AEROBIC AND ANAEROBIC 5CC  Final   Culture   Final    NO GROWTH 3 DAYS Performed at [facility]    Report Status PENDING  Incomplete  Culture, blood (routine x 2)     Status: None (Preliminary result)   Collection Time: 08/09/15  8:25 AM  Result Value Ref Range Status   Specimen Description BLOOD BLOOD LEFT WRIST  Final   Special Requests BOTTLES DRAWN AEROBIC AND ANAEROBIC 5CC  Final   Culture   Final    NO GROWTH 3 DAYS Performed at [facility]    Report Status PENDING  Incomplete  MRSA PCR Screening     Status: None   Collection Time: 08/09/15 10:00 AM  Result Value Ref Range Status   MRSA by PCR NEGATIVE NEGATIVE Final    Comment:        The GeneXpert MRSA Assay (FDA approved for NASAL specimens only), is one component of a comprehensive MRSA colonization surveillance program. It is not intended to diagnose MRSA infection nor to guide or monitor treatment for MRSA infections.   Culture, sputum-assessment     Status: None   Collection Time: 08/09/15  6:51 PM  Result Value Ref Range Status   Specimen Description SPUTUM  Final   Special Requests NONE  Final   Sputum evaluation   Final    THIS SPECIMEN IS ACCEPTABLE. RESPIRATORY CULTURE REPORT TO FOLLOW.   Report Status 08/09/2015 FINAL  Final  Culture, respiratory (NON-Expectorated)     Status: None   Collection Time: 08/09/15  6:51 PM  Result Value Ref Range Status   Specimen Description SPUTUM  Final   Special Requests NONE  Final   Gram Stain   Final    ABUNDANT WBC PRESENT, PREDOMINANTLY PMN FEW SQUAMOUS EPITHELIAL CELLS PRESENT FEW GRAM POSITIVE RODS RARE GRAM NEGATIVE RODS RARE GRAM POSITIVE COCCI IN PAIRS    Culture   Final     NORMAL OROPHARYNGEAL FLORA Performed at [facility]    Report Status 08/12/2015 FINAL  Final     Studies: No results found.  Scheduled Meds: . amLODipine  10 mg Oral q morning - 10a  . aspirin EC  81 mg Oral q morning - 10a  . azithromycin  500 mg Intravenous Q24H  . cefTRIAXone (ROCEPHIN)  IV  1 g Intravenous Q24H  . cholecalciferol  1,000 Units Oral q morning - 10a  . gabapentin  300 mg Oral QHS  . galantamine  8 mg Oral BID WC  . insulin aspart  0-9 Units Subcutaneous TID WC  . ipratropium-albuterol  3 mL Nebulization TID  .  levothyroxine  125 mcg Oral QAC breakfast  . methylPREDNISolone (SOLU-MEDROL) injection  60 mg Intravenous Q12H  . metoprolol  25 mg Oral BID  . pantoprazole  40 mg Oral Daily  . pravastatin  20 mg Oral QHS  . QUEtiapine  125 mg Oral QHS  . sertraline  150 mg Oral QHS  . tamsulosin  0.4 mg Oral QPC breakfast   Continuous Infusions: . sodium chloride 10 mL/hr at 08/09/15 1045      Time spent: 25 minutes    Alecsander Hattabaugh, [facility] Pager [phone] If 7PM-7AM, please contact night-coverage at [email or web address], password [email or web address] 08/12/2015, 3:48 PM  LOS: 3 days

## 2015-08-12 NOTE — Care Management Important Message (Signed)
Important Message  Patient Details  Name: Wesley [name] MRN: [id number] Date of Birth: 1946-07-30   Medicare Important Message Given:  Yes    Camillo Flaming 08/12/2015, 1:04 [place] Message  Patient Details  Name: Wesley [name] MRN: [id number] Date of Birth: 07/20/1946   Medicare Important Message Given:  Yes    Camillo Flaming 08/12/2015, 1:04 PM

## 2015-08-13 ENCOUNTER — Telehealth: Payer: Self-pay | Admitting: Pulmonary Disease

## 2015-08-13 DIAGNOSIS — J189 Pneumonia, unspecified organism: Secondary | ICD-10-CM

## 2015-08-13 DIAGNOSIS — I350 Nonrheumatic aortic (valve) stenosis: Secondary | ICD-10-CM

## 2015-08-13 DIAGNOSIS — E662 Morbid (severe) obesity with alveolar hypoventilation: Secondary | ICD-10-CM | POA: Insufficient documentation

## 2015-08-13 LAB — GLUCOSE, CAPILLARY: GLUCOSE-CAPILLARY: 139 mg/dL — AB (ref 65–99)

## 2015-08-13 MED ORDER — PREDNISONE 50 MG PO TABS: ORAL_TABLET | ORAL | Status: AC

## 2015-08-13 MED ORDER — ALBUTEROL SULFATE HFA 108 (90 BASE) MCG/ACT IN AERS: 2.0000 | INHALATION_SPRAY | Freq: Four times a day (QID) | RESPIRATORY_TRACT | Status: AC | PRN

## 2015-08-13 MED ORDER — LEVOFLOXACIN 750 MG PO TABS: 750.0000 mg | ORAL_TABLET | Freq: Every day | ORAL | Status: AC

## 2015-08-13 NOTE — Discharge Instructions (Signed)

## 2015-08-13 NOTE — Telephone Encounter (Signed)
Spoke with pt wife - pt D/C from [facility] 08/13/2015 - pt had PNA Requesting a follow up with Dr Halford Chessman in the nest 1-2 weeks Last saw TP 05/2015 Pt requests to see VS this visit, refused to see TP again.   Pt was sent home on Levaquin 750mg  1 po QD x 3 days. Pt was on IV abx while admitted; Zithromax 500mg  and Rocephin 1g. Please advise Dr Halford Chessman when you would like to see this patient in office. Thanks .

## 2015-08-13 NOTE — Telephone Encounter (Signed)
LM for pt to return call x 1 

## 2015-08-13 NOTE — Telephone Encounter (Signed)
Pt to be scheduled for appt with VS 08/27/15 @ 345p (working on getting schedule to allow me to place patient) Will hold in my box to follow up on and get patient added.

## 2015-08-13 NOTE — Progress Notes (Signed)
Discussed with patient and spouse discharge instructions.  Both verbalized agreement and understanding.  Patient's IV was discontinued with no complications.  Patient to go down in wheelchair with all belongings to go home in private vehicle.

## 2015-08-13 NOTE — Discharge Summary (Signed)
Physician Discharge Summary  Wesley [name] [id number] DOB: 1945/10/29 DOA: 08/09/2015  PCP: [name], MD  Admit date: 08/09/2015 Discharge date: 08/13/2015  Time spent: 35 minutes  Recommendations for Outpatient Follow-up:  Discharge home with outpatient PCP follow-up. Patient will complete a total 7 day course of antibiotic on 12/3.  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[place])   Anemia in chronic renal disease   Other specified hypothyroidism   S/P radiation therapy   Depression   BPH (benign prostatic hyperplasia)   CKD (chronic kidney disease) stage 4, GFR 15-29 ml/min (HCC)   aortic stenosis   Obesity   Discharge Condition: Fair  Diet recommendation: Low sodium  Filed Weights   08/09/15 0708 08/09/15 1125  Weight: 124.739 kg (275 lb) 128.5 kg (283 lb 4.7 oz)    History of present illness:  Please refer to admission H&P for details, in brief,69 year old male with history of hypertension, chronic kidney disease stage IV, 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 He also had fever at home of 101F. In the ED vitals were stable. Chest x-ray showed bibasilar airspace disease concerning for multifocal pneumonia. Patient admitted to stepdown unit for acute hypoxic respiratory failure and placed on empiric antibiotics. Transferred to telemetry on 11/28.  Hospital Course:  Acute hypoxic respiratory failure secondary to multilobar lobar pneumonia Placed on empiric Rocephin and azithromycin. Culture so far negative. Also require scheduled nebs and steroid for diffuse wheezing  which has improved. He will be discharged on oral prednisone for 5 more days and completed total 7 day course of antibiotic.  -On chronic home O2 for OHS/pulmonary hypertension which is continued. Patient intolerant to CPAP. 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  if pneumonia recurs again he should have a swallow evaluation done. -Patient will follow-up with his PCP and pulmonologist (Dr Halford Chessman) as outpatient. He reports being up-to-date with his vaccination.  Active problem CK D stage IV At baseline. Continue to monitor  Essential hypertension Stable. Continue Norvasc. Switch metoprolol to bisoprolol avoid bronchospasm.  Anemia of chronic kidney disease Stable.  Hypothyroidism Continue Synthroid  Hyperglycemia and prediabetes Steroid induced. A1c of 6. Continue sliding scale coverage.  depression Continue Seroquel and Zoloft  Dyslipidemia Continue statin.  BPH Continue Flomax  Aortic stenosis Moderate as per echo done one month back. Follows with Dr. Burt Knack.  Obesity       Code Status: Full code Family Communication: Wife at bedside Disposition Plan: Home with outpatient follow-up   Consultants:  none  Procedures:  None  Antibiotics:  IV Rocephin and azithromycin 11/27--/1  Levaquin 12/1-12/3  Discharge Exam: Filed Vitals:   08/12/15 2038 08/13/15 0549  BP: 128/63 146/77  Pulse: 57 72  Temp: 97.7 F (36.5 C) 98.3 F (36.8 C)  Resp: 18 20     General: Elderly male not in distress  HEENT: No pallor, moist oral mucosa, supple neck  Chest: Scattered wheezing bilaterally (improved from yesterday)  Cardiovascular: Normal S1 and S2, no murmurs rub or gallop  GI: Soft, nondistended, nontender  Musculoskeletal: Warm, no edema  CNS:  Alert and oriented  Discharge Instructions    Current Discharge Medication List    START taking these medications   Details  albuterol (PROVENTIL  HFA;VENTOLIN HFA) 108 (90 BASE) MCG/ACT inhaler Inhale 2 puffs into the lungs every 6 (six) hours as needed for wheezing or shortness of breath. Qty: 1 Inhaler, Refills: 3    levofloxacin (LEVAQUIN) 750 MG tablet Take 1 tablet (750 mg total) by mouth daily. Qty: 3 tablet, Refills: 0    predniSONE (DELTASONE) 50 MG tablet 1 tablet daily for 5 days Qty: 5 tablet, Refills: 0      CONTINUE these medications which have NOT CHANGED   Details  albuterol (PROVENTIL) (2.5 MG/3ML) 0.083% nebulizer solution Take 2.5 mg by nebulization every 6 (six) hours as needed for wheezing or shortness of breath.     amLODipine (NORVASC) 10 MG tablet Take 10 mg by mouth every morning.     aspirin EC 81 MG tablet Take 81 mg by mouth every morning.    bisacodyl (DULCOLAX) 5 MG EC tablet Take 5 mg by mouth at bedtime as needed for mild constipation or moderate constipation.    carboxymethylcellulose (REFRESH) 1 % ophthalmic solution Apply 1 drop to eye 3 (three) times daily.    cholecalciferol (VITAMIN D) 1000 UNITS tablet Take 1,000 Units by mouth every morning.    gabapentin (NEURONTIN) 300 MG capsule Take 300 mg by mouth at bedtime.     galantamine (RAZADYNE ER) 16 MG 24 hr capsule Take 16 mg by mouth at bedtime.     levothyroxine (SYNTHROID, LEVOTHROID) 125 MCG tablet Take 125 mcg by mouth daily before breakfast.    metoprolol (LOPRESSOR) 100 MG tablet Take 100 mg by mouth 2 (two) times daily.    omeprazole (PRILOSEC) 20 MG capsule Take 20 mg by mouth 2 (two) times daily before a meal.     pravastatin (PRAVACHOL) 20 MG tablet Take 20 mg by mouth at bedtime.    !! QUEtiapine (SEROQUEL) 100 MG tablet Take 100 mg by mouth at bedtime. Take with 25 mg tablet to equal a total dose of 125 mg    !! QUEtiapine (SEROQUEL) 25 MG tablet Take 25 mg by mouth at bedtime. Take with 100mg  tablet to equal a total dose of 125 mg    sertraline (ZOLOFT) 100 MG tablet Take 150 mg by mouth at bedtime.    Tamsulosin HCl  (FLOMAX) 0.4 MG CAPS Take 0.4 mg by mouth daily after breakfast. Once a day     !! - Potential duplicate medications found. Please discuss with provider.     No Known Allergies Follow-up Information    Follow up with [name], MD. Schedule an appointment as soon as possible for a visit in 1 week.   Specialty:  Family Medicine   Contact information:   [place] [address] [phone]        The results of significant diagnostics from this hospitalization (including imaging, microbiology, ancillary and laboratory) are listed below for reference.    Significant Diagnostic Studies: Dg Chest 2 View  08/09/2015  CLINICAL DATA:  Hypoxia EXAM: CHEST  2 VIEW COMPARISON:  06/08/2015 FINDINGS: Cardiac silhouette is enlarged. There is patchy airspace disease in the RIGHT lower lobe not improved. LEFT lower lobe airspace disease is slightly worsened. No pneumothorax. IMPRESSION: Bibasilar airspace disease is concerning for multifocal pneumonia versus pulmonary edema. Mild worsening at the LEFT lung base. Electronically Signed   By: Suzy Bouchard M.D.   On:  08/09/2015 07:57    Microbiology: Recent Results (from the past 240 hour(s))  Culture, blood (routine x 2)     Status: None (Preliminary result)   Collection Time: 08/09/15  8:20 AM  Result Value Ref Range Status   Specimen Description LEFT ANTECUBITAL  Final   Special Requests BOTTLES DRAWN AEROBIC AND ANAEROBIC 5CC  Final   Culture   Final    NO GROWTH 3 DAYS Performed at [facility]    Report Status PENDING  Incomplete  Culture, blood (routine x 2)     Status: None (Preliminary result)   Collection Time: 08/09/15  8:25 AM  Result Value Ref Range Status   Specimen Description BLOOD BLOOD LEFT WRIST  Final   Special Requests BOTTLES DRAWN AEROBIC AND ANAEROBIC 5CC  Final   Culture   Final    NO GROWTH 3 DAYS Performed at [facility]    Report Status PENDING  Incomplete  MRSA PCR  Screening     Status: None   Collection Time: 08/09/15 10:00 AM  Result Value Ref Range Status   MRSA by PCR NEGATIVE NEGATIVE Final    Comment:        The GeneXpert MRSA Assay (FDA approved for NASAL specimens only), is one component of a comprehensive MRSA colonization surveillance program. It is not intended to diagnose MRSA infection nor to guide or monitor treatment for MRSA infections.   Culture, sputum-assessment     Status: None   Collection Time: 08/09/15  6:51 PM  Result Value Ref Range Status   Specimen Description SPUTUM  Final   Special Requests NONE  Final   Sputum evaluation   Final    THIS SPECIMEN IS ACCEPTABLE. RESPIRATORY CULTURE REPORT TO FOLLOW.   Report Status 08/09/2015 FINAL  Final  Culture, respiratory (NON-Expectorated)     Status: None   Collection Time: 08/09/15  6:51 PM  Result Value Ref Range Status   Specimen Description SPUTUM  Final   Special Requests NONE  Final   Gram Stain   Final    ABUNDANT WBC PRESENT, PREDOMINANTLY PMN FEW SQUAMOUS EPITHELIAL CELLS PRESENT FEW GRAM POSITIVE RODS RARE GRAM NEGATIVE RODS RARE GRAM POSITIVE COCCI IN PAIRS    Culture   Final    NORMAL OROPHARYNGEAL FLORA Performed at [facility]    Report Status 08/12/2015 FINAL  Final     Labs: Basic Metabolic Panel:  Recent Labs Lab 08/09/15 0746 08/09/15 0757 08/09/15 1137 08/10/15 0349  NA 138 138  138 137 140  K 4.1 4.0  4.0 4.0 4.0  CL 97* 96*  96* 98* 100*  CO2 30  --  33* 31  GLUCOSE 114* 111*  111* 252* 147*  BUN 32* 30*  30* 30* 34*  CREATININE 2.39* 2.40*  2.40* 2.45* 2.37*  CALCIUM 8.7*  --  8.4* 8.4*  MG  --   --  1.7  --    Liver Function Tests:  Recent Labs Lab 08/09/15 0746  AST 20  ALT 17  ALKPHOS 108  BILITOT 0.6  PROT 7.2  ALBUMIN 3.5   No results for input(s): LIPASE, AMYLASE in the last 168 hours. No results for input(s): AMMONIA in the last 168 hours. CBC:  Recent Labs Lab 08/09/15 0746  08/09/15 0757 08/09/15 1137 08/10/15 0349  WBC 8.6  --  6.9 7.4  NEUTROABS 7.0  --  6.5  --   HGB 8.5* 9.9*  9.9* 8.9* 8.5*  HCT 28.7* 29.0*  29.0* 29.9* 28.6*  MCV 82.2  --  84.5 83.6  PLT 174  --  190 171   Cardiac Enzymes: No results for input(s): CKTOTAL, CKMB, CKMBINDEX, TROPONINI in the last 168 hours. BNP: BNP (last 3 results)  Recent Labs  04/18/15 1554 08/09/15 0746  BNP 496.3* 657.8*    ProBNP (last 3 results) No results for input(s): PROBNP in the last 8760 hours.  CBG:  Recent Labs Lab 08/12/15 0720 08/12/15 1205 08/12/15 1645 08/12/15 2317 08/13/15 0732  GLUCAP 156* 200* 154* 133* 139*       Signed:  Gabryella Murfin  [facility] 08/13/2015, 9:58 AM

## 2015-08-13 NOTE — Telephone Encounter (Signed)
Can try to double book visit with me in next 2 to 3 weeks.

## 2015-08-14 LAB — CULTURE, BLOOD (ROUTINE X 2)
Culture: NO GROWTH
Culture: NO GROWTH

## 2015-08-14 NOTE — Telephone Encounter (Signed)
Patient is currently on Dr. Juanetta Gosling schedule for 08/27/15 at 3:45pm. Nothing further needed. Closing encounter

## 2015-08-20 ENCOUNTER — Ambulatory Visit: Payer: Medicare Other | Admitting: Internal Medicine

## 2015-08-20 ENCOUNTER — Emergency Department (HOSPITAL_COMMUNITY): Payer: Medicare Other

## 2015-08-20 ENCOUNTER — Telehealth: Payer: Self-pay | Admitting: Pulmonary Disease

## 2015-08-20 ENCOUNTER — Encounter (HOSPITAL_COMMUNITY): Payer: Self-pay

## 2015-08-20 ENCOUNTER — Inpatient Hospital Stay (HOSPITAL_COMMUNITY)
Admission: EM | Admit: 2015-08-20 | Discharge: 2015-08-29 | DRG: 871 | Disposition: A | Discharge status: Home / Self Care | Payer: Medicare Other | Attending: Internal Medicine | Admitting: Internal Medicine

## 2015-08-20 DIAGNOSIS — Z79899 Other long term (current) drug therapy: Secondary | ICD-10-CM | POA: Diagnosis not present

## 2015-08-20 DIAGNOSIS — C109 Malignant neoplasm of oropharynx, unspecified: Secondary | ICD-10-CM | POA: Diagnosis present

## 2015-08-20 DIAGNOSIS — K220 Achalasia of cardia: Secondary | ICD-10-CM | POA: Diagnosis present

## 2015-08-20 DIAGNOSIS — Z9981 Dependence on supplemental oxygen: Secondary | ICD-10-CM | POA: Diagnosis not present

## 2015-08-20 DIAGNOSIS — Z923 Personal history of irradiation: Secondary | ICD-10-CM

## 2015-08-20 DIAGNOSIS — J189 Pneumonia, unspecified organism: Secondary | ICD-10-CM | POA: Diagnosis not present

## 2015-08-20 DIAGNOSIS — Z7982 Long term (current) use of aspirin: Secondary | ICD-10-CM | POA: Diagnosis not present

## 2015-08-20 DIAGNOSIS — R1310 Dysphagia, unspecified: Secondary | ICD-10-CM | POA: Diagnosis present

## 2015-08-20 DIAGNOSIS — Z85819 Personal history of malignant neoplasm of unspecified site of lip, oral cavity, and pharynx: Secondary | ICD-10-CM | POA: Diagnosis present

## 2015-08-20 DIAGNOSIS — A419 Sepsis, unspecified organism: Secondary | ICD-10-CM | POA: Diagnosis not present

## 2015-08-20 DIAGNOSIS — J690 Pneumonitis due to inhalation of food and vomit: Secondary | ICD-10-CM | POA: Diagnosis present

## 2015-08-20 DIAGNOSIS — N184 Chronic kidney disease, stage 4 (severe): Secondary | ICD-10-CM | POA: Diagnosis present

## 2015-08-20 DIAGNOSIS — Z6841 Body Mass Index (BMI) 40.0 and over, adult: Secondary | ICD-10-CM

## 2015-08-20 DIAGNOSIS — Z8701 Personal history of pneumonia (recurrent): Secondary | ICD-10-CM

## 2015-08-20 DIAGNOSIS — E662 Morbid (severe) obesity with alveolar hypoventilation: Secondary | ICD-10-CM | POA: Diagnosis present

## 2015-08-20 DIAGNOSIS — I10 Essential (primary) hypertension: Secondary | ICD-10-CM | POA: Diagnosis not present

## 2015-08-20 DIAGNOSIS — R0602 Shortness of breath: Secondary | ICD-10-CM | POA: Diagnosis not present

## 2015-08-20 DIAGNOSIS — Y95 Nosocomial condition: Secondary | ICD-10-CM | POA: Diagnosis present

## 2015-08-20 DIAGNOSIS — J9601 Acute respiratory failure with hypoxia: Secondary | ICD-10-CM | POA: Diagnosis not present

## 2015-08-20 DIAGNOSIS — I129 Hypertensive chronic kidney disease with stage 1 through stage 4 chronic kidney disease, or unspecified chronic kidney disease: Secondary | ICD-10-CM | POA: Diagnosis present

## 2015-08-20 HISTORY — DX: Chronic kidney disease, unspecified: N18.9

## 2015-08-20 HISTORY — DX: Malignant neoplasm of oropharynx, unspecified: C10.9

## 2015-08-20 LAB — CBC WITH DIFFERENTIAL/PLATELET
BASOS ABS: 0 10*3/uL (ref 0.0–0.1)
BASOS PCT: 0 %
EOS ABS: 0.1 10*3/uL (ref 0.0–0.7)
Eosinophils Relative: 0 %
HCT: 32.5 % — ABNORMAL LOW (ref 39.0–52.0)
HEMOGLOBIN: 9.3 g/dL — AB (ref 13.0–17.0)
Lymphocytes Relative: 4 %
Lymphs Abs: 1 10*3/uL (ref 0.7–4.0)
MCH: 24.3 pg — ABNORMAL LOW (ref 26.0–34.0)
MCHC: 28.6 g/dL — AB (ref 30.0–36.0)
MCV: 85.1 fL (ref 78.0–100.0)
MONO ABS: 1.8 10*3/uL — AB (ref 0.1–1.0)
MONOS PCT: 8 %
NEUTROS PCT: 88 %
Neutro Abs: 20.7 10*3/uL — ABNORMAL HIGH (ref 1.7–7.7)
Platelets: 275 10*3/uL (ref 150–400)
RBC: 3.82 MIL/uL — ABNORMAL LOW (ref 4.22–5.81)
RDW: 17.4 % — AB (ref 11.5–15.5)
WBC: 23.7 10*3/uL — ABNORMAL HIGH (ref 4.0–10.5)

## 2015-08-20 LAB — BLOOD GAS, VENOUS
ACID-BASE EXCESS: 3.6 mmol/L — AB (ref 0.0–2.0)
Bicarbonate: 30.5 mEq/L — ABNORMAL HIGH (ref 20.0–24.0)
O2 Saturation: 86.3 %
PATIENT TEMPERATURE: 98.6
TCO2: 29.2 mmol/L (ref 0–100)
pCO2, Ven: 63.4 mmHg — ABNORMAL HIGH (ref 45.0–50.0)
pH, Ven: 7.303 — ABNORMAL HIGH (ref 7.250–7.300)
pO2, Ven: 54.3 mmHg — ABNORMAL HIGH (ref 30.0–45.0)

## 2015-08-20 LAB — URINALYSIS, ROUTINE W REFLEX MICROSCOPIC
Bilirubin Urine: NEGATIVE
GLUCOSE, UA: NEGATIVE mg/dL
KETONES UR: NEGATIVE mg/dL
LEUKOCYTES UA: NEGATIVE
Nitrite: NEGATIVE
PROTEIN: NEGATIVE mg/dL
Specific Gravity, Urine: 1.015 (ref 1.005–1.030)
pH: 5 (ref 5.0–8.0)

## 2015-08-20 LAB — URINE MICROSCOPIC-ADD ON

## 2015-08-20 LAB — COMPREHENSIVE METABOLIC PANEL
ALK PHOS: 80 U/L (ref 38–126)
ALT: 19 U/L (ref 17–63)
ANION GAP: 9 (ref 5–15)
AST: 11 U/L — AB (ref 15–41)
Albumin: 2.9 g/dL — ABNORMAL LOW (ref 3.5–5.0)
BILIRUBIN TOTAL: 0.4 mg/dL (ref 0.3–1.2)
BUN: 57 mg/dL — ABNORMAL HIGH (ref 6–20)
CALCIUM: 8.3 mg/dL — AB (ref 8.9–10.3)
CO2: 33 mmol/L — ABNORMAL HIGH (ref 22–32)
Chloride: 101 mmol/L (ref 101–111)
Creatinine, Ser: 2.56 mg/dL — ABNORMAL HIGH (ref 0.61–1.24)
GFR, EST AFRICAN AMERICAN: 28 mL/min — AB (ref >60)
GFR, EST NON AFRICAN AMERICAN: 24 mL/min — AB (ref >60)
GLUCOSE: 106 mg/dL — AB (ref 65–99)
Potassium: 4.1 mmol/L (ref 3.5–5.1)
Sodium: 143 mmol/L (ref 135–145)
TOTAL PROTEIN: 6.3 g/dL — AB (ref 6.5–8.1)

## 2015-08-20 LAB — I-STAT TROPONIN, ED: Troponin i, poc: 0.02 ng/mL (ref 0.00–0.08)

## 2015-08-20 LAB — I-STAT CG4 LACTIC ACID, ED: LACTIC ACID, VENOUS: 0.47 mmol/L — AB (ref 0.5–2.0)

## 2015-08-20 LAB — MRSA PCR SCREENING: MRSA by PCR: NEGATIVE

## 2015-08-20 LAB — STREP PNEUMONIAE URINARY ANTIGEN: Strep Pneumo Urinary Antigen: NEGATIVE

## 2015-08-20 LAB — PROCALCITONIN: Procalcitonin: 0.24 ng/mL

## 2015-08-20 MED ORDER — ACETAMINOPHEN 650 MG RE SUPP
650.0000 mg | Freq: Four times a day (QID) | RECTAL | Status: DC | PRN
Start: 2015-08-20 — End: 2015-08-29

## 2015-08-20 MED ORDER — SODIUM CHLORIDE 0.9 % IV SOLN: INTRAVENOUS | Status: DC

## 2015-08-20 MED ORDER — PIPERACILLIN-TAZOBACTAM 3.375 G IVPB
3.3750 g | Freq: Three times a day (TID) | INTRAVENOUS | Status: DC
  Administered 2015-08-20 – 2015-08-23 (×8): 3.375 g via INTRAVENOUS
  Filled 2015-08-20 (×9): qty 50

## 2015-08-20 MED ORDER — SODIUM CHLORIDE 0.9 % IV BOLUS (SEPSIS)
1000.0000 mL | Freq: Once | INTRAVENOUS | Status: AC
  Administered 2015-08-20: 1000 mL via INTRAVENOUS

## 2015-08-20 MED ORDER — QUETIAPINE FUMARATE 25 MG PO TABS
25.0000 mg | ORAL_TABLET | Freq: Every day | ORAL | Status: DC
  Administered 2015-08-20: 25 mg via ORAL
  Filled 2015-08-20 (×2): qty 1

## 2015-08-20 MED ORDER — SERTRALINE HCL 50 MG PO TABS
150.0000 mg | ORAL_TABLET | Freq: Every day | ORAL | Status: DC
  Administered 2015-08-20 – 2015-08-28 (×9): 150 mg via ORAL
  Filled 2015-08-20 (×10): qty 1

## 2015-08-20 MED ORDER — EPINEPHRINE HCL 0.1 MG/ML IJ SOSY
PREFILLED_SYRINGE | INTRAMUSCULAR | Status: AC
  Filled 2015-08-20: qty 10

## 2015-08-20 MED ORDER — IPRATROPIUM-ALBUTEROL 0.5-2.5 (3) MG/3ML IN SOLN
3.0000 mL | Freq: Four times a day (QID) | RESPIRATORY_TRACT | Status: DC
  Administered 2015-08-21 – 2015-08-29 (×32): 3 mL via RESPIRATORY_TRACT
  Filled 2015-08-20 (×32): qty 3

## 2015-08-20 MED ORDER — ONDANSETRON HCL 4 MG/2ML IJ SOLN: 4.0000 mg | Freq: Four times a day (QID) | INTRAMUSCULAR | Status: DC | PRN

## 2015-08-20 MED ORDER — SODIUM CHLORIDE 0.9 % IJ SOLN
3.0000 mL | Freq: Two times a day (BID) | INTRAMUSCULAR | Status: DC
  Administered 2015-08-20 – 2015-08-28 (×12): 3 mL via INTRAVENOUS

## 2015-08-20 MED ORDER — GALANTAMINE HYDROBROMIDE ER 8 MG PO CP24: 16.0000 mg | ORAL_CAPSULE | Freq: Every day | ORAL | Status: DC

## 2015-08-20 MED ORDER — ASPIRIN EC 81 MG PO TBEC
81.0000 mg | DELAYED_RELEASE_TABLET | Freq: Every morning | ORAL | Status: DC
  Administered 2015-08-21 – 2015-08-29 (×9): 81 mg via ORAL
  Filled 2015-08-20 (×9): qty 1

## 2015-08-20 MED ORDER — METOPROLOL TARTRATE 100 MG PO TABS
100.0000 mg | ORAL_TABLET | Freq: Two times a day (BID) | ORAL | Status: DC
  Administered 2015-08-20 – 2015-08-29 (×18): 100 mg via ORAL
  Filled 2015-08-20: qty 1
  Filled 2015-08-20: qty 4
  Filled 2015-08-20 (×3): qty 1
  Filled 2015-08-20: qty 4
  Filled 2015-08-20 (×15): qty 1

## 2015-08-20 MED ORDER — ACETAMINOPHEN 325 MG PO TABS
650.0000 mg | ORAL_TABLET | Freq: Once | ORAL | Status: DC | PRN
  Filled 2015-08-20: qty 2

## 2015-08-20 MED ORDER — ACETAMINOPHEN 325 MG PO TABS: 650.0000 mg | ORAL_TABLET | Freq: Four times a day (QID) | ORAL | Status: DC | PRN

## 2015-08-20 MED ORDER — CARBOXYMETHYLCELLULOSE SODIUM 1 % OP SOLN: 1.0000 [drp] | Freq: Three times a day (TID) | OPHTHALMIC | Status: DC

## 2015-08-20 MED ORDER — AMLODIPINE BESYLATE 10 MG PO TABS
10.0000 mg | ORAL_TABLET | Freq: Every morning | ORAL | Status: DC
  Administered 2015-08-21 – 2015-08-29 (×9): 10 mg via ORAL
  Filled 2015-08-20 (×9): qty 1

## 2015-08-20 MED ORDER — HEPARIN SODIUM (PORCINE) 5000 UNIT/ML IJ SOLN
5000.0000 [IU] | Freq: Three times a day (TID) | INTRAMUSCULAR | Status: DC
  Administered 2015-08-20 – 2015-08-28 (×25): 5000 [IU] via SUBCUTANEOUS
  Filled 2015-08-20 (×29): qty 1

## 2015-08-20 MED ORDER — PANTOPRAZOLE SODIUM 40 MG PO TBEC
40.0000 mg | DELAYED_RELEASE_TABLET | Freq: Every day | ORAL | Status: DC
  Administered 2015-08-21 – 2015-08-26 (×6): 40 mg via ORAL
  Filled 2015-08-20 (×6): qty 1

## 2015-08-20 MED ORDER — VANCOMYCIN HCL 10 G IV SOLR
2000.0000 mg | Freq: Once | INTRAVENOUS | Status: AC
  Administered 2015-08-20: 2000 mg via INTRAVENOUS
  Filled 2015-08-20: qty 2000

## 2015-08-20 MED ORDER — DOCUSATE SODIUM 100 MG PO CAPS
100.0000 mg | ORAL_CAPSULE | Freq: Every day | ORAL | Status: DC
  Administered 2015-08-20 – 2015-08-29 (×10): 100 mg via ORAL
  Filled 2015-08-20 (×10): qty 1

## 2015-08-20 MED ORDER — PRAVASTATIN SODIUM 20 MG PO TABS
20.0000 mg | ORAL_TABLET | Freq: Every day | ORAL | Status: DC
  Administered 2015-08-20 – 2015-08-28 (×9): 20 mg via ORAL
  Filled 2015-08-20 (×10): qty 1

## 2015-08-20 MED ORDER — METHYLPREDNISOLONE SODIUM SUCC 125 MG IJ SOLR
125.0000 mg | Freq: Once | INTRAMUSCULAR | Status: AC
  Administered 2015-08-20: 125 mg via INTRAVENOUS
  Filled 2015-08-20: qty 2

## 2015-08-20 MED ORDER — LEVOTHYROXINE SODIUM 125 MCG PO TABS
125.0000 ug | ORAL_TABLET | Freq: Every day | ORAL | Status: DC
  Administered 2015-08-21 – 2015-08-29 (×9): 125 ug via ORAL
  Filled 2015-08-20 (×10): qty 1

## 2015-08-20 MED ORDER — VANCOMYCIN HCL 10 G IV SOLR
1500.0000 mg | INTRAVENOUS | Status: DC
  Administered 2015-08-21 – 2015-08-22 (×2): 1500 mg via INTRAVENOUS
  Filled 2015-08-20 (×3): qty 1500

## 2015-08-20 MED ORDER — METHYLPREDNISOLONE SODIUM SUCC 125 MG IJ SOLR
60.0000 mg | Freq: Two times a day (BID) | INTRAMUSCULAR | Status: DC
  Administered 2015-08-21 – 2015-08-22 (×3): 60 mg via INTRAVENOUS
  Filled 2015-08-20 (×3): qty 0.96
  Filled 2015-08-20: qty 2
  Filled 2015-08-20: qty 0.96

## 2015-08-20 MED ORDER — TAMSULOSIN HCL 0.4 MG PO CAPS
0.4000 mg | ORAL_CAPSULE | Freq: Every day | ORAL | Status: DC
  Administered 2015-08-21 – 2015-08-29 (×9): 0.4 mg via ORAL
  Filled 2015-08-20 (×9): qty 1

## 2015-08-20 MED ORDER — PIPERACILLIN-TAZOBACTAM 3.375 G IVPB 30 MIN
3.3750 g | INTRAVENOUS | Status: AC
  Administered 2015-08-20: 3.375 g via INTRAVENOUS
  Filled 2015-08-20: qty 50

## 2015-08-20 MED ORDER — GALANTAMINE HYDROBROMIDE 4 MG PO TABS
8.0000 mg | ORAL_TABLET | Freq: Two times a day (BID) | ORAL | Status: DC
  Administered 2015-08-20 – 2015-08-29 (×18): 8 mg via ORAL
  Filled 2015-08-20 (×21): qty 2

## 2015-08-20 MED ORDER — IPRATROPIUM-ALBUTEROL 0.5-2.5 (3) MG/3ML IN SOLN
3.0000 mL | Freq: Four times a day (QID) | RESPIRATORY_TRACT | Status: DC
  Administered 2015-08-20: 3 mL via RESPIRATORY_TRACT
  Filled 2015-08-20: qty 3

## 2015-08-20 MED ORDER — GABAPENTIN 300 MG PO CAPS
300.0000 mg | ORAL_CAPSULE | Freq: Every day | ORAL | Status: DC
  Administered 2015-08-20 – 2015-08-28 (×9): 300 mg via ORAL
  Filled 2015-08-20 (×10): qty 1

## 2015-08-20 MED ORDER — QUETIAPINE FUMARATE 100 MG PO TABS
100.0000 mg | ORAL_TABLET | Freq: Every day | ORAL | Status: DC
  Administered 2015-08-20: 100 mg via ORAL
  Filled 2015-08-20 (×2): qty 1

## 2015-08-20 MED ORDER — ONDANSETRON HCL 4 MG PO TABS: 4.0000 mg | ORAL_TABLET | Freq: Four times a day (QID) | ORAL | Status: DC | PRN

## 2015-08-20 MED ORDER — IPRATROPIUM-ALBUTEROL 0.5-2.5 (3) MG/3ML IN SOLN: 3.0000 mL | RESPIRATORY_TRACT | Status: DC | PRN

## 2015-08-20 MED ORDER — POLYVINYL ALCOHOL 1.4 % OP SOLN
1.0000 [drp] | Freq: Three times a day (TID) | OPHTHALMIC | Status: DC
  Administered 2015-08-20 – 2015-08-29 (×25): 1 [drp] via OPHTHALMIC
  Filled 2015-08-20 (×2): qty 15

## 2015-08-20 NOTE — ED Notes (Signed)
Pt given turkey sandwich and diet coke  

## 2015-08-20 NOTE — Telephone Encounter (Signed)
Spoke with pt's wife, was hospitalized last week for pna-since coming home pt has finished abx and prednisone given at discharge-pt since yesterday c/o increased wheezing, low 02 sats, prod cough with clear mucus. Denies sinus congestion, fever.  Pt is now on 6lpm to keep 02 sats above 90%.    Pt scheduled with [facility] as an acute pt at 10:45 today.  Nothing further needed.

## 2015-08-20 NOTE — Progress Notes (Signed)
Utilization Review completed.  Lynora Dymond RN CM  

## 2015-08-20 NOTE — ED Notes (Signed)
Bed in ICU being cleaned. Will call back in 10 minutes.

## 2015-08-20 NOTE — ED Notes (Signed)
This NT made 2 attempts at blood draw, no success

## 2015-08-20 NOTE — ED Notes (Signed)
Pt presents via EMS from home with c/o shortness of breath/low O2 sats. Per EMS, pt does have some cognitive issues, does take aricept. Pt wears O2 at home all the time, 2.5 liters. Pt was hospitalized for pneumonia earlier this year in August. Pt was then recently hospitalized again for pneumonia approx one week ago for about 4 days. Pt has finished his most recent doses of antibiotics. Temp 100.8 for EMS, HR 90. Per EMS, O2 sat was 58% on RA upon arrival. Pt is on NRB at this time, 11L at 94%. CBG 136 per EMS.

## 2015-08-20 NOTE — ED Notes (Signed)
Pt O2 sat dropped to 85 while pt sleeping. Pt woken, sat returned to 92% on 6L [place]

## 2015-08-20 NOTE — ED Notes (Signed)
Admitting MD at bedside.

## 2015-08-20 NOTE — Telephone Encounter (Signed)
Calling back because pt as gotten worse and is wheezing badly, please cb pt wife [phone]

## 2015-08-20 NOTE — ED Provider Notes (Addendum)
CSN: [id number]     Arrival date & time 08/20/15  1023 History   First MD Initiated Contact with Patient 08/20/15 1048     Chief Complaint  Patient presents with  . Shortness of Breath  . Fever     (Consider location/radiation/quality/duration/timing/severity/associated sxs/prior Treatment) HPI   Patient is a 69 year old male with past mental history significant for chronic O2 dependence for obesity hypoventilation syndrome, recent pneumonia treated with 5 days in the hospital. Patient recently finished outpatient antibiotics 5 days ago.(Of Levaquin) patient was in his normal state of health yesterday and was able to leave the house on his chronic O2. However overnight last night patient required increased from 3 L oxygen to 6 L and 8 L. Patient was hypoxic to the 36s and wife was unable to get him oxygenated's therefore called EMS. On EMS arrival noted to be hypoxic. Patient notes fever last night increase in cough. Febrile on arrival here.  History reviewed. No pertinent past medical history. Past Surgical History  Procedure Laterality Date  . Knee arthroscopy  11/08/2002    right knee examination  . Knee arthroscopy w/ partial medial meniscectomy  11/08/2002  . Chondroplasty  11/08/2002    extensive, of the patellofemoral joint  . Chondroplasty  11/08/2002    lesser extent chondroplasty medial femoral condyle   Family History  Problem Relation Age of Onset  . Adopted: Yes  . Aneurysm Mother     deceased from brain aneurysm   Social History  Substance Use Topics  . Smoking status: Never Smoker   . Smokeless tobacco: Never Used     Comment: smoked 1 pack a month  . Alcohol Use: No    Review of Systems  Constitutional: Positive for fever, activity change and fatigue.  Respiratory: Positive for cough and shortness of breath.   Cardiovascular: Negative for chest pain.  Gastrointestinal: Negative for abdominal pain.  Genitourinary: Negative for dysuria.  Musculoskeletal:  Negative for myalgias.  Neurological: Negative for dizziness.  Psychiatric/Behavioral: Negative for behavioral problems and agitation.  All other systems reviewed and are negative.     Allergies  Review of patient's allergies indicates no known allergies.  Home Medications   Prior to Admission medications   Medication Sig Start Date End Date Taking? Authorizing Provider  albuterol (PROVENTIL HFA;VENTOLIN HFA) 108 (90 BASE) MCG/ACT inhaler Inhale 2 puffs into the lungs every 6 (six) hours as needed for wheezing or shortness of breath. 08/13/15  Yes Nishant Dhungel, MD  albuterol (PROVENTIL) (2.5 MG/3ML) 0.083% nebulizer solution Take 2.5 mg by nebulization every 6 (six) hours as needed for wheezing or shortness of breath.  07/18/14  Yes Historical Provider, MD  amLODipine (NORVASC) 10 MG tablet Take 10 mg by mouth every morning.    Yes Historical Provider, MD  aspirin EC 81 MG tablet Take 81 mg by mouth every morning.   Yes Historical Provider, MD  carboxymethylcellulose (REFRESH) 1 % ophthalmic solution Apply 1 drop to eye 3 (three) times daily.   Yes Historical Provider, MD  cholecalciferol (VITAMIN D) 1000 UNITS tablet Take 1,000 Units by mouth daily.    Yes Historical Provider, MD  docusate sodium (COLACE) 100 MG capsule Take 100 mg by mouth daily.   Yes Historical Provider, MD  gabapentin (NEURONTIN) 300 MG capsule Take 300 mg by mouth at bedtime.    Yes Historical Provider, MD  galantamine (RAZADYNE ER) 16 MG 24 hr capsule Take 16 mg by mouth at bedtime.    Yes Historical Provider, MD  levothyroxine (SYNTHROID, LEVOTHROID) 125 MCG tablet Take 125 mcg by mouth daily before breakfast.   Yes Historical Provider, MD  metoprolol (LOPRESSOR) 100 MG tablet Take 100 mg by mouth 2 (two) times daily.   Yes Historical Provider, MD  omeprazole (PRILOSEC) 20 MG capsule Take 20 mg by mouth 2 (two) times daily before a meal.    Yes Historical Provider, MD  pravastatin (PRAVACHOL) 20 MG tablet Take 20  mg by mouth at bedtime.   Yes Historical Provider, MD  QUEtiapine (SEROQUEL) 100 MG tablet Take 100 mg by mouth at bedtime. Take with 25 mg tablet to equal a total dose of 125 mg   Yes Historical Provider, MD  QUEtiapine (SEROQUEL) 25 MG tablet Take 25 mg by mouth at bedtime. Take with 100mg  tablet to equal a total dose of 125 mg   Yes Historical Provider, MD  sertraline (ZOLOFT) 100 MG tablet Take 150 mg by mouth at bedtime.   Yes Historical Provider, MD  Tamsulosin HCl (FLOMAX) 0.4 MG CAPS Take 0.4 mg by mouth daily after breakfast. Once a day 11/14/10  Yes Historical Provider, MD   BP 143/87 mmHg  Pulse 80  Temp(Src) 100.6 F (38.1 C) (Rectal)  Resp 20  Ht 5\' 10"  (1.778 m)  Wt 280 lb (127.007 kg)  BMI 40.18 kg/m2  SpO2 92% Physical Exam  Constitutional: He is oriented to person, place, and time. He appears well-nourished.  Obese 69 year old male.  HENT:  Head: Normocephalic.  Mouth/Throat: Oropharynx is clear and moist.  Eyes: Conjunctivae are normal.  Neck: No tracheal deviation present.  Cardiovascular: Normal rate.   Murmur heard. Pulmonary/Chest: Effort normal. No stridor.  Rhonchi in bilateral lower lung fields. Patient requiring 6 L of oxygen to keep him above 90%.  Abdominal: Soft. There is no tenderness. There is no guarding.  Musculoskeletal: Normal range of motion.  Neurological: He is oriented to person, place, and time. No cranial nerve deficit.  Patient mildly somnolent.  Skin: Skin is warm and dry. No rash noted. He is not diaphoretic.  Psychiatric: Thought content normal.  Nursing note and vitals reviewed.   ED Course  Procedures (including critical care time) Labs Review Labs Reviewed  COMPREHENSIVE METABOLIC PANEL - Abnormal; Notable for the following:    CO2 33 (*)    Glucose, Bld 106 (*)    BUN 57 (*)    Creatinine, Ser 2.56 (*)    Calcium 8.3 (*)    Total Protein 6.3 (*)    Albumin 2.9 (*)    AST 11 (*)    GFR calc non Af Amer 24 (*)    GFR calc  Af Amer 28 (*)    All other components within normal limits  URINALYSIS, ROUTINE W REFLEX MICROSCOPIC (NOT AT [facility]) - Abnormal; Notable for the following:    Hgb urine dipstick TRACE (*)    All other components within normal limits  CBC WITH DIFFERENTIAL/PLATELET - Abnormal; Notable for the following:    WBC 23.7 (*)    RBC 3.82 (*)    Hemoglobin 9.3 (*)    HCT 32.5 (*)    MCH 24.3 (*)    MCHC 28.6 (*)    RDW 17.4 (*)    Neutro Abs 20.7 (*)    Monocytes Absolute 1.8 (*)    All other components within normal limits  BLOOD GAS, VENOUS - Abnormal; Notable for the following:    pH, Ven 7.303 (*)    pCO2, Ven 63.4 (*)  pO2, Ven 54.3 (*)    Bicarbonate 30.5 (*)    Acid-Base Excess 3.6 (*)    All other components within normal limits  URINE MICROSCOPIC-ADD ON - Abnormal; Notable for the following:    Squamous Epithelial / LPF 0-5 (*)    Bacteria, UA RARE (*)    Casts HYALINE CASTS (*)    All other components within normal limits  I-STAT CG4 LACTIC ACID, ED - Abnormal; Notable for the following:    Lactic Acid, Venous 0.47 (*)    All other components within normal limits  CULTURE, BLOOD (ROUTINE X 2)  CULTURE, BLOOD (ROUTINE X 2)  URINE CULTURE  I-STAT TROPOININ, ED  I-STAT CG4 LACTIC ACID, ED    Imaging Review Dg Chest 2 View  08/20/2015  CLINICAL DATA:  69 year old male with shortness of breath. Subsequent encounter. EXAM: CHEST  2 VIEW COMPARISON:  Several prior chest x-rays most recent 08/09/2015. FINDINGS: Bilateral diffuse airspace disease sparing the right upper lobe has progressed when compared to prior examination (most notable change left upper lobe). This may represent multi focal pneumonia but will need to be followed until complete clearance to exclude underlying malignancy. Limited for detecting pulmonary edema given the diffuse airspace disease. Cardiomegaly. No pneumothorax. IMPRESSION: Bilateral diffuse airspace disease sparing the right upper lobe has progressed  when compared to prior examination (most notable change left upper lobe). This may represent multi focal pneumonia but will need to be followed until complete clearance to exclude underlying malignancy. Limited for detecting pulmonary edema given the diffuse airspace disease. Cardiomegaly. Electronically Signed   By: Genia Del M.D.   On: 08/20/2015 12:46   I have personally reviewed and evaluated these images and lab results as part of my medical decision-making.   EKG Interpretation None      MDM   Final diagnoses:  None   patient is a 69 year old male with obesity hypoventilation syndrome presenting after recent inpatient stay for pneumonia. On presentation he is febrile, hypoxic. Concern for pneumonia. HCAP. Given his hypoxia, lung findings, fever.  VBG ordered as well as troponin lactic chest x-ray. We'll treat with broad-spectrum antibiotics, fluids.   2:34 PM Chest x-ray shows multifocal pneumonia. We'll treat with broad-spectrum antibiotics. Will likely need to get CAT scan as inpatient given the reading. Concern for underlying patholgoy.   Makeya Hilgert Julio Alm, MD 08/20/15 [place], MD 08/20/15 1535

## 2015-08-20 NOTE — ED Notes (Signed)
Bed: WA19 Expected date:  Expected time:  Means of arrival:  Comments: ems 

## 2015-08-20 NOTE — H&P (Signed)
[facility] History and Physical  Wesley [name] [id number] DOB: 09/08/1946 DOA: 08/20/2015  Referring physician: Emergency Department PCP: [name], MD  Specialists:   Chief Complaint: SOB  HPI: Wesley [name] is a 69 y.o. 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 On the day of admission, pt noted to have increasing sob and hypoxia with increased O2 requirements (up to 11L NRB) and temp of 100.70F. Pt subsequently presented to ED where CXR was notable for B diffuse airspace disease sparing the RUL that has progressed since prior xray. Patient was started on empiric vanc and zosyn and hospitalist consulted for consideration for admission.  Review of Systems:  Review of Systems  Constitutional: Positive for fever and malaise/fatigue.  HENT: Negative for ear pain and tinnitus.   Eyes: Negative for pain and discharge.  Respiratory: Positive for cough and shortness of breath.   Cardiovascular: Negative for claudication and leg swelling.  Gastrointestinal: Negative for abdominal pain and constipation.  Genitourinary: Negative for frequency and hematuria.  Musculoskeletal: Negative for joint pain and neck pain.  Neurological: Negative for tremors, seizures and loss of consciousness.  Psychiatric/Behavioral: Negative for memory loss. The patient is not nervous/anxious.      History reviewed. No pertinent past medical history. Past Surgical History  Procedure Laterality Date  . Knee arthroscopy  11/08/2002    right knee examination  . Knee arthroscopy w/ partial medial meniscectomy  11/08/2002  . Chondroplasty  11/08/2002    extensive, of the patellofemoral joint  . Chondroplasty  11/08/2002    lesser extent chondroplasty medial femoral condyle   Social History:  reports that he has never smoked. He  has never used smokeless tobacco. He reports that he does not drink alcohol or use illicit drugs.  where does patient live--home, ALF, SNF? and with whom if at home?  Can patient participate in ADLs?  No Known Allergies  Family History  Problem Relation Age of Onset  . Adopted: Yes  . Aneurysm Mother     deceased from brain aneurysm     Prior to Admission medications   Medication Sig Start Date End Date Taking? Authorizing Provider  albuterol (PROVENTIL HFA;VENTOLIN HFA) 108 (90 BASE) MCG/ACT inhaler Inhale 2 puffs into the lungs every 6 (six) hours as needed for wheezing or shortness of breath. 08/13/15  Yes Nishant Dhungel, MD  albuterol (PROVENTIL) (2.5 MG/3ML) 0.083% nebulizer solution Take 2.5 mg by nebulization every 6 (six) hours as needed for wheezing or shortness of breath.  07/18/14  Yes Historical Provider, MD  amLODipine (NORVASC) 10 MG tablet Take 10 mg by mouth every morning.    Yes Historical Provider, MD  aspirin EC 81 MG tablet Take 81 mg by mouth every morning.   Yes Historical Provider, MD  carboxymethylcellulose (REFRESH) 1 % ophthalmic solution Apply 1 drop to eye 3 (three) times daily.   Yes Historical Provider, MD  cholecalciferol (VITAMIN D) 1000 UNITS tablet Take 1,000 Units by mouth daily.    Yes Historical Provider, MD  docusate sodium (COLACE) 100 MG capsule Take 100 mg by mouth daily.   Yes Historical Provider, MD  gabapentin (NEURONTIN) 300 MG capsule Take 300 mg by mouth at bedtime.    Yes Historical Provider, MD  galantamine (RAZADYNE ER) 16 MG 24 hr capsule Take 16 mg by mouth at bedtime.  Yes Historical Provider, MD  levothyroxine (SYNTHROID, LEVOTHROID) 125 MCG tablet Take 125 mcg by mouth daily before breakfast.   Yes Historical Provider, MD  metoprolol (LOPRESSOR) 100 MG tablet Take 100 mg by mouth 2 (two) times daily.   Yes Historical Provider, MD  omeprazole (PRILOSEC) 20 MG capsule Take 20 mg by mouth 2 (two) times daily before a meal.    Yes  Historical Provider, MD  pravastatin (PRAVACHOL) 20 MG tablet Take 20 mg by mouth at bedtime.   Yes Historical Provider, MD  QUEtiapine (SEROQUEL) 100 MG tablet Take 100 mg by mouth at bedtime. Take with 25 mg tablet to equal a total dose of 125 mg   Yes Historical Provider, MD  QUEtiapine (SEROQUEL) 25 MG tablet Take 25 mg by mouth at bedtime. Take with 100mg  tablet to equal a total dose of 125 mg   Yes Historical Provider, MD  sertraline (ZOLOFT) 100 MG tablet Take 150 mg by mouth at bedtime.   Yes Historical Provider, MD  Tamsulosin HCl (FLOMAX) 0.4 MG CAPS Take 0.4 mg by mouth daily after breakfast. Once a day 11/14/10  Yes Historical Provider, MD   Physical Exam: Filed Vitals:   08/20/15 1500 08/20/15 1521 08/20/15 1615 08/20/15 1635  BP: 126/79   133/87  Pulse: 78 79  74  Temp:   98.9 F (37.2 C) 98.1 F (36.7 C)  TempSrc:    Oral  Resp: 21 24    Height:    5\' 9"  (1.753 m)  Weight:    131.1 kg (289 lb 0.4 oz)  SpO2: 91% 85%  92%     General:  Awake, in nad, conversant  Eyes: PERRL B  ENT: membranes moist, dentition fair  Neck: trachea midline, neck supple  Cardiovascular: regular, s1, s2  Respiratory: normal resp effort, distant BS, trace end-expiratory wheezing  Abdomen: soft, obese, nondistended, pos BS  Skin: normal skin turgor, no abnormal skin lesion   Musculoskeletal: perfused, no clubbing  Psychiatric: mood/affect normal// no auditory/visual hallucinations  Neurologic: cn2-12 grossly intact, strength/sensation intact  Labs on Admission:  Basic Metabolic Panel:  Recent Labs Lab 08/20/15 1147  NA 143  K 4.1  CL 101  CO2 33*  GLUCOSE 106*  BUN 57*  CREATININE 2.56*  CALCIUM 8.3*   Liver Function Tests:  Recent Labs Lab 08/20/15 1147  AST 11*  ALT 19  ALKPHOS 80  BILITOT 0.4  PROT 6.3*  ALBUMIN 2.9*   No results for input(s): LIPASE, AMYLASE in the last 168 hours. No results for input(s): AMMONIA in the last 168 hours. CBC:  Recent  Labs Lab 08/20/15 1147  WBC 23.7*  NEUTROABS 20.7*  HGB 9.3*  HCT 32.5*  MCV 85.1  PLT 275   Cardiac Enzymes: No results for input(s): CKTOTAL, CKMB, CKMBINDEX, TROPONINI in the last 168 hours.  BNP (last 3 results)  Recent Labs  04/18/15 1554 08/09/15 0746  BNP 496.3* 657.8*    ProBNP (last 3 results) No results for input(s): PROBNP in the last 8760 hours.  CBG: No results for input(s): GLUCAP in the last 168 hours.  Radiological Exams on Admission: Dg Chest 2 View  08/20/2015  CLINICAL DATA:  69 year old male with shortness of breath. Subsequent encounter. EXAM: CHEST  2 VIEW COMPARISON:  Several prior chest x-rays most recent 08/09/2015. FINDINGS: Bilateral diffuse airspace disease sparing the right upper lobe has progressed when compared to prior examination (most notable change left upper lobe). This may represent multi focal pneumonia but will need  to be followed until complete clearance to exclude underlying malignancy. Limited for detecting pulmonary edema given the diffuse airspace disease. Cardiomegaly. No pneumothorax. IMPRESSION: Bilateral diffuse airspace disease sparing the right upper lobe has progressed when compared to prior examination (most notable change left upper lobe). This may represent multi focal pneumonia but will need to be followed until complete clearance to exclude underlying malignancy. Limited for detecting pulmonary edema given the diffuse airspace disease. Cardiomegaly. Electronically Signed   By: Genia Del M.D.   On: 08/20/2015 12:46    Assessment/Plan Principal Problem:   Acute respiratory failure with hypoxia (HCC) Active Problems:   Essential hypertension   Oropharynx cancer (HCC)   CKD (chronic kidney disease) stage 4, GFR 15-29 ml/min (HCC)   Obesity hypoventilation syndrome (HCC)   HCAP (healthcare-associated pneumonia)   1. Acute respiratory failure with hypoxia 1. Pt currently requires up to [facility] in the setting of recurrent  HCAP (see below) 2. Cont pulm toilet and O2 as needed 3. Admit to stepdown 2. HCAP with sepsis 1. Patient febrile with leukocytosis (albeit pt has been taking steroids prior to admit) 2. Normal lactate 3. Recently admitted and discharged for HCAP 4. CXR confirms B diffuse airspace disease. Given recurrence of pneumonia, would obtain chest CT to eval for other underlying processes not seen on CXR  5. Will check pan-cultures 6. Will continue on empiric vancomycin and zosyn 3. HTN 1. BP stable at present 2. Cont to monitor 4. CKD stage 4 1. Monitor renal function closely 2. Stable at present 5. DVT prophylaxis 1. Heparin subQ 6. Hx Achalasia 1. Patient reports long hx of dysphagia that patient compensates by "taking a big gulp when I swallow" 2. For now, will continue on dysphagia 3 diet empirically and will consult SLP for assistance  Code Status: Full Family Communication: Pt in room Disposition Plan: Admit to stepdown   Masako Overall, [facility] Pager [phone]  If 7PM-7AM, please contact night-coverage [email or web address] Password [facility] 08/20/2015, 4:52 PM

## 2015-08-20 NOTE — Progress Notes (Signed)
ANTIBIOTIC CONSULT NOTE - INITIAL  Pharmacy Consult for Vancomycin and Zosyn Indication: HCAP  No Known Allergies  Patient Measurements:   As of 08/09/15: Height: 70 inches Weight: 128kg  Vital Signs: Temp: 100.6 F (38.1 C) (12/08 1041) Temp Source: Rectal (12/08 1041) BP: 104/52 mmHg (12/08 1127) Pulse Rate: 66 (12/08 1127) Intake/Output from previous day:   Intake/Output from this shift:    Labs:  Recent Labs  08/20/15 1147  WBC 23.7*  HGB 9.3*  PLT 275  CREATININE 2.56*   Estimated Creatinine Clearance: 36.2 mL/min (by C-G formula based on Cr of 2.56). No results for input(s): VANCOTROUGH, VANCOPEAK, VANCORANDOM, GENTTROUGH, GENTPEAK, GENTRANDOM, TOBRATROUGH, TOBRAPEAK, TOBRARND, AMIKACINPEAK, AMIKACINTROU, AMIKACIN in the last 72 hours.   Microbiology: Recent Results (from the past 720 hour(s))  Culture, blood (routine x 2)     Status: None   Collection Time: 08/09/15  8:20 AM  Result Value Ref Range Status   Specimen Description LEFT ANTECUBITAL  Final   Special Requests BOTTLES DRAWN AEROBIC AND ANAEROBIC 5CC  Final   Culture   Final    NO GROWTH 5 DAYS Performed at [facility]    Report Status 08/14/2015 FINAL  Final  Culture, blood (routine x 2)     Status: None   Collection Time: 08/09/15  8:25 AM  Result Value Ref Range Status   Specimen Description BLOOD BLOOD LEFT WRIST  Final   Special Requests BOTTLES DRAWN AEROBIC AND ANAEROBIC 5CC  Final   Culture   Final    NO GROWTH 5 DAYS Performed at [facility]    Report Status 08/14/2015 FINAL  Final  MRSA PCR Screening     Status: None   Collection Time: 08/09/15 10:00 AM  Result Value Ref Range Status   MRSA by PCR NEGATIVE NEGATIVE Final    Comment:        The GeneXpert MRSA Assay (FDA approved for NASAL specimens only), is one component of a comprehensive MRSA colonization surveillance program. It is not intended to diagnose MRSA infection nor to guide or monitor  treatment for MRSA infections.   Culture, sputum-assessment     Status: None   Collection Time: 08/09/15  6:51 PM  Result Value Ref Range Status   Specimen Description SPUTUM  Final   Special Requests NONE  Final   Sputum evaluation   Final    THIS SPECIMEN IS ACCEPTABLE. RESPIRATORY CULTURE REPORT TO FOLLOW.   Report Status 08/09/2015 FINAL  Final  Culture, respiratory (NON-Expectorated)     Status: None   Collection Time: 08/09/15  6:51 PM  Result Value Ref Range Status   Specimen Description SPUTUM  Final   Special Requests NONE  Final   Gram Stain   Final    ABUNDANT WBC PRESENT, PREDOMINANTLY PMN FEW SQUAMOUS EPITHELIAL CELLS PRESENT FEW GRAM POSITIVE RODS RARE GRAM NEGATIVE RODS RARE GRAM POSITIVE COCCI IN PAIRS    Culture   Final    NORMAL OROPHARYNGEAL FLORA Performed at [facility]    Report Status 08/12/2015 FINAL  Final    Medical History: History reviewed. No pertinent past medical history.  Medications:  Scheduled:   Infusions:  . piperacillin-tazobactam    . sodium chloride    . vancomycin     PRN:   Assessment: 69 year old male recently hospitalized 11/27-12/1 with CAP presents with with fever, SHoB and increased O2 requirements.  Pharmacy is now consulted to dose vancomycin and Zosyn for presumed HCAP.  Goal  of Therapy:  Vancomycin trough level 15-20 mcg/ml  Doses appropriate for indication and renal function  Plan:   Vancomycin 2g IV x 1, then 1500mg  IV q24h Check trough at steady state Zosyn 3.375gm IV q8h (4hr extended infusions) Follow up renal function & cultures, clinical course  Peggyann Juba, PharmD, BCPS Pager: [phone] 08/20/2015,12:44 PM

## 2015-08-20 NOTE — ED Notes (Signed)
Pt's wife reports that she gave him tylenol before he left the house approx 9:45 am.

## 2015-08-21 LAB — LEGIONELLA ANTIGEN, URINE

## 2015-08-21 LAB — CBC
HEMATOCRIT: 29.3 % — AB (ref 39.0–52.0)
Hemoglobin: 8.4 g/dL — ABNORMAL LOW (ref 13.0–17.0)
MCH: 24.1 pg — ABNORMAL LOW (ref 26.0–34.0)
MCHC: 28.7 g/dL — AB (ref 30.0–36.0)
MCV: 84 fL (ref 78.0–100.0)
Platelets: 255 10*3/uL (ref 150–400)
RBC: 3.49 MIL/uL — ABNORMAL LOW (ref 4.22–5.81)
RDW: 17 % — AB (ref 11.5–15.5)
WBC: 25.7 10*3/uL — ABNORMAL HIGH (ref 4.0–10.5)

## 2015-08-21 LAB — COMPREHENSIVE METABOLIC PANEL
ALBUMIN: 2.7 g/dL — AB (ref 3.5–5.0)
ALT: 18 U/L (ref 17–63)
ANION GAP: 7 (ref 5–15)
AST: 13 U/L — ABNORMAL LOW (ref 15–41)
Alkaline Phosphatase: 76 U/L (ref 38–126)
BILIRUBIN TOTAL: 0.5 mg/dL (ref 0.3–1.2)
BUN: 56 mg/dL — AB (ref 6–20)
CO2: 32 mmol/L (ref 22–32)
Calcium: 8.3 mg/dL — ABNORMAL LOW (ref 8.9–10.3)
Chloride: 103 mmol/L (ref 101–111)
Creatinine, Ser: 2.59 mg/dL — ABNORMAL HIGH (ref 0.61–1.24)
GFR calc Af Amer: 27 mL/min — ABNORMAL LOW (ref >60)
GFR calc non Af Amer: 23 mL/min — ABNORMAL LOW (ref >60)
GLUCOSE: 197 mg/dL — AB (ref 65–99)
POTASSIUM: 4 mmol/L (ref 3.5–5.1)
SODIUM: 142 mmol/L (ref 135–145)
TOTAL PROTEIN: 6.1 g/dL — AB (ref 6.5–8.1)

## 2015-08-21 LAB — URINE CULTURE: Culture: 2000

## 2015-08-21 LAB — HIV ANTIBODY (ROUTINE TESTING W REFLEX): HIV SCREEN 4TH GENERATION: NONREACTIVE

## 2015-08-21 MED ORDER — CALCIUM CARBONATE ANTACID 500 MG PO CHEW
1.0000 | CHEWABLE_TABLET | Freq: Four times a day (QID) | ORAL | Status: DC | PRN
  Administered 2015-08-21: 200 mg via ORAL
  Filled 2015-08-21: qty 1

## 2015-08-21 MED ORDER — QUETIAPINE FUMARATE 100 MG PO TABS
125.0000 mg | ORAL_TABLET | Freq: Every day | ORAL | Status: DC
  Administered 2015-08-21 – 2015-08-28 (×8): 125 mg via ORAL
  Filled 2015-08-21 (×9): qty 1

## 2015-08-21 NOTE — Progress Notes (Signed)
Inpatient Diabetes Program Recommendations  AACE/ADA: New Consensus Statement on Inpatient Glycemic Control (2015)  Target Ranges:  Prepandial:   less than 140 mg/dL      Peak postprandial:   less than 180 mg/dL (1-2 hours)      Critically ill patients:  140 - 180 mg/dL    Results for TYJAE, CHRISTOFFER (MRN [id number]) as of 08/21/2015 07:35  Ref. Range 08/21/2015 03:50  Glucose Latest Ref Range: 65-99 mg/dL 197 (H)     Admit with: Pneumonia/ Sepsis/ Hypoxia     MD- Note patient currently receiving IV Solumedrol 60 mg bid.  Lab glucose elevated to 197 mg/dl this AM.  Please consider checking CBGs while patient getting IV steroids and covering with Novolog SSI if elevated.      --Will follow patient during hospitalization--  Wyn Quaker RN, MSN, CDE Diabetes Coordinator Inpatient Glycemic Control Team Team Pager: [phone] (8a-5p)

## 2015-08-21 NOTE — Progress Notes (Signed)
Wesley [name]  JAMESROBERT [name] [id number] DOB: 1945-12-05 DOA: 08/20/2015 PCP: [name], MD  HPI/Brief narrative 69 y.o. 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 On the day of admission, pt noted to have increasing sob and hypoxia with increased O2 requirements (up to 11L NRB) and temp of 100.61F. Pt subsequently presented to ED where CXR was notable for B diffuse airspace disease sparing the RUL that has progressed since prior xray. Patient was started on empiric vanc and zosyn and hospitalist consulted for consideration for admission  Assessment/Plan: 1. Acute respiratory failure with hypoxia secondary to HCAP vs bronchitis 1. Pt initially required [facility] in the setting of recurrent HCAP (see below) 2. Cont pulm toilet and O2 as needed 3. O2 requirements improved to 4LNC this AM 4. Cont to wean O2 to baseline of 3LNC 5. Pt is continued on scheduled nebs and steroids. Will continue for now and wean as tolerated 2. HCAP with sepsis 1. Patient febrile with leukocytosis (albeit pt has been taking steroids prior to admit) 2. Normal lactate 3. Recently admitted and discharged for HCAP 4. CXR confirms B diffuse airspace disease. 5. Will continue on empiric vancomycin and zosyn 6. Patient is clinically improving nicely. If worsens, then would consider further imaging w/u 3. HTN 1. BP stable at present 2. Cont to monitor 4. CKD stage 4 1. Monitor renal function closely 2. Stable at present 5. DVT prophylaxis 1. Heparin subQ while admitted 6. Hx Achalasia 1. Patient reports long hx of dysphagia that patient compensates by "taking a big gulp when I swallow" 2. For now, will continue on dysphagia 3 diet empirically and will consult SLP for assistance  Code Status: Full Family Communication:  Pt in room Disposition Plan: Transfer to floor   Consultants:    Procedures:    Antibiotics: Anti-infectives    Start     Dose/Rate Route Frequency Ordered Stop   08/21/15 1600  vancomycin (VANCOCIN) 1,500 mg in sodium chloride 0.9 % 500 mL IVPB     1,500 mg 250 mL/hr over 120 Minutes Intravenous Every 24 hours 08/20/15 1305     08/20/15 2200  piperacillin-tazobactam (ZOSYN) IVPB 3.375 g     3.375 g 12.5 mL/hr over 240 Minutes Intravenous Every 8 hours 08/20/15 1305     08/20/15 1245  piperacillin-tazobactam (ZOSYN) IVPB 3.375 g     3.375 g 100 mL/hr over 30 Minutes Intravenous STAT 08/20/15 1238 08/20/15 1334   08/20/15 1245  vancomycin (VANCOCIN) 2,000 mg in sodium chloride 0.9 % 500 mL IVPB     2,000 mg 250 mL/hr over 120 Minutes Intravenous  Once 08/20/15 1240 08/20/15 1532      HPI/Subjective: Feels much better today. Inquiring about going home  Objective: Filed Vitals:   08/21/15 0700 08/21/15 0800 08/21/15 0900 08/21/15 1000  BP: 99/49 132/102 108/53 132/56  Pulse: 64 80 67 72  Temp:  98.4 F (36.9 C)    TempSrc:  Oral    Resp: 14 19 11 18   Height:      Weight:      SpO2: 96%  93%     Intake/Output Summary (Last 24 hours) at 08/21/15 1123 Last data filed at 08/21/15 0900  Gross per 24 hour  Intake    580 ml  Output   1900 ml  Net  -1320 ml  Filed Weights   08/20/15 1307 08/20/15 1635  Weight: 127.007 kg (280 lb) 131.1 kg (289 lb 0.4 oz)    Exam:   General:  Awake, in nad  Cardiovascular: regular, s1, s2  Respiratory: normal resp effort, trace end-expiratory wheezing at bases  Abdomen: soft, obese, nonidstended  Musculoskeletal: perfused, no clubbing   Data Reviewed: Basic Metabolic Panel:  Recent Labs Lab 08/20/15 1147 08/21/15 0350  NA 143 142  K 4.1 4.0  CL 101 103  CO2 33* 32  GLUCOSE 106* 197*  BUN 57* 56*  CREATININE 2.56* 2.59*  CALCIUM 8.3* 8.3*   Liver Function Tests:  Recent Labs Lab 08/20/15 1147  08/21/15 0350  AST 11* 13*  ALT 19 18  ALKPHOS 80 76  BILITOT 0.4 0.5  PROT 6.3* 6.1*  ALBUMIN 2.9* 2.7*   No results for input(s): LIPASE, AMYLASE in the last 168 hours. No results for input(s): AMMONIA in the last 168 hours. CBC:  Recent Labs Lab 08/20/15 1147 08/21/15 0350  WBC 23.7* 25.7*  NEUTROABS 20.7*  --   HGB 9.3* 8.4*  HCT 32.5* 29.3*  MCV 85.1 84.0  PLT 275 255   Cardiac Enzymes: No results for input(s): CKTOTAL, CKMB, CKMBINDEX, TROPONINI in the last 168 hours. BNP (last 3 results)  Recent Labs  04/18/15 1554 08/09/15 0746  BNP 496.3* 657.8*    ProBNP (last 3 results) No results for input(s): PROBNP in the last 8760 hours.  CBG: No results for input(s): GLUCAP in the last 168 hours.  Recent Results (from the past 240 hour(s))  Urine culture     Status: None (Preliminary result)   Collection Time: 08/20/15 11:22 AM  Result Value Ref Range Status   Specimen Description URINE, CLEAN CATCH  Final   Special Requests NONE  Final   Culture   Final    NO GROWTH < 24 HOURS Performed at [facility]    Report Status PENDING  Incomplete  MRSA PCR Screening     Status: None   Collection Time: 08/20/15  4:41 PM  Result Value Ref Range Status   MRSA by PCR NEGATIVE NEGATIVE Final    Comment:        The GeneXpert MRSA Assay (FDA approved for NASAL specimens only), is one component of a comprehensive MRSA colonization surveillance program. It is not intended to diagnose MRSA infection nor to guide or monitor treatment for MRSA infections.      Studies: Dg Chest 2 View  08/20/2015  CLINICAL DATA:  69 year old male with shortness of breath. Subsequent encounter. EXAM: CHEST  2 VIEW COMPARISON:  Several prior chest x-rays most recent 08/09/2015. FINDINGS: Bilateral diffuse airspace disease sparing the right upper lobe has progressed when compared to prior examination (most notable change left upper lobe). This may represent multi focal pneumonia  but will need to be followed until complete clearance to exclude underlying malignancy. Limited for detecting pulmonary edema given the diffuse airspace disease. Cardiomegaly. No pneumothorax. IMPRESSION: Bilateral diffuse airspace disease sparing the right upper lobe has progressed when compared to prior examination (most notable change left upper lobe). This may represent multi focal pneumonia but will need to be followed until complete clearance to exclude underlying malignancy. Limited for detecting pulmonary edema given the diffuse airspace disease. Cardiomegaly. Electronically Signed   By: Genia Del M.D.   On: 08/20/2015 12:46    Scheduled Meds: . amLODipine  10 mg Oral q morning - 10a  . aspirin EC  81 mg Oral  q morning - 10a  . docusate sodium  100 mg Oral Daily  . gabapentin  300 mg Oral QHS  . galantamine  8 mg Oral BID WC  . heparin  5,000 Units Subcutaneous 3 times per day  . ipratropium-albuterol  3 mL Nebulization QID  . levothyroxine  125 mcg Oral QAC breakfast  . methylPREDNISolone (SOLU-MEDROL) injection  60 mg Intravenous Q12H  . metoprolol  100 mg Oral BID  . pantoprazole  40 mg Oral Daily  . piperacillin-tazobactam (ZOSYN)  IV  3.375 g Intravenous Q8H  . polyvinyl alcohol  1 drop Both Eyes TID  . pravastatin  20 mg Oral QHS  . QUEtiapine  100 mg Oral QHS  . QUEtiapine  25 mg Oral QHS  . sertraline  150 mg Oral QHS  . sodium chloride  3 mL Intravenous Q12H  . tamsulosin  0.4 mg Oral Daily  . vancomycin  1,500 mg Intravenous Q24H   Continuous Infusions:   Principal Problem:   Acute respiratory failure with hypoxia (HCC) Active Problems:   Essential hypertension   Oropharynx cancer (HCC)   CKD (chronic kidney disease) stage 4, GFR 15-29 ml/min (HCC)   Obesity hypoventilation syndrome (HCC)   HCAP (healthcare-associated pneumonia)   CHIU, [facility] Pager [phone]. If 7PM-7AM, please contact night-coverage at [email or web address], password  [facility] 08/21/2015, 11:23 AM  LOS: 1 day

## 2015-08-22 LAB — BASIC METABOLIC PANEL
Anion gap: 6 (ref 5–15)
BUN: 50 mg/dL — AB (ref 6–20)
CHLORIDE: 103 mmol/L (ref 101–111)
CO2: 33 mmol/L — ABNORMAL HIGH (ref 22–32)
CREATININE: 2.66 mg/dL — AB (ref 0.61–1.24)
Calcium: 8.5 mg/dL — ABNORMAL LOW (ref 8.9–10.3)
GFR calc Af Amer: 26 mL/min — ABNORMAL LOW (ref >60)
GFR calc non Af Amer: 23 mL/min — ABNORMAL LOW (ref >60)
Glucose, Bld: 167 mg/dL — ABNORMAL HIGH (ref 65–99)
Potassium: 4.8 mmol/L (ref 3.5–5.1)
SODIUM: 142 mmol/L (ref 135–145)

## 2015-08-22 LAB — CBC
HEMATOCRIT: 29.6 % — AB (ref 39.0–52.0)
HEMOGLOBIN: 8.5 g/dL — AB (ref 13.0–17.0)
MCH: 24 pg — ABNORMAL LOW (ref 26.0–34.0)
MCHC: 28.7 g/dL — ABNORMAL LOW (ref 30.0–36.0)
MCV: 83.6 fL (ref 78.0–100.0)
Platelets: 269 10*3/uL (ref 150–400)
RBC: 3.54 MIL/uL — ABNORMAL LOW (ref 4.22–5.81)
RDW: 17.4 % — AB (ref 11.5–15.5)
WBC: 22.6 10*3/uL — AB (ref 4.0–10.5)

## 2015-08-22 MED ORDER — RESOURCE THICKENUP CLEAR PO POWD
ORAL | Status: DC | PRN
  Filled 2015-08-22: qty 125

## 2015-08-22 MED ORDER — METHYLPREDNISOLONE SODIUM SUCC 125 MG IJ SOLR
60.0000 mg | Freq: Three times a day (TID) | INTRAMUSCULAR | Status: DC
  Administered 2015-08-22 – 2015-08-23 (×3): 60 mg via INTRAVENOUS
  Filled 2015-08-22 (×6): qty 0.96

## 2015-08-22 NOTE — Progress Notes (Signed)
[facility] PROGRESS NOTE  Wesley [name] [id number] DOB: [birth year] DOA: 08/20/2015 PCP: [name], MD  HPI/Brief narrative 69 y.o. 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 On the day of admission, pt noted to have increasing sob and hypoxia with increased O2 requirements (up to 11L NRB) and temp of 100.64F. Pt subsequently presented to ED where CXR was notable for B diffuse airspace disease sparing the RUL that has progressed since prior xray. Patient was started on empiric vanc and zosyn and hospitalist consulted for consideration for admission  Assessment/Plan: 1. Acute respiratory failure with hypoxia secondary to HCAP vs bronchitis 1. On presentation, pt initially required [facility] in the setting of recurrent HCAP (see below) 2. Cont pulm toilet and O2 as needed 3. O2 requirements have improved to 4LNC this AM 4. Cont to wean O2 to baseline of 3LNC 5. Pt is continued on scheduled nebs and steroids. Pt still wheezing, thus will increase solumedrol to 60mg  Q8hrs 2. HCAP vs aspiration PNA with sepsis 1. Patient febrile with leukocytosis (albeit pt has been taking steroids prior to admit) 2. Normal lactate 3. Recently admitted and discharged for HCAP 4. CXR confirms B diffuse airspace disease. 5. Will continue on empiric vancomycin and zosyn 6. Patient is clinically improving nicely. If worsens, then would consider further imaging w/u 7. When transitioning to PO, consider augmentin given aspiration risk (see below) 3. HTN 1. BP remains stable at present 2. Cont to monitor 4. CKD stage 4 1. Monitor renal function closely 2. Stable at present 5. DVT prophylaxis 1. Heparin subQ while admitted 6. Hx Dysphagia 1. Patient reports long hx of dysphagia that patient compensates by "taking a big gulp when I  swallow" 2. Patient was empirically continued on dysphagia 3 diet, agreed upon by SLP 3. Family had expressed concerns that patient normally eats regular diet prior to admit and wishes to have regular diet ordered in hospital despite full understanding of SLP recs. I have explained to pt and family concerns of aspiration risk and therefore would NOT deviate from dysphagia diet currently ordered. Family later states regular food would be brought in from home despite our recommendations. Pt and family are aware of risks of doing so.  Code Status: Full Family Communication: Pt in room Disposition Plan:  Home when on home O2 requirements   Consultants:    Procedures:    Antibiotics: Anti-infectives    Start     Dose/Rate Route Frequency Ordered Stop   08/21/15 1600  vancomycin (VANCOCIN) 1,500 mg in sodium chloride 0.9 % 500 mL IVPB     1,500 mg 250 mL/hr over 120 Minutes Intravenous Every 24 hours 08/20/15 1305     08/20/15 2200  piperacillin-tazobactam (ZOSYN) IVPB 3.375 g     3.375 g 12.5 mL/hr over 240 Minutes Intravenous Every 8 hours 08/20/15 1305     08/20/15 1245  piperacillin-tazobactam (ZOSYN) IVPB 3.375 g     3.375 g 100 mL/hr over 30 Minutes Intravenous STAT 08/20/15 1238 08/20/15 1334   08/20/15 1245  vancomycin (VANCOCIN) 2,000 mg in sodium chloride 0.9 % 500 mL IVPB     2,000 mg 250 mL/hr over 120 Minutes Intravenous  Once 08/20/15 1240 08/20/15 1532      HPI/Subjective: Complains of wheezing this AM  Objective: Filed Vitals:   08/21/15 2237 08/22/15 0515 08/22/15 0755 08/22/15  1145  BP: 144/68 142/77    Pulse: 73 60    Temp: 98.2 F (36.8 C) 97.6 F (36.4 C)    TempSrc: Oral Oral    Resp: 18 18    Height:      Weight:      SpO2: 92% 96% 96% 94%    Intake/Output Summary (Last 24 hours) at 08/22/15 1404 Last data filed at 08/22/15 0900  Gross per 24 hour  Intake    240 ml  Output   1550 ml  Net  -1310 ml   Filed Weights   08/20/15 1307 08/20/15  1635  Weight: 127.007 kg (280 lb) 131.1 kg (289 lb 0.4 oz)    Exam:   General:  Awake, laying in bed, in nad  Cardiovascular: regular, s1, s2  Respiratory: normal resp effort, mild end-expiratory wheezing at bases  Abdomen: soft, obese, nonidstended  Musculoskeletal: perfused, no clubbing   Data Reviewed: Basic Metabolic Panel:  Recent Labs Lab 08/20/15 1147 08/21/15 0350 08/22/15 0511  NA 143 142 142  K 4.1 4.0 4.8  CL 101 103 103  CO2 33* 32 33*  GLUCOSE 106* 197* 167*  BUN 57* 56* 50*  CREATININE 2.56* 2.59* 2.66*  CALCIUM 8.3* 8.3* 8.5*   Liver Function Tests:  Recent Labs Lab 08/20/15 1147 08/21/15 0350  AST 11* 13*  ALT 19 18  ALKPHOS 80 76  BILITOT 0.4 0.5  PROT 6.3* 6.1*  ALBUMIN 2.9* 2.7*   No results for input(s): LIPASE, AMYLASE in the last 168 hours. No results for input(s): AMMONIA in the last 168 hours. CBC:  Recent Labs Lab 08/20/15 1147 08/21/15 0350 08/22/15 0511  WBC 23.7* 25.7* 22.6*  NEUTROABS 20.7*  --   --   HGB 9.3* 8.4* 8.5*  HCT 32.5* 29.3* 29.6*  MCV 85.1 84.0 83.6  PLT 275 255 269   Cardiac Enzymes: No results for input(s): CKTOTAL, CKMB, CKMBINDEX, TROPONINI in the last 168 hours. BNP (last 3 results)  Recent Labs  04/18/15 1554 08/09/15 0746  BNP 496.3* 657.8*    ProBNP (last 3 results) No results for input(s): PROBNP in the last 8760 hours.  CBG: No results for input(s): GLUCAP in the last 168 hours.  Recent Results (from the past 240 hour(s))  Urine culture     Status: None   Collection Time: 08/20/15 11:22 AM  Result Value Ref Range Status   Specimen Description URINE, CLEAN CATCH  Final   Special Requests NONE  Final   Culture   Final    2,000 COLONIES/mL INSIGNIFICANT GROWTH Performed at [facility]    Report Status 08/21/2015 FINAL  Final  Culture, blood (routine x 2)     Status: None (Preliminary result)   Collection Time: 08/20/15 11:45 AM  Result Value Ref Range Status    Specimen Description BLOOD RIGHT HAND  Final   Special Requests BOTTLES DRAWN AEROBIC AND ANAEROBIC 5ML  Final   Culture   Final    NO GROWTH 2 DAYS Performed at [facility]    Report Status PENDING  Incomplete  Culture, blood (routine x 2)     Status: None (Preliminary result)   Collection Time: 08/20/15 11:55 AM  Result Value Ref Range Status   Specimen Description BLOOD LEFT HAND  Final   Special Requests BOTTLES DRAWN AEROBIC AND ANAEROBIC 5ML  Final   Culture   Final    NO GROWTH 2 DAYS Performed at [facility]    Report Status  PENDING  Incomplete  MRSA PCR Screening     Status: None   Collection Time: 08/20/15  4:41 PM  Result Value Ref Range Status   MRSA by PCR NEGATIVE NEGATIVE Final    Comment:        The GeneXpert MRSA Assay (FDA approved for NASAL specimens only), is one component of a comprehensive MRSA colonization surveillance program. It is not intended to diagnose MRSA infection nor to guide or monitor treatment for MRSA infections.      Studies: No results found.  Scheduled Meds: . amLODipine  10 mg Oral q morning - 10a  . aspirin EC  81 mg Oral q morning - 10a  . docusate sodium  100 mg Oral Daily  . gabapentin  300 mg Oral QHS  . galantamine  8 mg Oral BID WC  . heparin  5,000 Units Subcutaneous 3 times per day  . ipratropium-albuterol  3 mL Nebulization QID  . levothyroxine  125 mcg Oral QAC breakfast  . methylPREDNISolone (SOLU-MEDROL) injection  60 mg Intravenous Q8H  . metoprolol  100 mg Oral BID  . pantoprazole  40 mg Oral Daily  . piperacillin-tazobactam (ZOSYN)  IV  3.375 g Intravenous Q8H  . polyvinyl alcohol  1 drop Both Eyes TID  . pravastatin  20 mg Oral QHS  . QUEtiapine  125 mg Oral QHS  . sertraline  150 mg Oral QHS  . sodium chloride  3 mL Intravenous Q12H  . tamsulosin  0.4 mg Oral Daily  . vancomycin  1,500 mg Intravenous Q24H   Continuous Infusions:   Principal Problem:   Acute respiratory failure with  hypoxia (HCC) Active Problems:   Essential hypertension   Oropharynx cancer (HCC)   CKD (chronic kidney disease) stage 4, GFR 15-29 ml/min (HCC)   Obesity hypoventilation syndrome (HCC)   HCAP (healthcare-associated pneumonia)   Tashona Calk, [place] [facility] Pager [phone]. If 7PM-7AM, please contact night-coverage at [email or web address], password [facility] 08/22/2015, 2:04 PM  LOS: 2 days

## 2015-08-22 NOTE — Evaluation (Signed)
Clinical/Bedside Swallow Evaluation Patient Details  Name: Wesley [name] MRN: [id number] Date of Birth: 1945-10-22  Today's Date: 08/22/2015 Time: SLP Start Time (ACUTE ONLY): 0902 SLP Stop Time (ACUTE ONLY): 0928 SLP Time Calculation (min) (ACUTE ONLY): 26 min  Past Medical History: History reviewed. No pertinent past medical history. Past Surgical History:  Past Surgical History  Procedure Laterality Date  . Knee arthroscopy  11/08/2002    right knee examination  . Knee arthroscopy w/ partial medial meniscectomy  11/08/2002  . Chondroplasty  11/08/2002    extensive, of the patellofemoral joint  . Chondroplasty  11/08/2002    lesser extent chondroplasty medial femoral condyle   HPI:  69 y.o. male with past medical history significant for chronic O2 dependence for obesity hypoventilation syndrome re-admitted with hypoxia.  Recent admission (11/27-12/1) for HCAP.  Hx HTN, oropharyngeal cancer, s/p radiation tx.  Hx aspiration, esophageal surgery, achalasia.     Assessment / Plan / Recommendation Clinical Impression  Pt describes a longstanding h/o oropharyngeal and esophageal dysphagia, for which he compensates by "gulping" and tucking his chin. Strong, prolonged cough response is noted following small cup sips of water, which is reduced with use of chin tuck. However, frequent throat clearing persists and is concerning for airway compromise. SLP intervention for use of nectar thick liquids effectively eliminates overt signs of aspiration. Solids are consumed without overt difficulty as well. Pt subjectively reports improved function with thickened liquids, but is initially hesitant to try them as he "does not want to do the extra step". SLP provided education about the risks of aspiration, including repeated PNAs. Pt is agreeable to try thickened liquids while in house, as the drinks will already come pre-thickened from the [facility]. Will adjust diet to Dys 3 textures and nectar thick liquids.  Would continue use of chin tuck. SLP to f/u for tolerance.    Aspiration Risk  Mild aspiration risk    Diet Recommendation  Dys 3, nectar thick liquids Chin tuck with liquids   Medication Administration: Whole meds with puree    Other  Recommendations Oral Care Recommendations: Oral care BID Other Recommendations: Order thickener from pharmacy;Prohibited food (jello, ice cream, thin soups);Remove water pitcher   Follow up Recommendations  Home health SLP    Frequency and Duration min 2x/week  2 weeks       Prognosis Prognosis for Safe Diet Advancement: Fair Barriers to Reach Goals: Time post onset      Swallow Study   General HPI: 69 y.o. male with past medical history significant for chronic O2 dependence for obesity hypoventilation syndrome re-admitted with hypoxia.  Recent admission (11/27-12/1) for HCAP.  Hx HTN, oropharyngeal cancer, s/p radiation tx.  Hx aspiration, esophageal surgery, achalasia.   Type of Study: Bedside Swallow Evaluation Previous Swallow Assessment: none in chart Diet Prior to this Study: Dysphagia 3 (soft);Thin liquids Temperature Spikes Noted: No Respiratory Status: Nasal cannula History of Recent Intubation: No Behavior/Cognition: Alert;Cooperative;Pleasant mood;Requires cueing;Distractible Oral Cavity Assessment: Within Functional Limits Oral Care Completed by SLP: No Vision: Functional for self-feeding Self-Feeding Abilities: Able to feed self Patient Positioning: Upright in bed Baseline Vocal Quality: Normal Volitional Swallow: Able to elicit    Oral/Motor/Sensory Function Overall Oral Motor/Sensory Function: Within functional limits   Ice Chips Ice chips: Not tested   Thin Liquid Thin Liquid: Impaired Presentation: Cup;Self Fed;Straw Pharyngeal  Phase Impairments: Suspected delayed Swallow;Cough - Immediate;Wet Vocal Quality    Nectar Thick Nectar Thick Liquid: Impaired Presentation: Self Fed;Straw Pharyngeal Phase Impairments:  Suspected  delayed Swallow   Honey Thick Honey Thick Liquid: Not tested   Puree Puree: Within functional limits Presentation: Self Fed;Spoon   Solid Solid: Within functional limits Presentation: Self Fed      Germain Osgood, M.A. CCC-SLP [phone]  Germain Osgood 08/22/2015,9:39 AM

## 2015-08-23 LAB — CBC
HCT: 30.9 % — ABNORMAL LOW (ref 39.0–52.0)
HEMOGLOBIN: 8.7 g/dL — AB (ref 13.0–17.0)
MCH: 23.8 pg — AB (ref 26.0–34.0)
MCHC: 28.2 g/dL — AB (ref 30.0–36.0)
MCV: 84.4 fL (ref 78.0–100.0)
Platelets: 298 10*3/uL (ref 150–400)
RBC: 3.66 MIL/uL — ABNORMAL LOW (ref 4.22–5.81)
RDW: 17.4 % — AB (ref 11.5–15.5)
WBC: 19.5 10*3/uL — ABNORMAL HIGH (ref 4.0–10.5)

## 2015-08-23 LAB — BASIC METABOLIC PANEL
Anion gap: 7 (ref 5–15)
BUN: 56 mg/dL — AB (ref 6–20)
CALCIUM: 8.6 mg/dL — AB (ref 8.9–10.3)
CO2: 31 mmol/L (ref 22–32)
CREATININE: 2.59 mg/dL — AB (ref 0.61–1.24)
Chloride: 105 mmol/L (ref 101–111)
GFR calc Af Amer: 27 mL/min — ABNORMAL LOW (ref >60)
GFR calc non Af Amer: 23 mL/min — ABNORMAL LOW (ref >60)
GLUCOSE: 162 mg/dL — AB (ref 65–99)
Potassium: 4.5 mmol/L (ref 3.5–5.1)
Sodium: 143 mmol/L (ref 135–145)

## 2015-08-23 MED ORDER — AMOXICILLIN-POT CLAVULANATE 875-125 MG PO TABS
1.0000 | ORAL_TABLET | Freq: Two times a day (BID) | ORAL | Status: DC
  Administered 2015-08-23 – 2015-08-24 (×4): 1 via ORAL
  Filled 2015-08-23 (×6): qty 1

## 2015-08-23 MED ORDER — METHYLPREDNISOLONE SODIUM SUCC 125 MG IJ SOLR
60.0000 mg | Freq: Two times a day (BID) | INTRAMUSCULAR | Status: DC
  Administered 2015-08-23 – 2015-08-25 (×4): 60 mg via INTRAVENOUS
  Filled 2015-08-23 (×6): qty 0.96

## 2015-08-23 NOTE — Progress Notes (Signed)
Pharmacy Antibiotic Follow-up Note  Wesley [name] is a 69 y.o. year-old male admitted on 08/20/2015.  The patient is currently on day 4 of Vancomycin and Zosyn for HCAP.  Assessment/Plan:  Check vancomycin trough before 4th dose today at 15:30  Continue Zosyn 3.375gm IV q8h (4hr extended infusions)  Follow up transition to PO antibiotics  Temp (24hrs), Avg:98.1 F (36.7 C), Min:97.5 F (36.4 C), Max:98.5 F (36.9 C)   Recent Labs Lab 08/20/15 1147 08/21/15 0350 08/22/15 0511 08/23/15 0531  WBC 23.7* 25.7* 22.6* 19.5*    Recent Labs Lab 08/20/15 1147 08/21/15 0350 08/22/15 0511 08/23/15 0531  CREATININE 2.56* 2.59* 2.66* 2.59*   Estimated Creatinine Clearance: 35.6 mL/min (by C-G formula based on Cr of 2.59).    No Known Allergies  Antimicrobials this admission: 12/8 >> Vanc >> 12/8 >> Zosyn >>  Levels/dose changes this admission: 12/11 VT at 15:30 = ___ before 4th total dose  Microbiology Results: 12/8 blood x 2: ngtd 12/8 urine: 2k insignificant growth 12/8 Strep pneumo/Legionella: neg for both 12/9 HIV Ab: neg  Thank you for allowing pharmacy to be a part of this patient's care.  Peggyann Juba, PharmD, BCPS Pager: [phone]  08/23/2015 11:38 AM

## 2015-08-23 NOTE — Progress Notes (Addendum)
[facility] PROGRESS NOTE  Wesley [name] [id number] DOB: 02/04/1946 DOA: 08/20/2015 PCP: [name], MD  HPI/Brief narrative 69 y.o. 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 On the day of admission, pt noted to have increasing sob and hypoxia with increased O2 requirements (up to 11L NRB) and temp of 100.80F. Pt subsequently presented to ED where CXR was notable for B diffuse airspace disease sparing the RUL that has progressed since prior xray. Patient was started on empiric vanc and zosyn and hospitalist consulted for consideration for admission  Assessment/Plan: 1. Acute respiratory failure with hypoxia secondary to pneumonia vs bronchitis 1. On presentation, pt initially required [facility] in the setting of recurrent HCAP vs aspiration PNA (see below) 2. Cont pulm toilet and O2 as needed 3. O2 requirements have improved to baseline 3LNC this AM 4. Pt is showing clinical improvement 5. Will decrease solumedrol to q12hr dosing 2. HCAP vs aspiration PNA with sepsis 1. Patient presented febrile with leukocytosis (albeit pt has been taking steroids prior to admit) 2. Normal lactate 3. Recently admitted and discharged for HCAP 4. CXR confirms B diffuse airspace disease. 5. Initially continued on empiric vancomycin and zosyn 6. Patient is clinically improving. 7. Will begin to narrow abx coverage. Given concerns of aspiration, will transition to PO augmentin today 8. Monitor 3. HTN 1. BP remains stable at present 2. Cont to monitor 4. CKD stage 4 1. Monitor renal function closely 2. Stable at present 5. DVT prophylaxis 1. Heparin subQ while admitted 6. Hx Dysphagia 1. Patient reports long hx of dysphagia that patient compensates by "taking a big gulp when I swallow" 2. Patient was empirically  continued on dysphagia 3 diet, agreed upon by SLP 3. On further questioning, patient describes what seems to be episodes of aspiration of regular food prior to admission. See above  Code Status: Full Family Communication: Pt in room Disposition Plan:  Home when on home O2 requirements and on minimal steroid dose, possible in 24-48hrs   Consultants:    Procedures:    Antibiotics: Anti-infectives    Start     Dose/Rate Route Frequency Ordered Stop   08/23/15 1230  amoxicillin-clavulanate (AUGMENTIN) 875-125 MG per tablet 1 tablet     1 tablet Oral Every 12 hours 08/23/15 1215     08/21/15 1600  vancomycin (VANCOCIN) 1,500 mg in sodium chloride 0.9 % 500 mL IVPB  Status:  Discontinued     1,500 mg 250 mL/hr over 120 Minutes Intravenous Every 24 hours 08/20/15 1305 08/23/15 1215   08/20/15 2200  piperacillin-tazobactam (ZOSYN) IVPB 3.375 g  Status:  Discontinued     3.375 g 12.5 mL/hr over 240 Minutes Intravenous Every 8 hours 08/20/15 1305 08/23/15 1215   08/20/15 1245  piperacillin-tazobactam (ZOSYN) IVPB 3.375 g     3.375 g 100 mL/hr over 30 Minutes Intravenous STAT 08/20/15 1238 08/20/15 1334   08/20/15 1245  vancomycin (VANCOCIN) 2,000 mg in sodium chloride 0.9 % 500 mL IVPB     2,000 mg 250 mL/hr over 120 Minutes Intravenous  Once 08/20/15 1240 08/20/15 1532      HPI/Subjective: Reports feeling better this AM  Objective: Filed Vitals:   08/22/15 2017 08/22/15 2115 08/23/15 0100 08/23/15 0504  BP:  171/77 162/73 154/80  Pulse:  84  60  Temp:  98.3 F (36.8 C)  97.5  F (36.4 C)  TempSrc:  Oral  Oral  Resp:  19  18  Height:      Weight:      SpO2: 93% 92%  93%    Intake/Output Summary (Last 24 hours) at 08/23/15 1215 Last data filed at 08/23/15 1021  Gross per 24 hour  Intake   1080 ml  Output   1450 ml  Net   -370 ml   Filed Weights   08/20/15 1307 08/20/15 1635  Weight: 127.007 kg (280 lb) 131.1 kg (289 lb 0.4 oz)    Exam:   General:  Awake,  laying in bed, in nad watching TV  Cardiovascular: regular, s1, s2  Respiratory: normal resp effort, mild end-expiratory wheezing at bases  Abdomen: soft, obese, nonidstended, pos BS  Musculoskeletal: perfused, no clubbing   Data Reviewed: Basic Metabolic Panel:  Recent Labs Lab 08/20/15 1147 08/21/15 0350 08/22/15 0511 08/23/15 0531  NA 143 142 142 143  K 4.1 4.0 4.8 4.5  CL 101 103 103 105  CO2 33* 32 33* 31  GLUCOSE 106* 197* 167* 162*  BUN 57* 56* 50* 56*  CREATININE 2.56* 2.59* 2.66* 2.59*  CALCIUM 8.3* 8.3* 8.5* 8.6*   Liver Function Tests:  Recent Labs Lab 08/20/15 1147 08/21/15 0350  AST 11* 13*  ALT 19 18  ALKPHOS 80 76  BILITOT 0.4 0.5  PROT 6.3* 6.1*  ALBUMIN 2.9* 2.7*   No results for input(s): LIPASE, AMYLASE in the last 168 hours. No results for input(s): AMMONIA in the last 168 hours. CBC:  Recent Labs Lab 08/20/15 1147 08/21/15 0350 08/22/15 0511 08/23/15 0531  WBC 23.7* 25.7* 22.6* 19.5*  NEUTROABS 20.7*  --   --   --   HGB 9.3* 8.4* 8.5* 8.7*  HCT 32.5* 29.3* 29.6* 30.9*  MCV 85.1 84.0 83.6 84.4  PLT 275 255 269 298   Cardiac Enzymes: No results for input(s): CKTOTAL, CKMB, CKMBINDEX, TROPONINI in the last 168 hours. BNP (last 3 results)  Recent Labs  04/18/15 1554 08/09/15 0746  BNP 496.3* 657.8*    ProBNP (last 3 results) No results for input(s): PROBNP in the last 8760 hours.  CBG: No results for input(s): GLUCAP in the last 168 hours.  Recent Results (from the past 240 hour(s))  Urine culture     Status: None   Collection Time: 08/20/15 11:22 AM  Result Value Ref Range Status   Specimen Description URINE, CLEAN CATCH  Final   Special Requests NONE  Final   Culture   Final    2,000 COLONIES/mL INSIGNIFICANT GROWTH Performed at [facility]    Report Status 08/21/2015 FINAL  Final  Culture, blood (routine x 2)     Status: None (Preliminary result)   Collection Time: 08/20/15 11:45 AM  Result Value Ref  Range Status   Specimen Description BLOOD RIGHT HAND  Final   Special Requests BOTTLES DRAWN AEROBIC AND ANAEROBIC 5ML  Final   Culture   Final    NO GROWTH 3 DAYS Performed at [facility]    Report Status PENDING  Incomplete  Culture, blood (routine x 2)     Status: None (Preliminary result)   Collection Time: 08/20/15 11:55 AM  Result Value Ref Range Status   Specimen Description BLOOD LEFT HAND  Final   Special Requests BOTTLES DRAWN AEROBIC AND ANAEROBIC 5ML  Final   Culture   Final    NO GROWTH 3 DAYS Performed at [facility]  Report Status PENDING  Incomplete  MRSA PCR Screening     Status: None   Collection Time: 08/20/15  4:41 PM  Result Value Ref Range Status   MRSA by PCR NEGATIVE NEGATIVE Final    Comment:        The GeneXpert MRSA Assay (FDA approved for NASAL specimens only), is one component of a comprehensive MRSA colonization surveillance program. It is not intended to diagnose MRSA infection nor to guide or monitor treatment for MRSA infections.      Studies: No results found.  Scheduled Meds: . amLODipine  10 mg Oral q morning - 10a  . amoxicillin-clavulanate  1 tablet Oral Q12H  . aspirin EC  81 mg Oral q morning - 10a  . docusate sodium  100 mg Oral Daily  . gabapentin  300 mg Oral QHS  . galantamine  8 mg Oral BID WC  . heparin  5,000 Units Subcutaneous 3 times per day  . ipratropium-albuterol  3 mL Nebulization QID  . levothyroxine  125 mcg Oral QAC breakfast  . methylPREDNISolone (SOLU-MEDROL) injection  60 mg Intravenous Q12H  . metoprolol  100 mg Oral BID  . pantoprazole  40 mg Oral Daily  . polyvinyl alcohol  1 drop Both Eyes TID  . pravastatin  20 mg Oral QHS  . QUEtiapine  125 mg Oral QHS  . sertraline  150 mg Oral QHS  . sodium chloride  3 mL Intravenous Q12H  . tamsulosin  0.4 mg Oral Daily   Continuous Infusions:   Principal Problem:   Acute respiratory failure with hypoxia (HCC) Active Problems:    Essential hypertension   Oropharynx cancer (HCC)   CKD (chronic kidney disease) stage 4, GFR 15-29 ml/min (HCC)   Obesity hypoventilation syndrome (HCC)   HCAP (healthcare-associated pneumonia)   [facility], [place] [facility] Pager [phone]. If 7PM-7AM, please contact night-coverage at [email or web address], password [facility] 08/23/2015, 12:15 PM  LOS: 3 days

## 2015-08-24 LAB — CBC
HEMATOCRIT: 30 % — AB (ref 39.0–52.0)
HEMOGLOBIN: 8.9 g/dL — AB (ref 13.0–17.0)
MCH: 24.9 pg — ABNORMAL LOW (ref 26.0–34.0)
MCHC: 29.7 g/dL — ABNORMAL LOW (ref 30.0–36.0)
MCV: 83.8 fL (ref 78.0–100.0)
Platelets: 266 10*3/uL (ref 150–400)
RBC: 3.58 MIL/uL — AB (ref 4.22–5.81)
RDW: 17.4 % — AB (ref 11.5–15.5)
WBC: 14.2 10*3/uL — AB (ref 4.0–10.5)

## 2015-08-24 LAB — BASIC METABOLIC PANEL
ANION GAP: 8 (ref 5–15)
BUN: 62 mg/dL — AB (ref 6–20)
CHLORIDE: 102 mmol/L (ref 101–111)
CO2: 32 mmol/L (ref 22–32)
Calcium: 8.6 mg/dL — ABNORMAL LOW (ref 8.9–10.3)
Creatinine, Ser: 2.58 mg/dL — ABNORMAL HIGH (ref 0.61–1.24)
GFR calc Af Amer: 27 mL/min — ABNORMAL LOW (ref >60)
GFR, EST NON AFRICAN AMERICAN: 24 mL/min — AB (ref >60)
Glucose, Bld: 139 mg/dL — ABNORMAL HIGH (ref 65–99)
POTASSIUM: 4.7 mmol/L (ref 3.5–5.1)
SODIUM: 142 mmol/L (ref 135–145)

## 2015-08-24 MED ORDER — HYDRALAZINE HCL 20 MG/ML IJ SOLN
2.0000 mg | Freq: Once | INTRAMUSCULAR | Status: AC
  Administered 2015-08-24: 2 mg via INTRAVENOUS
  Filled 2015-08-24: qty 1

## 2015-08-24 NOTE — Progress Notes (Signed)
[facility] PROGRESS NOTE  Wesley [name] [id number] DOB: [birth year] DOA: 08/20/2015 PCP: [name], MD  HPI/Brief narrative 69 y.o. 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 On the day of admission, pt noted to have increasing sob and hypoxia with increased O2 requirements (up to 11L NRB) and temp of 100.39F. Pt subsequently presented to ED where CXR was notable for B diffuse airspace disease sparing the RUL that has progressed since prior xray. Patient was started on empiric vanc and zosyn and hospitalist consulted for consideration for admission  Assessment/Plan: 1. Acute respiratory failure with hypoxia secondary to pneumonia vs bronchitis 1. On presentation, pt initially required [facility] in the setting of recurrent HCAP vs aspiration PNA (see below) 2. Cont pulm toilet and O2 as needed 3. O2 requirements have improved to baseline 3LNC 4. Pt is showing clinical improvement 5. Patient is still actively wheezing 6. Plan to continue BID IV solumedrol today and consider further wean by tomorrow 2. HCAP vs aspiration PNA with sepsis 1. Patient presented febrile with leukocytosis (albeit pt has been taking steroids prior to admit) 2. Normal lactate 3. Recently admitted and discharged for HCAP 4. CXR confirms B diffuse airspace disease. 5. Initially continued on empiric vancomycin and zosyn 6. Patient is clinically improving. 7. Given concerns of aspiration, transitioned to PO augmentin 8. WBC continuing to improve 3. HTN 1. BP remains stable at present 2. Cont to monitor 4. CKD stage 4 1. Monitor renal function closely 2. Stable at present 5. DVT prophylaxis 1. Heparin subQ while admitted 6. Hx Dysphagia 1. Patient reports long hx of dysphagia that patient compensates by "taking a big gulp when I  swallow" 2. Patient was empirically continued on dysphagia 3 diet, agreed upon by SLP 3. On further questioning, patient describes what seems to be episodes of aspiration of regular food prior to admission. See above  Code Status: Full Family Communication: Pt in room Disposition Plan:  Home when on home O2 requirements and on minimal steroid dose, possible in 24hrs   Consultants:    Procedures:    Antibiotics: Anti-infectives    Start     Dose/Rate Route Frequency Ordered Stop   08/23/15 1300  amoxicillin-clavulanate (AUGMENTIN) 875-125 MG per tablet 1 tablet     1 tablet Oral Every 12 hours 08/23/15 1215     08/21/15 1600  vancomycin (VANCOCIN) 1,500 mg in sodium chloride 0.9 % 500 mL IVPB  Status:  Discontinued     1,500 mg 250 mL/hr over 120 Minutes Intravenous Every 24 hours 08/20/15 1305 08/23/15 1215   08/20/15 2200  piperacillin-tazobactam (ZOSYN) IVPB 3.375 g  Status:  Discontinued     3.375 g 12.5 mL/hr over 240 Minutes Intravenous Every 8 hours 08/20/15 1305 08/23/15 1215   08/20/15 1245  piperacillin-tazobactam (ZOSYN) IVPB 3.375 g     3.375 g 100 mL/hr over 30 Minutes Intravenous STAT 08/20/15 1238 08/20/15 1334   08/20/15 1245  vancomycin (VANCOCIN) 2,000 mg in sodium chloride 0.9 % 500 mL IVPB     2,000 mg 250 mL/hr over 120 Minutes Intravenous  Once 08/20/15 1240 08/20/15 1532      HPI/Subjective: States breathing better today  Objective: Filed Vitals:   08/24/15 0540 08/24/15 0826 08/24/15 1100 08/24/15 1317  BP: 151/82   141/73  Pulse: 58   64  Temp: 97.4 F (  36.3 C)   97.7 F (36.5 C)  TempSrc: Oral   Oral  Resp: 20   20  Height:      Weight:      SpO2: 90% 89% 51% 90%    Intake/Output Summary (Last 24 hours) at 08/24/15 1540 Last data filed at 08/24/15 1319  Gross per 24 hour  Intake   1680 ml  Output   2025 ml  Net   -345 ml   Filed Weights   08/20/15 1307 08/20/15 1635  Weight: 127.007 kg (280 lb) 131.1 kg (289 lb 0.4 oz)     Exam:   General:  Asleep, easily arousable, in nad  Cardiovascular: regular, s1, s2  Respiratory: normal resp effort, mild end-expiratory wheezing at bases, improved  Abdomen: soft, obese, nonidstended, pos BS  Musculoskeletal: perfused, no clubbing   Data Reviewed: Basic Metabolic Panel:  Recent Labs Lab 08/20/15 1147 08/21/15 0350 08/22/15 0511 08/23/15 0531 08/24/15 0520  NA 143 142 142 143 142  K 4.1 4.0 4.8 4.5 4.7  CL 101 103 103 105 102  CO2 33* 32 33* 31 32  GLUCOSE 106* 197* 167* 162* 139*  BUN 57* 56* 50* 56* 62*  CREATININE 2.56* 2.59* 2.66* 2.59* 2.58*  CALCIUM 8.3* 8.3* 8.5* 8.6* 8.6*   Liver Function Tests:  Recent Labs Lab 08/20/15 1147 08/21/15 0350  AST 11* 13*  ALT 19 18  ALKPHOS 80 76  BILITOT 0.4 0.5  PROT 6.3* 6.1*  ALBUMIN 2.9* 2.7*   No results for input(s): LIPASE, AMYLASE in the last 168 hours. No results for input(s): AMMONIA in the last 168 hours. CBC:  Recent Labs Lab 08/20/15 1147 08/21/15 0350 08/22/15 0511 08/23/15 0531 08/24/15 0520  WBC 23.7* 25.7* 22.6* 19.5* 14.2*  NEUTROABS 20.7*  --   --   --   --   HGB 9.3* 8.4* 8.5* 8.7* 8.9*  HCT 32.5* 29.3* 29.6* 30.9* 30.0*  MCV 85.1 84.0 83.6 84.4 83.8  PLT 275 255 269 298 266   Cardiac Enzymes: No results for input(s): CKTOTAL, CKMB, CKMBINDEX, TROPONINI in the last 168 hours. BNP (last 3 results)  Recent Labs  04/18/15 1554 08/09/15 0746  BNP 496.3* 657.8*    ProBNP (last 3 results) No results for input(s): PROBNP in the last 8760 hours.  CBG: No results for input(s): GLUCAP in the last 168 hours.  Recent Results (from the past 240 hour(s))  Urine culture     Status: None   Collection Time: 08/20/15 11:22 AM  Result Value Ref Range Status   Specimen Description URINE, CLEAN CATCH  Final   Special Requests NONE  Final   Culture   Final    2,000 COLONIES/mL INSIGNIFICANT GROWTH Performed at [facility]    Report Status 08/21/2015 FINAL   Final  Culture, blood (routine x 2)     Status: None (Preliminary result)   Collection Time: 08/20/15 11:45 AM  Result Value Ref Range Status   Specimen Description BLOOD RIGHT HAND  Final   Special Requests BOTTLES DRAWN AEROBIC AND ANAEROBIC 5ML  Final   Culture   Final    NO GROWTH 3 DAYS Performed at [facility]    Report Status PENDING  Incomplete  Culture, blood (routine x 2)     Status: None (Preliminary result)   Collection Time: 08/20/15 11:55 AM  Result Value Ref Range Status   Specimen Description BLOOD LEFT HAND  Final   Special Requests BOTTLES DRAWN AEROBIC AND ANAEROBIC 5ML  Final   Culture   Final    NO GROWTH 3 DAYS Performed at [facility]    Report Status PENDING  Incomplete  MRSA PCR Screening     Status: None   Collection Time: 08/20/15  4:41 PM  Result Value Ref Range Status   MRSA by PCR NEGATIVE NEGATIVE Final    Comment:        The GeneXpert MRSA Assay (FDA approved for NASAL specimens only), is one component of a comprehensive MRSA colonization surveillance program. It is not intended to diagnose MRSA infection nor to guide or monitor treatment for MRSA infections.      Studies: No results found.  Scheduled Meds: . amLODipine  10 mg Oral q morning - 10a  . amoxicillin-clavulanate  1 tablet Oral Q12H  . aspirin EC  81 mg Oral q morning - 10a  . docusate sodium  100 mg Oral Daily  . gabapentin  300 mg Oral QHS  . galantamine  8 mg Oral BID WC  . heparin  5,000 Units Subcutaneous 3 times per day  . ipratropium-albuterol  3 mL Nebulization QID  . levothyroxine  125 mcg Oral QAC breakfast  . methylPREDNISolone (SOLU-MEDROL) injection  60 mg Intravenous Q12H  . metoprolol  100 mg Oral BID  . pantoprazole  40 mg Oral Daily  . polyvinyl alcohol  1 drop Both Eyes TID  . pravastatin  20 mg Oral QHS  . QUEtiapine  125 mg Oral QHS  . sertraline  150 mg Oral QHS  . sodium chloride  3 mL Intravenous Q12H  . tamsulosin  0.4 mg Oral  Daily   Continuous Infusions:   Principal Problem:   Acute respiratory failure with hypoxia (HCC) Active Problems:   Essential hypertension   Oropharynx cancer (HCC)   CKD (chronic kidney disease) stage 4, GFR 15-29 ml/min (HCC)   Obesity hypoventilation syndrome (HCC)   HCAP (healthcare-associated pneumonia)   [facility], [place] [facility] Pager [phone]. If 7PM-7AM, please contact night-coverage at [email or web address], password [facility] 08/24/2015, 3:40 PM  LOS: 4 days

## 2015-08-24 NOTE — Evaluation (Signed)
Physical Therapy Evaluation Patient Details Name: Wesley [name] MRN: [id number] DOB: 05-24-1946 Today's Date: 08/24/2015   History of Present Illness  69 y.o. male with a hx of CKD4, htn, obesity hypoventilation syndrome on chronic home O2 at 3L, oropharyngeal cancer ; recently discharged for HCAP on 12/1; On admission - 08/20/15 pt with  increasing sob and hypoxia with increased O2 requirements (up to 11L NRB) and temp of 100.39F.   Clinical Impression  Pt admitted with above diagnosis. Pt currently with functional limitations due to the deficits listed below (see PT Problem List).  Pt will benefit from skilled PT to increase their independence and safety with mobility to allow discharge to the venue listed below.  Pt does not feel he needs f/u therapy, will follow on acute  Pt in bathroom per RNs assistance on arrival; checked pt's sats upon exiting BR --->51% on RA O2 replaced at 4L [place] with pt seated and after 4 minutes sats 89% with pursed lip breathing O2 sats 77% during amb on 4-6L  O2 sats >89% on 4-3.5L after 3 min rest (following amb) HR maintained  60s througout    Follow Up Recommendations No PT follow up    Equipment Recommendations  None recommended by PT    Recommendations for Other Services       Precautions / Restrictions Precautions Precaution Comments: home O2 Restrictions Weight Bearing Restrictions: No      Mobility  Bed Mobility               General bed mobility comments: NT, pt remained EOB per his request  Transfers Overall transfer level: Needs assistance Equipment used: None Transfers: Sit to/from Stand Sit to Stand: Supervision            Ambulation/Gait Ambulation/Gait assistance: Supervision Ambulation Distance (Feet): 45 Feet Assistive device:  (IV pole, uses cane at baseline) Gait Pattern/deviations: Step-through pattern;Trunk flexed;Decreased stride length   Gait velocity interpretation: Below normal speed for  age/gender General Gait Details: supervision for safety; cues for breathing and posture; on 4-6L O2 throughout;   Stairs            Wheelchair Mobility    Modified Rankin (Stroke Patients Only)       Balance Overall balance assessment: Needs assistance           Standing balance-Leahy Scale: Fair                               Pertinent Vitals/Pain      Home Living Family/patient expects to be discharged to:: Private residence Living Arrangements: Spouse/significant other   Type of Home: House       Home Layout: Able to live on main level with bedroom/bathroom;Two level Home Equipment: Cane - single point Additional Comments: pt reports he goes up/down stairs once per day    Prior Function Level of Independence: Independent with assistive device(s)         Comments: chronic 4L O2     Hand Dominance        Extremity/Trunk Assessment   Upper Extremity Assessment: Defer to OT evaluation           Lower Extremity Assessment: Generalized weakness         Communication   Communication: No difficulties  Cognition Arousal/Alertness: Awake/alert Behavior During Therapy: WFL for tasks assessed/performed Overall Cognitive Status: Within Functional Limits for tasks assessed  General Comments General comments (skin integrity, edema, etc.): postural exercises, trunk extension/shoulder retraction    Exercises        Assessment/Plan    PT Assessment Patient needs continued PT services  PT Diagnosis Difficulty walking   PT Problem List Decreased activity tolerance;Decreased mobility;Cardiopulmonary status limiting activity  PT Treatment Interventions DME instruction;Gait training;Functional mobility training;Therapeutic activities;Therapeutic exercise;Patient/family education   PT Goals (Current goals can be found in the Care Plan section) Acute Rehab PT Goals Patient Stated Goal: none stated PT Goal  Formulation: With patient Time For Goal Achievement: 08/31/15 Potential to Achieve Goals: Good    Frequency Min 3X/week   Barriers to discharge        Co-evaluation               End of Session Equipment Utilized During Treatment: Gait belt Activity Tolerance: Patient tolerated treatment well;Treatment limited secondary to medical complications (Comment) (o2 sats) Patient left: in bed;with call bell/phone within reach;Other (comment) (ST) Nurse Communication: Mobility status (O2 sats)         Time: [id number] PT Time Calculation (min) (ACUTE ONLY): 31 min   Charges:   PT Evaluation $Initial PT Evaluation Tier I: 1 Procedure PT Treatments $Gait Training: 8-22 mins   PT G Codes:        Roiza Wiedel 09-11-2015, 11:26 AM

## 2015-08-24 NOTE — Progress Notes (Signed)
Speech Language Pathology Treatment: Dysphagia  Patient Details Name: Wesley [name] MRN: [id number] DOB: [birth year] Today's Date: 08/24/2015 Time: 7944-4619 SLP Time Calculation (min) (ACUTE ONLY): 16 min  Assessment / Plan / Recommendation Clinical Impression  Treatment focused on patient education and differential diagnosis to determine options for progression of diet. Patient independent with demonstration of swallowing precautions during po trials provided by SLP this am. Patient with a multifactorial dysphagia with h/o esophageal impairments, oropharyngeal cancer s/p radiation treatment, and respiratory impairments with O2 dependence. He reports continued coughing with thin liquids, ceasing with consumption of nectar thick liquids and use of chin tuck strategy. Discussed options including repeat instrumental exam (FEES at [employer] as OP) vs modified diet vs diet of choice with known risk of aspiration. Patient verbalized desire to continue on current diet, understanding and accepting potential aspiration risk which could be oropharyngeal or esophageal in nature. Again patient independent with strategies. No further acute SLP needs indicated at this time. If patient should wish for repeat instrumental testing to establish new baseline and determine if upgrade is possible with use of compensatory strategies, FEES would be recommended.    HPI HPI: 69 y.o. male with past medical history significant for chronic O2 dependence for obesity hypoventilation syndrome re-admitted with hypoxia.  Recent admission (11/27-12/1) for HCAP.  Hx HTN, oropharyngeal cancer, s/p radiation tx.  Hx aspiration, esophageal surgery, achalasia.        SLP Plan  All goals met     Recommendations  Diet recommendations: Dysphagia 3 (mechanical soft);Nectar-thick liquid Liquids provided via: Cup;No straw Medication Administration: Whole meds with puree Supervision: Patient able to self feed Compensations: Slow rate;Small  sips/bites Postural Changes and/or Swallow Maneuvers: Chin tuck;Seated upright 90 degrees;Upright 30-60 min after meal              Oral Care Recommendations: Oral care BID Follow up Recommendations: Home health SLP Plan: All goals met  Gabriel Rainwater [place], CCC-SLP [phone]  Gabriel Rainwater [place] 08/24/2015, 11:36 AM

## 2015-08-24 NOTE — Care Management Important Message (Signed)
Important Message  Patient Details  Name: Wesley [name] MRN: [id number] Date of Birth: [birth year]   Medicare Important Message Given:  Yes    Camillo Flaming 08/24/2015, 2:07 [place] Message  Patient Details  Name: Wesley [name] MRN: [id number] Date of Birth: 14-Jan-1946   Medicare Important Message Given:  Yes    Camillo Flaming 08/24/2015, 2:07 PM

## 2015-08-24 NOTE — Progress Notes (Signed)
PHARMACY NOTE -  Renal dose adjustment for antibiotics  Pharmacy has been assisting with dosing of antibiotics for PNA.  He was transitioned to oral Augmentin yesterday.  Renal function continues to improve.  Dosage remains stable at Augmentin 875mg  po BID and need for further dosage adjustment appears unlikely at present.    Will sign off at this time.  Please reconsult if a change in clinical status warrants re-evaluation of dosage.  Netta Cedars, PharmD, BCPS Pager: [phone] 08/24/2015@10 :50 AM

## 2015-08-24 NOTE — Progress Notes (Signed)
Date: August 24, 2015 Chart reviewed for concurrent status and case management needs. Will continue to follow patient for changes and needs: Rhonda Davis, RN, BSN, CCM   [phone] 

## 2015-08-24 NOTE — Progress Notes (Signed)
Patient usually on 3 L of oxygen at home. Attempted to wean oxygen from 4L to 3L, patient oxygen saturation levels on 3L is 87%. Increased oxygen back to 4L, patient oxygen saturation level 90%. Well continue to monitor and follow plan of care.

## 2015-08-25 ENCOUNTER — Encounter (HOSPITAL_COMMUNITY): Payer: Self-pay

## 2015-08-25 ENCOUNTER — Inpatient Hospital Stay (HOSPITAL_COMMUNITY): Payer: Medicare Other

## 2015-08-25 LAB — BASIC METABOLIC PANEL
ANION GAP: 7 (ref 5–15)
BUN: 62 mg/dL — ABNORMAL HIGH (ref 6–20)
CHLORIDE: 104 mmol/L (ref 101–111)
CO2: 34 mmol/L — AB (ref 22–32)
Calcium: 9.2 mg/dL (ref 8.9–10.3)
Creatinine, Ser: 2.49 mg/dL — ABNORMAL HIGH (ref 0.61–1.24)
GFR calc non Af Amer: 25 mL/min — ABNORMAL LOW (ref >60)
GFR, EST AFRICAN AMERICAN: 29 mL/min — AB (ref >60)
Glucose, Bld: 114 mg/dL — ABNORMAL HIGH (ref 65–99)
Potassium: 4.9 mmol/L (ref 3.5–5.1)
Sodium: 145 mmol/L (ref 135–145)

## 2015-08-25 LAB — CULTURE, BLOOD (ROUTINE X 2)
CULTURE: NO GROWTH
Culture: NO GROWTH

## 2015-08-25 LAB — CBC
HEMATOCRIT: 31.9 % — AB (ref 39.0–52.0)
HEMOGLOBIN: 9.1 g/dL — AB (ref 13.0–17.0)
MCH: 24.1 pg — AB (ref 26.0–34.0)
MCHC: 28.5 g/dL — ABNORMAL LOW (ref 30.0–36.0)
MCV: 84.6 fL (ref 78.0–100.0)
Platelets: 260 10*3/uL (ref 150–400)
RBC: 3.77 MIL/uL — AB (ref 4.22–5.81)
RDW: 17.5 % — ABNORMAL HIGH (ref 11.5–15.5)
WBC: 21.7 10*3/uL — ABNORMAL HIGH (ref 4.0–10.5)

## 2015-08-25 MED ORDER — VANCOMYCIN HCL 10 G IV SOLR
2000.0000 mg | Freq: Once | INTRAVENOUS | Status: AC
  Administered 2015-08-25: 2000 mg via INTRAVENOUS
  Filled 2015-08-25: qty 2000

## 2015-08-25 MED ORDER — VANCOMYCIN HCL 10 G IV SOLR
1500.0000 mg | INTRAVENOUS | Status: DC
  Administered 2015-08-26 – 2015-08-27 (×2): 1500 mg via INTRAVENOUS
  Filled 2015-08-25 (×2): qty 1500

## 2015-08-25 MED ORDER — PIPERACILLIN-TAZOBACTAM 3.375 G IVPB 30 MIN
3.3750 g | Freq: Three times a day (TID) | INTRAVENOUS | Status: DC
  Administered 2015-08-25 – 2015-08-27 (×7): 3.375 g via INTRAVENOUS
  Filled 2015-08-25 (×9): qty 50

## 2015-08-25 MED ORDER — METHYLPREDNISOLONE SODIUM SUCC 125 MG IJ SOLR
60.0000 mg | Freq: Three times a day (TID) | INTRAMUSCULAR | Status: DC
  Administered 2015-08-25 – 2015-08-26 (×3): 60 mg via INTRAVENOUS
  Filled 2015-08-25 (×6): qty 0.96

## 2015-08-25 NOTE — Progress Notes (Signed)
[facility] PROGRESS NOTE  Wesley [name] [id number] DOB: [birth year] DOA: 08/20/2015 PCP: [name], MD  HPI/Brief narrative 69 y.o. 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 On the day of admission, pt noted to have increasing sob and hypoxia with increased O2 requirements (up to 11L NRB) and temp of 100.42F. Pt subsequently presented to ED where CXR was notable for B diffuse airspace disease sparing the RUL that has progressed since prior xray. Patient was started on empiric vanc and zosyn and hospitalist consulted for consideration for admission  Assessment/Plan: 1. Acute respiratory failure with hypoxia secondary to pneumonia vs bronchitis 1. On presentation, pt initially required [facility] in the setting of recurrent HCAP vs aspiration PNA (see below) 2. Baseline O2 requirements of 3LNC 3. Cont pulm toilet and O2 as needed 4. O2 requirements initially improved to baseline 3LNC however overnight, pt required increased O2 requirements, up to [facility]. See below 2. HCAP vs aspiration PNA with sepsis 1. Patient presented febrile with leukocytosis (albeit pt has been taking steroids prior to admit) 2. Recently admitted and discharged for HCAP 3. CXR confirms B diffuse airspace disease that has worsened on today's f/u xray 4. Initially improved with empiric vancomycin and zosyn however, patient has clinically worsened while on augmentin 5. Leukocytosis back up to 21k today 6. Will resume empiric vanc and zosyn for now 3. HTN 1. BP remains stable at present 2. Cont to monitor 4. CKD stage 4 1. Monitor renal function closely 2. Stable at present 5. DVT prophylaxis 1. Heparin subQ while admitted 6. Hx Dysphagia 1. Patient reports long hx of dysphagia that patient compensates by "taking a big gulp when I  swallow" 2. Patient was empirically continued on dysphagia 3 diet, agreed upon by SLP 3. On further questioning, patient describes what seems to be episodes of aspiration of regular food prior to admission. See above  Code Status: Full Family Communication: Pt in room Disposition Plan:  Home when on home O2 requirements and on minimal steroid dose   Consultants:    Procedures:    Antibiotics: Anti-infectives    Start     Dose/Rate Route Frequency Ordered Stop   08/26/15 0800  vancomycin (VANCOCIN) 1,500 mg in sodium chloride 0.9 % 500 mL IVPB     1,500 mg 250 mL/hr over 120 Minutes Intravenous Every 24 hours 08/25/15 0838     08/25/15 0900  vancomycin (VANCOCIN) 2,000 mg in sodium chloride 0.9 % 500 mL IVPB     2,000 mg 250 mL/hr over 120 Minutes Intravenous  Once 08/25/15 0838 08/25/15 1225   08/25/15 0800  piperacillin-tazobactam (ZOSYN) IVPB 3.375 g     3.375 g 12.5 mL/hr over 240 Minutes Intravenous Every 8 hours 08/25/15 0753     08/23/15 1300  amoxicillin-clavulanate (AUGMENTIN) 875-125 MG per tablet 1 tablet  Status:  Discontinued     1 tablet Oral Every 12 hours 08/23/15 1215 08/25/15 0753   08/21/15 1600  vancomycin (VANCOCIN) 1,500 mg in sodium chloride 0.9 % 500 mL IVPB  Status:  Discontinued     1,500 mg 250 mL/hr over 120 Minutes Intravenous Every 24 hours 08/20/15 1305 08/23/15 1215   08/20/15 2200  piperacillin-tazobactam (ZOSYN) IVPB 3.375 g  Status:  Discontinued     3.375 g 12.5 mL/hr over 240 Minutes Intravenous Every 8 hours 08/20/15 1305 08/23/15 1215  08/20/15 1245  piperacillin-tazobactam (ZOSYN) IVPB 3.375 g     3.375 g 100 mL/hr over 30 Minutes Intravenous STAT 08/20/15 1238 08/20/15 1334   08/20/15 1245  vancomycin (VANCOCIN) 2,000 mg in sodium chloride 0.9 % 500 mL IVPB     2,000 mg 250 mL/hr over 120 Minutes Intravenous  Once 08/20/15 1240 08/20/15 1532      HPI/Subjective: Reports feeling more sob today  Objective: Filed Vitals:    08/25/15 0539 08/25/15 0559 08/25/15 0841 08/25/15 1403  BP: 171/89 152/71  119/61  Pulse: 55 56  66  Temp: 97.9 F (36.6 C)   97.7 F (36.5 C)  TempSrc: Oral   Oral  Resp: 19   20  Height:      Weight:      SpO2: 98%  92% 94%    Intake/Output Summary (Last 24 hours) at 08/25/15 1448 Last data filed at 08/25/15 1417  Gross per 24 hour  Intake      0 ml  Output   2270 ml  Net  -2270 ml   Filed Weights   08/20/15 1307 08/20/15 1635  Weight: 127.007 kg (280 lb) 131.1 kg (289 lb 0.4 oz)    Exam:   General:  Awake, laying in bed, in nad  Cardiovascular: regular, s1, s2  Respiratory: mildly increased resp effort, mild end-expiratory wheezing at bases  Abdomen: soft, obese, nonidstended, pos BS  Musculoskeletal: perfused, no clubbing, no cyanosis  Data Reviewed: Basic Metabolic Panel:  Recent Labs Lab 08/21/15 0350 08/22/15 0511 08/23/15 0531 08/24/15 0520 08/25/15 0514  NA 142 142 143 142 145  K 4.0 4.8 4.5 4.7 4.9  CL 103 103 105 102 104  CO2 32 33* 31 32 34*  GLUCOSE 197* 167* 162* 139* 114*  BUN 56* 50* 56* 62* 62*  CREATININE 2.59* 2.66* 2.59* 2.58* 2.49*  CALCIUM 8.3* 8.5* 8.6* 8.6* 9.2   Liver Function Tests:  Recent Labs Lab 08/20/15 1147 08/21/15 0350  AST 11* 13*  ALT 19 18  ALKPHOS 80 76  BILITOT 0.4 0.5  PROT 6.3* 6.1*  ALBUMIN 2.9* 2.7*   No results for input(s): LIPASE, AMYLASE in the last 168 hours. No results for input(s): AMMONIA in the last 168 hours. CBC:  Recent Labs Lab 08/20/15 1147 08/21/15 0350 08/22/15 0511 08/23/15 0531 08/24/15 0520 08/25/15 0514  WBC 23.7* 25.7* 22.6* 19.5* 14.2* 21.7*  NEUTROABS 20.7*  --   --   --   --   --   HGB 9.3* 8.4* 8.5* 8.7* 8.9* 9.1*  HCT 32.5* 29.3* 29.6* 30.9* 30.0* 31.9*  MCV 85.1 84.0 83.6 84.4 83.8 84.6  PLT 275 255 269 298 266 260   Cardiac Enzymes: No results for input(s): CKTOTAL, CKMB, CKMBINDEX, TROPONINI in the last 168 hours. BNP (last 3 results)  Recent Labs   04/18/15 1554 08/09/15 0746  BNP 496.3* 657.8*    ProBNP (last 3 results) No results for input(s): PROBNP in the last 8760 hours.  CBG: No results for input(s): GLUCAP in the last 168 hours.  Recent Results (from the past 240 hour(s))  Urine culture     Status: None   Collection Time: 08/20/15 11:22 AM  Result Value Ref Range Status   Specimen Description URINE, CLEAN CATCH  Final   Special Requests NONE  Final   Culture   Final    2,000 COLONIES/mL INSIGNIFICANT GROWTH Performed at [facility]    Report Status 08/21/2015 FINAL  Final  Culture, blood (routine x  2)     Status: None   Collection Time: 08/20/15 11:45 AM  Result Value Ref Range Status   Specimen Description BLOOD RIGHT HAND  Final   Special Requests BOTTLES DRAWN AEROBIC AND ANAEROBIC 5ML  Final   Culture   Final    NO GROWTH 5 DAYS Performed at [facility]    Report Status 08/25/2015 FINAL  Final  Culture, blood (routine x 2)     Status: None   Collection Time: 08/20/15 11:55 AM  Result Value Ref Range Status   Specimen Description BLOOD LEFT HAND  Final   Special Requests BOTTLES DRAWN AEROBIC AND ANAEROBIC 5ML  Final   Culture   Final    NO GROWTH 5 DAYS Performed at [facility]    Report Status 08/25/2015 FINAL  Final  MRSA PCR Screening     Status: None   Collection Time: 08/20/15  4:41 PM  Result Value Ref Range Status   MRSA by PCR NEGATIVE NEGATIVE Final    Comment:        The GeneXpert MRSA Assay (FDA approved for NASAL specimens only), is one component of a comprehensive MRSA colonization surveillance program. It is not intended to diagnose MRSA infection nor to guide or monitor treatment for MRSA infections.      Studies: Dg Chest Port 1 View  08/25/2015  CLINICAL DATA:  Follow-up aspiration pneumonia. Cough, congestion, shortness of Breath EXAM: PORTABLE CHEST 1 VIEW COMPARISON:  08/20/2015 FINDINGS: Severe patchy bilateral airspace disease again noted,  increasing throughout the right lung, stable on the left. Possible small effusions. There is cardiomegaly. No acute bony abnormality. IMPRESSION: Severe patchy bilateral airspace disease again noted, worsening on the right since prior study. Suspect small effusions. Electronically Signed   By: Rolm Baptise M.D.   On: 08/25/2015 08:58    Scheduled Meds: . amLODipine  10 mg Oral q morning - 10a  . aspirin EC  81 mg Oral q morning - 10a  . docusate sodium  100 mg Oral Daily  . gabapentin  300 mg Oral QHS  . galantamine  8 mg Oral BID WC  . heparin  5,000 Units Subcutaneous 3 times per day  . ipratropium-albuterol  3 mL Nebulization QID  . levothyroxine  125 mcg Oral QAC breakfast  . methylPREDNISolone (SOLU-MEDROL) injection  60 mg Intravenous Q8H  . metoprolol  100 mg Oral BID  . pantoprazole  40 mg Oral Daily  . piperacillin-tazobactam  3.375 g Intravenous Q8H  . polyvinyl alcohol  1 drop Both Eyes TID  . pravastatin  20 mg Oral QHS  . QUEtiapine  125 mg Oral QHS  . sertraline  150 mg Oral QHS  . sodium chloride  3 mL Intravenous Q12H  . tamsulosin  0.4 mg Oral Daily  . [START ON 08/26/2015] vancomycin  1,500 mg Intravenous Q24H   Continuous Infusions:   Principal Problem:   Acute respiratory failure with hypoxia (HCC) Active Problems:   Essential hypertension   Oropharynx cancer (HCC)   CKD (chronic kidney disease) stage 4, GFR 15-29 ml/min (HCC)   Obesity hypoventilation syndrome (HCC)   HCAP (healthcare-associated pneumonia)   Leman Martinek, [place] [facility] Pager [phone]. If 7PM-7AM, please contact night-coverage at [email or web address], password [facility] 08/25/2015, 2:48 PM  LOS: 5 days

## 2015-08-25 NOTE — Progress Notes (Signed)
ANTIBIOTIC CONSULT NOTE - INITIAL  Pharmacy Consult for Vancomycin, antibiotic renal dose adjustment  Indication: pneumonia  No Known Allergies  Patient Measurements: Height: 5\' 9"  (175.3 cm) Weight: 289 lb 0.4 oz (131.1 kg) IBW/kg (Calculated) : 70.7  Vital Signs: Temp: 97.9 F (36.6 C) (12/13 0539) Temp Source: Oral (12/13 0539) BP: 152/71 mmHg (12/13 0559) Pulse Rate: 56 (12/13 0559) Intake/Output from previous day: 12/12 0701 - 12/13 0700 In: 960 [P.O.:960] Out: 2220 [Urine:2220] Intake/Output from this shift: Total I/O In: -  Out: 200 [Urine:200]  Labs:  Recent Labs  08/23/15 0531 08/24/15 0520 08/25/15 0514  WBC 19.5* 14.2* 21.7*  HGB 8.7* 8.9* 9.1*  PLT 298 266 260  CREATININE 2.59* 2.58* 2.49*   Estimated Creatinine Clearance: 37.1 mL/min (by C-G formula based on Cr of 2.49). No results for input(s): VANCOTROUGH, VANCOPEAK, VANCORANDOM, GENTTROUGH, GENTPEAK, GENTRANDOM, TOBRATROUGH, TOBRAPEAK, TOBRARND, AMIKACINPEAK, AMIKACINTROU, AMIKACIN in the last 72 hours.    Assessment: 69 year old male recently hospitalized 11/27-12/1 with CAP (tx with Rocephin/Zithro and dc'd on Levaquin) presented 12/8 with with fever, SHoB and increased O2 requirements. Pharmacy was initially consulted to dose vancomycin and Zosyn for presumed HCAP.  Antibiotics were narrowed to Augmentin on 12/11.  Today, pharmacy is consulted to resume Vancomycin and Zosyn dosing.  Today, 08/25/2015: Afebrile WBC increased (solumedrol) SCr 2.49 (elevated at baseline ~ 2.5), CrCl ~ 37 ml/min CG, ~ 28 ml/min N  Antimicrobials this admission: 12/8 >> Vanc >> 12/11, 12/13 >> Resumed Vanc >> 12/8 >> Zosyn >> 12/11, 12/13 >> Resumed Zosyn >> 12/11>>Augmentin >> 12/13  Levels/dose changes this admission: 12/11 VT at 15:30, before 4th total dose - cancelled  Microbiology Results: 12/8 blood x 2: ngtd 12/8 urine: 2k insignificant growth 12/8 Strep pneumo/Legionella: neg for both 12/8 MRSA  PCR: negative 12/9 HIV Ab: neg  Goal of Therapy:  Vancomycin trough level 15-20 mcg/ml  Appropriate abx dosing, eradication of infection.   Plan:   Zosyn 3.375g IV Q8H infused over 4hrs.  Vancomycin 2000mg  IV once, then 1500mg  IV q24h.  Measure Vanc trough at steady state.  Follow up renal fxn, culture results, and clinical course.   Gretta Arab PharmD, BCPS Pager [phone] 08/25/2015 8:35 AM

## 2015-08-26 ENCOUNTER — Telehealth: Payer: Self-pay | Admitting: Pulmonary Disease

## 2015-08-26 DIAGNOSIS — J189 Pneumonia, unspecified organism: Secondary | ICD-10-CM

## 2015-08-26 DIAGNOSIS — E662 Morbid (severe) obesity with alveolar hypoventilation: Secondary | ICD-10-CM

## 2015-08-26 DIAGNOSIS — J9601 Acute respiratory failure with hypoxia: Secondary | ICD-10-CM

## 2015-08-26 DIAGNOSIS — I10 Essential (primary) hypertension: Secondary | ICD-10-CM

## 2015-08-26 LAB — CBC
HEMATOCRIT: 31.3 % — AB (ref 39.0–52.0)
Hemoglobin: 8.9 g/dL — ABNORMAL LOW (ref 13.0–17.0)
MCH: 24.1 pg — ABNORMAL LOW (ref 26.0–34.0)
MCHC: 28.4 g/dL — ABNORMAL LOW (ref 30.0–36.0)
MCV: 84.8 fL (ref 78.0–100.0)
PLATELETS: 240 10*3/uL (ref 150–400)
RBC: 3.69 MIL/uL — AB (ref 4.22–5.81)
RDW: 17.3 % — AB (ref 11.5–15.5)
WBC: 19.7 10*3/uL — AB (ref 4.0–10.5)

## 2015-08-26 LAB — BASIC METABOLIC PANEL
Anion gap: 8 (ref 5–15)
BUN: 61 mg/dL — AB (ref 6–20)
CO2: 36 mmol/L — ABNORMAL HIGH (ref 22–32)
Calcium: 9.6 mg/dL (ref 8.9–10.3)
Chloride: 102 mmol/L (ref 101–111)
Creatinine, Ser: 2.35 mg/dL — ABNORMAL HIGH (ref 0.61–1.24)
GFR, EST AFRICAN AMERICAN: 31 mL/min — AB (ref >60)
GFR, EST NON AFRICAN AMERICAN: 26 mL/min — AB (ref >60)
Glucose, Bld: 149 mg/dL — ABNORMAL HIGH (ref 65–99)
POTASSIUM: 5 mmol/L (ref 3.5–5.1)
SODIUM: 146 mmol/L — AB (ref 135–145)

## 2015-08-26 MED ORDER — PANTOPRAZOLE SODIUM 40 MG PO TBEC
40.0000 mg | DELAYED_RELEASE_TABLET | Freq: Two times a day (BID) | ORAL | Status: DC
  Administered 2015-08-26 – 2015-08-29 (×6): 40 mg via ORAL
  Filled 2015-08-26 (×7): qty 1

## 2015-08-26 MED ORDER — METHYLPREDNISOLONE SODIUM SUCC 40 MG IJ SOLR
40.0000 mg | Freq: Two times a day (BID) | INTRAMUSCULAR | Status: DC
  Administered 2015-08-26 – 2015-08-27 (×2): 40 mg via INTRAVENOUS
  Filled 2015-08-26 (×5): qty 1

## 2015-08-26 NOTE — Progress Notes (Signed)
Physical Therapy Treatment Patient Details Name: Wesley [name] MRN: [id number] DOB: 1946-07-28 Today's Date: 09/03/15    History of Present Illness 69 y.o. male with a hx of CKD4, htn, obesity hypoventilation syndrome on chronic home O2 at 3L, oropharyngeal cancer ; recently discharged for HCAP on 12/1; On admission - 08/20/15 pt with  increasing sob and hypoxia with increased O2 requirements (up to 11L NRB) and temp of 100.52F.     PT Comments    Pt assisted with ambulating in hallway.  Pt requiring 6L O2 during physical activity.  Follow Up Recommendations  No PT follow up     Equipment Recommendations  None recommended by PT    Recommendations for Other Services       Precautions / Restrictions Precautions Precautions: Fall Precaution Comments: home O2 Restrictions Weight Bearing Restrictions: No    Mobility  Bed Mobility               General bed mobility comments: pt up in recliner on arrival  Transfers Overall transfer level: Needs assistance Equipment used: None Transfers: Sit to/from Stand Sit to Stand: Supervision            Ambulation/Gait Ambulation/Gait assistance: Supervision;Min guard Ambulation Distance (Feet): 60 Feet Assistive device:  (IV pole) Gait Pattern/deviations: Step-through pattern;Trunk flexed;Decreased stride length     General Gait Details: pt declined using cane (baseline), did utilize IV pole for support, SpO2 decreased to 76% on 4L O2 so increased to 6L and pt cued for pursed lip breathing, once resting in recliner returned to 94% back on 4L O2 [place]   Stairs            Wheelchair Mobility    Modified Rankin (Stroke Patients Only)       Balance                                    Cognition Arousal/Alertness: Awake/alert Behavior During Therapy: WFL for tasks assessed/performed Overall Cognitive Status: Within Functional Limits for tasks assessed                      Exercises       General Comments        Pertinent Vitals/Pain Pain Assessment: No/denies pain    Home Living                      Prior Function            PT Goals (current goals can now be found in the care plan section) Progress towards PT goals: Progressing toward goals    Frequency  Min 3X/week    PT Plan Current plan remains appropriate    Co-evaluation             End of Session Equipment Utilized During Treatment: Gait belt Activity Tolerance: Patient tolerated treatment well (however SpO2 decreased) Patient left: in chair;with call bell/phone within reach;with chair alarm set     Time: [phone] PT Time Calculation (min) (ACUTE ONLY): 11 min  Charges:  $Gait Training: 8-22 mins                    G Codes:      Shedrick Sarli,KATHrine E 09/03/15, 10:13 AM Carmelia Bake, PT, DPT 2015-09-03 Pager: [phone]

## 2015-08-26 NOTE — Progress Notes (Signed)
[facility] PROGRESS NOTE  Wesley [name] [id number] DOB: 12-25-1945 DOA: 08/20/2015 PCP: [name], MD  HPI/Brief narrative 69 y.o. 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 On the day of admission, pt noted to have increasing sob and hypoxia with increased O2 requirements (up to 11L NRB) and temp of 100.45F. Pt subsequently presented to ED where CXR was notable for B diffuse airspace disease sparing the RUL that has progressed since prior xray. Patient was started on empiric vanc and zosyn and hospitalist consulted for consideration for admission  Assessment/Plan: 1. Acute respiratory failure with hypoxia secondary to aspiration pneumonia -on admission, initially required [facility] in the setting of recurrent aspiration PNA  -uses 3LNC at baseline -see below  2. 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 -Continue vancomycin and Zosyn for now -We will change to oral cefpodoxime tomorrow, discussed with PharmD -repeat CXr in am -continue compensatory measures for swallowing  3. HTN -stable  4. CKD stage 4 -stable  5. Hx Dysphagia -see discussion above -increase protonix to BID  DVT proph: Hep SQ  Code Status: Full Family Communication: none at bedsode, called and updated spouse Disposition Plan:  Home when on home O2  requirements and on minimal steroid dose   Consultants:    Procedures:    Antibiotics: Anti-infectives    Start     Dose/Rate Route Frequency Ordered Stop   08/26/15 0800  vancomycin (VANCOCIN) 1,500 mg in sodium chloride 0.9 % 500 mL IVPB     1,500 mg 250 mL/hr over 120 Minutes Intravenous Every 24 hours 08/25/15 0838     08/25/15 0900  vancomycin (VANCOCIN) 2,000 mg in sodium chloride 0.9 % 500 mL IVPB     2,000 mg 250 mL/hr over 120 Minutes Intravenous  Once 08/25/15 0838 08/25/15 1225   08/25/15 0800  piperacillin-tazobactam (ZOSYN) IVPB 3.375 g     3.375 g 12.5 mL/hr over 240 Minutes Intravenous Every 8 hours 08/25/15 0753     08/23/15 1300  amoxicillin-clavulanate (AUGMENTIN) 875-125 MG per tablet 1 tablet  Status:  Discontinued     1 tablet Oral Every 12 hours 08/23/15 1215 08/25/15 0753   08/21/15 1600  vancomycin (VANCOCIN) 1,500 mg in sodium chloride 0.9 % 500 mL IVPB  Status:  Discontinued     1,500 mg 250 mL/hr over 120 Minutes Intravenous Every 24 hours 08/20/15 1305 08/23/15 1215   08/20/15 2200  piperacillin-tazobactam (ZOSYN) IVPB 3.375 g  Status:  Discontinued     3.375 g 12.5 mL/hr over 240 Minutes Intravenous Every 8 hours 08/20/15 1305 08/23/15 1215   08/20/15 1245  piperacillin-tazobactam (ZOSYN) IVPB 3.375 g     3.375 g 100 mL/hr over 30 Minutes Intravenous STAT 08/20/15 1238 08/20/15 1334   08/20/15 1245  vancomycin (VANCOCIN) 2,000 mg in sodium chloride 0.9 % 500 mL IVPB     2,000 mg 250 mL/hr over 120 Minutes Intravenous  Once 08/20/15 1240 08/20/15 1532      HPI/Subjective: Feels better, breathing   Objective: Filed Vitals:   08/25/15 2047 08/25/15 2118 08/26/15 0555 08/26/15 0842  BP:  159/81 170/87   Pulse:  74 59   Temp:  97.7 F (36.5 C) 97.7 F (36.5 C)   TempSrc:  Oral Oral   Resp:  18 20   Height:      Weight:      SpO2: 93% 92% 92% 96%    Intake/Output Summary (Last 24 hours) at 08/26/15 1156 Last data filed at 08/26/15  0846  Gross per 24 hour  Intake    480 ml  Output   1476 ml  Net   -996 ml   Filed Weights   08/20/15 1307 08/20/15 1635  Weight: 127.007 kg (280 lb) 131.1 kg (289 lb 0.4 oz)    Exam:   General:  Awake, laying in bed, in nad  Cardiovascular: regular, s1, s2/RRR  Respiratory: ronchi at bases  Abdomen: soft, obese, nonidstended, pos BS  Musculoskeletal: perfused, no clubbing, no cyanosis  Data Reviewed: Basic Metabolic Panel:  Recent Labs Lab 08/22/15 0511 08/23/15 0531 08/24/15 0520 08/25/15 0514 08/26/15 0510  NA 142 143 142 145 146*  K 4.8 4.5 4.7 4.9 5.0  CL 103 105 102 104 102  CO2 33* 31 32 34* 36*  GLUCOSE 167* 162* 139* 114* 149*  BUN 50* 56* 62* 62* 61*  CREATININE 2.66* 2.59* 2.58* 2.49* 2.35*  CALCIUM 8.5* 8.6* 8.6* 9.2 9.6   Liver Function Tests:  Recent Labs Lab 08/20/15 1147 08/21/15 0350  AST 11* 13*  ALT 19 18  ALKPHOS 80 76  BILITOT 0.4 0.5  PROT 6.3* 6.1*  ALBUMIN 2.9* 2.7*   No results for input(s): LIPASE, AMYLASE in the last 168 hours. No results for input(s): AMMONIA in the last 168 hours. CBC:  Recent Labs Lab 08/20/15 1147  08/22/15 0511 08/23/15 0531 08/24/15 0520 08/25/15 0514 08/26/15 0510  WBC 23.7*  < > 22.6* 19.5* 14.2* 21.7* 19.7*  NEUTROABS 20.7*  --   --   --   --   --   --   HGB 9.3*  < > 8.5* 8.7* 8.9* 9.1* 8.9*  HCT 32.5*  < > 29.6* 30.9* 30.0* 31.9* 31.3*  MCV 85.1  < > 83.6 84.4 83.8 84.6 84.8  PLT 275  < > 269 298 266 260 240  < > = values in this interval not displayed. Cardiac Enzymes: No results for input(s): CKTOTAL, CKMB, CKMBINDEX, TROPONINI in the last 168 hours. BNP (last 3 results)  Recent Labs  04/18/15 1554 08/09/15 0746  BNP 496.3* 657.8*    ProBNP (last 3 results) No results for input(s): PROBNP in the last 8760 hours.  CBG: No results for input(s): GLUCAP in the last 168 hours.  Recent Results (from the past 240 hour(s))  Urine culture     Status: None   Collection Time:  08/20/15 11:22 AM  Result Value Ref Range Status   Specimen Description URINE, CLEAN CATCH  Final   Special Requests NONE  Final   Culture   Final    2,000 COLONIES/mL INSIGNIFICANT GROWTH Performed at [facility]    Report Status 08/21/2015 FINAL  Final  Culture, blood (routine x 2)     Status: None   Collection Time: 08/20/15 11:45 AM  Result Value Ref Range Status   Specimen Description BLOOD RIGHT HAND  Final   Special Requests BOTTLES DRAWN AEROBIC  AND ANAEROBIC 5ML  Final   Culture   Final    NO GROWTH 5 DAYS Performed at [facility]    Report Status 08/25/2015 FINAL  Final  Culture, blood (routine x 2)     Status: None   Collection Time: 08/20/15 11:55 AM  Result Value Ref Range Status   Specimen Description BLOOD LEFT HAND  Final   Special Requests BOTTLES DRAWN AEROBIC AND ANAEROBIC 5ML  Final   Culture   Final    NO GROWTH 5 DAYS Performed at [facility]    Report Status 08/25/2015 FINAL  Final  MRSA PCR Screening     Status: None   Collection Time: 08/20/15  4:41 PM  Result Value Ref Range Status   MRSA by PCR NEGATIVE NEGATIVE Final    Comment:        The GeneXpert MRSA Assay (FDA approved for NASAL specimens only), is one component of a comprehensive MRSA colonization surveillance program. It is not intended to diagnose MRSA infection nor to guide or monitor treatment for MRSA infections.      Studies: Dg Chest Port 1 View  08/25/2015  CLINICAL DATA:  Follow-up aspiration pneumonia. Cough, congestion, shortness of Breath EXAM: PORTABLE CHEST 1 VIEW COMPARISON:  08/20/2015 FINDINGS: Severe patchy bilateral airspace disease again noted, increasing throughout the right lung, stable on the left. Possible small effusions. There is cardiomegaly. No acute bony abnormality. IMPRESSION: Severe patchy bilateral airspace disease again noted, worsening on the right since prior study. Suspect small effusions. Electronically Signed   By: Rolm Baptise M.D.   On: 08/25/2015 08:58    Scheduled Meds: . amLODipine  10 mg Oral q morning - 10a  . aspirin EC  81 mg Oral q morning - 10a  . docusate sodium  100 mg Oral Daily  . gabapentin  300 mg Oral QHS  . galantamine  8 mg Oral BID WC  . heparin  5,000 Units Subcutaneous 3 times per day  . ipratropium-albuterol  3 mL Nebulization QID  . levothyroxine  125 mcg Oral QAC breakfast  . methylPREDNISolone (SOLU-MEDROL) injection  40 mg Intravenous Q12H  . metoprolol  100 mg Oral BID  . pantoprazole  40 mg Oral BID  . piperacillin-tazobactam  3.375 g Intravenous Q8H  . polyvinyl alcohol  1 drop Both Eyes TID  . pravastatin  20 mg Oral QHS  . QUEtiapine  125 mg Oral QHS  . sertraline  150 mg Oral QHS  . sodium chloride  3 mL Intravenous Q12H  . tamsulosin  0.4 mg Oral Daily  . vancomycin  1,500 mg Intravenous Q24H   Continuous Infusions:   Principal Problem:   Acute respiratory failure with hypoxia (HCC) Active Problems:   Essential hypertension   Oropharynx cancer (HCC)   CKD (chronic kidney disease) stage 4, GFR 15-29 ml/min (HCC)   Obesity hypoventilation syndrome (HCC)   HCAP (healthcare-associated pneumonia)   [facility]  [facility] Pager [phone]. If 7PM-7AM, please contact night-coverage at [email or web address], password [facility] 08/26/2015, 11:56 AM  LOS: 6 days

## 2015-08-27 ENCOUNTER — Inpatient Hospital Stay (HOSPITAL_COMMUNITY): Payer: Medicare Other

## 2015-08-27 ENCOUNTER — Ambulatory Visit: Payer: Medicare Other | Admitting: Pulmonary Disease

## 2015-08-27 LAB — BASIC METABOLIC PANEL
ANION GAP: 8 (ref 5–15)
Anion gap: 7 (ref 5–15)
BUN: 73 mg/dL — ABNORMAL HIGH (ref 6–20)
BUN: 71 mg/dL — ABNORMAL HIGH (ref 6–20)
CHLORIDE: 102 mmol/L (ref 101–111)
CHLORIDE: 103 mmol/L (ref 101–111)
CO2: 37 mmol/L — ABNORMAL HIGH (ref 22–32)
CO2: 37 mmol/L — ABNORMAL HIGH (ref 22–32)
Calcium: 9.1 mg/dL (ref 8.9–10.3)
Calcium: 8.9 mg/dL (ref 8.9–10.3)
Creatinine, Ser: 2.55 mg/dL — ABNORMAL HIGH (ref 0.61–1.24)
Creatinine, Ser: 2.61 mg/dL — ABNORMAL HIGH (ref 0.61–1.24)
GFR, EST AFRICAN AMERICAN: 28 mL/min — AB (ref >60)
GFR, EST AFRICAN AMERICAN: 27 mL/min — AB (ref >60)
GFR, EST NON AFRICAN AMERICAN: 24 mL/min — AB (ref >60)
GFR, EST NON AFRICAN AMERICAN: 23 mL/min — AB (ref >60)
Glucose, Bld: 146 mg/dL — ABNORMAL HIGH (ref 65–99)
Glucose, Bld: 166 mg/dL — ABNORMAL HIGH (ref 65–99)
POTASSIUM: 5.5 mmol/L — AB (ref 3.5–5.1)
POTASSIUM: 5.3 mmol/L — AB (ref 3.5–5.1)
SODIUM: 147 mmol/L — AB (ref 135–145)
SODIUM: 147 mmol/L — AB (ref 135–145)

## 2015-08-27 LAB — CBC
HCT: 31.4 % — ABNORMAL LOW (ref 39.0–52.0)
HEMOGLOBIN: 8.9 g/dL — AB (ref 13.0–17.0)
MCH: 24.5 pg — ABNORMAL LOW (ref 26.0–34.0)
MCHC: 28.3 g/dL — ABNORMAL LOW (ref 30.0–36.0)
MCV: 86.5 fL (ref 78.0–100.0)
Platelets: 256 10*3/uL (ref 150–400)
RBC: 3.63 MIL/uL — AB (ref 4.22–5.81)
RDW: 17.4 % — ABNORMAL HIGH (ref 11.5–15.5)
WBC: 19.2 10*3/uL — AB (ref 4.0–10.5)

## 2015-08-27 MED ORDER — PREDNISONE 50 MG PO TABS
50.0000 mg | ORAL_TABLET | Freq: Every day | ORAL | Status: DC
  Administered 2015-08-27 – 2015-08-29 (×3): 50 mg via ORAL
  Filled 2015-08-27 (×4): qty 1

## 2015-08-27 MED ORDER — CEFPODOXIME PROXETIL 200 MG PO TABS
200.0000 mg | ORAL_TABLET | Freq: Two times a day (BID) | ORAL | Status: DC
  Administered 2015-08-27 – 2015-08-29 (×5): 200 mg via ORAL
  Filled 2015-08-27 (×6): qty 1

## 2015-08-27 MED ORDER — METRONIDAZOLE 500 MG PO TABS
500.0000 mg | ORAL_TABLET | Freq: Three times a day (TID) | ORAL | Status: DC
  Administered 2015-08-27 – 2015-08-29 (×6): 500 mg via ORAL
  Filled 2015-08-27 (×9): qty 1

## 2015-08-27 MED ORDER — SODIUM POLYSTYRENE SULFONATE 15 GM/60ML PO SUSP
15.0000 g | Freq: Once | ORAL | Status: AC
  Administered 2015-08-27: 15 g via ORAL
  Filled 2015-08-27: qty 60

## 2015-08-27 NOTE — Progress Notes (Signed)
[id number]/chart review for loc and dc planning/Vashti Bolanos,RN,BSN,CCM

## 2015-08-27 NOTE — Telephone Encounter (Signed)
When does he want to come in to be seen. ?

## 2015-08-27 NOTE — Telephone Encounter (Signed)
Pt cancelled HFU 08/27/15 with VS (in a DB slot) Requesting to be rescheduled.  Dr Halford Chessman is not in the office any more this month.  The patient will have to see TP for this f/u. LMTCB x 1

## 2015-08-27 NOTE — Progress Notes (Signed)
NUTRITION NOTE  RD received voicemail from RN from last night stating that pt's wife was requesting information pertaining to appropriate foods and drinks on current diet: Dysphagia 3 with nectar-thick liquids.   Pt sleeping and unarousable to name call x3 at time of RD visit and no family/visitors present in the room. Left handouts for pt/pt's wife from the [employer] outlining foods and liquids that are and are not appropriate on current diet.   Please re-consult if further nutrition-related needs arise.    Jarome Matin, RD, LDN Inpatient Clinical Dietitian Pager # [phone] After hours/weekend pager # [phone]

## 2015-08-27 NOTE — Progress Notes (Signed)
[facility] PROGRESS NOTE  Wesley [name] [id number] DOB: March 28, 1946 DOA: 08/20/2015 PCP: [name], MD  HPI/Brief narrative 69 y.o. 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 On the day of admission, pt noted to have increasing sob and hypoxia with increased O2 requirements (up to 11L NRB) and temp of 100.43F. Pt subsequently presented to ED where CXR was notable for B diffuse airspace disease sparing the RUL that has progressed since prior xray. Patient was started on empiric vanc and zosyn and hospitalist consulted for consideration for admission  Assessment/Plan: 1. Acute respiratory failure with hypoxia secondary to aspiration pneumonia -on admission, initially required [facility] in the setting of recurrent aspiration PNA  -uses 3LNC at baseline -see below  2. 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 -started back on IV vancomycin and Zosyn 12/12 will change to oral cefpodoxime and flagyl today, discussed with PharmD -repeat CXr with improvement, no wheezing, change steroids to PO -continue compensatory measures for swallowing  3. HTN -stable  4. CKD stage 4 -stable  5. Hx Dysphagia -see discussion above -increase protonix to BID  DVT proph: Hep SQ  Code Status: Full Family Communication: wife  Disposition Plan:   Home tomorrow if stable   Consultants:    Procedures:    Antibiotics: Anti-infectives    Start     Dose/Rate Route Frequency Ordered Stop   08/27/15 1200  cefpodoxime (VANTIN) tablet 200 mg     200 mg Oral Every 12 hours 08/27/15 1055     08/27/15 1200  metroNIDAZOLE (FLAGYL) tablet 500 mg     500 mg Oral 3 times per day 08/27/15 1055     08/26/15 0800  vancomycin (VANCOCIN) 1,500 mg in sodium chloride 0.9 % 500 mL IVPB  Status:  Discontinued     1,500 mg 250 mL/hr over 120 Minutes Intravenous Every 24 hours 08/25/15 0838 08/27/15 1054   08/25/15 0900  vancomycin (VANCOCIN) 2,000 mg in sodium chloride 0.9 % 500 mL IVPB     2,000 mg 250 mL/hr over 120 Minutes Intravenous  Once 08/25/15 0838 08/25/15 1225   08/25/15 0800  piperacillin-tazobactam (ZOSYN) IVPB 3.375 g  Status:  Discontinued     3.375 g 12.5 mL/hr over 240 Minutes Intravenous Every 8 hours 08/25/15 0753 08/27/15 1054   08/23/15 1300  amoxicillin-clavulanate (AUGMENTIN) 875-125 MG per tablet 1 tablet  Status:  Discontinued     1 tablet Oral Every 12 hours 08/23/15 1215 08/25/15 0753   08/21/15 1600  vancomycin (VANCOCIN) 1,500 mg in sodium chloride 0.9 % 500 mL IVPB  Status:  Discontinued     1,500 mg 250 mL/hr over 120 Minutes Intravenous Every 24 hours 08/20/15 1305 08/23/15 1215   08/20/15 2200  piperacillin-tazobactam (ZOSYN) IVPB 3.375 g  Status:  Discontinued     3.375 g 12.5 mL/hr over 240 Minutes Intravenous Every 8 hours 08/20/15 1305 08/23/15 1215   08/20/15  1245  piperacillin-tazobactam (ZOSYN) IVPB 3.375 g     3.375 g 100 mL/hr over 30 Minutes Intravenous STAT 08/20/15 1238 08/20/15 1334   08/20/15 1245  vancomycin (VANCOCIN) 2,000 mg in sodium chloride 0.9 % 500 mL IVPB     2,000 mg 250 mL/hr over 120 Minutes Intravenous  Once 08/20/15 1240 08/20/15 1532      HPI/Subjective: Feels better, breathing   Objective: Filed Vitals:   08/27/15 0515 08/27/15 0735 08/27/15 1208 08/27/15 1238  BP: 139/68    143/64  Pulse: 58   68  Temp: 97.3 F (36.3 C)   97.7 F (36.5 C)  TempSrc: Oral   Oral  Resp: 18   20  Height:      Weight:      SpO2: 90% 91% 90%     Intake/Output Summary (Last 24 hours) at 08/27/15 1530 Last data filed at 08/27/15 1240  Gross per 24 hour  Intake    820 ml  Output   2425 ml  Net  -1605 ml   Filed Weights   08/20/15 1307 08/20/15 1635  Weight: 127.007 kg (280 lb) 131.1 kg (289 lb 0.4 oz)    Exam:   General:  Awake, laying in bed, in nad  Cardiovascular: regular, s1, s2/RRR  Respiratory: ronchi at bases  Abdomen: soft, obese, nonidstended, pos BS  Musculoskeletal: perfused, no clubbing, no cyanosis  Data Reviewed: Basic Metabolic Panel:  Recent Labs Lab 08/23/15 0531 08/24/15 0520 08/25/15 0514 08/26/15 0510 08/27/15 0555  NA 143 142 145 146* 147*  K 4.5 4.7 4.9 5.0 5.5*  CL 105 102 104 102 102  CO2 31 32 34* 36* 37*  GLUCOSE 162* 139* 114* 149* 146*  BUN 56* 62* 62* 61* 73*  CREATININE 2.59* 2.58* 2.49* 2.35* 2.55*  CALCIUM 8.6* 8.6* 9.2 9.6 9.1   Liver Function Tests:  Recent Labs Lab 08/21/15 0350  AST 13*  ALT 18  ALKPHOS 76  BILITOT 0.5  PROT 6.1*  ALBUMIN 2.7*   No results for input(s): LIPASE, AMYLASE in the last 168 hours. No results for input(s): AMMONIA in the last 168 hours. CBC:  Recent Labs Lab 08/23/15 0531 08/24/15 0520 08/25/15 0514 08/26/15 0510 08/27/15 0555  WBC 19.5* 14.2* 21.7* 19.7* 19.2*  HGB 8.7* 8.9* 9.1* 8.9* 8.9*  HCT 30.9* 30.0* 31.9* 31.3* 31.4*  MCV 84.4 83.8 84.6 84.8 86.5  PLT 298 266 260 240 256   Cardiac Enzymes: No results for input(s): CKTOTAL, CKMB, CKMBINDEX, TROPONINI in the last 168 hours. BNP (last 3 results)  Recent Labs  04/18/15 1554 08/09/15 0746  BNP 496.3* 657.8*    ProBNP (last 3 results) No results for input(s): PROBNP in the last 8760 hours.  CBG: No results for input(s): GLUCAP in the last 168 hours.  Recent Results (from the past 240 hour(s))   Urine culture     Status: None   Collection Time: 08/20/15 11:22 AM  Result Value Ref Range Status   Specimen Description URINE, CLEAN CATCH  Final   Special Requests NONE  Final   Culture   Final    2,000 COLONIES/mL INSIGNIFICANT GROWTH Performed at [facility]    Report Status 08/21/2015 FINAL  Final  Culture, blood (routine x 2)     Status: None   Collection Time: 08/20/15 11:45 AM  Result Value Ref Range Status   Specimen Description BLOOD RIGHT HAND  Final   Special Requests BOTTLES DRAWN AEROBIC AND ANAEROBIC 5ML  Final  Culture   Final    NO GROWTH 5 DAYS Performed at [facility]    Report Status 08/25/2015 FINAL  Final  Culture, blood (routine x 2)     Status: None   Collection Time: 08/20/15 11:55 AM  Result Value Ref Range Status   Specimen Description BLOOD LEFT HAND  Final   Special Requests BOTTLES DRAWN AEROBIC AND ANAEROBIC 5ML  Final   Culture   Final    NO GROWTH 5 DAYS Performed at [facility]    Report Status 08/25/2015 FINAL  Final  MRSA PCR Screening     Status: None   Collection Time: 08/20/15  4:41 PM  Result Value Ref Range Status   MRSA by PCR NEGATIVE NEGATIVE Final    Comment:        The GeneXpert MRSA Assay (FDA approved for NASAL specimens only), is one component of a comprehensive MRSA colonization surveillance program. It is not intended to diagnose MRSA infection nor to guide or monitor treatment for MRSA infections.      Studies: Dg Chest 2 View  08/27/2015  CLINICAL DATA:  Cough. Aspiration pneumonia. Acute respiratory failure with hypoxia. Chronic kidney disease stage 4. Oropharyngeal Carcinoma. EXAM: CHEST  2 VIEW COMPARISON:  08/25/2015 FINDINGS: There has been interval improvement in diffuse bilateral airspace disease since previous study. Probable small left subpulmonic pleural effusion again noted. No pneumothorax visualized. Heart size is stable. IMPRESSION: Interval improvement in diffuse bilateral  airspace disease since previous study. Electronically Signed   By: Earle Gell M.D.   On: 08/27/2015 08:55    Scheduled Meds: . amLODipine  10 mg Oral q morning - 10a  . aspirin EC  81 mg Oral q morning - 10a  . cefpodoxime  200 mg Oral Q12H  . docusate sodium  100 mg Oral Daily  . gabapentin  300 mg Oral QHS  . galantamine  8 mg Oral BID WC  . heparin  5,000 Units Subcutaneous 3 times per day  . ipratropium-albuterol  3 mL Nebulization QID  . levothyroxine  125 mcg Oral QAC breakfast  . metoprolol  100 mg Oral BID  . metroNIDAZOLE  500 mg Oral 3 times per day  . pantoprazole  40 mg Oral BID  . polyvinyl alcohol  1 drop Both Eyes TID  . pravastatin  20 mg Oral QHS  . predniSONE  50 mg Oral Q breakfast  . QUEtiapine  125 mg Oral QHS  . sertraline  150 mg Oral QHS  . sodium chloride  3 mL Intravenous Q12H  . tamsulosin  0.4 mg Oral Daily   Continuous Infusions:   Principal Problem:   Acute respiratory failure with hypoxia (HCC) Active Problems:   Essential hypertension   Oropharynx cancer (HCC)   CKD (chronic kidney disease) stage 4, GFR 15-29 ml/min (HCC)   Obesity hypoventilation syndrome ([place])   HCAP (healthcare-associated pneumonia)   [facility]  [facility] Pager [phone]. If 7PM-7AM, please contact night-coverage at [email or web address], password [facility] 08/27/2015, 3:30 PM  LOS: 7 days

## 2015-08-27 NOTE — Telephone Encounter (Signed)
Per Dr. Broadus John he needs to be seen no later than 2 weeks after he is discharged from the hospital, which should be tomorrow.

## 2015-08-27 NOTE — Telephone Encounter (Signed)
Spoke with pt's wife. She is aware that VS is not in the office the rest of the month. Offered to make an appointment with TP for her next available. Was told that she was told by Dr. Broadus John that if we couldn't accommodate them then we needed to speak to TP and tell her that Dr. Broadus John said he needs to be worked in.  TP - please advise. Thanks.

## 2015-08-27 NOTE — Telephone Encounter (Signed)
Patient Returned call [phone]. Please call this number only not number in chart

## 2015-08-28 DIAGNOSIS — N184 Chronic kidney disease, stage 4 (severe): Secondary | ICD-10-CM

## 2015-08-28 LAB — BASIC METABOLIC PANEL
Anion gap: 8 (ref 5–15)
BUN: 71 mg/dL — AB (ref 6–20)
CHLORIDE: 103 mmol/L (ref 101–111)
CO2: 37 mmol/L — ABNORMAL HIGH (ref 22–32)
CREATININE: 2.53 mg/dL — AB (ref 0.61–1.24)
Calcium: 8.9 mg/dL (ref 8.9–10.3)
GFR calc Af Amer: 28 mL/min — ABNORMAL LOW (ref >60)
GFR calc non Af Amer: 24 mL/min — ABNORMAL LOW (ref >60)
GLUCOSE: 130 mg/dL — AB (ref 65–99)
Potassium: 4.9 mmol/L (ref 3.5–5.1)
SODIUM: 148 mmol/L — AB (ref 135–145)

## 2015-08-28 LAB — CBC
HEMATOCRIT: 31.1 % — AB (ref 39.0–52.0)
Hemoglobin: 8.8 g/dL — ABNORMAL LOW (ref 13.0–17.0)
MCH: 24.7 pg — AB (ref 26.0–34.0)
MCHC: 28.3 g/dL — AB (ref 30.0–36.0)
MCV: 87.4 fL (ref 78.0–100.0)
PLATELETS: 251 10*3/uL (ref 150–400)
RBC: 3.56 MIL/uL — ABNORMAL LOW (ref 4.22–5.81)
RDW: 17.6 % — AB (ref 11.5–15.5)
WBC: 18.7 10*3/uL — AB (ref 4.0–10.5)

## 2015-08-28 NOTE — Progress Notes (Signed)
PHARMACY NOTE - Renal Dose Adjustment  Pharmacy has been assisting with dosing of antibiotics for aspiration PNA. Dosage remains stable at [facility] 200mg  PO BID and Flagyl 500mg  PO q8h and need for further dosage adjustment appears unlikely at present.    Will sign off at this time.  Please reconsult if a change in clinical status warrants re-evaluation of dosage.  Thank you, Netta Cedars, PharmD, BCPS Pager: [phone] 08/28/2015@8 :42 AM

## 2015-08-28 NOTE — Telephone Encounter (Signed)
TP please advise when we can scheduled the pt.

## 2015-08-28 NOTE — Progress Notes (Signed)
Physical Therapy Treatment Patient Details Name: Wesley [name] MRN: [id number] DOB: 04-07-1946 Today's Date: 08/28/2015    History of Present Illness 69 y.o. male with a hx of CKD4, htn, obesity hypoventilation syndrome on chronic home O2 at 3L, oropharyngeal cancer ; recently discharged for HCAP on 12/1; On admission - 08/20/15 pt with  increasing sob and hypoxia with increased O2 requirements (up to 11L NRB) and temp of 100.11F.     PT Comments    Participated well with session. O2 sats dropped to 80% on 3L O2 during session. Seated rest break and briefly increased O2 to 4L to aid recovery-sats returned to 91%. Rechecked sats end of session-~88-91% on 3L O2 at rest. Made RN aware. Pt states he hopes to d/c home today.   Follow Up Recommendations  Supervision - Intermittent;No PT follow up     Equipment Recommendations  None recommended by PT    Recommendations for Other Services       Precautions / Restrictions Precautions Precautions: Fall Precaution Comments: O2 dep Restrictions Weight Bearing Restrictions: No    Mobility  Bed Mobility Overal bed mobility: Modified Independent             General bed mobility comments: Increased time and HOB elevated  Transfers Overall transfer level: Needs assistance Equipment used: Rolling walker (2 wheeled) Transfers: Sit to/from Stand Sit to Stand: Min guard         General transfer comment: close guard for safety  Ambulation/Gait Ambulation/Gait assistance: Min guard Ambulation Distance (Feet): 85 Feet Assistive device: Straight cane Gait Pattern/deviations: Wide base of support;Step-through pattern;Decreased stride length     General Gait Details: Sats dropped to 80% on 3L O2 after ambulation. Increased to 4L to aid recovery while resting (seated)-sats returned to 91%   Stairs            Wheelchair Mobility    Modified Rankin (Stroke Patients Only)       Balance                                     Cognition Arousal/Alertness: Awake/alert Behavior During Therapy: WFL for tasks assessed/performed Overall Cognitive Status: Within Functional Limits for tasks assessed                      Exercises      General Comments        Pertinent Vitals/Pain Pain Assessment: No/denies pain    Home Living                      Prior Function            PT Goals (current goals can now be found in the care plan section) Progress towards PT goals: Progressing toward goals    Frequency  Min 3X/week    PT Plan Current plan remains appropriate    Co-evaluation             End of Session Equipment Utilized During Treatment: Gait belt;Oxygen Activity Tolerance: Patient tolerated treatment well Patient left: in chair;with call bell/phone within reach;with chair alarm set     Time: [id number] PT Time Calculation (min) (ACUTE ONLY): 36 min  Charges:  $Gait Training: 8-22 mins $Therapeutic Activity: 8-22 mins                    G Codes:  Weston Anna, MPT Pager: [phone]

## 2015-08-28 NOTE — Telephone Encounter (Signed)
Patient sched, nothing further needed, did not want to play phone tag with nurse

## 2015-08-28 NOTE — Telephone Encounter (Signed)
Pt wife cb about scheduling appt [phone]

## 2015-08-28 NOTE — Care Management Important Message (Signed)
Important Message  Patient Details  Name: ARMAAN RICHEL MRN: [id number] Date of Birth: 1945/10/08   Medicare Important Message Given:  Yes    Camillo Flaming 08/28/2015, 9:04 AMImportant Message  Patient Details  Name: DRAKKAR SERGIO MRN: [id number] Date of Birth: [birth year]   Medicare Important Message Given:  Yes    Camillo Flaming 08/28/2015, 9:03 AM

## 2015-08-28 NOTE — Progress Notes (Signed)
[facility] PROGRESS NOTE  Wesley [name] [id number] DOB: November 08, 1945 DOA: 08/20/2015 PCP: [name], MD  HPI/Brief narrative 69 y.o. 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 On the day of admission, pt noted to have increasing sob and hypoxia with increased O2 requirements (up to 11L NRB) and temp of 100.45F. Pt subsequently presented to ED where CXR was notable for B diffuse airspace disease sparing the RUL that has progressed since prior xray. Patient was started on empiric vanc and zosyn and hospitalist consulted for consideration for admission  Assessment/Plan: 1. Acute respiratory failure with hypoxia secondary to aspiration pneumonia -on admission, initially required [facility] in the setting of recurrent aspiration PNA  -uses 3LNC at baseline -see below  2. 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 -started back on IV vancomycin and Zosyn 12/12,repeat CXr with some improvement, no wheezing, changed steroids to PO 12/15 and Abx changed to oral cefpodoxime and flagyl yesterday, discussed with PharmD -continue compensatory measures for swallowing -home tomorrow if stable, WBC 18K today, very slow improvement and high risk of decompensation  3. HTN -stable  4. CKD stage 4 -stable  5. Hx Dysphagia -see discussion  above -increased protonix to BID  DVT proph: Hep SQ  Code Status: Full Family Communication: none at bedside, d/w wife yesterday Disposition Plan:  Home tomorrow if stable   Consultants:    Procedures:    Antibiotics: Anti-infectives    Start     Dose/Rate Route Frequency Ordered Stop   08/27/15 1200  cefpodoxime (VANTIN) tablet 200 mg     200 mg Oral Every 12 hours 08/27/15 1055     08/27/15 1200  metroNIDAZOLE (FLAGYL) tablet 500 mg     500 mg Oral 3 times per day 08/27/15 1055     08/26/15 0800  vancomycin (VANCOCIN) 1,500 mg in sodium chloride 0.9 % 500 mL IVPB  Status:  Discontinued     1,500 mg 250 mL/hr over 120 Minutes Intravenous Every 24 hours 08/25/15 0838 08/27/15 1054   08/25/15 0900  vancomycin (VANCOCIN) 2,000 mg in sodium chloride 0.9 % 500 mL IVPB     2,000 mg 250 mL/hr over 120 Minutes Intravenous  Once 08/25/15 0838 08/25/15 1225   08/25/15 0800  piperacillin-tazobactam (ZOSYN) IVPB 3.375 g  Status:  Discontinued     3.375 g 12.5 mL/hr over 240 Minutes Intravenous Every 8 hours 08/25/15 0753 08/27/15 1054   08/23/15 1300  amoxicillin-clavulanate (AUGMENTIN) 875-125 MG per tablet 1 tablet  Status:  Discontinued     1 tablet Oral Every 12 hours 08/23/15 1215 08/25/15 0753   08/21/15 1600  vancomycin (VANCOCIN) 1,500 mg in sodium chloride 0.9 % 500 mL IVPB  Status:  Discontinued     1,500 mg 250 mL/hr over 120 Minutes Intravenous Every 24 hours 08/20/15 1305 08/23/15 1215   08/20/15 2200  piperacillin-tazobactam (ZOSYN) IVPB 3.375 g  Status:  Discontinued  3.375 g 12.5 mL/hr over 240 Minutes Intravenous Every 8 hours 08/20/15 1305 08/23/15 1215   08/20/15 1245  piperacillin-tazobactam (ZOSYN) IVPB 3.375 g     3.375 g 100 mL/hr over 30 Minutes Intravenous STAT 08/20/15 1238 08/20/15 1334   08/20/15 1245  vancomycin (VANCOCIN) 2,000 mg in sodium chloride 0.9 % 500 mL IVPB     2,000 mg 250 mL/hr over 120 Minutes Intravenous  Once 08/20/15 1240 08/20/15  1532      HPI/Subjective: Feels better, breathing improving  Objective: Filed Vitals:   08/28/15 0457 08/28/15 0805 08/28/15 1148 08/28/15 1330  BP: 174/83   148/64  Pulse: 60   60  Temp: 97.6 F (36.4 C)   97.7 F (36.5 C)  TempSrc: Oral   Oral  Resp: 22   20  Height:      Weight:      SpO2: 93% 91% 91% 91%    Intake/Output Summary (Last 24 hours) at 08/28/15 1418 Last data filed at 08/28/15 0842  Gross per 24 hour  Intake    240 ml  Output    350 ml  Net   -110 ml   Filed Weights   08/20/15 1307 08/20/15 1635  Weight: 127.007 kg (280 lb) 131.1 kg (289 lb 0.4 oz)    Exam:   General:  Awake, alert, oriented x3, no distress  Cardiovascular: regular, s1, s2/RRR  Respiratory: ronchi at bases, no wheezing today  Abdomen: soft, obese, nonidstended, pos BS  Musculoskeletal: perfused, no clubbing, no cyanosis  Data Reviewed: Basic Metabolic Panel:  Recent Labs Lab 08/25/15 0514 08/26/15 0510 08/27/15 0555 08/27/15 1719 08/28/15 0520  NA 145 146* 147* 147* 148*  K 4.9 5.0 5.5* 5.3* 4.9  CL 104 102 102 103 103  CO2 34* 36* 37* 37* 37*  GLUCOSE 114* 149* 146* 166* 130*  BUN 62* 61* 73* 71* 71*  CREATININE 2.49* 2.35* 2.55* 2.61* 2.53*  CALCIUM 9.2 9.6 9.1 8.9 8.9   Liver Function Tests: No results for input(s): AST, ALT, ALKPHOS, BILITOT, PROT, ALBUMIN in the last 168 hours. No results for input(s): LIPASE, AMYLASE in the last 168 hours. No results for input(s): AMMONIA in the last 168 hours. CBC:  Recent Labs Lab 08/24/15 0520 08/25/15 0514 08/26/15 0510 08/27/15 0555 08/28/15 0520  WBC 14.2* 21.7* 19.7* 19.2* 18.7*  HGB 8.9* 9.1* 8.9* 8.9* 8.8*  HCT 30.0* 31.9* 31.3* 31.4* 31.1*  MCV 83.8 84.6 84.8 86.5 87.4  PLT 266 260 240 256 251   Cardiac Enzymes: No results for input(s): CKTOTAL, CKMB, CKMBINDEX, TROPONINI in the last 168 hours. BNP (last 3 results)  Recent Labs  04/18/15 1554 08/09/15 0746  BNP 496.3* 657.8*    ProBNP (last  3 results) No results for input(s): PROBNP in the last 8760 hours.  CBG: No results for input(s): GLUCAP in the last 168 hours.  Recent Results (from the past 240 hour(s))  Urine culture     Status: None   Collection Time: 08/20/15 11:22 AM  Result Value Ref Range Status   Specimen Description URINE, CLEAN CATCH  Final   Special Requests NONE  Final   Culture   Final    2,000 COLONIES/mL INSIGNIFICANT GROWTH Performed at [facility]    Report Status 08/21/2015 FINAL  Final  Culture, blood (routine x 2)     Status: None   Collection Time: 08/20/15 11:45 AM  Result Value Ref Range Status   Specimen Description BLOOD RIGHT HAND  Final  Special Requests BOTTLES DRAWN AEROBIC AND ANAEROBIC 5ML  Final   Culture   Final    NO GROWTH 5 DAYS Performed at [facility]    Report Status 08/25/2015 FINAL  Final  Culture, blood (routine x 2)     Status: None   Collection Time: 08/20/15 11:55 AM  Result Value Ref Range Status   Specimen Description BLOOD LEFT HAND  Final   Special Requests BOTTLES DRAWN AEROBIC AND ANAEROBIC 5ML  Final   Culture   Final    NO GROWTH 5 DAYS Performed at [facility]    Report Status 08/25/2015 FINAL  Final  MRSA PCR Screening     Status: None   Collection Time: 08/20/15  4:41 PM  Result Value Ref Range Status   MRSA by PCR NEGATIVE NEGATIVE Final    Comment:        The GeneXpert MRSA Assay (FDA approved for NASAL specimens only), is one component of a comprehensive MRSA colonization surveillance program. It is not intended to diagnose MRSA infection nor to guide or monitor treatment for MRSA infections.      Studies: Dg Chest 2 View  08/27/2015  CLINICAL DATA:  Cough. Aspiration pneumonia. Acute respiratory failure with hypoxia. Chronic kidney disease stage 4. Oropharyngeal Carcinoma. EXAM: CHEST  2 VIEW COMPARISON:  08/25/2015 FINDINGS: There has been interval improvement in diffuse bilateral airspace disease since  previous study. Probable small left subpulmonic pleural effusion again noted. No pneumothorax visualized. Heart size is stable. IMPRESSION: Interval improvement in diffuse bilateral airspace disease since previous study. Electronically Signed   By: Earle Gell M.D.   On: 08/27/2015 08:55    Scheduled Meds: . amLODipine  10 mg Oral q morning - 10a  . aspirin EC  81 mg Oral q morning - 10a  . cefpodoxime  200 mg Oral Q12H  . docusate sodium  100 mg Oral Daily  . gabapentin  300 mg Oral QHS  . galantamine  8 mg Oral BID WC  . heparin  5,000 Units Subcutaneous 3 times per day  . ipratropium-albuterol  3 mL Nebulization QID  . levothyroxine  125 mcg Oral QAC breakfast  . metoprolol  100 mg Oral BID  . metroNIDAZOLE  500 mg Oral 3 times per day  . pantoprazole  40 mg Oral BID  . polyvinyl alcohol  1 drop Both Eyes TID  . pravastatin  20 mg Oral QHS  . predniSONE  50 mg Oral Q breakfast  . QUEtiapine  125 mg Oral QHS  . sertraline  150 mg Oral QHS  . sodium chloride  3 mL Intravenous Q12H  . tamsulosin  0.4 mg Oral Daily   Continuous Infusions:   Principal Problem:   Acute respiratory failure with hypoxia (HCC) Active Problems:   Essential hypertension   Oropharynx cancer (HCC)   CKD (chronic kidney disease) stage 4, GFR 15-29 ml/min (HCC)   Obesity hypoventilation syndrome ([place])   HCAP (healthcare-associated pneumonia)   [facility]  [facility] Pager [phone]. If 7PM-7AM, please contact night-coverage at [email or web address], password [facility] 08/28/2015, 2:18 PM  LOS: 8 days

## 2015-08-29 DIAGNOSIS — J690 Pneumonitis due to inhalation of food and vomit: Secondary | ICD-10-CM | POA: Insufficient documentation

## 2015-08-29 LAB — CBC
HCT: 32.8 % — ABNORMAL LOW (ref 39.0–52.0)
HEMOGLOBIN: 9.1 g/dL — AB (ref 13.0–17.0)
MCH: 24.5 pg — AB (ref 26.0–34.0)
MCHC: 27.7 g/dL — AB (ref 30.0–36.0)
MCV: 88.4 fL (ref 78.0–100.0)
Platelets: 218 10*3/uL (ref 150–400)
RBC: 3.71 MIL/uL — ABNORMAL LOW (ref 4.22–5.81)
RDW: 17.9 % — AB (ref 11.5–15.5)
WBC: 13.6 10*3/uL — ABNORMAL HIGH (ref 4.0–10.5)

## 2015-08-29 MED ORDER — METRONIDAZOLE 500 MG PO TABS: 500.0000 mg | ORAL_TABLET | Freq: Three times a day (TID) | ORAL | Status: DC

## 2015-08-29 MED ORDER — CEFPODOXIME PROXETIL 200 MG PO TABS: 200.0000 mg | ORAL_TABLET | Freq: Two times a day (BID) | ORAL | Status: DC

## 2015-08-29 MED ORDER — PREDNISONE 20 MG PO TABS: ORAL_TABLET | ORAL | Status: DC

## 2015-08-29 MED ORDER — RESOURCE THICKENUP CLEAR PO POWD: ORAL | Status: AC

## 2015-08-29 MED ORDER — PANTOPRAZOLE SODIUM 40 MG PO TBEC: 40.0000 mg | DELAYED_RELEASE_TABLET | Freq: Two times a day (BID) | ORAL | Status: AC

## 2015-08-29 NOTE — Progress Notes (Signed)
Pt d/c home with his spouse. Alert, oriented, and without c/o. Discharge instructions/prescriptions given/explained with pt and spouse verbalizing understanding.  Followup appointment noted. Pt left with home O2, glasses, and clothes.

## 2015-09-01 ENCOUNTER — Emergency Department (HOSPITAL_COMMUNITY): Payer: Medicare Other

## 2015-09-01 ENCOUNTER — Encounter (HOSPITAL_COMMUNITY): Payer: Self-pay | Admitting: *Deleted

## 2015-09-01 ENCOUNTER — Telehealth: Payer: Self-pay | Admitting: Pulmonary Disease

## 2015-09-01 ENCOUNTER — Inpatient Hospital Stay (HOSPITAL_COMMUNITY)
Admission: EM | Admit: 2015-09-01 | Discharge: 2015-09-08 | DRG: 871 | Disposition: A | Discharge status: Home Health | Payer: Medicare Other | Attending: Family Medicine | Admitting: Family Medicine

## 2015-09-01 DIAGNOSIS — J189 Pneumonia, unspecified organism: Secondary | ICD-10-CM

## 2015-09-01 DIAGNOSIS — D649 Anemia, unspecified: Secondary | ICD-10-CM | POA: Diagnosis present

## 2015-09-01 DIAGNOSIS — I350 Nonrheumatic aortic (valve) stenosis: Secondary | ICD-10-CM | POA: Diagnosis present

## 2015-09-01 DIAGNOSIS — Z79899 Other long term (current) drug therapy: Secondary | ICD-10-CM | POA: Diagnosis not present

## 2015-09-01 DIAGNOSIS — E875 Hyperkalemia: Secondary | ICD-10-CM | POA: Diagnosis present

## 2015-09-01 DIAGNOSIS — K224 Dyskinesia of esophagus: Secondary | ICD-10-CM | POA: Diagnosis not present

## 2015-09-01 DIAGNOSIS — J9621 Acute and chronic respiratory failure with hypoxia: Secondary | ICD-10-CM | POA: Diagnosis present

## 2015-09-01 DIAGNOSIS — N179 Acute kidney failure, unspecified: Secondary | ICD-10-CM | POA: Diagnosis present

## 2015-09-01 DIAGNOSIS — Z6841 Body Mass Index (BMI) 40.0 and over, adult: Secondary | ICD-10-CM

## 2015-09-01 DIAGNOSIS — I130 Hypertensive heart and chronic kidney disease with heart failure and stage 1 through stage 4 chronic kidney disease, or unspecified chronic kidney disease: Secondary | ICD-10-CM | POA: Diagnosis present

## 2015-09-01 DIAGNOSIS — R0600 Dyspnea, unspecified: Secondary | ICD-10-CM | POA: Diagnosis not present

## 2015-09-01 DIAGNOSIS — N184 Chronic kidney disease, stage 4 (severe): Secondary | ICD-10-CM | POA: Diagnosis present

## 2015-09-01 DIAGNOSIS — E039 Hypothyroidism, unspecified: Secondary | ICD-10-CM | POA: Diagnosis present

## 2015-09-01 DIAGNOSIS — Z7982 Long term (current) use of aspirin: Secondary | ICD-10-CM

## 2015-09-01 DIAGNOSIS — J690 Pneumonitis due to inhalation of food and vomit: Secondary | ICD-10-CM | POA: Diagnosis not present

## 2015-09-01 DIAGNOSIS — G92 Toxic encephalopathy: Secondary | ICD-10-CM | POA: Diagnosis present

## 2015-09-01 DIAGNOSIS — J9622 Acute and chronic respiratory failure with hypercapnia: Secondary | ICD-10-CM | POA: Diagnosis present

## 2015-09-01 DIAGNOSIS — Z7952 Long term (current) use of systemic steroids: Secondary | ICD-10-CM

## 2015-09-01 DIAGNOSIS — J9601 Acute respiratory failure with hypoxia: Secondary | ICD-10-CM | POA: Diagnosis not present

## 2015-09-01 DIAGNOSIS — Z85818 Personal history of malignant neoplasm of other sites of lip, oral cavity, and pharynx: Secondary | ICD-10-CM | POA: Diagnosis not present

## 2015-09-01 DIAGNOSIS — K219 Gastro-esophageal reflux disease without esophagitis: Secondary | ICD-10-CM | POA: Diagnosis present

## 2015-09-01 DIAGNOSIS — I5033 Acute on chronic diastolic (congestive) heart failure: Secondary | ICD-10-CM | POA: Diagnosis present

## 2015-09-01 DIAGNOSIS — Z9981 Dependence on supplemental oxygen: Secondary | ICD-10-CM

## 2015-09-01 DIAGNOSIS — G9340 Encephalopathy, unspecified: Secondary | ICD-10-CM | POA: Diagnosis not present

## 2015-09-01 DIAGNOSIS — J9690 Respiratory failure, unspecified, unspecified whether with hypoxia or hypercapnia: Secondary | ICD-10-CM

## 2015-09-01 DIAGNOSIS — E662 Morbid (severe) obesity with alveolar hypoventilation: Secondary | ICD-10-CM | POA: Diagnosis present

## 2015-09-01 DIAGNOSIS — A419 Sepsis, unspecified organism: Secondary | ICD-10-CM | POA: Diagnosis present

## 2015-09-01 DIAGNOSIS — Z8701 Personal history of pneumonia (recurrent): Secondary | ICD-10-CM

## 2015-09-01 DIAGNOSIS — J9602 Acute respiratory failure with hypercapnia: Secondary | ICD-10-CM | POA: Diagnosis not present

## 2015-09-01 DIAGNOSIS — R0602 Shortness of breath: Secondary | ICD-10-CM | POA: Diagnosis present

## 2015-09-01 DIAGNOSIS — E872 Acidosis: Secondary | ICD-10-CM | POA: Diagnosis present

## 2015-09-01 HISTORY — DX: Pulmonary hypertension, unspecified: I27.20

## 2015-09-01 HISTORY — DX: Hypothyroidism, unspecified: E03.9

## 2015-09-01 HISTORY — DX: Nonrheumatic aortic (valve) stenosis: I35.0

## 2015-09-01 LAB — CBC WITH DIFFERENTIAL/PLATELET
BASOS PCT: 0 %
Basophils Absolute: 0 10*3/uL (ref 0.0–0.1)
EOS ABS: 0.1 10*3/uL (ref 0.0–0.7)
Eosinophils Relative: 1 %
HCT: 30.5 % — ABNORMAL LOW (ref 39.0–52.0)
Hemoglobin: 8.6 g/dL — ABNORMAL LOW (ref 13.0–17.0)
LYMPHS ABS: 0.8 10*3/uL (ref 0.7–4.0)
Lymphocytes Relative: 4 %
MCH: 25.3 pg — AB (ref 26.0–34.0)
MCHC: 28.2 g/dL — AB (ref 30.0–36.0)
MCV: 89.7 fL (ref 78.0–100.0)
Monocytes Absolute: 1.4 10*3/uL — ABNORMAL HIGH (ref 0.1–1.0)
Monocytes Relative: 7 %
NEUTROS PCT: 88 %
Neutro Abs: 17 10*3/uL — ABNORMAL HIGH (ref 1.7–7.7)
PLATELETS: 175 10*3/uL (ref 150–400)
RBC: 3.4 MIL/uL — ABNORMAL LOW (ref 4.22–5.81)
RDW: 18.1 % — AB (ref 11.5–15.5)
WBC: 19.4 10*3/uL — AB (ref 4.0–10.5)

## 2015-09-01 LAB — COMPREHENSIVE METABOLIC PANEL
ALBUMIN: 2.9 g/dL — AB (ref 3.5–5.0)
ALT: 20 U/L (ref 17–63)
ANION GAP: 7 (ref 5–15)
AST: 14 U/L — ABNORMAL LOW (ref 15–41)
Alkaline Phosphatase: 73 U/L (ref 38–126)
BILIRUBIN TOTAL: 0.4 mg/dL (ref 0.3–1.2)
BUN: 55 mg/dL — ABNORMAL HIGH (ref 6–20)
CO2: 39 mmol/L — ABNORMAL HIGH (ref 22–32)
Calcium: 8.7 mg/dL — ABNORMAL LOW (ref 8.9–10.3)
Chloride: 101 mmol/L (ref 101–111)
Creatinine, Ser: 2.54 mg/dL — ABNORMAL HIGH (ref 0.61–1.24)
GFR calc Af Amer: 28 mL/min — ABNORMAL LOW (ref >60)
GFR calc non Af Amer: 24 mL/min — ABNORMAL LOW (ref >60)
GLUCOSE: 127 mg/dL — AB (ref 65–99)
POTASSIUM: 5.2 mmol/L — AB (ref 3.5–5.1)
SODIUM: 147 mmol/L — AB (ref 135–145)
TOTAL PROTEIN: 6.2 g/dL — AB (ref 6.5–8.1)

## 2015-09-01 LAB — BLOOD GAS, ARTERIAL
ACID-BASE EXCESS: 7.5 mmol/L — AB (ref 0.0–2.0)
ACID-BASE EXCESS: 8.6 mmol/L — AB (ref 0.0–2.0)
Bicarbonate: 36.8 mEq/L — ABNORMAL HIGH (ref 20.0–24.0)
Bicarbonate: 37.7 mEq/L — ABNORMAL HIGH (ref 20.0–24.0)
DRAWN BY: 11249
Delivery systems: POSITIVE
Drawn by: 11249
EXPIRATORY PAP: 6
FIO2: 1
FIO2: 1
Inspiratory PAP: 18
Mode: POSITIVE
O2 SAT: 94.6 %
O2 Saturation: 93.1 %
PATIENT TEMPERATURE: 98.4
PATIENT TEMPERATURE: 98.6
PCO2 ART: 95.3 mmHg — AB (ref 35.0–45.0)
PCO2 ART: 94.5 mmHg — AB (ref 35.0–45.0)
PH ART: 7.21 — AB (ref 7.350–7.450)
PH ART: 7.225 — AB (ref 7.350–7.450)
PO2 ART: 75.1 mmHg — AB (ref 80.0–100.0)
PO2 ART: 81.3 mmHg (ref 80.0–100.0)
RATE: 24 resp/min
TCO2: 36.3 mmol/L (ref 0–100)
TCO2: 37.1 mmol/L (ref 0–100)

## 2015-09-01 LAB — TROPONIN I: Troponin I: 0.06 ng/mL — ABNORMAL HIGH (ref <0.031)

## 2015-09-01 LAB — PROCALCITONIN: PROCALCITONIN: 0.33 ng/mL

## 2015-09-01 LAB — BRAIN NATRIURETIC PEPTIDE: B NATRIURETIC PEPTIDE 5: 562.3 pg/mL — AB (ref 0.0–100.0)

## 2015-09-01 MED ORDER — SODIUM CHLORIDE 0.9 % IJ SOLN
3.0000 mL | INTRAMUSCULAR | Status: DC | PRN
  Administered 2015-09-08: 3 mL via INTRAVENOUS
  Filled 2015-09-01: qty 3

## 2015-09-01 MED ORDER — SODIUM CHLORIDE 0.9 % IV SOLN: 250.0000 mL | INTRAVENOUS | Status: DC | PRN

## 2015-09-01 MED ORDER — METHYLPREDNISOLONE SODIUM SUCC 125 MG IJ SOLR
125.0000 mg | Freq: Once | INTRAMUSCULAR | Status: AC
  Administered 2015-09-01: 125 mg via INTRAVENOUS
  Filled 2015-09-01: qty 2

## 2015-09-01 MED ORDER — FUROSEMIDE 10 MG/ML IJ SOLN
40.0000 mg | Freq: Once | INTRAMUSCULAR | Status: AC
Start: 2015-09-01 — End: 2015-09-01
  Administered 2015-09-01: 40 mg via INTRAVENOUS
  Filled 2015-09-01: qty 4

## 2015-09-01 MED ORDER — SODIUM CHLORIDE 0.9 % IJ SOLN
3.0000 mL | Freq: Two times a day (BID) | INTRAMUSCULAR | Status: DC
Start: 2015-09-01 — End: 2015-09-08
  Administered 2015-09-02 – 2015-09-07 (×12): 3 mL via INTRAVENOUS

## 2015-09-01 MED ORDER — DEXTROSE 5 % IV SOLN
1.0000 g | INTRAVENOUS | Status: AC
  Administered 2015-09-01: 1 g via INTRAVENOUS
  Filled 2015-09-01: qty 1

## 2015-09-01 MED ORDER — DEXTROSE 5 % IV SOLN
500.0000 mg | INTRAVENOUS | Status: DC
  Administered 2015-09-01 – 2015-09-05 (×5): 500 mg via INTRAVENOUS
  Filled 2015-09-01 (×6): qty 500

## 2015-09-01 MED ORDER — DEXTROSE 5 % IV SOLN: 1.0000 g | INTRAVENOUS | Status: DC

## 2015-09-01 MED ORDER — ALBUTEROL (5 MG/ML) CONTINUOUS INHALATION SOLN
10.0000 mg/h | INHALATION_SOLUTION | RESPIRATORY_TRACT | Status: DC
  Administered 2015-09-01: 10 mg/h via RESPIRATORY_TRACT
  Filled 2015-09-01: qty 20

## 2015-09-01 MED ORDER — IPRATROPIUM-ALBUTEROL 0.5-2.5 (3) MG/3ML IN SOLN
3.0000 mL | RESPIRATORY_TRACT | Status: DC
  Administered 2015-09-02 (×4): 3 mL via RESPIRATORY_TRACT
  Filled 2015-09-01 (×5): qty 3

## 2015-09-01 MED ORDER — VANCOMYCIN HCL 10 G IV SOLR
1750.0000 mg | INTRAVENOUS | Status: DC
  Administered 2015-09-03 – 2015-09-05 (×2): 1750 mg via INTRAVENOUS
  Filled 2015-09-01: qty 750
  Filled 2015-09-01: qty 1750

## 2015-09-01 MED ORDER — PANTOPRAZOLE SODIUM 40 MG IV SOLR
40.0000 mg | Freq: Every day | INTRAVENOUS | Status: DC
  Administered 2015-09-02 (×2): 40 mg via INTRAVENOUS
  Filled 2015-09-01 (×2): qty 40

## 2015-09-01 MED ORDER — VANCOMYCIN HCL 10 G IV SOLR
2000.0000 mg | INTRAVENOUS | Status: AC
  Administered 2015-09-01: 2000 mg via INTRAVENOUS
  Filled 2015-09-01: qty 2000

## 2015-09-01 MED ORDER — HEPARIN SODIUM (PORCINE) 5000 UNIT/ML IJ SOLN
5000.0000 [IU] | Freq: Three times a day (TID) | INTRAMUSCULAR | Status: DC
  Administered 2015-09-02 – 2015-09-08 (×19): 5000 [IU] via SUBCUTANEOUS
  Filled 2015-09-01 (×20): qty 1

## 2015-09-01 NOTE — H&P (Signed)
PULMONARY / CRITICAL CARE MEDICINE   Name: Wesley [name] MRN: [id number] DOB: 19-Sep-1945    ADMISSION DATE:  09/01/2015 CONSULTATION DATE:  09/01/2015  REFERRING MD:  EDP Pfeiffer  CHIEF COMPLAINT:  SOB  HISTORY OF PRESENT ILLNESS:   69 year old male with PMH as below, which includes OHS on home O2 (followed by [facility]), TBI, HTN,, Pulmonary HTN secondary to mod-severe AS. He has 2 recent admission for hypoxemic respiratory failure thought secondary to aspiration PNA. He was initially treated with IV antibiotics which were switched to PO with subsequent deterioration. He was restarted on IV, and was eventually discharged on PO cefpodoxime and flagyl. He has a remote history of achalasia and often has swallowing difficulties increasing concern for aspitration. He was evaluated by ST during last admission who recommended dysphagia 3 diet and nectar thick. After discharge 12/17 he continued to feel week but had no complaints of SOB/CP/Cough. 12/20 he had acute decompensation with hypoxemia and O2 sats in 60s at home. Wife turned home O2 (chronically 3 L) up to 10 liters with no improvement. He was also lethargic. Wife called EMS. Upon arrival to ED ABG was obtained and demonstrated respiratory acidosis. He was started on BiPAP. His mental status improved, however ABG did not. CXR demonstrated persistent Bilateral, but primarily RML/RLL opacification. Laboratory evaluation significant for WBC 19.4, Hgb 8.6, K 5.2, SCr 2.54. PCCM asked to admit.  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  PAST SURGICAL HISTORY: He  has past surgical history that includes Knee arthroscopy  (11/08/2002); Knee arthroscopy w/ partial medial meniscectomy (11/08/2002); Chondroplasty (11/08/2002); and Chondroplasty (11/08/2002).  No Known Allergies  No current facility-administered medications on file prior to encounter.   Current Outpatient Prescriptions on File Prior to Encounter  Medication Sig  . albuterol (PROVENTIL HFA;VENTOLIN HFA) 108 (90 BASE) MCG/ACT inhaler Inhale 2 puffs into the lungs every 6 (six) hours as needed for wheezing or shortness of breath.  [name] albuterol (PROVENTIL) (2.5 MG/3ML) 0.083% nebulizer solution Take 2.5 mg by nebulization every 6 (six) hours as needed for wheezing or shortness of breath.   [name] amLODipine (NORVASC) 10 MG tablet Take 10 mg by mouth every morning.   [name] aspirin EC 81 MG tablet Take 81 mg by mouth every morning.  . carboxymethylcellulose (REFRESH) 1 % ophthalmic solution Apply 1 drop to eye 3 (three) times daily.  . cefpodoxime (VANTIN) 200 MG tablet Take 1 tablet (200 mg total) by mouth every 12 (twelve) hours. For 3days  . cholecalciferol (VITAMIN D) 1000 UNITS tablet Take 1,000 Units by mouth daily.   [name] docusate sodium (COLACE) 100 MG capsule Take 100 mg by mouth daily.  [name] gabapentin (NEURONTIN) 300 MG capsule Take 300 mg by mouth at bedtime.   [name] galantamine (RAZADYNE ER) 16 MG 24 hr capsule Take 16 mg by mouth at bedtime.   [name] levothyroxine (SYNTHROID, LEVOTHROID) 125 MCG tablet Take 125 mcg by mouth daily before breakfast.  . Maltodextrin-Xanthan Gum (RESOURCE THICKENUP CLEAR) POWD Please dispense 1 box ( 1 month supply)  . metoprolol (LOPRESSOR) 100 MG tablet Take 100 mg by mouth 2 (two) times daily.  . metroNIDAZOLE (FLAGYL) 500 MG tablet Take 1 tablet (500 mg total) by mouth every 8 (eight) hours. For 3days  .  pantoprazole (PROTONIX) 40 MG tablet Take 1 tablet (40 mg total) by mouth 2 (two) times daily.  . pravastatin (PRAVACHOL) 20 MG tablet Take 20 mg by mouth at bedtime.  . predniSONE (DELTASONE) 20 MG tablet Take 40mg  for 1day then 20mg   for 2days then 10mg  for 2days then STOP  . QUEtiapine (SEROQUEL) 100 MG tablet Take 100 mg by mouth at bedtime. Take with 25 mg tablet to equal a total dose of 125 mg  . QUEtiapine (SEROQUEL) 25 MG tablet Take 25 mg by mouth at bedtime. Take with 100mg  tablet to equal a total dose of 125 mg  . sertraline (ZOLOFT) 100 MG tablet Take 150 mg by mouth at bedtime.  . Tamsulosin HCl (FLOMAX) 0.4 MG CAPS Take 0.4 mg by mouth daily after breakfast. Once a day    FAMILY HISTORY:  His is adopted.  SOCIAL HISTORY: He  reports that he has never smoked. He has never used smokeless tobacco. He reports that he does not drink alcohol or use illicit drugs.  REVIEW OF SYSTEMS:   Bolds are positive  Constitutional: weight loss, gain, night sweats, Fevers, chills, fatigue .  HEENT: headaches, Sore throat, sneezing, nasal congestion, post nasal drip, Difficulty swallowing, Tooth/dental problems, visual complaints visual changes, ear ache CV:  chest pain, radiates: ,Orthopnea, PND, swelling in lower extremities, dizziness, palpitations, syncope.  GI  heartburn, indigestion daily for years, abdominal pain, nausea, vomiting, diarrhea, change in bowel habits, loss of appetite, bloody stools.  Resp: cough, productive: , hemoptysis, dyspnea, chest pain, pleuritic.  Skin: rash or itching or icterus GU: dysuria, change in color of urine, urgency or frequency. flank pain, hematuria  MS: joint pain or swelling. decreased range of motion  Psych: change in mood or affect. depression or anxiety.  Neuro: difficulty with speech, weakness, numbness, ataxia    SUBJECTIVE:    VITAL SIGNS: BP 144/113 mmHg  Pulse 77  Resp 16  SpO2 92%  HEMODYNAMICS:    VENTILATOR SETTINGS:    INTAKE / OUTPUT:    PHYSICAL EXAMINATION: General:  Morbidly obese male in NAD on BiPAP Neuro:  Spontaneously awake, alert, oriented, mild confusion HEENT:  [place]/AT, PERRL, difficult to discern JVD due to body habitus.  Cardiovascular:   RRR, no MRG. trace BLE edema Lungs:  Rhonchi R>L, unlabored Abdomen:  Obese, soft, non-tender, non-distended Musculoskeletal:  No acute deformity or ROM limiation Skin:  Grossly intact, ecchymotic area to RLQ abodmen  LABS:  BMET  Recent Labs Lab 08/27/15 1719 08/28/15 0520 09/01/15 2030  NA 147* 148* 147*  K 5.3* 4.9 5.2*  CL 103 103 101  CO2 37* 37* 39*  BUN 71* 71* 55*  CREATININE 2.61* 2.53* 2.54*  GLUCOSE 166* 130* 127*    Electrolytes  Recent Labs Lab 08/27/15 1719 08/28/15 0520 09/01/15 2030  CALCIUM 8.9 8.9 8.7*    CBC  Recent Labs Lab 08/28/15 0520 08/29/15 0828 09/01/15 2030  WBC 18.7* 13.6* 19.4*  HGB 8.8* 9.1* 8.6*  HCT 31.1* 32.8* 30.5*  PLT 251 218 175    Coag's No results for input(s): APTT, INR in the last 168 hours.  Sepsis Markers No results for input(s): LATICACIDVEN, PROCALCITON, O2SATVEN in the last 168 hours.  ABG  Recent Labs Lab 09/01/15 2059  PHART 7.210*  PCO2ART 95.3*  PO2ART 75.1*    Liver Enzymes  Recent Labs Lab 09/01/15 2030  AST 14*  ALT 20  ALKPHOS 73  BILITOT 0.4  ALBUMIN 2.9*    Cardiac Enzymes  Recent  Labs Lab 09/01/15 2030  TROPONINI 0.06*    Glucose No results for input(s): GLUCAP in the last 168 hours.  Imaging Dg Chest Portable 1 View  09/01/2015  CLINICAL DATA:  Shortness of Breath EXAM: PORTABLE CHEST 1 VIEW COMPARISON:  August 27, 2015 FINDINGS: There is increase in consolidation in the right base. Patchy infiltrate remains in both upper lobes and left base region without change. Heart is enlarged with pulmonary vascularity within normal limits. No adenopathy appreciable. IMPRESSION: Airspace opacity bilaterally, increased in the right base and stable elsewhere. Stable cardiomegaly. Suspect multifocal pneumonia, although a degree of underlying congestive heart failure may be present. Both congestive heart failure and pneumonia may exist concurrently. Electronically Signed   By:  Lowella Grip III M.D.   On: 09/01/2015 19:27     STUDIES:  PSG 06/03/13 >> AHI 5, SpO2 low 82%, REM 16. 101 min with SpO2 < 88%. Needed 1 liter oxygen. ONO with RA 06/05/13 >> Test time 8 hrs 31 min. Mean SpO2 84%, low SpO2 59%. Spent 8 hrs 1 min with SpO2 < 88%. ONO with 2 liters 06/27/13 >> Test time 7 hrs 25 min. Basal SpO2 91.9%, low SpO2 76%. Spent 11 min with SpO2 < 88%. Echo 07/07/2015 >  Could not eval LVEF (prior study 55-60%), mod-severe AS (new from prior) CT chest  12/20 >>> Echo  12/20 >>>   CULTURES: Blood 12/20 >  Sputum 12/20 > Urine strep 12/20 > Urine legionella 12/20 >  ANTIBIOTICS: Cefepime 12/20 > Vancomycin 12/20 > Azithromycin 12/20 >  SIGNIFICANT EVENTS: 12/17 discharged on oral ABX for PNA 12/20 re-admit for PNA, Resp failure on BiPAP  LINES/TUBES:   DISCUSSION: 69 year old male admitted with respiratory failure due to recurrent PNA with a likely component of pulmonary edema. Requiring BiPAP at time of admission, mentation has improved well, but ABG is lagging. Will continue BiPAP overnight, treat with broad ABX and add azithro in case atypical infection. Diuresing gently. CT chest to better evaluate opacifications. Close monitoring in ICU. Full Code.  ASSESSMENT / PLAN:  PULMONARY A: Acute on chronic respiratory failure with hypoxemia and hypercarbia Bilateral infiltrates likely PNA, concern aspiration. Pulmonary edema likely contributing as well.  Pulmonary Hypertension, secondary Respiratory acidosis > slow to improve on BiPAP but mentation improved OHS not on nocturnal NIMV  P:   Continue BiPAP tonight and re-evaluate in AM Repeat ABG in AM CT chest to better evaluate infiltrates Diurese per cardiology section ABX per ID section  CARDIOVASCULAR A:  Acute on chronic CHF Tropinin elevation, mild H/o HTN H/o HLD  P:  Telemetry Diurese (lasix 40 in ED, will order additional 40mg  for AM) Repeat echocardiogram Hold home  anti-hypertensive while diuresing (amlodipine, metoprolol) Hold home statin while NPO PRN metoprolol IV Trend troponin  RENAL A:   Acute renal failure on CKD stage IV Hyperkalemia  P:   Careful diuresis > should address hyper K as well Repeat BMP in AM Saline lock  Correct electrolytes as indicated.  GASTROINTESTINAL A:   GERD Chronic aspiration  P:   NPO Will need dysphagia 3 and nectar thick once ready for diet Protonix daily for SUP  HEMATOLOGIC A:   Anemia - hemoglobin at baseline  P:  Heparin for VTE prophylaxis SCDs Trend CBC  INFECTIOUS A:   SIRS/sepsis secondary to PNA > concern for HCAP vs viral vs atypical  P:   ABX and cultures as above Trend PCT Has struggled with transition to PO abx several times  now, consider prolonged course IV antibiotics. PCT  ENDOCRINE A:   Hypothyroid Recently on steroids as inpatient, finished taper 12/20  P:   TSH Synthroid to IV  NEUROLOGIC A:   Acute metabolic encephalopathy  P:   RASS goal: 0 Holding sedating med (including home seroquel, zoloft, neurontin) Monitor   FAMILY  - Updates: Patient and wife updated bu PH/JN in ED.  - Inter-disciplinary family meet or Palliative Care meeting due by:  12/27   Georgann Housekeeper, AGACNP-BC [facility] Pager [phone] or [phone]  09/02/2015 12:04 AM

## 2015-09-01 NOTE — ED Notes (Signed)
RT called for ABG and BiPap

## 2015-09-01 NOTE — Telephone Encounter (Signed)
Called spoke with spouse. She reports pt is normally on 3-4 liters O2. They are having to put pt on 10 liters to keep his sats at 88%.  She reports pt is not feeling SOB and is not gasping for air. They called [facility] to ensure pt machine is working properly and they came out and exchanged all equipment and pt still having hard time keeping sats up. As speaking with spouse pt was dropping into the 70's on 10 liters at rest.  Spoke with TP, pt needs to call 911 and go to the ED. Spoke with spouse and she will do so now. Nothing further needed

## 2015-09-01 NOTE — ED Notes (Signed)
Per EMS - patient was admitted to hospital earlier this month for PNA.  He began having low sats at home this afternoon and called home health to have oxygen tank checked at home.  Home health replaced tank.  Patient continued to have sats in 70's - 80's while wearing 8 L O2 via [place].  EMS placed NRB on patient and his sats rose to 96%.  Patient is somewhat lethargic, but responsive. Patient is followed by [facility].  Vitals on scene, 140/86, HR 79 (NSR on monitor), RR 25-30, sats per above.

## 2015-09-01 NOTE — ED Notes (Signed)
Patient transported to CT 

## 2015-09-01 NOTE — ED Notes (Signed)
RT at bedside.

## 2015-09-01 NOTE — ED Notes (Signed)
Bed: WA17 Expected date:  Expected time:  Means of arrival:  Comments: EMS- 69yo M, SOB/non-rebreather 95%/COPD

## 2015-09-01 NOTE — ED Notes (Signed)
RT called concerning continuous nebulizer; RT states she must complete paperwork for pt before she can return to administer treatment

## 2015-09-01 NOTE — ED Provider Notes (Signed)
CSN: [id number]     Arrival date & time 09/01/15  1753 History   First MD Initiated Contact with Patient 09/01/15 1909     Chief Complaint  Patient presents with  . Shortness of Breath     (Consider location/radiation/quality/duration/timing/severity/associated sxs/prior Treatment) HPI Patient has had increasing hypoxia at home. Reportedly the home health nurse came to change out the patient's oxygen tank and he continued to have oxygen saturations in the 70s and 80s despite wearing 8 L of nasal cannula oxygen. His wife reports that he has not had other associated symptoms. She reports has not atypical. Sometimes his oxygen saturation is to start dropping and frequently, she reports, it's because he has pneumonia. He just had a recent hospitalization for pneumonia. She also states there is been suspicion of aspiration as the etiology pneumonia. Past Medical History  Diagnosis Date  . Chronic kidney disease     Stage IV  . Oropharyngeal cancer ([place])   . HTN (hypertension)   . Aortic stenosis     mod-severe  . Morbid obesity ([place])   . Pulmonary hypertension ([place])     secondary  . Obesity hypoventilation syndrome ([facility])     Intolerant of NIMV  . Achalasia   . PTSD (post-traumatic stress disorder)   . Hypothyroidism   . TBI (traumatic brain injury) ([place])   . 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 Knee arthroscopy  11/08/2002    right knee examination  . Knee arthroscopy w/ partial medial meniscectomy  11/08/2002  . Chondroplasty  11/08/2002    extensive, of the patellofemoral joint  . Chondroplasty  11/08/2002    lesser extent chondroplasty medial femoral condyle   Family History  Problem Relation Age of Onset  . Adopted: Yes  . Aneurysm Mother     deceased from brain aneurysm   Social History  Substance Use Topics  . Smoking status: Never Smoker   . Smokeless tobacco: Never Used     Comment: smoked 1 pack a month   . Alcohol Use: No    Review of Systems  10 Systems reviewed and are negative for acute change except as noted in the HPI.   Allergies  Review of patient's allergies indicates no known allergies.  Home Medications   Prior to Admission medications   Medication Sig Start Date End Date Taking? Authorizing Provider  albuterol (PROVENTIL HFA;VENTOLIN HFA) 108 (90 BASE) MCG/ACT inhaler Inhale 2 puffs into the lungs every 6 (six) hours as needed for wheezing or shortness of breath. 08/13/15  Yes Nishant Dhungel, MD  albuterol (PROVENTIL) (2.5 MG/3ML) 0.083% nebulizer solution Take 2.5 mg by nebulization every 6 (six) hours as needed for wheezing or shortness of breath.  07/18/14  Yes Historical Provider, MD  amLODipine (NORVASC) 10 MG tablet Take 10 mg by mouth every morning.    Yes Historical Provider, MD  aspirin EC 81 MG tablet Take 81 mg by mouth every morning.   Yes Historical Provider, MD  carboxymethylcellulose (REFRESH) 1 % ophthalmic solution Apply 1 drop to eye 3 (three) times daily.   Yes Historical Provider, MD  cefpodoxime (VANTIN) 200 MG tablet Take 1 tablet (200 mg total) by mouth every 12 (twelve) hours. For 3days 08/29/15  Yes Domenic Polite, MD  cholecalciferol (VITAMIN D) 1000 UNITS tablet Take 1,000 Units by mouth daily.    Yes Historical Provider, MD  docusate sodium (COLACE) 100 MG capsule Take 100 mg  by mouth daily.   Yes Historical Provider, MD  gabapentin (NEURONTIN) 300 MG capsule Take 300 mg by mouth at bedtime.    Yes Historical Provider, MD  galantamine (RAZADYNE ER) 16 MG 24 hr capsule Take 16 mg by mouth at bedtime.    Yes Historical Provider, MD  levothyroxine (SYNTHROID, LEVOTHROID) 125 MCG tablet Take 125 mcg by mouth daily before breakfast.   Yes Historical Provider, MD  Maltodextrin-Xanthan Gum ([place]) POWD Please dispense 1 box ( 1 month supply) 08/29/15  Yes Domenic Polite, MD  metoprolol (LOPRESSOR) 100 MG tablet Take 100 mg by mouth 2  (two) times daily.   Yes Historical Provider, MD  metroNIDAZOLE (FLAGYL) 500 MG tablet Take 1 tablet (500 mg total) by mouth every 8 (eight) hours. For 3days 08/29/15  Yes Domenic Polite, MD  pantoprazole (PROTONIX) 40 MG tablet Take 1 tablet (40 mg total) by mouth 2 (two) times daily. 08/29/15  Yes Domenic Polite, MD  pravastatin (PRAVACHOL) 20 MG tablet Take 20 mg by mouth at bedtime.   Yes Historical Provider, MD  predniSONE (DELTASONE) 20 MG tablet Take 40mg  for 1day then 20mg  for 2days then 10mg  for 2days then STOP 08/29/15  Yes Domenic Polite, MD  QUEtiapine (SEROQUEL) 100 MG tablet Take 100 mg by mouth at bedtime. Take with 25 mg tablet to equal a total dose of 125 mg   Yes Historical Provider, MD  QUEtiapine (SEROQUEL) 25 MG tablet Take 25 mg by mouth at bedtime. Take with 100mg  tablet to equal a total dose of 125 mg   Yes Historical Provider, MD  sertraline (ZOLOFT) 100 MG tablet Take 150 mg by mouth at bedtime.   Yes Historical Provider, MD  Tamsulosin HCl (FLOMAX) 0.4 MG CAPS Take 0.4 mg by mouth daily after breakfast. Once a day 11/14/10  Yes Historical Provider, MD   BP 153/81 mmHg  Pulse 69  Temp(Src) 98.2 F (36.8 C) (Oral)  Resp 20  Ht 5\' 10"  (1.778 m)  Wt 284 lb 13.4 oz (129.2 kg)  BMI 40.87 kg/m2  SpO2 98% Physical Exam  Constitutional:  The patient is morbidly obese. He is somnolent. He is pale in appearance.  HENT:  Head: Normocephalic and atraumatic.  Mouth/Throat: Oropharynx is clear and moist.  Cardiovascular: Normal rate, regular rhythm, normal heart sounds and intact distal pulses.   Pulmonary/Chest:  Patient appears to be taking somewhat shallow inspirations. He can respond in short sentences periodically. Difficult auscultation due to patient's body habitus. There is air flow on the upper lung fields.  Abdominal: Soft. There is no tenderness.  Musculoskeletal: He exhibits edema.  Neurological:  Patient is somnolent with occasionally myoclonic jerk of the upper  extremities. He can awaken to follow commands purposefully. No focal deficit of motor function of extremities.  Skin: Skin is warm and dry.    ED Course  Procedures (including critical care time) CRITICAL CARE Performed by: Charlesetta Shanks   Total critical care time: 45 minutes  Critical care time was exclusive of separately billable procedures and treating other patients.  Critical care was necessary to treat or prevent imminent or life-threatening deterioration.  Critical care was time spent personally by me on the following activities: development of treatment plan with patient and/or surrogate as well as nursing, discussions with consultants, evaluation of patient's response to treatment, examination of patient, obtaining history from patient or surrogate, ordering and performing treatments and interventions, ordering and review of laboratory studies, ordering and review of radiographic studies, pulse oximetry  and re-evaluation of patient's condition. Labs Review Labs Reviewed  COMPREHENSIVE METABOLIC PANEL - Abnormal; Notable for the following:    Sodium 147 (*)    Potassium 5.2 (*)    CO2 39 (*)    Glucose, Bld 127 (*)    BUN 55 (*)    Creatinine, Ser 2.54 (*)    Calcium 8.7 (*)    Total Protein 6.2 (*)    Albumin 2.9 (*)    AST 14 (*)    GFR calc non Af Amer 24 (*)    GFR calc Af Amer 28 (*)    All other components within normal limits  BRAIN NATRIURETIC PEPTIDE - Abnormal; Notable for the following:    B Natriuretic Peptide 562.3 (*)    All other components within normal limits  TROPONIN I - Abnormal; Notable for the following:    Troponin I 0.06 (*)    All other components within normal limits  CBC WITH DIFFERENTIAL/PLATELET - Abnormal; Notable for the following:    WBC 19.4 (*)    RBC 3.40 (*)    Hemoglobin 8.6 (*)    HCT 30.5 (*)    MCH 25.3 (*)    MCHC 28.2 (*)    RDW 18.1 (*)    Neutro Abs 17.0 (*)    Monocytes Absolute 1.4 (*)    All other components  within normal limits  BLOOD GAS, ARTERIAL - Abnormal; Notable for the following:    pH, Arterial 7.210 (*)    pCO2 arterial 95.3 (*)    pO2, Arterial 75.1 (*)    Bicarbonate 36.8 (*)    Acid-Base Excess 7.5 (*)    All other components within normal limits  BLOOD GAS, ARTERIAL - Abnormal; Notable for the following:    pH, Arterial 7.225 (*)    pCO2 arterial 94.5 (*)    Bicarbonate 37.7 (*)    Acid-Base Excess 8.6 (*)    All other components within normal limits  BLOOD GAS, ARTERIAL - Abnormal; Notable for the following:    pH, Arterial 7.249 (*)    pCO2 arterial 87.6 (*)    pO2, Arterial 67.2 (*)    Bicarbonate 37.1 (*)    Acid-Base Excess 8.6 (*)    All other components within normal limits  CBC - Abnormal; Notable for the following:    WBC 13.3 (*)    RBC 3.02 (*)    Hemoglobin 7.5 (*)    HCT 27.1 (*)    MCH 24.8 (*)    MCHC 27.7 (*)    RDW 18.1 (*)    All other components within normal limits  BASIC METABOLIC PANEL - Abnormal; Notable for the following:    Chloride 97 (*)    CO2 37 (*)    Glucose, Bld 164 (*)    BUN 53 (*)    Creatinine, Ser 2.77 (*)    Calcium 8.4 (*)    GFR calc non Af Amer 22 (*)    GFR calc Af Amer 25 (*)    All other components within normal limits  PHOSPHORUS - Abnormal; Notable for the following:    Phosphorus 4.8 (*)    All other components within normal limits  TROPONIN I - Abnormal; Notable for the following:    Troponin I 0.08 (*)    All other components within normal limits  TROPONIN I - Abnormal; Notable for the following:    Troponin I 0.09 (*)    All other components within normal limits  CBC -  Abnormal; Notable for the following:    WBC 12.7 (*)    RBC 3.01 (*)    Hemoglobin 7.6 (*)    HCT 26.0 (*)    MCH 25.2 (*)    MCHC 29.2 (*)    RDW 18.4 (*)    All other components within normal limits  BASIC METABOLIC PANEL - Abnormal; Notable for the following:    Sodium 149 (*)    Chloride 100 (*)    CO2 39 (*)    Glucose, Bld  107 (*)    BUN 60 (*)    Creatinine, Ser 2.92 (*)    Calcium 8.7 (*)    GFR calc non Af Amer 20 (*)    GFR calc Af Amer 24 (*)    All other components within normal limits  CBC - Abnormal; Notable for the following:    WBC 10.7 (*)    RBC 3.32 (*)    Hemoglobin 8.3 (*)    HCT 28.8 (*)    MCH 25.0 (*)    MCHC 28.8 (*)    RDW 18.2 (*)    All other components within normal limits  BASIC METABOLIC PANEL - Abnormal; Notable for the following:    Sodium 147 (*)    Chloride 96 (*)    CO2 40 (*)    Glucose, Bld 104 (*)    BUN 57 (*)    Creatinine, Ser 2.69 (*)    Calcium 8.8 (*)    GFR calc non Af Amer 22 (*)    GFR calc Af Amer 26 (*)    All other components within normal limits  VANCOMYCIN, TROUGH - Abnormal; Notable for the following:    Vancomycin Tr 21 (*)    All other components within normal limits  CULTURE, BLOOD (ROUTINE X 2)  CULTURE, BLOOD (ROUTINE X 2)  RESPIRATORY VIRUS PANEL  CULTURE, EXPECTORATED SPUTUM-ASSESSMENT  CULTURE, RESPIRATORY (NON-EXPECTORATED)  CULTURE, RESPIRATORY (NON-EXPECTORATED)  PROCALCITONIN  PROCALCITONIN  MAGNESIUM  TSH  PROCALCITONIN  PROCALCITONIN  MAGNESIUM  LEGIONELLA ANTIGEN, URINE  STREP PNEUMONIAE URINARY ANTIGEN  CBC  BASIC METABOLIC PANEL    Imaging Review No results found. I have personally reviewed and evaluated these images and lab results as part of my medical decision-making.   EKG Interpretation   Date/Time:  Tuesday September 01 2015 18:21:51 EST Ventricular Rate:  82 PR Interval:  230 QRS Duration: 102 QT Interval:  379 QTC Calculation: 443 R Axis:   33 Text Interpretation:  Sinus rhythm Prolonged PR interval peaked T waves  more pronounced in precordial leads Confirmed by LITTLE MD, RACHEL [id number])  on 09/01/2015 6:25:46 PM     Consult:(21:30) Patient's case is reviewed with intensivist for assessment in the emergency department. MDM   Final diagnoses:  Respiratory failure ([place])  Pneumonia   Patient  has a very complex past medical history with recurrent episodes of pneumonia as well as significant hypoventilation obesity syndrome. The patient is hypercapnic and this is consistent with the encephalopathy he is exhibiting. Patient hasn't started on BiPAP in the emergency department and intensivist consult for admission.    Charlesetta Shanks, MD 09/06/15 2046

## 2015-09-01 NOTE — Progress Notes (Signed)
ANTIBIOTIC CONSULT NOTE - INITIAL  Pharmacy Consult for Vancomycin & Cefepime Indication: pneumonia  No Known Allergies  Patient Measurements:   Weight: 131.1 kg Height: 69 inches  Vital Signs: BP: 137/75 mmHg (12/20 2136) Pulse Rate: 85 (12/20 2136) Intake/Output from previous day:   Intake/Output from this shift:    Labs:  Recent Labs  09/01/15 2030  WBC 19.4*  HGB 8.6*  PLT 175  CREATININE 2.54*   Estimated Creatinine Clearance: 36.3 mL/min (by C-G formula based on Cr of 2.54). No results for input(s): VANCOTROUGH, VANCOPEAK, VANCORANDOM, GENTTROUGH, GENTPEAK, GENTRANDOM, TOBRATROUGH, TOBRAPEAK, TOBRARND, AMIKACINPEAK, AMIKACINTROU, AMIKACIN in the last 72 hours.   Microbiology: Recent Results (from the past 720 hour(s))  Culture, blood (routine x 2)     Status: None   Collection Time: 08/09/15  8:20 AM  Result Value Ref Range Status   Specimen Description LEFT ANTECUBITAL  Final   Special Requests BOTTLES DRAWN AEROBIC AND ANAEROBIC 5CC  Final   Culture   Final    NO GROWTH 5 DAYS Performed at [facility]    Report Status 08/14/2015 FINAL  Final  Culture, blood (routine x 2)     Status: None   Collection Time: 08/09/15  8:25 AM  Result Value Ref Range Status   Specimen Description BLOOD BLOOD LEFT WRIST  Final   Special Requests BOTTLES DRAWN AEROBIC AND ANAEROBIC 5CC  Final   Culture   Final    NO GROWTH 5 DAYS Performed at [facility]    Report Status 08/14/2015 FINAL  Final  MRSA PCR Screening     Status: None   Collection Time: 08/09/15 10:00 AM  Result Value Ref Range Status   MRSA by PCR NEGATIVE NEGATIVE Final    Comment:        The GeneXpert MRSA Assay (FDA approved for NASAL specimens only), is one component of a comprehensive MRSA colonization surveillance program. It is not intended to diagnose MRSA infection nor to guide or monitor treatment for MRSA infections.   Culture, sputum-assessment     Status: None   Collection Time: 08/09/15  6:51 PM  Result Value Ref Range Status   Specimen Description SPUTUM  Final   Special Requests NONE  Final   Sputum evaluation   Final    THIS SPECIMEN IS ACCEPTABLE. RESPIRATORY CULTURE REPORT TO FOLLOW.   Report Status 08/09/2015 FINAL  Final  Culture, respiratory (NON-Expectorated)     Status: None   Collection Time: 08/09/15  6:51 PM  Result Value Ref Range Status   Specimen Description SPUTUM  Final   Special Requests NONE  Final   Gram Stain   Final    ABUNDANT WBC PRESENT, PREDOMINANTLY PMN FEW SQUAMOUS EPITHELIAL CELLS PRESENT FEW GRAM POSITIVE RODS RARE GRAM NEGATIVE RODS RARE GRAM POSITIVE COCCI IN PAIRS    Culture   Final    NORMAL OROPHARYNGEAL FLORA Performed at [facility]    Report Status 08/12/2015 FINAL  Final  Urine culture     Status: None   Collection Time: 08/20/15 11:22 AM  Result Value Ref Range Status   Specimen Description URINE, CLEAN CATCH  Final   Special Requests NONE  Final   Culture   Final    2,000 COLONIES/mL INSIGNIFICANT GROWTH Performed at [facility]    Report Status 08/21/2015 FINAL  Final  Culture, blood (routine x 2)     Status: None   Collection Time: 08/20/15 11:45 AM  Result Value Ref Range  Status   Specimen Description BLOOD RIGHT HAND  Final   Special Requests BOTTLES DRAWN AEROBIC AND ANAEROBIC 5ML  Final   Culture   Final    NO GROWTH 5 DAYS Performed at [facility]    Report Status 08/25/2015 FINAL  Final  Culture, blood (routine x 2)     Status: None   Collection Time: 08/20/15 11:55 AM  Result Value Ref Range Status   Specimen Description BLOOD LEFT HAND  Final   Special Requests BOTTLES DRAWN AEROBIC AND ANAEROBIC 5ML  Final   Culture   Final    NO GROWTH 5 DAYS Performed at [facility]    Report Status 08/25/2015 FINAL  Final  MRSA PCR Screening     Status: None   Collection Time: 08/20/15  4:41 PM  Result Value Ref Range Status   MRSA by PCR  NEGATIVE NEGATIVE Final    Comment:        The GeneXpert MRSA Assay (FDA approved for NASAL specimens only), is one component of a comprehensive MRSA colonization surveillance program. It is not intended to diagnose MRSA infection nor to guide or monitor treatment for MRSA infections.     Medical History: Past Medical History  Diagnosis Date  . Chronic kidney disease     Stage IV  . Oropharyngeal cancer (HCC)     Medications:  Scheduled:  . furosemide  40 mg Intravenous Once  . ipratropium-albuterol  3 mL Nebulization Q4H  . methylPREDNISolone (SOLU-MEDROL) injection  125 mg Intravenous Once   Infusions:  . albuterol    . ceFEPime (MAXIPIME) IV    . [START ON 09/03/2015] vancomycin    . vancomycin     Assessment:  69 yr male with decreased O2 stats; h/o COPD; recent hospitalization for PNA  Pharmacy consulted to dose Vancomycin & Cefepime for pneumonia  CrCl (n) = 27 ml/min  12/20 >>vanc >> 12/20 >>cefepime >>    12/20 blood: 12/20 sputum:   Trough/Dose change info:  Goal of Therapy:  Vancomycin trough level 15-20 mcg/ml  Plan:  Measure antibiotic drug levels at steady state Follow up culture results Cefepime 1gm IV q24h Vancomycin 2gm IV x 1 then 1750mg  IV q48h  Tahlor Berenguer, Toribio Harbour, PharmD 09/01/2015,9:41 PM

## 2015-09-02 ENCOUNTER — Inpatient Hospital Stay (HOSPITAL_COMMUNITY): Payer: Medicare Other

## 2015-09-02 DIAGNOSIS — N184 Chronic kidney disease, stage 4 (severe): Secondary | ICD-10-CM | POA: Insufficient documentation

## 2015-09-02 DIAGNOSIS — R0600 Dyspnea, unspecified: Secondary | ICD-10-CM

## 2015-09-02 DIAGNOSIS — G9340 Encephalopathy, unspecified: Secondary | ICD-10-CM | POA: Insufficient documentation

## 2015-09-02 LAB — PROCALCITONIN: Procalcitonin: 0.48 ng/mL

## 2015-09-02 LAB — TROPONIN I
TROPONIN I: 0.08 ng/mL — AB (ref <0.031)
Troponin I: 0.09 ng/mL — ABNORMAL HIGH (ref <0.031)

## 2015-09-02 LAB — BASIC METABOLIC PANEL
Anion gap: 11 (ref 5–15)
BUN: 53 mg/dL — AB (ref 6–20)
CALCIUM: 8.4 mg/dL — AB (ref 8.9–10.3)
CO2: 37 mmol/L — AB (ref 22–32)
CREATININE: 2.77 mg/dL — AB (ref 0.61–1.24)
Chloride: 97 mmol/L — ABNORMAL LOW (ref 101–111)
GFR calc Af Amer: 25 mL/min — ABNORMAL LOW (ref >60)
GFR, EST NON AFRICAN AMERICAN: 22 mL/min — AB (ref >60)
GLUCOSE: 164 mg/dL — AB (ref 65–99)
Potassium: 4.8 mmol/L (ref 3.5–5.1)
Sodium: 145 mmol/L (ref 135–145)

## 2015-09-02 LAB — CBC
HEMATOCRIT: 27.1 % — AB (ref 39.0–52.0)
Hemoglobin: 7.5 g/dL — ABNORMAL LOW (ref 13.0–17.0)
MCH: 24.8 pg — ABNORMAL LOW (ref 26.0–34.0)
MCHC: 27.7 g/dL — AB (ref 30.0–36.0)
MCV: 89.7 fL (ref 78.0–100.0)
PLATELETS: 163 10*3/uL (ref 150–400)
RBC: 3.02 MIL/uL — ABNORMAL LOW (ref 4.22–5.81)
RDW: 18.1 % — AB (ref 11.5–15.5)
WBC: 13.3 10*3/uL — ABNORMAL HIGH (ref 4.0–10.5)

## 2015-09-02 LAB — BLOOD GAS, ARTERIAL
ACID-BASE EXCESS: 8.6 mmol/L — AB (ref 0.0–2.0)
BICARBONATE: 37.1 meq/L — AB (ref 20.0–24.0)
DELIVERY SYSTEMS: POSITIVE
Drawn by: 11249
Expiratory PAP: 6
FIO2: 0.4
INSPIRATORY PAP: 14
LHR: 20 {breaths}/min
Mode: POSITIVE
O2 Saturation: 91.5 %
PATIENT TEMPERATURE: 98.6
TCO2: 36.3 mmol/L (ref 0–100)
pCO2 arterial: 87.6 mmHg (ref 35.0–45.0)
pH, Arterial: 7.249 — ABNORMAL LOW (ref 7.350–7.450)
pO2, Arterial: 67.2 mmHg — ABNORMAL LOW (ref 80.0–100.0)

## 2015-09-02 LAB — TSH: TSH: 2.196 u[IU]/mL (ref 0.350–4.500)

## 2015-09-02 LAB — MAGNESIUM: Magnesium: 1.8 mg/dL (ref 1.7–2.4)

## 2015-09-02 LAB — PHOSPHORUS: Phosphorus: 4.8 mg/dL — ABNORMAL HIGH (ref 2.5–4.6)

## 2015-09-02 MED ORDER — METOPROLOL TARTRATE 1 MG/ML IV SOLN
2.5000 mg | INTRAVENOUS | Status: DC | PRN
  Administered 2015-09-02 – 2015-09-03 (×5): 2.5 mg via INTRAVENOUS
  Administered 2015-09-03: 3 mg via INTRAVENOUS
  Administered 2015-09-04: 2.5 mg via INTRAVENOUS
  Filled 2015-09-02 (×8): qty 5

## 2015-09-02 MED ORDER — RESOURCE THICKENUP CLEAR PO POWD
ORAL | Status: DC | PRN
  Filled 2015-09-02: qty 125

## 2015-09-02 MED ORDER — ASPIRIN EC 81 MG PO TBEC
81.0000 mg | DELAYED_RELEASE_TABLET | Freq: Every morning | ORAL | Status: DC
  Administered 2015-09-03 – 2015-09-08 (×6): 81 mg via ORAL
  Filled 2015-09-02 (×7): qty 1

## 2015-09-02 MED ORDER — CHLORHEXIDINE GLUCONATE 0.12 % MT SOLN
15.0000 mL | Freq: Two times a day (BID) | OROMUCOSAL | Status: DC
  Administered 2015-09-02 – 2015-09-08 (×13): 15 mL via OROMUCOSAL
  Filled 2015-09-02 (×12): qty 15

## 2015-09-02 MED ORDER — IPRATROPIUM-ALBUTEROL 0.5-2.5 (3) MG/3ML IN SOLN
3.0000 mL | Freq: Four times a day (QID) | RESPIRATORY_TRACT | Status: DC
  Administered 2015-09-03 – 2015-09-08 (×20): 3 mL via RESPIRATORY_TRACT
  Filled 2015-09-02 (×21): qty 3

## 2015-09-02 MED ORDER — LEVOTHYROXINE SODIUM 100 MCG IV SOLR
62.5000 ug | Freq: Every day | INTRAVENOUS | Status: DC
Start: 2015-09-02 — End: 2015-09-03
  Administered 2015-09-02 – 2015-09-03 (×2): 62.5 ug via INTRAVENOUS
  Filled 2015-09-02 (×2): qty 5

## 2015-09-02 MED ORDER — FUROSEMIDE 10 MG/ML IJ SOLN
40.0000 mg | Freq: Once | INTRAMUSCULAR | Status: AC
  Administered 2015-09-02: 40 mg via INTRAVENOUS
  Filled 2015-09-02: qty 4

## 2015-09-02 MED ORDER — DEXTROSE 5 % IV SOLN
2.0000 g | INTRAVENOUS | Status: DC
  Administered 2015-09-02 – 2015-09-05 (×4): 2 g via INTRAVENOUS
  Filled 2015-09-02 (×5): qty 2

## 2015-09-02 MED ORDER — CETYLPYRIDINIUM CHLORIDE 0.05 % MT LIQD
7.0000 mL | Freq: Two times a day (BID) | OROMUCOSAL | Status: DC
Start: 2015-09-02 — End: 2015-09-08
  Administered 2015-09-02 – 2015-09-07 (×8): 7 mL via OROMUCOSAL

## 2015-09-02 NOTE — Progress Notes (Signed)
[facility] Progress Note Patient Name: Wesley [name] DOB: 12-04-1945 MRN: [id number]   Date of Service  09/02/2015  HPI/Events of Note  ABG on  40%/IPAP 14/EPAP 6/Rate 20 = 7.24/87.6/67.4/37.1. PCO2 has decreased from 94.5 >> 87.6.  eICU Interventions  Continue present therapy. Watch respiratory status closely.     Intervention Category Major Interventions: Respiratory failure - evaluation and management  [name] 09/02/2015, 12:44 AM

## 2015-09-02 NOTE — Care Management Note (Signed)
Case Management Note  Patient Details  Name: Wesley [name] MRN: [id number] Date of Birth: [birth year]  Subjective/Objective:            pna with compliance of meds        Action/Plan:Date: September 02, 2015 Chart reviewed for concurrent status and case management needs. Will continue to follow patient for changes and needs: Velva Harman, RN, BSN, [place]   [phone]   Expected Discharge Date:                  Expected Discharge Plan:  [place]  In-House Referral:  Clinical Social Work  Discharge planning Services  CM Consult  Post Acute Care Choice:  NA Choice offered to:  NA  DME Arranged:    DME Agency:     HH Arranged:    [place] Agency:     Status of Service:  In process, will continue to follow  Medicare Important Message Given:    Date Medicare IM Given:    Medicare IM give by:    Date Additional Medicare IM Given:    Additional Medicare Important Message give by:     If discussed at [place] of Stay Meetings, dates discussed:    Additional Comments:  Leeroy Cha, RN 09/02/2015, 8:02 AM

## 2015-09-02 NOTE — Progress Notes (Signed)
PULMONARY / CRITICAL CARE MEDICINE   Name: Wesley [name] MRN: [id number] DOB: [birth year]    ADMISSION DATE:  09/01/2015 CONSULTATION DATE:  09/01/2015  REFERRING MD:  EDP Pfeiffer  CHIEF COMPLAINT:  SOB  SUBJECTIVE:  Awake, alert.  Wants to come off bipap.  Asking to eat and drink.    VITAL SIGNS: BP 145/69 mmHg  Pulse 70  Temp(Src) 98.6 F (37 C) (Axillary)  Resp 27  Ht 5\' 10"  (1.778 m)  Wt 299 lb 6.2 oz (135.8 kg)  BMI 42.96 kg/m2  SpO2 97%  HEMODYNAMICS:    VENTILATOR SETTINGS: Vent Mode:  [-]  FiO2 (%):  [40 %-100 %] 40 %  INTAKE / OUTPUT: I/O last 3 completed shifts: In: -  Out: 1075 [Urine:1075]  PHYSICAL EXAMINATION: General:  Morbidly obese male in NAD on BiPAP Neuro:  Spontaneously awake, alert, oriented, mild confusion at times HEENT:  [place]/AT, PERRL, difficult to discern JVD due to body habitus.  Cardiovascular:  RRR, no MRG. 2+ BLE edema Lungs:  [place]chi R>L, unlabored Abdomen:  Obese, soft, non-tender, non-distended Musculoskeletal:  No acute deformity or ROM limiation Skin:  Grossly intact, ecchymotic area to RLQ abodmen  LABS:  BMET  Recent Labs Lab 08/28/15 0520 09/01/15 2030 09/02/15 0640  NA 148* 147* 145  K 4.9 5.2* 4.8  CL 103 101 97*  CO2 37* 39* 37*  BUN 71* 55* 53*  CREATININE 2.53* 2.54* 2.77*  GLUCOSE 130* 127* 164*    Electrolytes  Recent Labs Lab 08/28/15 0520 09/01/15 2030 09/02/15 0640  CALCIUM 8.9 8.7* 8.4*  MG  --   --  1.8  PHOS  --   --  4.8*    CBC  Recent Labs Lab 08/29/15 0828 09/01/15 2030 09/02/15 0640  WBC 13.6* 19.4* 13.3*  HGB 9.1* 8.6* 7.5*  HCT 32.8* 30.5* 27.1*  PLT 218 175 163    Coag's No results for input(s): APTT, INR in the last 168 hours.  Sepsis Markers  Recent Labs Lab 09/01/15 2030 09/02/15 0640  PROCALCITON 0.33 0.48    ABG  Recent Labs Lab 09/01/15 2059 09/01/15 2137 09/02/15 0030  PHART 7.210* 7.225* 7.249*  PCO2ART 95.3* 94.5* 87.6*  PO2ART 75.1*  81.3 67.2*    Liver Enzymes  Recent Labs Lab 09/01/15 2030  AST 14*  ALT 20  ALKPHOS 73  BILITOT 0.4  ALBUMIN 2.9*    Cardiac Enzymes  Recent Labs Lab 09/01/15 2030 09/02/15 0100 09/02/15 0640  TROPONINI 0.06* 0.08* 0.09*    Glucose No results for input(s): GLUCAP in the last 168 hours.  Imaging Ct Chest Wo Contrast  09/01/2015  CLINICAL DATA:  69-year-old male with respiratory favor. Patient was admitted to the hospital for pneumonia recently. EXAM: CT CHEST WITHOUT CONTRAST TECHNIQUE: Multidetector CT imaging of the chest was performed following the standard protocol without IV contrast. COMPARISON:  Chest radiograph dated 09/01/2015 FINDINGS: Evaluation of this exam is limited in the absence of intravenous contrast. There is near complete consolidation of the left lower lobe with air bronchogram. Patchy areas of airspace opacity and consolidation no the bilaterally most compatible with multifocal pneumonia. Clinical correlation and follow-up resolution recommended. There are small bilateral pleural effusions, right greater than left no pneumothorax. The central airways are patent. Atherosclerotic calcification of the thoracic aorta. There is cardiomegaly. No pericardial effusion. There is coronary vascular calcification. There is top-normal mediastinal lymph nodes. Bilateral hilar adenopathy may be present. Evaluation of the hila and lymph nodes is limited in the absence  of intravenous contrast. There is a small hiatal hernia with patulous appearance esophagus containing oral content. There is no axillary adenopathy. The chest wall soft tissues appear unremarkable. There is degenerative changes of the spine. Old healed left posterior rib fracture. No acute fracture identified. Diverticulosis of the splenic flexure. The visualized upper abdomen is otherwise grossly unremarkable. IMPRESSION: Multi focal pneumonia. Clinical correlation and follow-up resolution recommended. Small hiatal  hernia with patulous appearing esophagus. Aspiration pneumonia is not excluded. Further evaluation with speech pathology recommended. Electronically Signed   By: Anner Crete M.D.   On: 09/01/2015 23:53   Dg Chest Portable 1 View  09/01/2015  CLINICAL DATA:  Shortness of Breath EXAM: PORTABLE CHEST 1 VIEW COMPARISON:  August 27, 2015 FINDINGS: There is increase in consolidation in the right base. Patchy infiltrate remains in both upper lobes and left base region without change. Heart is enlarged with pulmonary vascularity within normal limits. No adenopathy appreciable. IMPRESSION: Airspace opacity bilaterally, increased in the right base and stable elsewhere. Stable cardiomegaly. Suspect multifocal pneumonia, although a degree of underlying congestive heart failure may be present. Both congestive heart failure and pneumonia may exist concurrently. Electronically Signed   By: Lowella Grip III M.D.   On: 09/01/2015 19:27     STUDIES:  PSG 06/03/13 >> AHI 5, SpO2 low 82%, REM 16. 101 min with SpO2 < 88%. Needed 1 liter oxygen. ONO with RA 06/05/13 >> Test time 8 hrs 31 min. Mean SpO2 84%, low SpO2 59%. Spent 8 hrs 1 min with SpO2 < 88%. ONO with 2 liters 06/27/13 >> Test time 7 hrs 25 min. Basal SpO2 91.9%, low SpO2 76%. Spent 11 min with SpO2 < 88%. Echo 07/07/2015 >  Could not eval LVEF (prior study 55-60%), mod-severe AS (new from prior) CT chest  12/20 >>> multifocal PNA, small hiatal hernia with patulous appearing esophagus.  Echo  12/20 >>>   CULTURES: Blood 12/20 >  Sputum 12/20 > Urine strep 12/20 > Urine legionella 12/20 >  ANTIBIOTICS: Cefepime 12/20 > Vancomycin 12/20 > Azithromycin 12/20 >  SIGNIFICANT EVENTS: 12/17 discharged on oral ABX for PNA 12/20 re-admit for PNA, Resp failure on BiPAP  LINES/TUBES:   DISCUSSION: 69 year old male admitted with acute respiratory failure due to recurrent PNA possibly r/t aspiration with a likely component of pulmonary edema.  Requiring BiPAP at time of admission, mentation has improved well, but ABG is lagging. Treating with broad ABX and add azithro in case atypical infection. Diuresing gently. Close monitoring in ICU. Full Code.  ASSESSMENT / PLAN:  PULMONARY A: Acute on chronic respiratory failure with hypoxemia and hypercarbia Bilateral infiltrates likely PNA, concern aspiration. Pulmonary edema likely contributing as well.  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  GASTROINTESTINAL A:   GERD Chronic aspiration P:  NPO Speech eval - needs objective swallow eval  Protonix daily for SUP May ultimately need repeat PEG - had previously   HEMATOLOGIC A:   Anemia - hemoglobin at baseline  P:  Heparin for VTE prophylaxis SCDs Trend CBC  INFECTIOUS A:   SIRS/sepsis secondary to PNA > concern for HCAP vs viral vs atypical  P:   ABX and cultures as above Trend PCT Has struggled with transition to PO abx several times now, consider prolonged course IV antibiotics +/- further GI w/u  PCT  ENDOCRINE A:   Hypothyroid Recently on steroids as inpatient, finished taper 12/20  P:   Synthroid to IV  NEUROLOGIC A:   Acute metabolic encephalopathy  P:   RASS goal: 0 Holding sedating med (including home seroquel, zoloft,  neurontin) Monitor   FAMILY  - Updates: Patient and wife updated at length 12/21.   - Inter-disciplinary family meet or Palliative Care meeting due by:  12/27   Nickolas Madrid, NP 09/02/2015  10:14 AM Pager: [phone] or [phone]

## 2015-09-02 NOTE — Evaluation (Signed)
Clinical/Bedside Swallow Evaluation Patient Details  Name: Wesley [name] MRN: [id number] Date of Birth: [birth year]  Today's Date: 09/02/2015 Time: SLP Start Time (ACUTE ONLY): 21 SLP Stop Time (ACUTE ONLY): 1448 SLP Time Calculation (min) (ACUTE ONLY): 33 min  Past Medical History:  Past Medical History  Diagnosis Date  . Chronic kidney disease     Stage IV  . Oropharyngeal cancer ([place])   . HTN (hypertension)   . Aortic stenosis     mod-severe  . Morbid obesity ([place])   . Pulmonary hypertension ([place])     secondary  . Obesity hypoventilation syndrome ([facility])     Intolerant of NIMV  . Achalasia   . PTSD (post-traumatic stress disorder)   . Hypothyroidism   . TBI (traumatic brain injury) ([place])   . 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 Knee arthroscopy  11/08/2002    right knee examination  . Knee arthroscopy w/ partial medial meniscectomy  11/08/2002  . Chondroplasty  11/08/2002    extensive, of the patellofemoral joint  . Chondroplasty  11/08/2002    lesser extent chondroplasty medial femoral condyle   HPI:  69 year old male with PMH:  OHS on home O2, TBI, HTN,, Pulmonary HTN, dysphagia, oropharyngeal cancer, achalasia. BSE 08/22/2015 s/s aspiration present, hesitant but agreeable to nectar liquids, Dys 3 with chin tuck (decreased, not eliminated s/s aspiration during BSE). Admitted 12/20 due to acute decompensation with hypoxemia and O2 sats in 60s at home. CXR multi focal pneumonia, small hiatal hernia with patulous appearance esophagus (i.e widened area often associated with Barrett's Esophagus) containing oral content. Speech Pathology assessment recommended in CT report.   Assessment / Plan / Recommendation Clinical Impression  Pt familiar to ST service from evaluation during last weeks admission. He states continued use of nectar thick liquids at home. Prior to consuming nectar liquids,  pt stated "I'v had to learn how to drink differently by gulping my liquids". Immediate throat clearing and significant wet vocal quality present following consumption of approximately 3 oz in two large sips (with chin tuck posture). Educated pt re: clinical reasoning for small sips which pt demonstrated and appeared to prevent s/s aspiration. Pt able to recall need and importance of strategies (due to coughing and "my achalasia"), however exhibits difficulty integrating the big picture and how dysphagia impacts his overall medical status). SLP suspects cognitive deficits ((hypoxemia over extended period ?). Behavioral consistency of strategies would decrease aspiration risk in light of history of oropharyngeal cancer and achalasia (sit upright, chin tuck, small sips, stay up minimum 30 minutes after meals). Pt's shoulders are too broad to fit for MBS and FEES not available at [facility]. Recommend Dys 3, texture and meds whole in applesauce, small sips, chin tuck, stay up minimum 30 min after meals, no straws. ST will follow.l    Aspiration Risk   (mild-mod)    Diet Recommendation Dysphagia 3 (Mech soft);Nectar-thick liquid   Liquid Administration via: Cup;No straw Medication Administration: Whole meds with puree Supervision: Patient able to self feed Compensations: Slow rate;Small sips/bites;Chin tuck Postural Changes: Seated upright at 90 degrees;Remain upright for at least 30 minutes after po intake    Other  Recommendations Oral Care Recommendations: Oral care BID Other Recommendations: Order thickener from pharmacy   Follow up Recommendations   (tbd)    Frequency and Duration min 2x/week  2 weeks  Prognosis Prognosis for Safe Diet Advancement:  (fiar-good) Barriers to Reach Goals: Cognitive deficits;Behavior      Swallow Study   General HPI: 69 year old male with PMH:  OHS on home O2, TBI, HTN,, Pulmonary HTN, dysphagia, oropharyngeal cancer, achalasia. BSE 08/22/2015  s/s aspiration present, hesitant but agreeable to nectar liquids, Dys 3 with chin tuck (decreased, not eliminated s/s aspiration during BSE). Admitted 12/20 due to acute decompensation with hypoxemia and O2 sats in 60s at home. CXR multi focal pneumonia, small hiatal hernia with patulous appearance esophagus (i.e widened area often associated with Barrett's Esophagus) containing oral content. Speech Pathology assessment recommended in CT report. Type of Study: Bedside Swallow Evaluation Previous Swallow Assessment:  (see HPI) Diet Prior to this Study: NPO Temperature Spikes Noted: No Respiratory Status: Nasal cannula History of Recent Intubation: No Behavior/Cognition: Alert;Cooperative;Pleasant mood;Requires cueing;Distractible Oral Cavity Assessment: Within Functional Limits Oral Care Completed by SLP: No Oral Cavity - Dentition: Adequate natural dentition Vision: Functional for self-feeding Self-Feeding Abilities: Able to feed self Patient Positioning: Upright in bed Baseline Vocal Quality: Normal Volitional Cough: Strong Volitional Swallow: Able to elicit    Oral/Motor/Sensory Function Overall Oral Motor/Sensory Function: Within functional limits   Ice Chips Ice chips: Not tested   Thin Liquid Thin Liquid: Not tested    Nectar Thick Nectar Thick Liquid: Impaired Presentation: Cup Pharyngeal Phase Impairments: Wet Vocal Quality;Throat Clearing - Immediate   Honey Thick Honey Thick Liquid: Not tested   Puree Puree: Within functional limits   Solid Solid: Within functional limits       Houston Siren 09/02/2015,3:05 PM  Orbie Pyo Colvin Caroli.[employer] [phone]

## 2015-09-02 NOTE — Plan of Care (Signed)
Problem: Safety: Goal: Ability to remain free from injury will improve Outcome: Progressing Discussed with pt and wife unit based fall preventions and need for bed alarm

## 2015-09-02 NOTE — Progress Notes (Signed)
  Echocardiogram 2D Echocardiogram has been performed.  Wesley [name] 09/02/2015, 1:31 PM

## 2015-09-02 NOTE — Progress Notes (Signed)
Pharmacy Brief Note (cefepime dosing)  70 YOM with multilobar PNA failing outpatient antibiotics after discharge from hospital. Currently on vancomycin, cefepime, azithromycin.  Plan 1. Based on obesity, change cefepime to 2gm IV q24h  Doreene Eland, PharmD, BCPS.   Pager: [phone] 09/02/2015 1:49 PM

## 2015-09-03 ENCOUNTER — Inpatient Hospital Stay (HOSPITAL_COMMUNITY): Payer: Medicare Other

## 2015-09-03 LAB — BASIC METABOLIC PANEL
ANION GAP: 10 (ref 5–15)
BUN: 60 mg/dL — ABNORMAL HIGH (ref 6–20)
CALCIUM: 8.7 mg/dL — AB (ref 8.9–10.3)
CHLORIDE: 100 mmol/L — AB (ref 101–111)
CO2: 39 mmol/L — AB (ref 22–32)
Creatinine, Ser: 2.92 mg/dL — ABNORMAL HIGH (ref 0.61–1.24)
GFR calc non Af Amer: 20 mL/min — ABNORMAL LOW (ref >60)
GFR, EST AFRICAN AMERICAN: 24 mL/min — AB (ref >60)
Glucose, Bld: 107 mg/dL — ABNORMAL HIGH (ref 65–99)
POTASSIUM: 4.8 mmol/L (ref 3.5–5.1)
Sodium: 149 mmol/L — ABNORMAL HIGH (ref 135–145)

## 2015-09-03 LAB — PROCALCITONIN: PROCALCITONIN: 0.37 ng/mL

## 2015-09-03 LAB — CBC
HEMATOCRIT: 26 % — AB (ref 39.0–52.0)
HEMOGLOBIN: 7.6 g/dL — AB (ref 13.0–17.0)
MCH: 25.2 pg — ABNORMAL LOW (ref 26.0–34.0)
MCHC: 29.2 g/dL — ABNORMAL LOW (ref 30.0–36.0)
MCV: 86.4 fL (ref 78.0–100.0)
Platelets: 164 10*3/uL (ref 150–400)
RBC: 3.01 MIL/uL — AB (ref 4.22–5.81)
RDW: 18.4 % — ABNORMAL HIGH (ref 11.5–15.5)
WBC: 12.7 10*3/uL — AB (ref 4.0–10.5)

## 2015-09-03 MED ORDER — LIP MEDEX EX OINT
TOPICAL_OINTMENT | CUTANEOUS | Status: AC
  Administered 2015-09-03: 1
  Filled 2015-09-03: qty 7

## 2015-09-03 MED ORDER — AMLODIPINE BESYLATE 10 MG PO TABS
10.0000 mg | ORAL_TABLET | Freq: Every day | ORAL | Status: DC
  Administered 2015-09-03 – 2015-09-08 (×6): 10 mg via ORAL
  Filled 2015-09-03 (×6): qty 1

## 2015-09-03 MED ORDER — METOPROLOL TARTRATE 50 MG PO TABS
100.0000 mg | ORAL_TABLET | Freq: Two times a day (BID) | ORAL | Status: DC
  Administered 2015-09-03 – 2015-09-08 (×10): 100 mg via ORAL
  Filled 2015-09-03 (×4): qty 2
  Filled 2015-09-03: qty 4
  Filled 2015-09-03 (×4): qty 2
  Filled 2015-09-03: qty 4
  Filled 2015-09-03 (×2): qty 2

## 2015-09-03 MED ORDER — PANTOPRAZOLE SODIUM 40 MG PO TBEC
40.0000 mg | DELAYED_RELEASE_TABLET | Freq: Every day | ORAL | Status: DC
Start: 2015-09-03 — End: 2015-09-08
  Administered 2015-09-03 – 2015-09-08 (×6): 40 mg via ORAL
  Filled 2015-09-03 (×7): qty 1

## 2015-09-03 MED ORDER — SERTRALINE HCL 50 MG PO TABS
150.0000 mg | ORAL_TABLET | Freq: Every day | ORAL | Status: DC
  Administered 2015-09-03 – 2015-09-07 (×5): 150 mg via ORAL
  Filled 2015-09-03 (×5): qty 1

## 2015-09-03 MED ORDER — PRAVASTATIN SODIUM 20 MG PO TABS
20.0000 mg | ORAL_TABLET | Freq: Every day | ORAL | Status: DC
  Administered 2015-09-03 – 2015-09-07 (×5): 20 mg via ORAL
  Filled 2015-09-03 (×7): qty 1

## 2015-09-03 MED ORDER — LEVOTHYROXINE SODIUM 125 MCG PO TABS
125.0000 ug | ORAL_TABLET | Freq: Every day | ORAL | Status: DC
  Administered 2015-09-04 – 2015-09-08 (×5): 125 ug via ORAL
  Filled 2015-09-03 (×5): qty 1

## 2015-09-03 NOTE — Progress Notes (Signed)
PULMONARY / CRITICAL CARE MEDICINE   Name: Wesley [name] MRN: [id number] DOB: 21-Oct-1945    ADMISSION DATE:  09/01/2015 CONSULTATION DATE:  09/01/2015  REFERRING MD:  EDP Pfeiffer  CHIEF COMPLAINT:  SOB  SUBJECTIVE:  Remains off bipap.    VITAL SIGNS: BP 163/71 mmHg  Pulse 74  Temp(Src) 97.6 F (36.4 C) (Oral)  Resp 21  Ht 5\' 10"  (1.778 m)  Wt 299 lb 6.2 oz (135.8 kg)  BMI 42.96 kg/m2  SpO2 89%  HEMODYNAMICS:    VENTILATOR SETTINGS:    INTAKE / OUTPUT: I/O last 3 completed shifts: In: 790 [P.O.:240; IV Piggyback:550] Out: 3075 [Urine:3075]  PHYSICAL EXAMINATION: General:  Morbidly obese male in NAD sitting OOB in chair  Neuro:  Spontaneously awake, alert, oriented, mild confusion at times HEENT:  [place]/AT, PERRL, difficult to discern JVD due to body habitus.  Cardiovascular:  RRR, no MRG. 2+ BLE edema Lungs:  resps even non labored on [place], Rhonchi R>L, no audible wheeze  Abdomen:  Obese, soft, non-tender, non-distended Musculoskeletal:  No acute deformity or ROM limiation Skin:  Grossly intact, ecchymotic area to RLQ abodmen  LABS:  BMET  Recent Labs Lab 09/01/15 2030 09/02/15 0640 09/03/15 0406  NA 147* 145 149*  K 5.2* 4.8 4.8  CL 101 97* 100*  CO2 39* 37* 39*  BUN 55* 53* 60*  CREATININE 2.54* 2.77* 2.92*  GLUCOSE 127* 164* 107*    Electrolytes  Recent Labs Lab 09/01/15 2030 09/02/15 0640 09/03/15 0406  CALCIUM 8.7* 8.4* 8.7*  MG  --  1.8  --   PHOS  --  4.8*  --     CBC  Recent Labs Lab 09/01/15 2030 09/02/15 0640 09/03/15 0406  WBC 19.4* 13.3* 12.7*  HGB 8.6* 7.5* 7.6*  HCT 30.5* 27.1* 26.0*  PLT 175 163 164    Coag's No results for input(s): APTT, INR in the last 168 hours.  Sepsis Markers  Recent Labs Lab 09/01/15 2030 09/02/15 0640 09/03/15 0406  PROCALCITON 0.33 0.48 0.37    ABG  Recent Labs Lab 09/01/15 2059 09/01/15 2137 09/02/15 0030  PHART 7.210* 7.225* 7.249*  PCO2ART 95.3* 94.5* 87.6*  PO2ART  75.1* 81.3 67.2*    Liver Enzymes  Recent Labs Lab 09/01/15 2030  AST 14*  ALT 20  ALKPHOS 73  BILITOT 0.4  ALBUMIN 2.9*    Cardiac Enzymes  Recent Labs Lab 09/01/15 2030 09/02/15 0100 09/02/15 0640  TROPONINI 0.06* 0.08* 0.09*    Glucose No results for input(s): GLUCAP in the last 168 hours.  Imaging Dg Chest Port 1 View  09/03/2015  CLINICAL DATA:  Pneumonia. EXAM: PORTABLE CHEST 1 VIEW COMPARISON:  Chest CT and radiographs 09/01/2015 FINDINGS: Cardiac silhouette is partially obscured but again appears enlarged. Multifocal airspace consolidation is again seen bilaterally, greatest in the bases. Right basilar consolidation has mildly improved from the prior radiographs. No large pleural effusion or pneumothorax is identified. IMPRESSION: Bilateral lung opacities compatible with pneumonia. Mild improvement of right base aeration. Electronically Signed   By: Logan Bores M.D.   On: 09/03/2015 07:10     STUDIES:  PSG 06/03/13 >> AHI 5, SpO2 low 82%, REM 16. 101 min with SpO2 < 88%. Needed 1 liter oxygen. ONO with RA 06/05/13 >> Test time 8 hrs 31 min. Mean SpO2 84%, low SpO2 59%. Spent 8 hrs 1 min with SpO2 < 88%. ONO with 2 liters 06/27/13 >> Test time 7 hrs 25 min. Basal SpO2 91.9%, low SpO2 76%. Spent  11 min with SpO2 < 88%. Echo 07/07/2015 >  Could not eval LVEF (prior study 55-60%), mod-severe AS (new from prior) CT chest  12/20 >>> multifocal PNA, small hiatal hernia with patulous appearing esophagus.  Echo  12/20 >>> EF [id number], Grade 2 diastolic dysfunction, dilated RA   CULTURES: Blood 12/20 >  Sputum 12/20 > Urine strep 12/20 > Urine legionella 12/20 > RVP 12/21>>>  ANTIBIOTICS: Cefepime 12/20 > Vancomycin 12/20 > Azithromycin 12/20 >  SIGNIFICANT EVENTS: 12/17 discharged on oral ABX for PNA 12/20 re-admit for PNA, Resp failure on BiPAP  LINES/TUBES:   DISCUSSION: 69 year old male admitted with acute respiratory failure due to recurrent PNA  possibly r/t aspiration with a likely component of pulmonary edema. Requiring BiPAP at time of admission, mentation has improved well, but ABG is lagging. Treating with broad ABX and add azithro in case atypical infection. Diuresing gently. Close monitoring in ICU. Full Code.  ASSESSMENT / PLAN:  PULMONARY A: Acute on chronic respiratory failure with hypoxemia and hypercarbia Bilateral infiltrates likely PNA, concern for ongoing aspiration. Pulmonary edema likely contributing as well.  Pulmonary Hypertension, secondary Respiratory acidosis > slow to improve on BiPAP but mentation improved OHS not on nocturnal NIMV P:   Supplemental O2 as needed  Swallow eval as below  Diurese per cardiology section ABX per ID section  CARDIOVASCULAR A:  Acute on chronic CHF -- EF [id number], grade 2 diastolic dysfunction on echo.  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   GASTROINTESTINAL A:   GERD Chronic aspiration P:   NPO Speech eval - passed bedside eval with techniques and diet modifications previous prescribed -- for MBS today.  FEES less helpful in this situation.   Protonix daily for SUP May ultimately need repeat PEG - had previously   HEMATOLOGIC A:   Anemia - hemoglobin at baseline P:  Heparin for VTE prophylaxis SCDs Trend CBC  INFECTIOUS A:   SIRS/sepsis secondary to PNA > concern for HCAP vs viral vs atypical  P:   ABX and cultures as above Trend PCT Has struggled with transition to PO abx several times now, consider prolonged course IV antibiotics +/- further GI w/u  PCT  ENDOCRINE A:   Hypothyroid Recently on steroids as inpatient, finished  taper 12/20  P:   Change synthroid back to PO   NEUROLOGIC A:   Acute metabolic encephalopathy  P:   RASS goal: 0 Resume home zoloft  Cont hold seroquel, neurontin    FAMILY  - Updates: Patient and wife updated at length 12/22.   - Inter-disciplinary family meet or Palliative Care meeting due by:  12/27  tx tele, will ask [facility] to assume care 12/23.  PCCM will cont follow for pulm consult.   Nickolas Madrid, NP 09/03/2015  10:01 AM Pager: [phone] or [phone]

## 2015-09-03 NOTE — Procedures (Addendum)
Objective Swallowing Evaluation: MBS-Modified Barium Swallow Study  Patient Details  Name: Wesley [name] MRN: [id number] Date of Birth: 1946/05/16  Today's Date: 09/03/2015 Time: SLP Start Time (ACUTE ONLY): 1230-SLP Stop Time (ACUTE ONLY): 1250 SLP Time Calculation (min) (ACUTE ONLY): 20 min  Past Medical History:  Past Medical History  Diagnosis Date  . Chronic kidney disease     Stage IV  . 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 HTN (hypertension)   . Aortic stenosis     mod-severe  . Morbid obesity ([place])   . Pulmonary hypertension ([place])     secondary  . Obesity hypoventilation syndrome ([facility])     Intolerant of NIMV  . Achalasia   . PTSD (post-traumatic stress disorder)   . Hypothyroidism   . TBI (traumatic brain injury) ([place])   . 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 Knee arthroscopy  11/08/2002    right knee examination  . Knee arthroscopy w/ partial medial meniscectomy  11/08/2002  . Chondroplasty  11/08/2002    extensive, of the patellofemoral joint  . Chondroplasty  11/08/2002    lesser extent chondroplasty medial femoral condyle   HPI: 69 year old male with PMH:  OHS on home O2, TBI, HTN,, Pulmonary HTN, dysphagia, oropharyngeal cancer, achalasia. BSE 08/22/2015 s/s aspiration present, hesitant but agreeable to nectar liquids, Dys 3 with chin tuck (decreased, not eliminated s/s aspiration during BSE). Admitted 12/20 due to acute decompensation with hypoxemia and O2 sats in 60s at home. CXR multi focal pneumonia, small hiatal hernia with patulous appearance esophagus (i.e widened area often associated with Barrett's Esophagus) containing oral content. Speech Pathology assessment recommended in CT report. Pt able to have MBS in the newly revevated xray room for bariatric population.   Subjective: pt describes a long h/o dysphagia (oropharyngeal and esophageal in nature)  Assessment / Plan /  Recommendation  CHL IP CLINICAL IMPRESSIONS 09/03/2015  Therapy Diagnosis Moderate pharyngeal phase dysphagia  Clinical Impression Pt exhibited moderate motor based pharyngeal dysphagia due to decreased laryngeal closure resulting in silent aspiration of thin barium while utilizing a chin tuck posture. Intermittent mild penetration after the swallow from trace-mild vallecular residue removed with cues for volitional cough and x 1 during swallow due to large sip. Upper portion only of esophagus viewed due to inability to lower xray machine with width of chair with barium stasis in wide proximal esophagus. Recommend Dys 3 texture and nectar thick liquids with continuing chin tuck posture, volitional throat clear/cough after every several sips, alternate bites and sips, small sips and stay upright 1 hour after meals. Aspiration risk is chronic due to significant esophageal dysphagia (previously diagnosed) and pt's difficulty complying with safe swallow precautions primarily due to cognitive/memory deficits.      Impact on safety and function (No Data)      CHL IP TREATMENT RECOMMENDATION 09/03/2015  Treatment Recommendations Therapy as outlined in treatment plan below     Prognosis 09/03/2015  Prognosis for Safe Diet Advancement Fair  Barriers to Reach Goals Cognitive deficits;Behavior  Barriers/Prognosis Comment --    CHL IP DIET RECOMMENDATION 09/03/2015  SLP Diet Recommendations Dysphagia 3 (Mech soft) solids;Nectar thick liquid  Liquid Administration via Cup;No straw  Medication Administration Whole meds with puree  Compensations Small sips/bites;Slow rate;Follow solids with liquid;Clear throat intermittently;Chin tuck  Postural Changes Seated upright at 90 degrees;Remain semi-upright after after feeds/meals (Comment)  CHL IP OTHER RECOMMENDATIONS 09/03/2015  Recommended Consults --  Oral Care Recommendations Oral care BID  Other Recommendations --      CHL IP FOLLOW UP  RECOMMENDATIONS 09/02/2015  Follow up Recommendations (No Data)      CHL IP FREQUENCY AND DURATION 09/03/2015  Speech Therapy Frequency (ACUTE ONLY) min 2x/week  Treatment Duration 2 weeks           CHL IP ORAL PHASE 09/03/2015  Oral Phase WFL  Oral - Pudding Teaspoon --  Oral - Pudding Cup --  Oral - Honey Teaspoon --  Oral - Honey Cup --  Oral - Nectar Teaspoon --  Oral - Nectar Cup --  Oral - Nectar Straw --  Oral - Thin Teaspoon --  Oral - Thin Cup --  Oral - Thin Straw --  Oral - Puree --  Oral - Mech Soft --  Oral - Regular --  Oral - Multi-Consistency --  Oral - Pill --  Oral Phase - Comment --    CHL IP PHARYNGEAL PHASE 09/03/2015  Pharyngeal Phase Impaired  Pharyngeal- Pudding Teaspoon --  Pharyngeal --  Pharyngeal- Pudding Cup --  Pharyngeal --  Pharyngeal- Honey Teaspoon --  Pharyngeal --  Pharyngeal- Honey Cup --  Pharyngeal --  Pharyngeal- Nectar Teaspoon --  Pharyngeal --  Pharyngeal- Nectar Cup Penetration/Apiration after swallow;Pharyngeal residue - valleculae;Reduced tongue base retraction  Pharyngeal Material enters airway, remains ABOVE vocal cords and not ejected out  Pharyngeal- Nectar Straw --  Pharyngeal --  Pharyngeal- Thin Teaspoon --  Pharyngeal --  Pharyngeal- Thin Cup Penetration/Aspiration during swallow;Pharyngeal residue - valleculae;Reduced airway/laryngeal closure;Reduced tongue base retraction  Pharyngeal Material enters airway, passes BELOW cords without attempt by patient to eject out (silent aspiration)  Pharyngeal- Thin Straw --  Pharyngeal --  Pharyngeal- Puree --  Pharyngeal --  Pharyngeal- Mechanical Soft --  Pharyngeal --  Pharyngeal- Regular Pharyngeal residue - valleculae;Reduced tongue base retraction  Pharyngeal --  Pharyngeal- Multi-consistency --  Pharyngeal --  Pharyngeal- Pill Pharyngeal residue - valleculae;Reduced tongue base retraction  Pharyngeal --  Pharyngeal Comment --     CHL IP CERVICAL  ESOPHAGEAL PHASE 09/03/2015  Cervical Esophageal Phase WFL  Pudding Teaspoon --  Pudding Cup --  Honey Teaspoon --  Honey Cup --  Nectar Teaspoon --  Nectar Cup --  Nectar Straw --  Thin Teaspoon --  Thin Cup --  Thin Straw --  Puree --  Mechanical Soft --  Regular --  Multi-consistency --  Pill --  Cervical Esophageal Comment --     Houston Siren 09/03/2015, 1:47 PM  Orbie Pyo Johnattan Strassman M.Ed [employer] [phone]

## 2015-09-03 NOTE — Progress Notes (Signed)
Initial Nutrition Assessment  DOCUMENTATION CODES:   Morbid obesity  INTERVENTION:  - Will order Magic Cup once/day and will monitor for acceptance, each supplement provides 290 kcal and 9 grams of protein - Will continue to assist pt's wife with educational needs based on recommended diet texture - RD will continue to monitor for needs  NUTRITION DIAGNOSIS:   Swallowing difficulty related to dysphagia as evidenced by per patient/family report.  GOAL:   Patient will meet greater than or equal to 90% of their needs  MONITOR:   PO intake, Weight trends, Labs, I & O's  REASON FOR ASSESSMENT:   Other (Comment) (Request per wife)  ASSESSMENT:   69 year old male with PMH as below, which includes OHS on home O2 (followed by [facility]), TBI, HTN,, Pulmonary HTN secondary to mod-severe AS. He has 2 recent admission for hypoxemic respiratory failure thought secondary to aspiration PNA. He was initially treated with IV antibiotics which were switched to PO with subsequent deterioration. He was restarted on IV, and was eventually discharged on PO cefpodoxime and flagyl. He has a remote history of achalasia and often has swallowing difficulties increasing concern for aspitration. He was evaluated by ST during last admission who recommended dysphagia 3 diet and nectar thick. After discharge 12/17 he continued to feel week but had no complaints of SOB/CP/Cough. 12/20 he had acute decompensation with hypoxemia and O2 sats in 60s at home. Wife turned home O2 (chronically 3 L) up to 10 liters with no improvement. He was also lethargic. Wife called EMS. Upon arrival to ED ABG was obtained and demonstrated respiratory acidosis. He was started on BiPAP. His mental status improved, however ABG did not. CXR demonstrated persistent Bilateral, but primarily RML/RLL opacification.   Pt seen per wife's request. BMI indicates morbid obesity. Pt on Dysphagia 3, Nectar-thick liquids and had MBS this afternoon; SLP note  today at 1347 indicates that following this testing continue to recommend current diet order. Per chart review, pt ate 75% of lunch yesterday (12/21) and 100% of breakfast this AM.   Talked with pt and wife on the way to radiology for MBS this afternoon as well as talking with wife further in the waiting area of radiology. Will also follow-up 12/23 as unable to answer all of pt's wife's questions at time of visit as she was then meeting up with pt in the room for MBS. Pt reports that since admission and PTA he has maintained a very good appetite. Chart review indicates that pt has gained 16 lbs in the past 1 month; will continue to monitor weight trends throughout hospitalization.   Pt's wife expresses concern that on previous hospitalizations she has requested to talk with RD concerning diet texture and that staff members have informed her that this would not be able to occur and have simply provided her with a handout. Apologized to wife for this occuring and ensured her that RD would assist as able during this hospitalization. She asks multiple questions about food texture and about liquids. Provided her with handout from the [employer] outlining appropriate foods on Dysphagia 3 diet but did make not of items listed within this that would be considered thin liquids (for example: ice cream) and that pt would be unable to have due to need for thickened liquids. Talked with wife about appropriate and inappropriate foods from each food group on Dysphagia 3 diet and deferred questions concerning liquids/thickened liquids to SLP as RD feels SLP is a much  better resource for the wife concerning this topic. Informed wife that RD would contact Patient Risk analyst as this individual will be able to better respond to questions concerning food service and foods available within the hospital. Called Patient Services manager earlier this afternoon but reached voicemail and requested that this  individual visit pt's room either this evening or tomorrow between 0800 and 1000 as wife indicated she would be with pt during this time frame.  RD to follow-u If this continues to be a recurring issue problem then he may need a PEG ultimately. p 12/23 to further assist as needed.  NP note today at [phone] indicates with respect to aspiration PNA: If this continues to be a recurring issue problem then he may need a PEG ultimately.  Notes indicate that pt has had a PEG in the past.   Pt likely meeting/minimally meeting needs at this time. Medications reviewed. Labs reviewed; Na: 149 mmol/L, Cl: 100 mmol/L, BUN/creatinine elevated and trending up (pt with stage 4 CKD), Ca: 8.7 mg/dL, Phos: 4.8 mg/dL, GFR: 20.   Diet Order:  DIET DYS 3 Room service appropriate?: Yes; Fluid consistency:: Nectar Thick  Skin:  Reviewed, no issues  Last BM:  12/21  Height:   Ht Readings from Last 1 Encounters:  09/02/15 5\' 10"  (1.778 m)    Weight:   Wt Readings from Last 1 Encounters:  09/02/15 299 lb 6.2 oz (135.8 kg)    Ideal Body Weight:  75.45 kg (kg)  BMI:  Body mass index is 42.96 kg/(m^2).  Estimated Nutritional Needs:   Kcal:  [id number]  Protein:  100-120 grams  Fluid:  2.2-2.5 L/day  EDUCATION NEEDS:   No education needs identified at this time     Jarome Matin, RD, LDN Inpatient Clinical Dietitian Pager # [phone] After hours/weekend pager # [phone]

## 2015-09-03 NOTE — Progress Notes (Signed)
Patient blood pressure 180/88 at 1800. Unable to given PRN metoprolol at this time. MD made aware. Orders received at this time. Will continue to monitor.

## 2015-09-04 LAB — RESPIRATORY VIRUS PANEL
Adenovirus: NEGATIVE
INFLUENZA A: NEGATIVE
INFLUENZA B 1: NEGATIVE
METAPNEUMOVIRUS: NEGATIVE
PARAINFLUENZA 1 A: NEGATIVE
PARAINFLUENZA 2 A: NEGATIVE
PARAINFLUENZA 3 A: NEGATIVE
RESPIRATORY SYNCYTIAL VIRUS A: NEGATIVE
Respiratory Syncytial Virus B: NEGATIVE
Rhinovirus: NEGATIVE

## 2015-09-04 LAB — BASIC METABOLIC PANEL
ANION GAP: 11 (ref 5–15)
BUN: 57 mg/dL — ABNORMAL HIGH (ref 6–20)
CO2: 40 mmol/L — AB (ref 22–32)
Calcium: 8.8 mg/dL — ABNORMAL LOW (ref 8.9–10.3)
Chloride: 96 mmol/L — ABNORMAL LOW (ref 101–111)
Creatinine, Ser: 2.69 mg/dL — ABNORMAL HIGH (ref 0.61–1.24)
GFR calc Af Amer: 26 mL/min — ABNORMAL LOW (ref >60)
GFR calc non Af Amer: 22 mL/min — ABNORMAL LOW (ref >60)
GLUCOSE: 104 mg/dL — AB (ref 65–99)
POTASSIUM: 4.7 mmol/L (ref 3.5–5.1)
Sodium: 147 mmol/L — ABNORMAL HIGH (ref 135–145)

## 2015-09-04 LAB — CBC
HEMATOCRIT: 28.8 % — AB (ref 39.0–52.0)
Hemoglobin: 8.3 g/dL — ABNORMAL LOW (ref 13.0–17.0)
MCH: 25 pg — AB (ref 26.0–34.0)
MCHC: 28.8 g/dL — ABNORMAL LOW (ref 30.0–36.0)
MCV: 86.7 fL (ref 78.0–100.0)
Platelets: 173 10*3/uL (ref 150–400)
RBC: 3.32 MIL/uL — AB (ref 4.22–5.81)
RDW: 18.2 % — AB (ref 11.5–15.5)
WBC: 10.7 10*3/uL — AB (ref 4.0–10.5)

## 2015-09-04 LAB — EXPECTORATED SPUTUM ASSESSMENT W REFEX TO RESP CULTURE

## 2015-09-04 LAB — EXPECTORATED SPUTUM ASSESSMENT W GRAM STAIN, RFLX TO RESP C: Special Requests: NORMAL

## 2015-09-04 LAB — PROCALCITONIN: Procalcitonin: 0.35 ng/mL

## 2015-09-04 MED ORDER — HYDRALAZINE HCL 20 MG/ML IJ SOLN
10.0000 mg | Freq: Four times a day (QID) | INTRAMUSCULAR | Status: DC | PRN
  Filled 2015-09-04: qty 1

## 2015-09-04 NOTE — Progress Notes (Signed)
Speech Language Pathology Treatment: Dysphagia  Patient Details Name: Wesley [name] MRN: [id number] DOB: 1945/12/25 Today's Date: 09/04/2015 Time: [id number] SLP Time Calculation (min) (ACUTE ONLY): 23 min  Assessment / Plan / Recommendation Clinical Impression  Dysphagia treatment with wife present and observed (with verbal education) thickening milk. Vocal quality wet prior to po's. Delayed, minimal throat clear x 1 (not volitional). Reviewed swallow precautions and pt able to follow after 2 minute delay ( volitional throat clear).  During MBS yesterday pt's wife verbalized frustration re: third visit in a month for "same problem." SLP explained dynamic nature of dysphagia dependent of many factors throughout a pt's day (pt's alertness, energy level, memory, compliance with precautions). She voiced understanding of pt's risk due to prior tonsilar cancer and radiation and esophageal achalasia. Continue treatment. May benefit from home health ST upon discharge.     HPI HPI: 69 year old male with PMH:  OHS on home O2, TBI, HTN,, Pulmonary HTN, dysphagia, oropharyngeal cancer, achalasia. BSE 08/22/2015 s/s aspiration present, hesitant but agreeable to nectar liquids, Dys 3 with chin tuck (decreased, not eliminated s/s aspiration during BSE). Admitted 12/20 due to acute decompensation with hypoxemia and O2 sats in 60s at home. CXR multi focal pneumonia, small hiatal hernia with patulous appearance esophagus (i.e widened area often associated with Barrett's Esophagus) containing oral content. Speech Pathology assessment recommended in CT report. Pt able to have MBS in the newly revevated xray room for bariatric population.       SLP Plan  Continue with current plan of care     Recommendations  Diet recommendations: Dysphagia 3 (mechanical soft);Nectar-thick liquid Liquids provided via: Cup;No straw Medication Administration: Whole meds with puree Supervision: Patient able to self feed;Full  supervision/cueing for compensatory strategies Compensations: Small sips/bites;Slow rate;Follow solids with liquid;Clear throat intermittently;Chin tuck Postural Changes and/or Swallow Maneuvers: Chin tuck;Seated upright 90 degrees;Upright 30-60 min after meal              Oral Care Recommendations: Oral care BID Follow up Recommendations: Home health SLP Plan: Continue with current plan of care   [place] Siren 09/04/2015, 10:15 AM  Orbie Pyo Colvin Caroli.Ed [employer] [phone]

## 2015-09-04 NOTE — Evaluation (Signed)
Physical Therapy Evaluation Patient Details Name: Wesley [name] MRN: [id number] DOB: 1945-09-16 Today's Date: 09/04/2015   History of Present Illness  pt adm 09/01/15 with acute on chronic hypercarbic respiratory failure; PMHx:htn, obesity hypoventilation syndrome, CKD   Clinical Impression  Pt admitted with above diagnosis. Pt currently with functional limitations due to the deficits listed below (see PT Problem List).  Pt will benefit from skilled PT to increase their independence and safety with mobility to allow discharge to the venue listed below.    pt fatigues quickly; VS: O2 sats  92%-->86%-->90% on 6L           HR 70s--80s       Follow Up Recommendations Supervision - Intermittent;Home health PT    Equipment Recommendations  None recommended by PT    Recommendations for Other Services       Precautions / Restrictions Precautions Precautions: Fall Precaution Comments: O2 dep Restrictions Weight Bearing Restrictions: No      Mobility  Bed Mobility               General bed mobility comments: NT--pt inrecliner--he sleeps in recliner at home  Transfers Overall transfer level: Needs assistance Equipment used: 1 person hand held assist Transfers: Sit to/from Stand Sit to Stand: Min assist;+2 safety/equipment            Ambulation/Gait Ambulation/Gait assistance: +2 safety/equipment;Min assist Ambulation Distance (Feet): 30 Feet Assistive device: None;1 person hand held assist Gait Pattern/deviations: Step-through pattern;Wide base of support;Decreased stride length     General Gait Details: cues for posture, breathing, step length  Stairs            Wheelchair Mobility    Modified Rankin (Stroke Patients Only)       Balance             Standing balance-Leahy Scale: Fair                               Pertinent Vitals/Pain Pain Assessment: No/denies pain    Home Living Family/patient expects to be discharged  to:: Private residence Living Arrangements: Spouse/significant other   Type of Home: House       Home Layout: Two level Home Equipment: [place] - single point Additional Comments: pt reports he goes up/down stairs once per day; pt also reports they are going to get a chair-lift; he also stataes he will stay on the main floor and sleep in his recliner    Prior Function Level of Independence: Independent with assistive device(s)         Comments: chronic 4L O2     Hand Dominance        Extremity/Trunk Assessment   Upper Extremity Assessment: Defer to OT evaluation           Lower Extremity Assessment: Generalized weakness         Communication   Communication: No difficulties  Cognition Arousal/Alertness: Awake/alert Behavior During Therapy: WFL for tasks assessed/performed Overall Cognitive Status: Within Functional Limits for tasks assessed                      General Comments      Exercises        Assessment/Plan    PT Assessment Patient needs continued PT services  PT Diagnosis Difficulty walking   PT Problem List Decreased activity tolerance;Decreased mobility;Cardiopulmonary status limiting activity  PT Treatment Interventions DME instruction;Gait training;Functional mobility training;Therapeutic  activities;Therapeutic exercise;Patient/family education   PT Goals (Current goals can be found in the Care Plan section) Acute Rehab PT Goals Patient Stated Goal: none stated PT Goal Formulation: With patient Time For Goal Achievement: 09/11/15 Potential to Achieve Goals: Fair    Frequency Min 3X/week   Barriers to discharge        Co-evaluation               End of Session Equipment Utilized During Treatment: Gait belt;Oxygen Activity Tolerance: Patient limited by fatigue Patient left: in chair;with call bell/phone within reach;with chair alarm set           Time: 1050-1103 PT Time Calculation (min) (ACUTE ONLY): 13  min   Charges:   PT Evaluation $Initial PT Evaluation Tier I: 1 Procedure     PT G CodesKenyon Ana 2015/09/26, 12:15 PM

## 2015-09-04 NOTE — Progress Notes (Signed)
[facility] PROGRESS NOTE  RAHMEL HASBROUCK [id number] DOB: Dec 17, 1945 DOA: 09/01/2015 PCP: [name], MD  Assessment/Plan: Active Problems:   Acute respiratory failure with hypoxia and hypercarbia ([place]) - Most likely due to aspiration pneumonia - continue supplemental oxygen - pulmonology following  Aspiration pna - continue current antibiotic therapy - speech therapy has evaluated patient and made recommendations. Please refer to their note. Patient aware of recommendations.  Uncontrolled hypertension - will add prn hydralazine - pt currently on amlodipine and metoprolol    Chronic kidney disease (CKD), stage IV (severe) (HCC) - Stable Serum creatinine steady around 2.6    Acute encephalopathy - Most likely due to hypoxia/hypercarbia and toxic encephalopathy secondary to active infection - continue antibiotic therapy - improving.  Code Status: full Family Communication: discussed directly with patient  Disposition Plan: Pending improvement in condition   Consultants:  None  Procedures:  Speech therapy  pccm  Antibiotics:  Azithromycin  Cefepime   Vancomycin  HPI/Subjective: Pt has no new complaints. No acute issues overnight.  Objective: Filed Vitals:   09/04/15 1100 09/04/15 1200  BP: 174/82 164/80  Pulse:    Temp:    Resp: 31 19    Intake/Output Summary (Last 24 hours) at 09/04/15 1220 Last data filed at 09/04/15 1200  Gross per 24 hour  Intake   1350 ml  Output   4275 ml  Net  -2925 ml   Filed Weights   09/02/15 0130  Weight: 135.8 kg (299 lb 6.2 oz)    Exam:   General:  Pt in nad, alert and awake  Cardiovascular: rrr, no mrg  Respiratory: equal chest rise, speaking in broken sentences, no wheezes, + rhales over right lung field  Abdomen: soft, Nd, NT  Musculoskeletal: no cyanosis or clubbing   Data Reviewed: Basic Metabolic Panel:  Recent Labs Lab 09/01/15 2030 09/02/15 0640 09/03/15 0406  09/04/15 0345  NA 147* 145 149* 147*  K 5.2* 4.8 4.8 4.7  CL 101 97* 100* 96*  CO2 39* 37* 39* 40*  GLUCOSE 127* 164* 107* 104*  BUN 55* 53* 60* 57*  CREATININE 2.54* 2.77* 2.92* 2.69*  CALCIUM 8.7* 8.4* 8.7* 8.8*  MG  --  1.8  --   --   PHOS  --  4.8*  --   --    Liver Function Tests:  Recent Labs Lab 09/01/15 2030  AST 14*  ALT 20  ALKPHOS 73  BILITOT 0.4  PROT 6.2*  ALBUMIN 2.9*   No results for input(s): LIPASE, AMYLASE in the last 168 hours. No results for input(s): AMMONIA in the last 168 hours. CBC:  Recent Labs Lab 08/29/15 0828 09/01/15 2030 09/02/15 0640 09/03/15 0406 09/04/15 0345  WBC 13.6* 19.4* 13.3* 12.7* 10.7*  NEUTROABS  --  17.0*  --   --   --   HGB 9.1* 8.6* 7.5* 7.6* 8.3*  HCT 32.8* 30.5* 27.1* 26.0* 28.8*  MCV 88.4 89.7 89.7 86.4 86.7  PLT 218 175 163 164 173   Cardiac Enzymes:  Recent Labs Lab 09/01/15 2030 09/02/15 0100 09/02/15 0640  TROPONINI 0.06* 0.08* 0.09*   BNP (last 3 results)  Recent Labs  04/18/15 1554 08/09/15 0746 09/01/15 2030  BNP 496.3* 657.8* 562.3*    ProBNP (last 3 results) No results for input(s): PROBNP in the last 8760 hours.  CBG: No results for input(s): GLUCAP in the last 168 hours.  Recent Results (from the past 240 hour(s))  Culture, blood (routine x 2)  Status: None (Preliminary result)   Collection Time: 09/01/15  9:00 PM  Result Value Ref Range Status   Specimen Description BLOOD RIGHT ANTECUBITAL  Final   Special Requests BOTTLES DRAWN AEROBIC AND ANAEROBIC 5CC  Final   Culture   Final    NO GROWTH 3 DAYS Performed at [facility]    Report Status PENDING  Incomplete  Culture, blood (routine x 2)     Status: None (Preliminary result)   Collection Time: 09/01/15  9:20 PM  Result Value Ref Range Status   Specimen Description BLOOD BLOOD LEFT HAND  Final   Special Requests IN PEDIATRIC BOTTLE 3ML  Final   Culture   Final    NO GROWTH 3 DAYS Performed at [facility]    Report Status PENDING  Incomplete  Culture, expectorated sputum-assessment     Status: None   Collection Time: 09/04/15  9:30 AM  Result Value Ref Range Status   Specimen Description SPUTUM  Final   Special Requests Normal  Final   Sputum evaluation   Final    THIS SPECIMEN IS ACCEPTABLE. RESPIRATORY CULTURE REPORT TO FOLLOW.   Report Status 09/04/2015 FINAL  Final     Studies: Dg Chest Port 1 View  09/03/2015  CLINICAL DATA:  Pneumonia. EXAM: PORTABLE CHEST 1 VIEW COMPARISON:  Chest CT and radiographs 09/01/2015 FINDINGS: Cardiac silhouette is partially obscured but again appears enlarged. Multifocal airspace consolidation is again seen bilaterally, greatest in the bases. Right basilar consolidation has mildly improved from the prior radiographs. No large pleural effusion or pneumothorax is identified. IMPRESSION: Bilateral lung opacities compatible with pneumonia. Mild improvement of right base aeration. Electronically Signed   By: Logan Bores M.D.   On: 09/03/2015 07:10   Dg Swallowing Func-speech Pathology  09/04/2015  Valere Dross, CCC-SLP     09/04/2015  9:21 AM Objective Swallowing Evaluation: MBS-Modified Barium Swallow Study Patient Details Name: Wesley [name] MRN: [id number] Date of Birth: 07/12/1946 Today's Date: 09/03/2015 Time: SLP Start Time (ACUTE ONLY): 1230-SLP Stop Time (ACUTE ONLY): 1250 SLP Time Calculation (min) (ACUTE ONLY): 20 min Past Medical History: Past Medical History Diagnosis Date . Chronic kidney disease    Stage IV . Oropharyngeal cancer ([place])  . HTN (hypertension)  . Aortic stenosis    mod-severe . Morbid obesity ([place])  . Pulmonary hypertension ([place])    secondary . Obesity hypoventilation syndrome (HCC)    Intolerant of NIMV . Achalasia  . PTSD (post-traumatic stress disorder)  . Hypothyroidism  . TBI (traumatic brain injury) ([place])  . 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  Knee arthroscopy  11/08/2002   right knee examination . Knee arthroscopy w/ partial medial meniscectomy  11/08/2002 . Chondroplasty  11/08/2002   extensive, of the patellofemoral joint . Chondroplasty  11/08/2002   lesser extent chondroplasty medial femoral condyle HPI: 69 year old male with PMH:  OHS on home O2, TBI, HTN,, Pulmonary HTN, dysphagia, oropharyngeal cancer, achalasia. BSE 08/22/2015 s/s aspiration present, hesitant but agreeable to nectar liquids, Dys 3 with chin tuck (decreased, not eliminated s/s aspiration during BSE). Admitted 12/20 due to acute decompensation with hypoxemia and O2 sats in 60s at home. CXR multi focal pneumonia, small hiatal hernia with patulous appearance esophagus (i.e widened area often associated with Barrett's Esophagus) containing oral content. Speech Pathology assessment recommended in CT report. Pt able to have MBS in the newly revevated xray room for bariatric  population.  Subjective: pt describes a long h/o dysphagia (oropharyngeal and esophageal in nature) Assessment / Plan / Recommendation CHL IP CLINICAL IMPRESSIONS 09/03/2015 Therapy Diagnosis Moderate pharyngeal phase dysphagia Clinical Impression Pt exhibited moderate motor based pharyngeal dysphagia due to decreased laryngeal closure resulting in silent aspiration of thin barium while utilizing a chin tuck posture. Intermittent mild penetration after the swallow from trace-mild vallecular residue removed with cues for volitional cough and x 1 during swallow due to large sip. Upper portion only of esophagus viewed due to inability to lower xray machine with width of chair with barium stasis in wide proximal esophagus. Recommend Dys 3 texture and nectar thick liquids with continuing chin tuck posture, volitional throat clear/cough after every several sips, alternate bites and sips, small sips and stay upright 1 hour after meals. Aspiration risk is chronic due to significant esophageal dysphagia (previously diagnosed) and  pt's difficulty complying with safe swallow precautions primarily due to cognitive/memory deficits.     Impact on safety and function (No Data)   CHL IP TREATMENT RECOMMENDATION 09/03/2015 Treatment Recommendations Therapy as outlined in treatment plan below   Prognosis 09/03/2015 Prognosis for Safe Diet Advancement Fair Barriers to Reach Goals Cognitive deficits;Behavior Barriers/Prognosis Comment -- CHL IP DIET RECOMMENDATION 09/03/2015 SLP Diet Recommendations Dysphagia 3 (Mech soft) solids;Nectar thick liquid Liquid Administration via Cup;No straw Medication Administration Whole meds with puree Compensations Small sips/bites;Slow rate;Follow solids with liquid;Clear throat intermittently;Chin tuck Postural Changes Seated upright at 90 degrees;Remain semi-upright after after feeds/meals (Comment)   CHL IP OTHER RECOMMENDATIONS 09/03/2015 Recommended Consults -- Oral Care Recommendations Oral care BID Other Recommendations --   CHL IP FOLLOW UP RECOMMENDATIONS 09/02/2015 Follow up Recommendations (No Data)   CHL IP FREQUENCY AND DURATION 09/03/2015 Speech Therapy Frequency (ACUTE ONLY) min 2x/week Treatment Duration 2 weeks      CHL IP ORAL PHASE 09/03/2015 Oral Phase WFL Oral - Pudding Teaspoon -- Oral - Pudding Cup -- Oral - Honey Teaspoon -- Oral - Honey Cup -- Oral - Nectar Teaspoon -- Oral - Nectar Cup -- Oral - Nectar Straw -- Oral - Thin Teaspoon -- Oral - Thin Cup -- Oral - Thin Straw -- Oral - Puree -- Oral - Mech Soft -- Oral - Regular -- Oral - Multi-Consistency -- Oral - Pill -- Oral Phase - Comment --  CHL IP PHARYNGEAL PHASE 09/03/2015 Pharyngeal Phase Impaired Pharyngeal- Pudding Teaspoon -- Pharyngeal -- Pharyngeal- Pudding Cup -- Pharyngeal -- Pharyngeal- Honey Teaspoon -- Pharyngeal -- Pharyngeal- Honey Cup -- Pharyngeal -- Pharyngeal- Nectar Teaspoon -- Pharyngeal -- Pharyngeal- Nectar Cup Penetration/Apiration after swallow;Pharyngeal residue - valleculae;Reduced tongue base retraction  Pharyngeal Material enters airway, remains ABOVE vocal cords and not ejected out Pharyngeal- Nectar Straw -- Pharyngeal -- Pharyngeal- Thin Teaspoon -- Pharyngeal -- Pharyngeal- Thin Cup Penetration/Aspiration during swallow;Pharyngeal residue - valleculae;Reduced airway/laryngeal closure;Reduced tongue base retraction Pharyngeal Material enters airway, passes BELOW cords without attempt by patient to eject out (silent aspiration) Pharyngeal- Thin Straw -- Pharyngeal -- Pharyngeal- Puree -- Pharyngeal -- Pharyngeal- Mechanical Soft -- Pharyngeal -- Pharyngeal- Regular Pharyngeal residue - valleculae;Reduced tongue base retraction Pharyngeal -- Pharyngeal- Multi-consistency -- Pharyngeal -- Pharyngeal- Pill Pharyngeal residue - valleculae;Reduced tongue base retraction Pharyngeal -- Pharyngeal Comment --  CHL IP CERVICAL ESOPHAGEAL PHASE 09/03/2015 Cervical Esophageal Phase WFL Pudding Teaspoon -- Pudding Cup -- Honey Teaspoon -- Honey Cup -- Nectar Teaspoon -- Nectar Cup -- Nectar Straw -- Thin Teaspoon -- Thin Cup -- Thin Straw -- Puree -- Mechanical Soft -- Regular -- Multi-consistency --  Pill -- Cervical Esophageal Comment -- [place] Siren 09/03/2015, 1:47 PM Orbie Pyo Colvin Caroli.Ed CCC-SLP Pager [phone]               Scheduled Meds: . amLODipine  10 mg Oral Daily  . antiseptic oral rinse  7 mL Mouth Rinse q12n4p  . aspirin EC  81 mg Oral q morning - 10a  . azithromycin  500 mg Intravenous Q24H  . ceFEPime (MAXIPIME) IV  2 g Intravenous Q24H  . chlorhexidine  15 mL Mouth Rinse BID  . heparin  5,000 Units Subcutaneous 3 times per day  . ipratropium-albuterol  3 mL Nebulization QID  . levothyroxine  125 mcg Oral QAC breakfast  . metoprolol tartrate  100 mg Oral BID  . pantoprazole  40 mg Oral Daily  . pravastatin  20 mg Oral q1800  . sertraline  150 mg Oral QHS  . sodium chloride  3 mL Intravenous Q12H  . vancomycin  1,750 mg Intravenous Q48H   Continuous Infusions: . albuterol Stopped  (09/02/15 0038)    Time spent: > 35 minutes   Velvet Bathe  [facility] Pager [phone]. If 7PM-7AM, please contact night-coverage at [email or web address], password [facility] 09/04/2015, 12:20 PM  LOS: 3 days

## 2015-09-04 NOTE — Progress Notes (Signed)
PULMONARY / CRITICAL CARE MEDICINE   Name: Wesley [name] MRN: [id number] DOB: Aug 23, 1946    ADMISSION DATE:  09/01/2015 CONSULTATION DATE:  09/01/2015  REFERRING MD:  EDP Pfeiffer  CHIEF COMPLAINT:  SOB  SUBJECTIVE:  Remains off bipap.    VITAL SIGNS: BP 194/83 mmHg  Pulse 97  Temp(Src) 98.5 F (36.9 C) (Oral)  Resp 20  Ht 5\' 10"  (1.778 m)  Wt 135.8 kg (299 lb 6.2 oz)  BMI 42.96 kg/m2  SpO2 93%  HEMODYNAMICS:    VENTILATOR SETTINGS:    INTAKE / OUTPUT: I/O last 3 completed shifts: In: 2105 [P.O.:950; I.V.:55; IV Piggyback:1100] Out: [id number] [Urine:3915]  PHYSICAL EXAMINATION: General:  Obese male, sitting in chair. Neuro:  Awake, oriented. No distress HEENT:  Moist mucous membranes Cardiovascular:  RRR, no MRG. 2+ BLE edema Lungs:  Clear, no wheeze, crackles Abdomen:  Obese, soft, non-tender, non-distended Musculoskeletal:  No acute deformity or ROM limiation Skin:  Intact  LABS:  BMET  Recent Labs Lab 09/02/15 0640 09/03/15 0406 09/04/15 0345  NA 145 149* 147*  K 4.8 4.8 4.7  CL 97* 100* 96*  CO2 37* 39* 40*  BUN 53* 60* 57*  CREATININE 2.77* 2.92* 2.69*  GLUCOSE 164* 107* 104*    Electrolytes  Recent Labs Lab 09/02/15 0640 09/03/15 0406 09/04/15 0345  CALCIUM 8.4* 8.7* 8.8*  MG 1.8  --   --   PHOS 4.8*  --   --     CBC  Recent Labs Lab 09/02/15 0640 09/03/15 0406 09/04/15 0345  WBC 13.3* 12.7* 10.7*  HGB 7.5* 7.6* 8.3*  HCT 27.1* 26.0* 28.8*  PLT 163 164 173    Coag's No results for input(s): APTT, INR in the last 168 hours.  Sepsis Markers  Recent Labs Lab 09/02/15 0640 09/03/15 0406 09/04/15 0345  PROCALCITON 0.48 0.37 0.35    ABG  Recent Labs Lab 09/01/15 2059 09/01/15 2137 09/02/15 0030  PHART 7.210* 7.225* 7.249*  PCO2ART 95.3* 94.5* 87.6*  PO2ART 75.1* 81.3 67.2*    Liver Enzymes  Recent Labs Lab 09/01/15 2030  AST 14*  ALT 20  ALKPHOS 73  BILITOT 0.4  ALBUMIN 2.9*    Cardiac  Enzymes  Recent Labs Lab 09/01/15 2030 09/02/15 0100 09/02/15 0640  TROPONINI 0.06* 0.08* 0.09*    Glucose No results for input(s): GLUCAP in the last 168 hours.  Imaging Dg Swallowing [facility] Pathology  09/04/2015  Valere Dross, CCC-SLP     09/04/2015  9:21 AM Objective Swallowing Evaluation: MBS-Modified Barium Swallow Study Patient Details Name: Wesley [name] MRN: [id number] Date of Birth: 1945-12-25 Today's Date: 09/03/2015 Time: SLP Start Time (ACUTE ONLY): 1230-SLP Stop Time (ACUTE ONLY): 1250 SLP Time Calculation (min) (ACUTE ONLY): 20 min Past Medical History: Past Medical History Diagnosis Date . Chronic kidney disease    Stage IV . Oropharyngeal cancer ([place])  . HTN (hypertension)  . Aortic stenosis    mod-severe . Morbid obesity ([place])  . Pulmonary hypertension ([place])    secondary . Obesity hypoventilation syndrome (HCC)    Intolerant of NIMV . Achalasia  . PTSD (post-traumatic stress disorder)  . Hypothyroidism  . TBI (traumatic brain injury) ([place])  . 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 Knee arthroscopy  11/08/2002   right knee examination . Knee arthroscopy w/ partial medial meniscectomy  11/08/2002 . Chondroplasty  11/08/2002   extensive, of the patellofemoral joint . Chondroplasty  11/08/2002  lesser extent chondroplasty medial femoral condyle HPI: 68 year old male with PMH:  OHS on home O2, TBI, HTN,, Pulmonary HTN, dysphagia, oropharyngeal cancer, achalasia. BSE 08/22/2015 s/s aspiration present, hesitant but agreeable to nectar liquids, Dys 3 with chin tuck (decreased, not eliminated s/s aspiration during BSE). Admitted 12/20 due to acute decompensation with hypoxemia and O2 sats in 60s at home. CXR multi focal pneumonia, small hiatal hernia with patulous appearance esophagus (i.e widened area often associated with Barrett's Esophagus) containing oral content. Speech Pathology assessment recommended in  CT report. Pt able to have MBS in the newly revevated xray room for bariatric population.  Subjective: pt describes a long h/o dysphagia (oropharyngeal and esophageal in nature) Assessment / Plan / Recommendation CHL IP CLINICAL IMPRESSIONS 09/03/2015 Therapy Diagnosis Moderate pharyngeal phase dysphagia Clinical Impression Pt exhibited moderate motor based pharyngeal dysphagia due to decreased laryngeal closure resulting in silent aspiration of thin barium while utilizing a chin tuck posture. Intermittent mild penetration after the swallow from trace-mild vallecular residue removed with cues for volitional cough and x 1 during swallow due to large sip. Upper portion only of esophagus viewed due to inability to lower xray machine with width of chair with barium stasis in wide proximal esophagus. Recommend Dys 3 texture and nectar thick liquids with continuing chin tuck posture, volitional throat clear/cough after every several sips, alternate bites and sips, small sips and stay upright 1 hour after meals. Aspiration risk is chronic due to significant esophageal dysphagia (previously diagnosed) and pt's difficulty complying with safe swallow precautions primarily due to cognitive/memory deficits.     Impact on safety and function (No Data)   CHL IP TREATMENT RECOMMENDATION 09/03/2015 Treatment Recommendations Therapy as outlined in treatment plan below   Prognosis 09/03/2015 Prognosis for Safe Diet Advancement Fair Barriers to Reach Goals Cognitive deficits;Behavior Barriers/Prognosis Comment -- CHL IP DIET RECOMMENDATION 09/03/2015 SLP Diet Recommendations Dysphagia 3 (Mech soft) solids;Nectar thick liquid Liquid Administration via Cup;No straw Medication Administration Whole meds with puree Compensations Small sips/bites;Slow rate;Follow solids with liquid;Clear throat intermittently;Chin tuck Postural Changes Seated upright at 90 degrees;Remain semi-upright after after feeds/meals (Comment)   CHL IP OTHER  RECOMMENDATIONS 09/03/2015 Recommended Consults -- Oral Care Recommendations Oral care BID Other Recommendations --   CHL IP FOLLOW UP RECOMMENDATIONS 09/02/2015 Follow up Recommendations (No Data)   CHL IP FREQUENCY AND DURATION 09/03/2015 Speech Therapy Frequency (ACUTE ONLY) min 2x/week Treatment Duration 2 weeks      CHL IP ORAL PHASE 09/03/2015 Oral Phase WFL Oral - Pudding Teaspoon -- Oral - Pudding Cup -- Oral - Honey Teaspoon -- Oral - Honey Cup -- Oral - Nectar Teaspoon -- Oral - Nectar Cup -- Oral - Nectar Straw -- Oral - Thin Teaspoon -- Oral - Thin Cup -- Oral - Thin Straw -- Oral - Puree -- Oral - Mech Soft -- Oral - Regular -- Oral - Multi-Consistency -- Oral - Pill -- Oral Phase - Comment --  CHL IP PHARYNGEAL PHASE 09/03/2015 Pharyngeal Phase Impaired Pharyngeal- Pudding Teaspoon -- Pharyngeal -- Pharyngeal- Pudding Cup -- Pharyngeal -- Pharyngeal- Honey Teaspoon -- Pharyngeal -- Pharyngeal- Honey Cup -- Pharyngeal -- Pharyngeal- Nectar Teaspoon -- Pharyngeal -- Pharyngeal- Nectar Cup Penetration/Apiration after swallow;Pharyngeal residue - valleculae;Reduced tongue base retraction Pharyngeal Material enters airway, remains ABOVE vocal cords and not ejected out Pharyngeal- Nectar Straw -- Pharyngeal -- Pharyngeal- Thin Teaspoon -- Pharyngeal -- Pharyngeal- Thin Cup Penetration/Aspiration during swallow;Pharyngeal residue - valleculae;Reduced airway/laryngeal closure;Reduced tongue base retraction Pharyngeal Material enters airway, passes BELOW  cords without attempt by patient to eject out (silent aspiration) Pharyngeal- Thin Straw -- Pharyngeal -- Pharyngeal- Puree -- Pharyngeal -- Pharyngeal- Mechanical Soft -- Pharyngeal -- Pharyngeal- Regular Pharyngeal residue - valleculae;Reduced tongue base retraction Pharyngeal -- Pharyngeal- Multi-consistency -- Pharyngeal -- Pharyngeal- Pill Pharyngeal residue - valleculae;Reduced tongue base retraction Pharyngeal -- Pharyngeal Comment --  CHL IP CERVICAL  ESOPHAGEAL PHASE 09/03/2015 Cervical Esophageal Phase WFL Pudding Teaspoon -- Pudding Cup -- Honey Teaspoon -- Honey Cup -- Nectar Teaspoon -- Nectar Cup -- Nectar Straw -- Thin Teaspoon -- Thin Cup -- Thin Straw -- Puree -- Mechanical Soft -- Regular -- Multi-consistency -- Pill -- Cervical Esophageal Comment -- [place] Siren 09/03/2015, 1:47 PM Orbie Pyo Litaker M.Ed CCC-SLP Pager [phone]                STUDIES:  PSG 06/03/13 >> AHI 5, SpO2 low 82%, REM 16. 101 min with SpO2 < 88%. Needed 1 liter oxygen. ONO with RA 06/05/13 >> Test time 8 hrs 31 min. Mean SpO2 84%, low SpO2 59%. Spent 8 hrs 1 min with SpO2 < 88%. ONO with 2 liters 06/27/13 >> Test time 7 hrs 25 min. Basal SpO2 91.9%, low SpO2 76%. Spent 11 min with SpO2 < 88%. Echo 07/07/2015 >  Could not eval LVEF (prior study 55-60%), mod-severe AS (new from prior) CT chest  12/20 >>> multifocal PNA, small hiatal hernia with patulous appearing esophagus.  Echo  12/20 >>> EF [id number], Grade 2 diastolic dysfunction, dilated RA   CULTURES: Blood 12/20 >  Sputum 12/20 > Urine strep 12/20 > Urine legionella 12/20 > RVP 12/21>>>  ANTIBIOTICS: Cefepime 12/20 > Vancomycin 12/20 > Azithromycin 12/20 >  SIGNIFICANT EVENTS: 12/17 discharged on oral ABX for PNA 12/20 re-admit for PNA, Resp failure on BiPAP  LINES/TUBES:   DISCUSSION: 68 year old male admitted with acute respiratory failure due to recurrent PNA possibly r/t aspiration with a likely component of pulmonary edema. Requiring BiPAP at time of admission, mentation has improved well, but ABG is lagging. Treating with broad ABX and add azithro in case atypical infection. Diuresing gently. Close monitoring in ICU. Full Code.  ASSESSMENT / PLAN:  PULMONARY A: Acute on chronic respiratory failure with hypoxemia and hypercarbia Bilateral infiltrates likely PNA, concern for ongoing aspiration. Pulmonary edema likely contributing as well.  Pulmonary Hypertension,  secondary Respiratory acidosis > slow to improve on BiPAP but mentation improved OHS not on nocturnal NIMV P:   Supplemental O2 as needed  Aspiration precautions as below  Diurese per cardiology section ABX per ID section  CARDIOVASCULAR A:  Acute on chronic CHF -- EF [id number], grade 2 diastolic dysfunction on echo.  Tropinin elevation, mild H/o HTN H/o HLD P:  Hold further diuresis for now with high creatinine Resume home amlodipine, lopressor with HTN Resume home statin  PRN metoprolol IV  RENAL A:   Acute renal failure on CKD stage IV Hyperkalemia P:   Hold diuresis 12/23 Repeat BMP in AM Saline lock  Correct electrolytes as indicated.   GASTROINTESTINAL A:   GERD Chronic aspiration P:   NPO Barium swallow shows dysphagia. Follow aspiration precautions as per speech pathology. Protonix daily for SUP  HEMATOLOGIC A:   Anemia - hemoglobin at baseline P:  Heparin for VTE prophylaxis SCDs Trend CBC  INFECTIOUS A:   SIRS/sepsis secondary to PNA > concern for HCAP vs viral vs atypical  P:   ABX and cultures as above Trend PCT Has struggled with transition to PO abx  several times now, consider prolonged course IV antibiotics +/- further GI w/u  PCT  ENDOCRINE A:   Hypothyroid Recently on steroids as inpatient, finished taper 12/20  P:   Change synthroid back to PO   NEUROLOGIC A:   Acute metabolic encephalopathy  P:   RASS goal: 0 Resume home zoloft  Cont hold seroquel, neurontin   FAMILY  - Updates: Patient and wife updated at length 12/23.  - Inter-disciplinary family meet or Palliative Care meeting due by:  12/27  Marshell Garfinkel MD [facility] Pager [phone] If no answer or after 3pm call: [phone] 09/04/2015, 10:01 AM

## 2015-09-04 NOTE — Progress Notes (Signed)
Pharmacy Antibiotic Follow-up Note  Wesley [name] is a 69 y.o. year-old male admitted on 09/01/2015.  The patient is currently on day 3 of azithromycin 500 mg IV q24h, cefepime (current dosage 2g IV q24h) and vancomycin (current dosage 1750mg  IV q48h) for multilobar pneumonia thought likely due to chronic aspiration.  Dosages of cefepime and vancomycin were adjusted for renal insufficiency.  MRSA PCR screening was negative on 08/09/15 and 08/20/15.    According to PCCM, because patient has struggled with transition to PO antibiotics several times, consider prolonged course of IV antibiotics and possibly further GI workup.  Assessment/Plan: Continue current azithromycin, cefepime, and vancomycin dosages for now.  PCCM to provide update regarding anticipated duration of therapy and potential for de-escalation. If not able to de-escalate regimen by discontinuing vancomycin, check trough on 12/24. Follow SCr closely.  Temp (24hrs), Avg:98.6 F (37 C), Min:97.9 F (36.6 C), Max:99.1 F (37.3 C)   Recent Labs Lab 08/29/15 0828 09/01/15 2030 09/02/15 0640 09/03/15 0406 09/04/15 0345  WBC 13.6* 19.4* 13.3* 12.7* 10.7*    Recent Labs Lab 09/01/15 2030 09/02/15 0640 09/03/15 0406 09/04/15 0345  CREATININE 2.54* 2.77* 2.92* 2.69*   Estimated Creatinine Clearance: 35.5 mL/min (by C-G formula based on Cr of 2.69).    No Known Allergies  Antimicrobials this admission: 12/20 >>azithromycin >> 12/20 >>cefepime >> 12/20 >>vancomycin >>  Levels/dose changes this admission: 12/22 cefepime increased from 1g to 2g q24h due to patient weight  Microbiology results: 12/20 blood: NGTD 12/20 sputum: not collected 12/21 res virus panel: pending 12/21 Legionella Ag: pending 12/21 Strep Ag: pending MRSA screening cancelled at this readmission - previously negative x 2 (11/27, 12/8)    Thank you for allowing pharmacy to be a part of this patient's care.  Joycelyn Rua PharmD,  BCPS 09/04/2015 9:21 AM

## 2015-09-04 NOTE — Progress Notes (Signed)
NUTRITION NOTE  Visited pt and his wife at bedside. Pt's wife states that she was able to talk with SLP and Patient Services Manager yesterday (12/22) concerning questions related to recommended diet texture and thickened liquids. Pt and wife deny any additional questions or concerns at this time.  RD will continue to follow per protocol.    Jarome Matin, RD, LDN Inpatient Clinical Dietitian Pager # [phone] After hours/weekend pager # [phone]

## 2015-09-05 DIAGNOSIS — K224 Dyskinesia of esophagus: Secondary | ICD-10-CM

## 2015-09-05 DIAGNOSIS — J690 Pneumonitis due to inhalation of food and vomit: Secondary | ICD-10-CM

## 2015-09-05 LAB — VANCOMYCIN, TROUGH: Vancomycin Tr: 21 ug/mL — ABNORMAL HIGH (ref 10.0–20.0)

## 2015-09-05 LAB — MAGNESIUM: Magnesium: 1.9 mg/dL (ref 1.7–2.4)

## 2015-09-05 NOTE — Progress Notes (Addendum)
[facility] PROGRESS NOTE  Wesley [name] [id number] DOB: [birth year] DOA: 09/01/2015 PCP: [name], MD  Assessment/Plan: Active Problems:   Acute respiratory failure with hypoxia and hypercarbia ([place]) - Most likely due to aspiration pneumonia - continue supplemental oxygen - pulmonology following  Aspiration pna - continue current antibiotic therapy - speech therapy has evaluated patient and made recommendations. Please refer to their note. Patient aware of recommendations.  Uncontrolled hypertension - will add prn hydralazine - pt currently on amlodipine and metoprolol    Chronic kidney disease (CKD), stage IV (severe) (HCC) - Stable Serum creatinine steady around 2.6    Acute encephalopathy - Most likely due to hypoxia/hypercarbia and toxic encephalopathy secondary to active infection - continue antibiotic therapy - improving.  Code Status: full Family Communication: discussed with wife as well  Disposition Plan: Pending improvement in condition   Consultants:  None  Procedures:  Speech therapy  pccm  Antibiotics:  Azithromycin  Cefepime   Vancomycin  HPI/Subjective: Pt has no new complaints. No acute issues overnight.  Objective: Filed Vitals:   09/05/15 0435 09/05/15 1434  BP: 155/69 135/68  Pulse: 57 65  Temp: 98.1 F (36.7 C) 98.1 F (36.7 C)  Resp: 22 20    Intake/Output Summary (Last 24 hours) at 09/05/15 1707 Last data filed at 09/05/15 1434  Gross per 24 hour  Intake    780 ml  Output   1127 ml  Net   -347 ml   Filed Weights   09/02/15 0130  Weight: 135.8 kg (299 lb 6.2 oz)    Exam:   General:  Pt in nad, alert and awake  Cardiovascular: rrr, no mrg  Respiratory: equal chest rise, no wheezes  Abdomen: soft, Nd, NT  Musculoskeletal: no cyanosis or clubbing   Data Reviewed: Basic Metabolic Panel:  Recent Labs Lab 09/01/15 2030 09/02/15 0640 09/03/15 0406 09/04/15 0345 09/05/15 0647  NA  147* 145 149* 147*  --   K 5.2* 4.8 4.8 4.7  --   CL 101 97* 100* 96*  --   CO2 39* 37* 39* 40*  --   GLUCOSE 127* 164* 107* 104*  --   BUN 55* 53* 60* 57*  --   CREATININE 2.54* 2.77* 2.92* 2.69*  --   CALCIUM 8.7* 8.4* 8.7* 8.8*  --   MG  --  1.8  --   --  1.9  PHOS  --  4.8*  --   --   --    Liver Function Tests:  Recent Labs Lab 09/01/15 2030  AST 14*  ALT 20  ALKPHOS 73  BILITOT 0.4  PROT 6.2*  ALBUMIN 2.9*   No results for input(s): LIPASE, AMYLASE in the last 168 hours. No results for input(s): AMMONIA in the last 168 hours. CBC:  Recent Labs Lab 09/01/15 2030 09/02/15 0640 09/03/15 0406 09/04/15 0345  WBC 19.4* 13.3* 12.7* 10.7*  NEUTROABS 17.0*  --   --   --   HGB 8.6* 7.5* 7.6* 8.3*  HCT 30.5* 27.1* 26.0* 28.8*  MCV 89.7 89.7 86.4 86.7  PLT 175 163 164 173   Cardiac Enzymes:  Recent Labs Lab 09/01/15 2030 09/02/15 0100 09/02/15 0640  TROPONINI 0.06* 0.08* 0.09*   BNP (last 3 results)  Recent Labs  04/18/15 1554 08/09/15 0746 09/01/15 2030  BNP 496.3* 657.8* 562.3*    ProBNP (last 3 results) No results for input(s): PROBNP in the last 8760 hours.  CBG: No results for input(s): GLUCAP in the last  168 hours.  Recent Results (from the past 240 hour(s))  Culture, blood (routine x 2)     Status: None (Preliminary result)   Collection Time: 09/01/15  9:00 PM  Result Value Ref Range Status   Specimen Description BLOOD RIGHT ANTECUBITAL  Final   Special Requests BOTTLES DRAWN AEROBIC AND ANAEROBIC 5CC  Final   Culture   Final    NO GROWTH 4 DAYS Performed at [facility]    Report Status PENDING  Incomplete  Culture, blood (routine x 2)     Status: None (Preliminary result)   Collection Time: 09/01/15  9:20 PM  Result Value Ref Range Status   Specimen Description BLOOD BLOOD LEFT HAND  Final   Special Requests IN PEDIATRIC BOTTLE 3ML  Final   Culture   Final    NO GROWTH 4 DAYS Performed at [facility]    Report  Status PENDING  Incomplete  Respiratory virus panel     Status: None   Collection Time: 09/02/15  1:26 AM  Result Value Ref Range Status   Respiratory Syncytial Virus A Negative Negative Final   Respiratory Syncytial Virus B Negative Negative Final   Influenza A Negative Negative Final   Influenza B Negative Negative Final   Parainfluenza 1 Negative Negative Final   Parainfluenza 2 Negative Negative Final   Parainfluenza 3 Negative Negative Final   Metapneumovirus Negative Negative Final   Rhinovirus Negative Negative Final   Adenovirus Negative Negative Final    Comment: (NOTE) Performed At: [facility] [address] [place], [place] [id number] Lindon Romp MD [id number]   Culture, expectorated sputum-assessment     Status: None   Collection Time: 09/04/15  9:30 AM  Result Value Ref Range Status   Specimen Description SPUTUM  Final   Special Requests Normal  Final   Sputum evaluation   Final    THIS SPECIMEN IS ACCEPTABLE. RESPIRATORY CULTURE REPORT TO FOLLOW.   Report Status 09/04/2015 FINAL  Final     Studies: No results found.  Scheduled Meds: . amLODipine  10 mg Oral Daily  . antiseptic oral rinse  7 mL Mouth Rinse q12n4p  . aspirin EC  81 mg Oral q morning - 10a  . azithromycin  500 mg Intravenous Q24H  . ceFEPime (MAXIPIME) IV  2 g Intravenous Q24H  . chlorhexidine  15 mL Mouth Rinse BID  . heparin  5,000 Units Subcutaneous 3 times per day  . ipratropium-albuterol  3 mL Nebulization QID  . levothyroxine  125 mcg Oral QAC breakfast  . metoprolol tartrate  100 mg Oral BID  . pantoprazole  40 mg Oral Daily  . pravastatin  20 mg Oral q1800  . sertraline  150 mg Oral QHS  . sodium chloride  3 mL Intravenous Q12H  . vancomycin  1,750 mg Intravenous Q48H   Continuous Infusions: . albuterol Stopped (09/02/15 0038)    Time spent: > 35 minutes   Velvet Bathe  [facility] Pager [phone]. If 7PM-7AM, please contact night-coverage at  [email or web address], password [facility] 09/05/2015, 5:07 PM  LOS: 4 days

## 2015-09-05 NOTE — Progress Notes (Signed)
PULMONARY / CRITICAL CARE MEDICINE   Name: Wesley [name] MRN: [id number] DOB: July 28, 1946    ADMISSION DATE:  09/01/2015 CONSULTATION DATE:  09/01/2015  REFERRING MD:  EDP Pfeiffer  CHIEF COMPLAINT:  SOB  SUBJECTIVE:  Wife in room enthusiastic about speech therapy recommendations/ D3 diet. He says his cough is better.  VITAL SIGNS: BP 155/69 mmHg  Pulse 57  Temp(Src) 98.1 F (36.7 C) (Oral)  Resp 22  Ht 5\' 10"  (1.778 m)  Wt 135.8 kg (299 lb 6.2 oz)  BMI 42.96 kg/m2  SpO2 96%  HEMODYNAMICS:    VENTILATOR SETTINGS:    INTAKE / OUTPUT: I/O last 3 completed shifts: In: 1620 [P.O.:500; I.V.:20; IV Piggyback:1100] Out: [id number] [Urine:4125]  PHYSICAL EXAMINATION: General:  Obese male, lying in bed Neuro:  Awake, oriented. No distress HEENT:  Moist mucous membranes Cardiovascular:  RRR, no MRG. 2+ BLE edema Lungs:  Clear, no wheeze, crackles Abdomen:  Obese, soft, non-tender, non-distended Musculoskeletal:  No acute deformity or ROM limiation Skin:  Intact  LABS:  BMET  Recent Labs Lab 09/02/15 0640 09/03/15 0406 09/04/15 0345  NA 145 149* 147*  K 4.8 4.8 4.7  CL 97* 100* 96*  CO2 37* 39* 40*  BUN 53* 60* 57*  CREATININE 2.77* 2.92* 2.69*  GLUCOSE 164* 107* 104*    Electrolytes  Recent Labs Lab 09/02/15 0640 09/03/15 0406 09/04/15 0345 09/05/15 0647  CALCIUM 8.4* 8.7* 8.8*  --   MG 1.8  --   --  1.9  PHOS 4.8*  --   --   --     CBC  Recent Labs Lab 09/02/15 0640 09/03/15 0406 09/04/15 0345  WBC 13.3* 12.7* 10.7*  HGB 7.5* 7.6* 8.3*  HCT 27.1* 26.0* 28.8*  PLT 163 164 173    Coag's No results for input(s): APTT, INR in the last 168 hours.  Sepsis Markers  Recent Labs Lab 09/02/15 0640 09/03/15 0406 09/04/15 0345  PROCALCITON 0.48 0.37 0.35    ABG  Recent Labs Lab 09/01/15 2059 09/01/15 2137 09/02/15 0030  PHART 7.210* 7.225* 7.249*  PCO2ART 95.3* 94.5* 87.6*  PO2ART 75.1* 81.3 67.2*    Liver Enzymes  Recent  Labs Lab 09/01/15 2030  AST 14*  ALT 20  ALKPHOS 73  BILITOT 0.4  ALBUMIN 2.9*    Cardiac Enzymes  Recent Labs Lab 09/01/15 2030 09/02/15 0100 09/02/15 0640  TROPONINI 0.06* 0.08* 0.09*    Glucose No results for input(s): GLUCAP in the last 168 hours.  Imaging No results found.   STUDIES:  PSG 06/03/13 >> AHI 5, SpO2 low 82%, REM 16. 101 min with SpO2 < 88%. Needed 1 liter oxygen. ONO with RA 06/05/13 >> Test time 8 hrs 31 min. Mean SpO2 84%, low SpO2 59%. Spent 8 hrs 1 min with SpO2 < 88%. ONO with 2 liters 06/27/13 >> Test time 7 hrs 25 min. Basal SpO2 91.9%, low SpO2 76%. Spent 11 min with SpO2 < 88%. Echo 07/07/2015 >  Could not eval LVEF (prior study 55-60%), mod-severe AS (new from prior) CT chest  12/20 >>> multifocal PNA, small hiatal hernia with patulous appearing esophagus.  Echo  12/20 >>> EF 123456, Grade 2 diastolic dysfunction, dilated RA   CULTURES: Blood 12/20 >  Sputum 12/20 > Urine strep 12/20 > Urine legionella 12/20 > RVP 12/21>>>  ANTIBIOTICS: Cefepime 12/20 > Vancomycin 12/20 > Azithromycin 12/20 >  SIGNIFICANT EVENTS: 12/17 discharged on oral ABX for PNA 12/20 re-admit for PNA, Resp failure on BiPAP  LINES/TUBES:   DISCUSSION: 69 year old male admitted with acute respiratory failure due to recurrent PNA r/t aspiration with a likely component of pulmonary edema. Requiring BiPAP at time of admission, mentation has improved well, but ABG is lagging. Treating with broad ABX and add azithro in case atypical infection. Diuresing gently.  Full Code. Speech Therapy and Swallowing evals clearly indicate LPR and esophageal dysmotility making recurrent aspiration the primary cause for recurrent pneumonias. Wife understands this will be long term life-style issue.  ASSESSMENT / PLAN:  PULMONARY A: Acute on chronic respiratory failure with hypoxemia and hypercarbia Bilateral infiltrates likely PNA, concern for ongoing aspiration based on swallow  eval. Pulmonary edema likely contributing as well.  Pulmonary Hypertension, secondary Respiratory acidosis  Improved, mentation improved OHS not on nocturnal NIMV P:   Supplemental O2 as needed  Aspiration precautions as below  Diurese per cardiology section ABX per ID section Follow SLP diet/ D3 and reflux control measures  CARDIOVASCULAR A:  Acute on chronic CHF -- EF 123456, grade 2 diastolic dysfunction on echo.  Tropinin elevation, mild H/o HTN H/o HLD P:  Hold further diuresis for now with high creatinine Resume home amlodipine, lopressor with HTN Resume home statin  PRN metoprolol IV  RENAL A:   Acute renal failure on CKD stage IV Hyperkalemia P:   Hold diuresis 12/23 Repeat BMP in AM Saline lock  Correct electrolytes as indicated.   GASTROINTESTINAL A:   GERD Chronic aspiration P:   NPO Barium swallow shows dysphagia. Follow aspiration precautions as per speech pathology. Protonix daily for SUP  HEMATOLOGIC A:   Anemia - hemoglobin at baseline P:  Heparin for VTE prophylaxis SCDs Trend CBC  INFECTIOUS A:   SIRS/sepsis secondary to PNA > concern for HCAP vs viral vs atypical  P:   ABX and cultures as above Trend PCT Has struggled with transition to PO abx several times now, consider prolonged course IV antibiotics +/- further GI w/u  PCT  ENDOCRINE A:   Hypothyroid Recently on steroids as inpatient, finished taper 12/20  P:   Change synthroid back to PO   NEUROLOGIC A:   Acute metabolic encephalopathy  P:   RASS goal: 0 Resume home zoloft  Cont hold seroquel, neurontin  Emphasize upright and mobilization, PT/OT  FAMILY  - Updates: Patient and wife updated at length 12/23.  - Inter-disciplinary family meet or Palliative Care meeting due by:  12/27  CD Dawson [facility] Pager [phone] If no answer or after 3pm call: [phone] 09/05/2015, 10:11 AM

## 2015-09-06 LAB — CULTURE, RESPIRATORY W GRAM STAIN

## 2015-09-06 LAB — CULTURE, BLOOD (ROUTINE X 2)
Culture: NO GROWTH
Culture: NO GROWTH

## 2015-09-06 LAB — CULTURE, RESPIRATORY: CULTURE: NORMAL

## 2015-09-06 MED ORDER — LEVOFLOXACIN IN D5W 750 MG/150ML IV SOLN
750.0000 mg | INTRAVENOUS | Status: DC
  Administered 2015-09-06: 750 mg via INTRAVENOUS
  Filled 2015-09-06 (×2): qty 150

## 2015-09-06 NOTE — Progress Notes (Signed)
[facility] PROGRESS NOTE  Wesley [name] [id number] DOB: 02-26-1946 DOA: 09/01/2015 PCP: [name], MD  Assessment/Plan: Active Problems:   Acute respiratory failure with hypoxia and hypercarbia ([place]) - Most likely due to aspiration pneumonia - continue supplemental oxygen - pulmonology following  Aspiration pna - continue current antibiotic therapy - speech therapy has evaluated patient and made recommendations. Please refer to their note. Patient aware of recommendations.  Uncontrolled hypertension - will add prn hydralazine - pt currently on amlodipine and metoprolol    Chronic kidney disease (CKD), stage IV (severe) (HCC) - Stable Serum creatinine steady around 2.6    Acute encephalopathy - Most likely due to hypoxia/hypercarbia and toxic encephalopathy secondary to active infection - continue antibiotic therapy - improving.  Code Status: full Family Communication: discussed with wife as well  Disposition Plan: Pending improvement in condition   Consultants:  None  Procedures:  Speech therapy  pccm  Antibiotics:  Levaquin  HPI/Subjective: Pt has no new complaints. No acute issues overnight. Feeling better  Objective: Filed Vitals:   09/05/15 2326 09/06/15 0439  BP: 150/87 139/51  Pulse:  65  Temp:  97.7 F (36.5 C)  Resp:  20    Intake/Output Summary (Last 24 hours) at 09/06/15 1354 Last data filed at 09/06/15 0730  Gross per 24 hour  Intake    800 ml  Output    675 ml  Net    125 ml   Filed Weights   09/02/15 0130 09/06/15 0439  Weight: 135.8 kg (299 lb 6.2 oz) 129.2 kg (284 lb 13.4 oz)    Exam:   General:  Pt in nad, alert and awake  Cardiovascular: rrr, no mrg  Respiratory: equal chest rise, no wheezes  Abdomen: soft, Nd, NT  Musculoskeletal: no cyanosis or clubbing   Data Reviewed: Basic Metabolic Panel:  Recent Labs Lab 09/01/15 2030 09/02/15 0640 09/03/15 0406 09/04/15 0345 09/05/15 0647  NA  147* 145 149* 147*  --   K 5.2* 4.8 4.8 4.7  --   CL 101 97* 100* 96*  --   CO2 39* 37* 39* 40*  --   GLUCOSE 127* 164* 107* 104*  --   BUN 55* 53* 60* 57*  --   CREATININE 2.54* 2.77* 2.92* 2.69*  --   CALCIUM 8.7* 8.4* 8.7* 8.8*  --   MG  --  1.8  --   --  1.9  PHOS  --  4.8*  --   --   --    Liver Function Tests:  Recent Labs Lab 09/01/15 2030  AST 14*  ALT 20  ALKPHOS 73  BILITOT 0.4  PROT 6.2*  ALBUMIN 2.9*   No results for input(s): LIPASE, AMYLASE in the last 168 hours. No results for input(s): AMMONIA in the last 168 hours. CBC:  Recent Labs Lab 09/01/15 2030 09/02/15 0640 09/03/15 0406 09/04/15 0345  WBC 19.4* 13.3* 12.7* 10.7*  NEUTROABS 17.0*  --   --   --   HGB 8.6* 7.5* 7.6* 8.3*  HCT 30.5* 27.1* 26.0* 28.8*  MCV 89.7 89.7 86.4 86.7  PLT 175 163 164 173   Cardiac Enzymes:  Recent Labs Lab 09/01/15 2030 09/02/15 0100 09/02/15 0640  TROPONINI 0.06* 0.08* 0.09*   BNP (last 3 results)  Recent Labs  04/18/15 1554 08/09/15 0746 09/01/15 2030  BNP 496.3* 657.8* 562.3*    ProBNP (last 3 results) No results for input(s): PROBNP in the last 8760 hours.  CBG: No results for input(s):  GLUCAP in the last 168 hours.  Recent Results (from the past 240 hour(s))  Culture, blood (routine x 2)     Status: None (Preliminary result)   Collection Time: 09/01/15  9:00 PM  Result Value Ref Range Status   Specimen Description BLOOD RIGHT ANTECUBITAL  Final   Special Requests BOTTLES DRAWN AEROBIC AND ANAEROBIC 5CC  Final   Culture   Final    NO GROWTH 4 DAYS Performed at [facility]    Report Status PENDING  Incomplete  Culture, blood (routine x 2)     Status: None (Preliminary result)   Collection Time: 09/01/15  9:20 PM  Result Value Ref Range Status   Specimen Description BLOOD BLOOD LEFT HAND  Final   Special Requests IN PEDIATRIC BOTTLE 3ML  Final   Culture   Final    NO GROWTH 4 DAYS Performed at [facility]    Report  Status PENDING  Incomplete  Respiratory virus panel     Status: None   Collection Time: 09/02/15  1:26 AM  Result Value Ref Range Status   Respiratory Syncytial Virus A Negative Negative Final   Respiratory Syncytial Virus B Negative Negative Final   Influenza A Negative Negative Final   Influenza B Negative Negative Final   Parainfluenza 1 Negative Negative Final   Parainfluenza 2 Negative Negative Final   Parainfluenza 3 Negative Negative Final   Metapneumovirus Negative Negative Final   Rhinovirus Negative Negative Final   Adenovirus Negative Negative Final    Comment: (NOTE) Performed At: [facility] [address] [place], [place] [id number] Lindon Romp MD [id number]   Culture, expectorated sputum-assessment     Status: None   Collection Time: 09/04/15  9:30 AM  Result Value Ref Range Status   Specimen Description SPUTUM  Final   Special Requests Normal  Final   Sputum evaluation   Final    THIS SPECIMEN IS ACCEPTABLE. RESPIRATORY CULTURE REPORT TO FOLLOW.   Report Status 09/04/2015 FINAL  Final  Culture, respiratory (NON-Expectorated)     Status: None   Collection Time: 09/04/15  9:30 AM  Result Value Ref Range Status   Specimen Description SPUTUM  Final   Special Requests NONE  Final   Gram Stain   Final    FEW WBC PRESENT, PREDOMINANTLY PMN FEW SQUAMOUS EPITHELIAL CELLS PRESENT FEW GRAM POSITIVE COCCI IN PAIRS Performed at [facility]    Culture   Final    NORMAL OROPHARYNGEAL FLORA Performed at [facility]    Report Status 09/06/2015 FINAL  Final     Studies: No results found.  Scheduled Meds: . amLODipine  10 mg Oral Daily  . antiseptic oral rinse  7 mL Mouth Rinse q12n4p  . aspirin EC  81 mg Oral q morning - 10a  . azithromycin  500 mg Intravenous Q24H  . ceFEPime (MAXIPIME) IV  2 g Intravenous Q24H  . chlorhexidine  15 mL Mouth Rinse BID  . heparin  5,000 Units Subcutaneous 3 times per day  . ipratropium-albuterol   3 mL Nebulization QID  . levothyroxine  125 mcg Oral QAC breakfast  . metoprolol tartrate  100 mg Oral BID  . pantoprazole  40 mg Oral Daily  . pravastatin  20 mg Oral q1800  . sertraline  150 mg Oral QHS  . sodium chloride  3 mL Intravenous Q12H  . vancomycin  1,750 mg Intravenous Q48H   Continuous Infusions: . albuterol Stopped (09/02/15 0038)  Time spent: > 35 minutes   Velvet Bathe  [facility] Pager [phone]. If 7PM-7AM, please contact night-coverage at [email or web address], password [facility] 09/06/2015, 1:54 PM  LOS: 5 days

## 2015-09-06 NOTE — Discharge Summary (Signed)
Physician Discharge Summary  Wesley [name] [id number] DOB: 05/10/1946 DOA: 08/20/2015  PCP: [name], MD  Admit date: 08/20/2015 Discharge date: 08/29/2015  Time spent: 45 minutes  Recommendations for Outpatient Follow-up:  1. Tammy Parrett NP [facility] or Dr.Sood in 1-2weeks 2. Advise Compliance with dysphagia 3 diet with nectar thick liquids 3. 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 On the day of admission, pt noted to have increasing sob and hypoxia with increased O2 requirements (up to 11L NRB) and temp of 100.2F. Pt subsequently presented to ED where CXR was notable for B diffuse airspace disease sparing the RUL that has progressed since prior xray. Patient was started on empiric vanc and zosyn and hospitalist consulted for consideration for admission.   Hospital Course:  1. Acute respiratory failure with hypoxia secondary to aspiration pneumonia -on admission, initially required [facility] in the setting of recurrent aspiration PNA  -uses 3LNC at baseline -improved, see below  2. 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 -I suspect he will be a lifelong aspiration risk  -Underwent SLP eval, started on dysphagia 3 diet with nectar thick liquids with some improvement in cough after meals. -He was initially on Vanc and cefepime, his antibiotics were changed to Augmentin 5 days ago and he clinically deteriorated significantly after this, difficult to say if it was related to antibiotic change versus another aspiration event. -started back on IV vancomycin and Zosyn 12/12,repeat CXr with some improvement, no wheezing, changed steroids to PO 12/15 and Abx changed to oral cefpodoxime and flagyl 12/15, discussed with PharmD -continue compensatory measures for swallowing -home today, WBC improving and continues to have slow improvement but remains at high risk of decompensation due to underlying COPD, obesity, Dysphagia. -advised quick FU with [facility], unable to make appt due to weekend, wife will call on Monday for FU  3. HTN -stable  4. CKD stage 4 -stable  5. Hx Dysphagia -see discussion above -increased protonix to BID   Discharge Exam: Filed Vitals:   08/28/15 2025 08/29/15 0546  BP: 122/56 138/64  Pulse: 60 53  Temp: 97.8 F (36.6 C) 97.4 F (36.3 C)  Resp: 20 18    General: AAOx3 Cardiovascular: S1S2/RRR Respiratory: ronchi at both bases, improved  Discharge Instructions   Discharge Instructions    Discharge instructions    Complete by:  As directed   Dysphagia 3 diet with thickened liquids, low salt please     Increase activity  slowly    Complete by:  As directed           Discharge Medication List as of 08/29/2015 12:30 PM    START taking these medications   Details  cefpodoxime (VANTIN) 200 MG tablet Take 1 tablet (200 mg total) by mouth every 12 (twelve) hours.  For 3days, Starting 08/29/2015, Until Discontinued, Normal    Maltodextrin-Xanthan Gum ([place]) POWD Please dispense 1 box ( 1 month supply), Normal    metroNIDAZOLE (FLAGYL) 500 MG tablet Take 1 tablet (500 mg total) by mouth every 8 (eight) hours. For 3days, Starting 08/29/2015, Until Discontinued, Normal    pantoprazole (PROTONIX) 40 MG tablet Take 1 tablet (40 mg total) by mouth 2 (two) times daily., Starting 08/29/2015, Until Discontinued, Normal    predniSONE (DELTASONE) 20 MG tablet Take 40mg  for 1day then 20mg  for 2days then 10mg  for 2days then STOP, Normal      CONTINUE these medications which have NOT CHANGED   Details  albuterol (PROVENTIL HFA;VENTOLIN HFA) 108 (90 BASE) MCG/ACT inhaler Inhale 2 puffs into the lungs every 6 (six) hours as needed for wheezing or shortness of breath., Starting 08/13/2015, Until Discontinued, Print    albuterol (PROVENTIL) (2.5 MG/3ML) 0.083% nebulizer solution Take 2.5 mg by nebulization every 6 (six) hours as needed for wheezing or shortness of breath. , Starting 07/18/2014, Until Discontinued, Historical Med    amLODipine (NORVASC) 10 MG tablet Take 10 mg by mouth every morning. , Until Discontinued, Historical Med    aspirin EC 81 MG tablet Take 81 mg by mouth every morning., Until Discontinued, Historical Med    carboxymethylcellulose (REFRESH) 1 % ophthalmic solution Apply 1 drop to eye 3 (three) times daily., Until Discontinued, Historical Med    cholecalciferol (VITAMIN D) 1000 UNITS tablet Take 1,000 Units by mouth daily. , Until Discontinued, Historical Med    docusate sodium (COLACE) 100 MG capsule Take 100 mg by mouth daily., Until Discontinued, Historical Med    gabapentin (NEURONTIN) 300 MG capsule Take 300 mg by mouth at bedtime. , Until Discontinued, Historical Med    galantamine (RAZADYNE ER) 16 MG 24 hr capsule Take 16 mg by mouth at bedtime. , Until Discontinued, Historical Med    levothyroxine (SYNTHROID,  LEVOTHROID) 125 MCG tablet Take 125 mcg by mouth daily before breakfast., Until Discontinued, Historical Med    metoprolol (LOPRESSOR) 100 MG tablet Take 100 mg by mouth 2 (two) times daily., Until Discontinued, Historical Med    pravastatin (PRAVACHOL) 20 MG tablet Take 20 mg by mouth at bedtime., Until Discontinued, Historical Med    !! QUEtiapine (SEROQUEL) 100 MG tablet Take 100 mg by mouth at bedtime. Take with 25 mg tablet to equal a total dose of 125 mg, Until Discontinued, Historical Med    !! QUEtiapine (SEROQUEL) 25 MG tablet Take 25 mg by mouth at bedtime. Take with 100mg  tablet to equal a total dose of 125 mg, Until Discontinued, Historical Med    sertraline (ZOLOFT) 100 MG tablet Take 150 mg by mouth at bedtime., Until Discontinued, Historical Med    Tamsulosin HCl (FLOMAX) 0.4 MG CAPS Take 0.4 mg by mouth daily after breakfast. Once a day, Starting 11/14/2010, Until Discontinued, Historical Med     !! - Potential duplicate medications found. Please discuss with provider.    STOP taking these medications     omeprazole (PRILOSEC) 20 MG capsule        No Known Allergies Follow-up Information    Follow up  with [name], MD On 09/04/2015.   Specialty:  Family Medicine   Contact information:   [place] [id number] [phone]       Follow up with Tammy Parrett In 10 days.       The results of significant diagnostics from this hospitalization (including imaging, microbiology, ancillary and laboratory) are listed below for reference.    Significant Diagnostic Studies: Dg Chest 2 View  08/27/2015  CLINICAL DATA:  Cough. Aspiration pneumonia. Acute respiratory failure with hypoxia. Chronic kidney disease stage 4. Oropharyngeal Carcinoma. EXAM: CHEST  2 VIEW COMPARISON:  08/25/2015 FINDINGS: There has been interval improvement in diffuse bilateral airspace disease since previous study. Probable small left subpulmonic pleural effusion again  noted. No pneumothorax visualized. Heart size is stable. IMPRESSION: Interval improvement in diffuse bilateral airspace disease since previous study. Electronically Signed   By: Earle Gell M.D.   On: 08/27/2015 08:55   Dg Chest 2 View  08/20/2015  CLINICAL DATA:  69 year old male with shortness of breath. Subsequent encounter. EXAM: CHEST  2 VIEW COMPARISON:  Several prior chest x-rays most recent 08/09/2015. FINDINGS: Bilateral diffuse airspace disease sparing the right upper lobe has progressed when compared to prior examination (most notable change left upper lobe). This may represent multi focal pneumonia but will need to be followed until complete clearance to exclude underlying malignancy. Limited for detecting pulmonary edema given the diffuse airspace disease. Cardiomegaly. No pneumothorax. IMPRESSION: Bilateral diffuse airspace disease sparing the right upper lobe has progressed when compared to prior examination (most notable change left upper lobe). This may represent multi focal pneumonia but will need to be followed until complete clearance to exclude underlying malignancy. Limited for detecting pulmonary edema given the diffuse airspace disease. Cardiomegaly. Electronically Signed   By: Genia Del M.D.   On: 08/20/2015 12:46   Dg Chest 2 View  08/09/2015  CLINICAL DATA:  Hypoxia EXAM: CHEST  2 VIEW COMPARISON:  06/08/2015 FINDINGS: Cardiac silhouette is enlarged. There is patchy airspace disease in the RIGHT lower lobe not improved. LEFT lower lobe airspace disease is slightly worsened. No pneumothorax. IMPRESSION: Bibasilar airspace disease is concerning for multifocal pneumonia versus pulmonary edema. Mild worsening at the LEFT lung base. Electronically Signed   By: Suzy Bouchard M.D.   On: 08/09/2015 07:57   Ct Chest Wo Contrast  09/01/2015  CLINICAL DATA:  44-year-old male with respiratory favor. Patient was admitted to the hospital for pneumonia recently. EXAM: CT CHEST WITHOUT  CONTRAST TECHNIQUE: Multidetector CT imaging of the chest was performed following the standard protocol without IV contrast. COMPARISON:  Chest radiograph dated 09/01/2015 FINDINGS: Evaluation of this exam is limited in the absence of intravenous contrast. There is near complete consolidation of the left lower lobe with air bronchogram. Patchy areas of airspace opacity and consolidation no the bilaterally most compatible with multifocal pneumonia. Clinical correlation and follow-up resolution recommended. There are small bilateral pleural effusions, right greater than left no pneumothorax. The central airways are patent. Atherosclerotic calcification of the thoracic aorta. There is cardiomegaly. No pericardial effusion. There is coronary vascular calcification. There is top-normal mediastinal lymph nodes. Bilateral hilar adenopathy may be present. Evaluation of the hila and lymph nodes is limited in the absence of intravenous contrast. There is a small hiatal hernia with patulous appearance esophagus containing oral content. There is no axillary adenopathy. The chest wall soft tissues appear unremarkable. There is degenerative changes of the spine. Old healed left posterior rib fracture. No acute fracture identified.  Diverticulosis of the splenic flexure. The visualized upper abdomen is otherwise grossly unremarkable. IMPRESSION: Multi focal pneumonia. Clinical correlation and follow-up resolution recommended. Small hiatal hernia with patulous appearing esophagus. Aspiration pneumonia is not excluded. Further evaluation with speech pathology recommended. Electronically Signed   By: Anner Crete M.D.   On: 09/01/2015 23:53   Dg Chest Port 1 View  09/03/2015  CLINICAL DATA:  Pneumonia. EXAM: PORTABLE CHEST 1 VIEW COMPARISON:  Chest CT and radiographs 09/01/2015 FINDINGS: Cardiac silhouette is partially obscured but again appears enlarged. Multifocal airspace consolidation is again seen bilaterally, greatest in  the bases. Right basilar consolidation has mildly improved from the prior radiographs. No large pleural effusion or pneumothorax is identified. IMPRESSION: Bilateral lung opacities compatible with pneumonia. Mild improvement of right base aeration. Electronically Signed   By: Logan Bores M.D.   On: 09/03/2015 07:10   Dg Chest Portable 1 View  09/01/2015  CLINICAL DATA:  Shortness of Breath EXAM: PORTABLE CHEST 1 VIEW COMPARISON:  August 27, 2015 FINDINGS: There is increase in consolidation in the right base. Patchy infiltrate remains in both upper lobes and left base region without change. Heart is enlarged with pulmonary vascularity within normal limits. No adenopathy appreciable. IMPRESSION: Airspace opacity bilaterally, increased in the right base and stable elsewhere. Stable cardiomegaly. Suspect multifocal pneumonia, although a degree of underlying congestive heart failure may be present. Both congestive heart failure and pneumonia may exist concurrently. Electronically Signed   By: Lowella Grip III M.D.   On: 09/01/2015 19:27   Dg Chest Port 1 View  08/25/2015  CLINICAL DATA:  Follow-up aspiration pneumonia. Cough, congestion, shortness of Breath EXAM: PORTABLE CHEST 1 VIEW COMPARISON:  08/20/2015 FINDINGS: Severe patchy bilateral airspace disease again noted, increasing throughout the right lung, stable on the left. Possible small effusions. There is cardiomegaly. No acute bony abnormality. IMPRESSION: Severe patchy bilateral airspace disease again noted, worsening on the right since prior study. Suspect small effusions. Electronically Signed   By: Rolm Baptise M.D.   On: 08/25/2015 08:58   Dg Swallowing Func-speech Pathology  09/04/2015  Wesley [name], Wesley [name]     09/04/2015  9:21 AM Objective Swallowing Evaluation: MBS-Modified Barium Swallow Study Patient Details Name: Wesley [name] MRN: [id number] Date of Birth: 1946/07/02 Today's Date: 09/03/2015 Time: SLP Start Time (ACUTE ONLY):  1230-SLP Stop Time (ACUTE ONLY): 1250 SLP Time Calculation (min) (ACUTE ONLY): 20 min Past Medical History: Past Medical History Diagnosis Date . Chronic kidney disease    Stage IV . 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 HTN (hypertension)  . Aortic stenosis    mod-severe . Morbid obesity ([place])  . Pulmonary hypertension ([place])    secondary . Obesity hypoventilation syndrome (HCC)    Intolerant of NIMV . Achalasia  . PTSD (post-traumatic stress disorder)  . Hypothyroidism  . TBI (traumatic brain injury) ([place])  . 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 Knee arthroscopy  11/08/2002   right knee examination . Knee arthroscopy w/ partial medial meniscectomy  11/08/2002 . Chondroplasty  11/08/2002   extensive, of the patellofemoral joint . Chondroplasty  11/08/2002   lesser extent chondroplasty medial femoral condyle HPI: 69 year old male with PMH:  OHS on home O2, TBI, HTN,, Pulmonary HTN, dysphagia, oropharyngeal cancer, achalasia. BSE 08/22/2015 s/s aspiration present, hesitant but agreeable to nectar liquids, Dys 3 with chin tuck (decreased, not eliminated s/s aspiration during BSE). Admitted 12/20 due to acute decompensation with hypoxemia  and O2 sats in 60s at home. CXR multi focal pneumonia, small hiatal hernia with patulous appearance esophagus (i.e widened area often associated with Barrett's Esophagus) containing oral content. Speech Pathology assessment recommended in CT report. Pt able to have MBS in the newly revevated xray room for bariatric population.  Subjective: pt describes a long h/o dysphagia (oropharyngeal and esophageal in nature) Assessment / Plan / Recommendation CHL IP CLINICAL IMPRESSIONS 09/03/2015 Therapy Diagnosis Moderate pharyngeal phase dysphagia Clinical Impression Pt exhibited moderate motor based pharyngeal dysphagia due to decreased laryngeal closure resulting in silent aspiration of thin barium while utilizing a chin tuck  posture. Intermittent mild penetration after the swallow from trace-mild vallecular residue removed with cues for volitional cough and x 1 during swallow due to large sip. Upper portion only of esophagus viewed due to inability to lower xray machine with width of chair with barium stasis in wide proximal esophagus. Recommend Dys 3 texture and nectar thick liquids with continuing chin tuck posture, volitional throat clear/cough after every several sips, alternate bites and sips, small sips and stay upright 1 hour after meals. Aspiration risk is chronic due to significant esophageal dysphagia (previously diagnosed) and pt's difficulty complying with safe swallow precautions primarily due to cognitive/memory deficits.     Impact on safety and function (No Data)   CHL IP TREATMENT RECOMMENDATION 09/03/2015 Treatment Recommendations Therapy as outlined in treatment plan below   Prognosis 09/03/2015 Prognosis for Safe Diet Advancement Fair Barriers to Reach Goals Cognitive deficits;Behavior Barriers/Prognosis Comment -- CHL IP DIET RECOMMENDATION 09/03/2015 SLP Diet Recommendations Dysphagia 3 (Mech soft) solids;Nectar thick liquid Liquid Administration via Cup;No straw Medication Administration Whole meds with puree Compensations Small sips/bites;Slow rate;Follow solids with liquid;Clear throat intermittently;Chin tuck Postural Changes Seated upright at 90 degrees;Remain semi-upright after after feeds/meals (Comment)   CHL IP OTHER RECOMMENDATIONS 09/03/2015 Recommended Consults -- Oral Care Recommendations Oral care BID Other Recommendations --   CHL IP FOLLOW UP RECOMMENDATIONS 09/02/2015 Follow up Recommendations (No Data)   CHL IP FREQUENCY AND DURATION 09/03/2015 Speech Therapy Frequency (ACUTE ONLY) min 2x/week Treatment Duration 2 weeks      CHL IP ORAL PHASE 09/03/2015 Oral Phase WFL Oral - Pudding Teaspoon -- Oral - Pudding Cup -- Oral - Honey Teaspoon -- Oral - Honey Cup -- Oral - Nectar Teaspoon -- Oral -  Nectar Cup -- Oral - Nectar Straw -- Oral - Thin Teaspoon -- Oral - Thin Cup -- Oral - Thin Straw -- Oral - Puree -- Oral - Mech Soft -- Oral - Regular -- Oral - Multi-Consistency -- Oral - Pill -- Oral Phase - Comment --  CHL IP PHARYNGEAL PHASE 09/03/2015 Pharyngeal Phase Impaired Pharyngeal- Pudding Teaspoon -- Pharyngeal -- Pharyngeal- Pudding Cup -- Pharyngeal -- Pharyngeal- Honey Teaspoon -- Pharyngeal -- Pharyngeal- Honey Cup -- Pharyngeal -- Pharyngeal- Nectar Teaspoon -- Pharyngeal -- Pharyngeal- Nectar Cup Penetration/Apiration after swallow;Pharyngeal residue - valleculae;Reduced tongue base retraction Pharyngeal Material enters airway, remains ABOVE vocal cords and not ejected out Pharyngeal- Nectar Straw -- Pharyngeal -- Pharyngeal- Thin Teaspoon -- Pharyngeal -- Pharyngeal- Thin Cup Penetration/Aspiration during swallow;Pharyngeal residue - valleculae;Reduced airway/laryngeal closure;Reduced tongue base retraction Pharyngeal Material enters airway, passes BELOW cords without attempt by patient to eject out (silent aspiration) Pharyngeal- Thin Straw -- Pharyngeal -- Pharyngeal- Puree -- Pharyngeal -- Pharyngeal- Mechanical Soft -- Pharyngeal -- Pharyngeal- Regular Pharyngeal residue - valleculae;Reduced tongue base retraction Pharyngeal -- Pharyngeal- Multi-consistency -- Pharyngeal -- Pharyngeal- Pill Pharyngeal residue - valleculae;Reduced tongue base retraction Pharyngeal -- Pharyngeal Comment --  CHL IP CERVICAL ESOPHAGEAL PHASE 09/03/2015 Cervical Esophageal Phase WFL Pudding Teaspoon -- Pudding Cup -- Honey Teaspoon -- Honey Cup -- Nectar Teaspoon -- Nectar Cup -- Nectar Straw -- Thin Teaspoon -- Thin Cup -- Thin Straw -- Puree -- Mechanical Soft -- Regular -- Multi-consistency -- Pill -- Cervical Esophageal Comment -- Wesley [name] 09/03/2015, 1:47 PM Wesley [name] Wesley [name].[employer] [phone]               Microbiology: Recent Results (from the past 240 hour(s))  Culture, blood  (routine x 2)     Status: None   Collection Time: 09/01/15  9:00 PM  Result Value Ref Range Status   Specimen Description BLOOD RIGHT ANTECUBITAL  Final   Special Requests BOTTLES DRAWN AEROBIC AND ANAEROBIC 5CC  Final   Culture   Final    NO GROWTH 5 DAYS Performed at [facility]    Report Status 09/06/2015 FINAL  Final  Culture, blood (routine x 2)     Status: None   Collection Time: 09/01/15  9:20 PM  Result Value Ref Range Status   Specimen Description BLOOD BLOOD LEFT HAND  Final   Special Requests IN PEDIATRIC BOTTLE 3ML  Final   Culture   Final    NO GROWTH 5 DAYS Performed at [facility]    Report Status 09/06/2015 FINAL  Final  Respiratory virus panel     Status: None   Collection Time: 09/02/15  1:26 AM  Result Value Ref Range Status   Respiratory Syncytial Virus A Negative Negative Final   Respiratory Syncytial Virus B Negative Negative Final   Influenza A Negative Negative Final   Influenza B Negative Negative Final   Parainfluenza 1 Negative Negative Final   Parainfluenza 2 Negative Negative Final   Parainfluenza 3 Negative Negative Final   Metapneumovirus Negative Negative Final   Rhinovirus Negative Negative Final   Adenovirus Negative Negative Final    Comment: (NOTE) Performed At: [facility] [address] [place], [place] [id number] Lindon Romp MD [id number]   Culture, expectorated sputum-assessment     Status: None   Collection Time: 09/04/15  9:30 AM  Result Value Ref Range Status   Specimen Description SPUTUM  Final   Special Requests Normal  Final   Sputum evaluation   Final    THIS SPECIMEN IS ACCEPTABLE. RESPIRATORY CULTURE REPORT TO FOLLOW.   Report Status 09/04/2015 FINAL  Final  Culture, respiratory (NON-Expectorated)     Status: None   Collection Time: 09/04/15  9:30 AM  Result Value Ref Range Status   Specimen Description SPUTUM  Final   Special Requests NONE  Final   Gram Stain   Final    FEW WBC  PRESENT, PREDOMINANTLY PMN FEW SQUAMOUS EPITHELIAL CELLS PRESENT FEW GRAM POSITIVE COCCI IN PAIRS Performed at [facility]    Culture   Final    NORMAL OROPHARYNGEAL FLORA Performed at [facility]    Report Status 09/06/2015 FINAL  Final     Labs: Basic Metabolic Panel:  Recent Labs Lab 09/01/15 2030 09/02/15 0640 09/03/15 0406 09/04/15 0345 09/05/15 0647  NA 147* 145 149* 147*  --   K 5.2* 4.8 4.8 4.7  --   CL 101 97* 100* 96*  --   CO2 39* 37* 39* 40*  --   GLUCOSE 127* 164* 107* 104*  --   BUN 55* 53* 60* 57*  --   CREATININE 2.54* 2.77* 2.92* 2.69*  --  CALCIUM 8.7* 8.4* 8.7* 8.8*  --   MG  --  1.8  --   --  1.9  PHOS  --  4.8*  --   --   --    Liver Function Tests:  Recent Labs Lab 09/01/15 2030  AST 14*  ALT 20  ALKPHOS 73  BILITOT 0.4  PROT 6.2*  ALBUMIN 2.9*   No results for input(s): LIPASE, AMYLASE in the last 168 hours. No results for input(s): AMMONIA in the last 168 hours. CBC:  Recent Labs Lab 09/01/15 2030 09/02/15 0640 09/03/15 0406 09/04/15 0345  WBC 19.4* 13.3* 12.7* 10.7*  NEUTROABS 17.0*  --   --   --   HGB 8.6* 7.5* 7.6* 8.3*  HCT 30.5* 27.1* 26.0* 28.8*  MCV 89.7 89.7 86.4 86.7  PLT 175 163 164 173   Cardiac Enzymes:  Recent Labs Lab 09/01/15 2030 09/02/15 0100 09/02/15 0640  TROPONINI 0.06* 0.08* 0.09*   BNP: BNP (last 3 results)  Recent Labs  04/18/15 1554 08/09/15 0746 09/01/15 2030  BNP 496.3* 657.8* 562.3*    ProBNP (last 3 results) No results for input(s): PROBNP in the last 8760 hours.  CBG: No results for input(s): GLUCAP in the last 168 hours.     SignedDomenic Polite MD  FACP  [facility] 09/06/2015, 5:32 PM

## 2015-09-06 NOTE — Progress Notes (Signed)
Pharmacy Antibiotic Follow-up Note  Wesley [name] is a 69 y.o. year-old male recently hospitalized for PNA, discharged on oral abx, readmitted on 09/01/2015 for persistent PNA and respiratory failure on BiPAP.  The patient is currently on day 6 of antibiotics for multilobar pneumonia.  Antibiotics narrowed 12/25 to levaquin.    Assessment/Plan: Note CKD-IV. Levaquin 750mg  IV q48h due to CrCl between 30-50 ml/min.  F/u renal fxn closely to adjust.  CCM notes to consider prolonged course of IV antibotics +/- further GI w/u.   Temp (24hrs), Avg:98 F (36.7 C), Min:97.7 F (36.5 C), Max:98.2 F (36.8 C)   Recent Labs Lab 09/01/15 2030 09/02/15 0640 09/03/15 0406 09/04/15 0345  WBC 19.4* 13.3* 12.7* 10.7*     Recent Labs Lab 09/01/15 2030 09/02/15 0640 09/03/15 0406 09/04/15 0345  CREATININE 2.54* 2.77* 2.92* 2.69*   Estimated Creatinine Clearance: 34.5 mL/min (by C-G formula based on Cr of 2.69).    No Known Allergies  Antimicrobials this admission: 12/20 >>azithromycin >> 12/25 12/20 >>cefepime >>12/25 12/20 >>vancomycin >>12/25  Levels/dose changes this admission: 12/24 2000 == 21  Thank you for allowing pharmacy to be a part of this patient's care.  Ralene Bathe, PharmD, BCPS 09/06/2015, 2:19 PM  Pager: [phone]

## 2015-09-06 NOTE — Progress Notes (Signed)
Pharmacy Antibiotic Follow-up Note  Wesley [name] is a 69 y.o. year-old male admitted on 09/01/2015.  The patient is currently on day 4 of vancomycin for multilobar pneumonia.  Assessment/Plan: This patient's current antibiotics will be continued without adjustments.  Will recheck level prior to next vancomycin dose 12/26 @2300   Temp (24hrs), Avg:98 F (36.7 C), Min:97.7 F (36.5 C), Max:98.2 F (36.8 C)   Recent Labs Lab 09/01/15 2030 09/02/15 0640 09/03/15 0406 09/04/15 0345  WBC 19.4* 13.3* 12.7* 10.7*    Recent Labs Lab 09/01/15 2030 09/02/15 0640 09/03/15 0406 09/04/15 0345  CREATININE 2.54* 2.77* 2.92* 2.69*   Estimated Creatinine Clearance: 34.5 mL/min (by C-G formula based on Cr of 2.69).    No Known Allergies  Antimicrobials this admission: 12/20 >>azithromycin >> 12/20 >>cefepime >> 12/20 >>vancomycin >>  Levels/dose changes this admission: 12/24 2000 == 21  Thank you for allowing pharmacy to be a part of this patient's care.  Nani Skillern Crowford PharmD 09/06/2015 6:14 AM

## 2015-09-07 LAB — BASIC METABOLIC PANEL
ANION GAP: 9 (ref 5–15)
BUN: 47 mg/dL — AB (ref 6–20)
CALCIUM: 8.9 mg/dL (ref 8.9–10.3)
CO2: 38 mmol/L — ABNORMAL HIGH (ref 22–32)
Chloride: 95 mmol/L — ABNORMAL LOW (ref 101–111)
Creatinine, Ser: 2.8 mg/dL — ABNORMAL HIGH (ref 0.61–1.24)
GFR calc Af Amer: 25 mL/min — ABNORMAL LOW (ref >60)
GFR, EST NON AFRICAN AMERICAN: 21 mL/min — AB (ref >60)
GLUCOSE: 93 mg/dL (ref 65–99)
Potassium: 4.8 mmol/L (ref 3.5–5.1)
SODIUM: 142 mmol/L (ref 135–145)

## 2015-09-07 LAB — CBC
HCT: 25.2 % — ABNORMAL LOW (ref 39.0–52.0)
Hemoglobin: 7.8 g/dL — ABNORMAL LOW (ref 13.0–17.0)
MCH: 26.5 pg (ref 26.0–34.0)
MCHC: 31 g/dL (ref 30.0–36.0)
MCV: 85.7 fL (ref 78.0–100.0)
PLATELETS: 139 10*3/uL — AB (ref 150–400)
RBC: 2.94 MIL/uL — ABNORMAL LOW (ref 4.22–5.81)
RDW: 17.7 % — AB (ref 11.5–15.5)
WBC: 7.9 10*3/uL (ref 4.0–10.5)

## 2015-09-07 NOTE — Progress Notes (Signed)
[facility] PROGRESS NOTE  Wesley [name] [id number] DOB: 11-04-1945 DOA: 09/01/2015 PCP: [name], MD  Assessment/Plan: Active Problems:   Acute respiratory failure with hypoxia and hypercarbia ([place]) - Most likely due to aspiration pneumonia - Currently with supplemental oxygen at baseline prior to hospital admission. - Antibiotic regimen narrowed - pulmonology following - Will place PT evaluation order for d/c planning.  Aspiration pna - continue current antibiotic therapy - speech therapy has evaluated patient and made recommendations. Please refer to their note. Patient aware of recommendations.  Uncontrolled hypertension - will add prn hydralazine - pt currently on amlodipine and metoprolol    Chronic kidney disease (CKD), stage IV (severe) (HCC) - Stable Serum creatinine steady around 2.6    Acute encephalopathy - Most likely due to hypoxia/hypercarbia and toxic encephalopathy secondary to active infection - continue antibiotic therapy - improving.  Code Status: full Family Communication: discussed with wife as well  Disposition Plan: Most likely d/c next am.   Consultants:  None  Procedures:  Speech therapy  pccm  Antibiotics:  Levaquin  HPI/Subjective: Pt has no new complaints. No acute issues overnight. Feeling better  Objective: Filed Vitals:   09/07/15 0523 09/07/15 1112  BP: 154/72 129/63  Pulse: 58 64  Temp: 97.5 F (36.4 C)   Resp: 18     Intake/Output Summary (Last 24 hours) at 09/07/15 1349 Last data filed at 09/07/15 1300  Gross per 24 hour  Intake   1110 ml  Output    400 ml  Net    710 ml   Filed Weights   09/02/15 0130 09/06/15 0439 09/07/15 0523  Weight: 135.8 kg (299 lb 6.2 oz) 129.2 kg (284 lb 13.4 oz) 125.646 kg (277 lb)    Exam:   General:  Pt in nad, alert and awake  Cardiovascular: rrr, no mrg  Respiratory: equal chest rise, no wheezes, mild rhales over right lower lung field  Abdomen:  soft, Nd, NT  Musculoskeletal: no cyanosis or clubbing   Data Reviewed: Basic Metabolic Panel:  Recent Labs Lab 09/01/15 2030 09/02/15 0640 09/03/15 0406 09/04/15 0345 09/05/15 0647 09/07/15 0438  NA 147* 145 149* 147*  --  142  K 5.2* 4.8 4.8 4.7  --  4.8  CL 101 97* 100* 96*  --  95*  CO2 39* 37* 39* 40*  --  38*  GLUCOSE 127* 164* 107* 104*  --  93  BUN 55* 53* 60* 57*  --  47*  CREATININE 2.54* 2.77* 2.92* 2.69*  --  2.80*  CALCIUM 8.7* 8.4* 8.7* 8.8*  --  8.9  MG  --  1.8  --   --  1.9  --   PHOS  --  4.8*  --   --   --   --    Liver Function Tests:  Recent Labs Lab 09/01/15 2030  AST 14*  ALT 20  ALKPHOS 73  BILITOT 0.4  PROT 6.2*  ALBUMIN 2.9*   No results for input(s): LIPASE, AMYLASE in the last 168 hours. No results for input(s): AMMONIA in the last 168 hours. CBC:  Recent Labs Lab 09/01/15 2030 09/02/15 0640 09/03/15 0406 09/04/15 0345 09/07/15 0438  WBC 19.4* 13.3* 12.7* 10.7* 7.9  NEUTROABS 17.0*  --   --   --   --   HGB 8.6* 7.5* 7.6* 8.3* 7.8*  HCT 30.5* 27.1* 26.0* 28.8* 25.2*  MCV 89.7 89.7 86.4 86.7 85.7  PLT 175 163 164 173 139*   Cardiac Enzymes:  Recent Labs Lab 09/01/15 2030 09/02/15 0100 09/02/15 0640  TROPONINI 0.06* 0.08* 0.09*   BNP (last 3 results)  Recent Labs  04/18/15 1554 08/09/15 0746 09/01/15 2030  BNP 496.3* 657.8* 562.3*    ProBNP (last 3 results) No results for input(s): PROBNP in the last 8760 hours.  CBG: No results for input(s): GLUCAP in the last 168 hours.  Recent Results (from the past 240 hour(s))  Culture, blood (routine x 2)     Status: None   Collection Time: 09/01/15  9:00 PM  Result Value Ref Range Status   Specimen Description BLOOD RIGHT ANTECUBITAL  Final   Special Requests BOTTLES DRAWN AEROBIC AND ANAEROBIC 5CC  Final   Culture   Final    NO GROWTH 5 DAYS Performed at [facility]    Report Status 09/06/2015 FINAL  Final  Culture, blood (routine x 2)     Status: None    Collection Time: 09/01/15  9:20 PM  Result Value Ref Range Status   Specimen Description BLOOD BLOOD LEFT HAND  Final   Special Requests IN PEDIATRIC BOTTLE 3ML  Final   Culture   Final    NO GROWTH 5 DAYS Performed at [facility]    Report Status 09/06/2015 FINAL  Final  Respiratory virus panel     Status: None   Collection Time: 09/02/15  1:26 AM  Result Value Ref Range Status   Respiratory Syncytial Virus A Negative Negative Final   Respiratory Syncytial Virus B Negative Negative Final   Influenza A Negative Negative Final   Influenza B Negative Negative Final   Parainfluenza 1 Negative Negative Final   Parainfluenza 2 Negative Negative Final   Parainfluenza 3 Negative Negative Final   Metapneumovirus Negative Negative Final   Rhinovirus Negative Negative Final   Adenovirus Negative Negative Final    Comment: (NOTE) Performed At: [facility] [address] [place], [place] [id number] Lindon Romp MD [id number]   Culture, expectorated sputum-assessment     Status: None   Collection Time: 09/04/15  9:30 AM  Result Value Ref Range Status   Specimen Description SPUTUM  Final   Special Requests Normal  Final   Sputum evaluation   Final    THIS SPECIMEN IS ACCEPTABLE. RESPIRATORY CULTURE REPORT TO FOLLOW.   Report Status 09/04/2015 FINAL  Final  Culture, respiratory (NON-Expectorated)     Status: None   Collection Time: 09/04/15  9:30 AM  Result Value Ref Range Status   Specimen Description SPUTUM  Final   Special Requests NONE  Final   Gram Stain   Final    FEW WBC PRESENT, PREDOMINANTLY PMN FEW SQUAMOUS EPITHELIAL CELLS PRESENT FEW GRAM POSITIVE COCCI IN PAIRS Performed at [facility]    Culture   Final    NORMAL OROPHARYNGEAL FLORA Performed at [facility]    Report Status 09/06/2015 FINAL  Final     Studies: No results found.  Scheduled Meds: . amLODipine  10 mg Oral Daily  . antiseptic oral rinse  7 mL Mouth  Rinse q12n4p  . aspirin EC  81 mg Oral q morning - 10a  . chlorhexidine  15 mL Mouth Rinse BID  . heparin  5,000 Units Subcutaneous 3 times per day  . ipratropium-albuterol  3 mL Nebulization QID  . levofloxacin (LEVAQUIN) IV  750 mg Intravenous Q48H  . levothyroxine  125 mcg Oral QAC breakfast  . metoprolol tartrate  100 mg Oral BID  . pantoprazole  40 mg Oral Daily  . pravastatin  20 mg Oral q1800  . sertraline  150 mg Oral QHS  . sodium chloride  3 mL Intravenous Q12H   Continuous Infusions: . albuterol Stopped (09/02/15 0038)    Time spent: > 35 minutes   Velvet Bathe  [facility] Pager [phone]. If 7PM-7AM, please contact night-coverage at [email or web address], password [facility] 09/07/2015, 1:49 PM  LOS: 6 days

## 2015-09-07 NOTE — Progress Notes (Signed)
Physical Therapy Treatment Patient Details Name: Wesley [name] MRN: [id number] DOB: [birth year] Today's Date: 09/07/2015    History of Present Illness pt adm 09/01/15 with acute on chronic hypercarbic respiratory failure; PMHx:htn, obesity hypoventilation syndrome, CKD     PT Comments    Progressing with mobility. O2 sats dropped to 87% on 3L O2 with ambulation-recovered to 92% 3L with rest and cues for breathing. Pt tolerated session well.   Follow Up Recommendations  Home health PT;Supervision - Intermittent     Equipment Recommendations  None recommended by PT    Recommendations for Other Services       Precautions / Restrictions Precautions Precautions: Fall Precaution Comments: O2 dep Restrictions Weight Bearing Restrictions: No    Mobility  Bed Mobility               General bed mobility comments: NT--pt inrecliner--he sleeps in recliner at home  Transfers Overall transfer level: Needs assistance Equipment used: Rolling walker (2 wheeled) Transfers: Sit to/from Stand Sit to Stand: Min guard         General transfer comment: close guard for safety  Ambulation/Gait Ambulation/Gait assistance: Min guard Ambulation Distance (Feet): 85 Feet Assistive device: Rolling walker (2 wheeled) Gait Pattern/deviations: Step-through pattern;Decreased stride length     General Gait Details: close guard for safety. O2 sats dropped to 87% on 3L O2 during ambulation.    Stairs            Wheelchair Mobility    Modified Rankin (Stroke Patients Only)       Balance                                    Cognition Arousal/Alertness: Awake/alert Behavior During Therapy: WFL for tasks assessed/performed Overall Cognitive Status: Within Functional Limits for tasks assessed                      Exercises      General Comments        Pertinent Vitals/Pain Pain Assessment: No/denies pain    Home Living                       Prior Function            PT Goals (current goals can now be found in the care plan section) Progress towards PT goals: Progressing toward goals    Frequency  Min 3X/week    PT Plan Current plan remains appropriate    Co-evaluation             End of Session Equipment Utilized During Treatment: Oxygen Activity Tolerance: Patient tolerated treatment well Patient left: in chair;with call bell/phone within reach     Time: 1550-1617 PT Time Calculation (min) (ACUTE ONLY): 27 min  Charges:  $Gait Training: 8-22 mins                    G Codes:      Weston Anna, MPT Pager: [phone]

## 2015-09-07 NOTE — Progress Notes (Signed)
Nutrition Follow-up  DOCUMENTATION CODES:   Morbid obesity  INTERVENTION:  - Continue Dysphagia 3, nectar-thick diet - RD will continue to monitor for needs  NUTRITION DIAGNOSIS:   Swallowing difficulty related to dysphagia as evidenced by per patient/family report. -ongoing but on appropriate diet  GOAL:   Patient will meet greater than or equal to 90% of their needs -met on average  MONITOR:   PO intake, Weight trends, Labs, I & O's  ASSESSMENT:   69 year old male with PMH as below, which includes OHS on home O2 (followed by VS), TBI, HTN,, Pulmonary HTN secondary to mod-severe AS. He has 2 recent admission for hypoxemic respiratory failure thought secondary to aspiration PNA. He was initially treated with IV antibiotics which were switched to PO with subsequent deterioration. He was restarted on IV, and was eventually discharged on PO cefpodoxime and flagyl. He has a remote history of achalasia and often has swallowing difficulties increasing concern for aspitration. He was evaluated by ST during last admission who recommended dysphagia 3 diet and nectar thick. After discharge 12/17 he continued to feel week but had no complaints of SOB/CP/Cough. 12/20 he had acute decompensation with hypoxemia and O2 sats in 60s at home. Wife turned home O2 (chronically 3 L) up to 10 liters with no improvement. He was also lethargic. Wife called EMS. Upon arrival to ED ABG was obtained and demonstrated respiratory acidosis. He was started on BiPAP. His mental status improved, however ABG did not. CXR demonstrated persistent Bilateral, but primarily RML/RLL opacification.   12/26 Per chart review, pt ate 25% of lunch and dinner yesterday and 75% of breakfast this AM. Pt reports that since he is not moving much he has not been eating as much as he once did. He states that for breakfast this AM he had eggs, a biscuit with jelly and butter, breakfast potatoes, and sausage and drank milk. Pt denies any  discomfort with eating and denies chewing or swallowing issues during breakfast this AM. He states due to a large breakfast he only had banana pudding and 2 cartons of milk for lunch but plans to eat a large dinner.  Pt denies questions or concerns for RD at this time. Meeting needs on average. Medications reviewed. Labs reviewed; Cl: 95 mmol/L, BUN/creatinine elevated, GFR: 21.    12/22 - Pt on Dysphagia 3, Nectar-thick liquids and had MBS this afternoon; SLP note today at 1347 indicates that following this testing continue to recommend current diet order. -  Per chart review, pt ate 75% of lunch yesterday (12/21) and 100% of breakfast this AM.  - Talked with pt and wife - Pt reports that since admission and PTA he has maintained a very good appetite.  - Chart review indicates that pt has gained 16 lbs in the past 1 month; will continue to monitor weight trends throughout hospitalization.  - Pt's wife expresses concern that on previous hospitalizations she has requested to talk with RD concerning diet texture and that staff members have informed her that this would not be able to occur and have simply provided her with a handout.  - She asks multiple questions about food texture and about liquids.  - Provided her with handout from the [employer] outlining appropriate foods on Dysphagia 3 diet but did make not of items listed within this that would be considered thin liquids (for example: ice cream) and that pt would be unable to have due to need for thickened liquids.  -  Talked with wife about appropriate and inappropriate foods from each food group on Dysphagia 3 diet   - NP note today at [phone] indicates with respect to aspiration PNA: If this continues to be a recurring issue problem then he may need a PEG ultimately. Notes indicate that pt has had a PEG in the past.    Diet Order:  DIET DYS 3 Room service appropriate?: Yes; Fluid consistency:: Nectar Thick  Skin:   Reviewed, no issues  Last BM:  12/26  Height:   Ht Readings from Last 1 Encounters:  09/02/15 '5\' 10"'$  (1.778 m)    Weight:   Wt Readings from Last 1 Encounters:  09/07/15 277 lb (125.646 kg)    Ideal Body Weight:  75.45 kg (kg)  BMI:  Body mass index is 39.75 kg/(m^2).  Estimated Nutritional Needs:   Kcal:  1025-4862  Protein:  100-120 grams  Fluid:  2.2-2.5 L/day  EDUCATION NEEDS:   No education needs identified at this time     Jarome Matin, RD, LDN Inpatient Clinical Dietitian Pager # [phone] After hours/weekend pager # [phone]

## 2015-09-07 NOTE — Progress Notes (Signed)
PULMONARY / CRITICAL CARE MEDICINE   Name: Wesley [name] MRN: [id number] DOB: [birth year]    ADMISSION DATE:  09/01/2015 CONSULTATION DATE:  09/01/2015  REFERRING MD:  EDP Pfeiffer  CHIEF COMPLAINT:  SOB  STUDIES:  PSG 06/03/13: AHI 5, SpO2 low 82%, REM 16. 101 min with SpO2 < 88%. Needed 1 liter oxygen. ONO with RA 06/05/13: Test time 8 hrs 31 min. Mean SpO2 84%, low SpO2 59%. Spent 8 hrs 1 min with SpO2 < 88%. ONO with 2 liters 06/27/13: Test time 7 hrs 25 min. Basal SpO2 91.9%, low SpO2 76%. Spent 11 min with SpO2 < 88%. TTE 07/07/2015:  Could not eval LVEF (prior study 55-60%), mod-severe AS (new from prior) CT chest  12/20: multifocal PNA, small hiatal hernia with patulous appearing esophagus.  TTE 12/20: EF [id number], Grade 2 diastolic dysfunction, dilated RA   CULTURES: Blood 12/20:  Negative x2  Sputum 12/20:  Oral [place] Urine Strep Ag 12/20:  Negative Urine Legionella Ag 12/20:  Negative  Respiratory Panel PCR 12/21:  Negative  ANTIBIOTICS: Levaquin 12/25>>> Cefepime 12/20 - 12/25 Vancomycin 12/20 - 12/25 Azithromycin 12/20 - 12/25  SIGNIFICANT EVENTS: 12/17 - discharged on oral ABX for PNA 12/20 - re-admit for PNA, Resp failure on BiPAP 12/21 - off BiPAP  LINES/TUBES: PIVx1  SUBJECTIVE: Denies any chest pain or pressure. Denies any subjective fever, chills, or sweats. Patient reports minimal dyspnea and no appreciable coughing. He has been ambulating with the use of a rolling walker in the hallways as well.  REVIEW OF SYSTEMS:  No abdominal pain or nausea. Wife reports he hasn't been coughing with by mouth intake. No diarrhea. No rashes. No headaches.  VITAL SIGNS: BP 129/63 mmHg  Pulse 64  Temp(Src) 97.5 F (36.4 C) (Oral)  Resp 18  Ht 5\' 10"  (1.778 m)  Wt 277 lb (125.646 kg)  BMI 39.75 kg/m2  SpO2 97%  HEMODYNAMICS:    VENTILATOR SETTINGS:    INTAKE / OUTPUT: I/O last 3 completed shifts: In: 1910 [P.O.:960; IV Piggyback:950] Out: 825  [Urine:825]  PHYSICAL EXAMINATION: General:  Obese. No distress. In bathroom on my arrival to his room. Wife at bedside. Neuro:  No distress. Grossly nonfocal. Oriented to person, place, year, and season. HEENT:  No oral ulcers. No scleral injection.Membranes. Cardiovascular:  Regular rhythm. Mild lower extremity edema. Unable to appreciate JVD given body positioning. Lungs:  Normal work of breathing before and after ambulation in the room. Speaking in complete sentences. Slightly diminished breath sounds bilateral lung bases. Abdomen:  Protuberant. Grossly nontender. Normal bowel sounds. Integument: Warm and dry. No rash on exposed skin.  LABS:  BMET  Recent Labs Lab 09/03/15 0406 09/04/15 0345 09/07/15 0438  NA 149* 147* 142  K 4.8 4.7 4.8  CL 100* 96* 95*  CO2 39* 40* 38*  BUN 60* 57* 47*  CREATININE 2.92* 2.69* 2.80*  GLUCOSE 107* 104* 93    Electrolytes  Recent Labs Lab 09/02/15 0640 09/03/15 0406 09/04/15 0345 09/05/15 0647 09/07/15 0438  CALCIUM 8.4* 8.7* 8.8*  --  8.9  MG 1.8  --   --  1.9  --   PHOS 4.8*  --   --   --   --     CBC  Recent Labs Lab 09/03/15 0406 09/04/15 0345 09/07/15 0438  WBC 12.7* 10.7* 7.9  HGB 7.6* 8.3* 7.8*  HCT 26.0* 28.8* 25.2*  PLT 164 173 139*    Coag's No results for input(s): APTT, INR in the last  168 hours.  Sepsis Markers  Recent Labs Lab 09/02/15 0640 09/03/15 0406 09/04/15 0345  PROCALCITON 0.48 0.37 0.35    ABG  Recent Labs Lab 09/01/15 2059 09/01/15 2137 09/02/15 0030  PHART 7.210* 7.225* 7.249*  PCO2ART 95.3* 94.5* 87.6*  PO2ART 75.1* 81.3 67.2*    Liver Enzymes  Recent Labs Lab 09/01/15 2030  AST 14*  ALT 20  ALKPHOS 73  BILITOT 0.4  ALBUMIN 2.9*    Cardiac Enzymes  Recent Labs Lab 09/01/15 2030 09/02/15 0100 09/02/15 0640  TROPONINI 0.06* 0.08* 0.09*    Glucose No results for input(s): GLUCAP in the last 168 hours.  Imaging No results found.   ASSESSMENT / PLAN:   69 year old male admitted with acute respiratory failure due to recurrent PNA r/t aspiration with a likely component of pulmonary edema. Initially treated with BiPAP. Has ongoing issues with aspiration precipitating his pneumonia. Patient is recovering well and has improving functional capabilities. Appears largely to be euvolemic at this time. Seems to be recovering well from his respiratory failure. He may benefit from outpatient physical therapy and PT has been consulted. Patient's antibiotic regimen was consolidated to Levaquin yesterday which should be sufficient to cover his aspiration pneumonia.  1. Acute on Chronic Hypoxic Respiratory Failure:  Improving. Secondary to aspiration pneumonia.Tolerating wean of FiO2 with goal saturation 88-92% to prevent V/Q mismatching. 2. Acute on Chronic Hypercarbic Respiratory Failure:  Resolved. Secondary to underlying OHS. Previously intolerant to BiPAP due to claustrophobia. Off BiPAP since 12/21. 3. Aspiration Pneumonia:  Clinically improving. Speech therapy following. Dysphagia 3 modified diet. Broad spectrum antibiotics from 12/20-12/25 consolidated to [place] on 12/25. Currently on Day #7 of 14 of antibiotics. 4. Acute on Chronic Renal Failure:  Patient is CKD Stage IV. Recommend continuing to monitor renal function daily with BUN/Creatinine.  5. Follow-up: Patient already has a follow-up appointment scheduled in our office in January. I did recommend to his wife to obtain an interim follow-up with his primary care physician.  Sonia Baller Ashok Cordia, M.D. [facility] Pager:  [phone] After 3pm or if no response, call [phone] 09/07/2015, 12:03 PM

## 2015-09-08 MED ORDER — LEVOFLOXACIN 750 MG PO TABS: 750.0000 mg | ORAL_TABLET | Freq: Every day | ORAL | Status: DC

## 2015-09-08 NOTE — Progress Notes (Signed)
Right at discharge, spouse expressed concerns regarding antibiotic and days of use. Spouse thought abx would be prescribed more days. Offer made to contact provider, spouse stated she did not want to wait for provider to call back

## 2015-09-08 NOTE — Discharge Summary (Signed)
Physician Discharge Summary  KASSIM FRISQUE [id number] DOB: 12-04-1945 DOA: 09/01/2015  PCP: [name], MD  Admit date: 09/01/2015 Discharge date: 09/08/2015  Time spent: > 35 minutes  Recommendations for Outpatient Follow-up:  1. Monitor hemoglobin levels 2. Monitor serum creatinine   Discharge Diagnoses:  Active Problems:   Aspiration pneumonia (HCC)   Acute respiratory failure with hypoxia and hypercarbia (HCC)   Chronic kidney disease (CKD), stage IV (severe) (HCC)   Acute encephalopathy   Esophageal dysmotility   Discharge Condition: stable  Diet recommendation: dysphagia 3 diet  Filed Weights   09/02/15 0130 09/06/15 0439 09/07/15 0523  Weight: 135.8 kg (299 lb 6.2 oz) 129.2 kg (284 lb 13.4 oz) 125.646 kg (277 lb)    History of present illness:  69 y/o with history of OHS on home O2 (followed by [facility]), TBI, HTN,, Pulmonary HTN secondary to mod-severe AS who presented with difficulty breathing. Patient has history of multiple admission secondary to aspiration pneumonia.  Hospital Course:  Acute respiratory failure with hypoxia and hypercarbia (HCC) - Most likely due to aspiration pneumonia - Currently with supplemental oxygen at baseline prior to hospital admission. - Antibiotic regimen narrowed and patient doing well. He is looking forward to going home. - d/c with home health and speech therapy/PT  Aspiration pna - continue current antibiotic therapy - speech therapy has evaluated patient and made recommendations. Please refer to EMR  htn - continue home regimen.   Chronic kidney disease (CKD), stage IV (severe) (HCC) - Stable Serum creatinine steady around 2.6   Acute encephalopathy - Most likely due to hypoxia/hypercarbia and toxic encephalopathy secondary to active infection - continue antibiotic therapy - resolved   Procedures:  None  Consultations:  None  Discharge Exam: Filed Vitals:   09/08/15 0529 09/08/15 1007  BP:  138/71 125/55  Pulse: 65   Temp: 98 F (36.7 C)   Resp: 18     General: Pt in nad, alert and awake Cardiovascular: rrr, no mrg Respiratory: cta bl, no wheezes, equal chest rise.  Discharge Instructions   Discharge Instructions    Call MD for:  difficulty breathing, headache or visual disturbances    Complete by:  As directed      Call MD for:  temperature >100.4    Complete by:  As directed      Diet - low sodium heart healthy    Complete by:  As directed      Discharge instructions    Complete by:  As directed   Please follow up with your primary care physician in 1-2 weeks or sooner.     Increase activity slowly    Complete by:  As directed           Current Discharge Medication List    START taking these medications   Details  levofloxacin (LEVAQUIN) 750 MG tablet Take 1 tablet (750 mg total) by mouth daily. Qty: 1 tablet, Refills: 0      CONTINUE these medications which have NOT CHANGED   Details  albuterol (PROVENTIL HFA;VENTOLIN HFA) 108 (90 BASE) MCG/ACT inhaler Inhale 2 puffs into the lungs every 6 (six) hours as needed for wheezing or shortness of breath. Qty: 1 Inhaler, Refills: 3    albuterol (PROVENTIL) (2.5 MG/3ML) 0.083% nebulizer solution Take 2.5 mg by nebulization every 6 (six) hours as needed for wheezing or shortness of breath.     amLODipine (NORVASC) 10 MG tablet Take 10 mg by mouth every morning.     aspirin  EC 81 MG tablet Take 81 mg by mouth every morning.    carboxymethylcellulose (REFRESH) 1 % ophthalmic solution Apply 1 drop to eye 3 (three) times daily.    cholecalciferol (VITAMIN D) 1000 UNITS tablet Take 1,000 Units by mouth daily.     docusate sodium (COLACE) 100 MG capsule Take 100 mg by mouth daily.    gabapentin (NEURONTIN) 300 MG capsule Take 300 mg by mouth at bedtime.     galantamine (RAZADYNE ER) 16 MG 24 hr capsule Take 16 mg by mouth at bedtime.     levothyroxine (SYNTHROID, LEVOTHROID) 125 MCG tablet Take 125 mcg by  mouth daily before breakfast.    Maltodextrin-Xanthan Gum (RESOURCE THICKENUP CLEAR) POWD Please dispense 1 box ( 1 month supply) Qty: 1 Can, Refills: 1    metoprolol (LOPRESSOR) 100 MG tablet Take 100 mg by mouth 2 (two) times daily.    pantoprazole (PROTONIX) 40 MG tablet Take 1 tablet (40 mg total) by mouth 2 (two) times daily. Qty: 60 tablet, Refills: 0    pravastatin (PRAVACHOL) 20 MG tablet Take 20 mg by mouth at bedtime.    !! QUEtiapine (SEROQUEL) 100 MG tablet Take 100 mg by mouth at bedtime. Take with 25 mg tablet to equal a total dose of 125 mg    !! QUEtiapine (SEROQUEL) 25 MG tablet Take 25 mg by mouth at bedtime. Take with 100mg  tablet to equal a total dose of 125 mg    sertraline (ZOLOFT) 100 MG tablet Take 150 mg by mouth at bedtime.    Tamsulosin HCl (FLOMAX) 0.4 MG CAPS Take 0.4 mg by mouth daily after breakfast. Once a day     !! - Potential duplicate medications found. Please discuss with provider.    STOP taking these medications     cefpodoxime (VANTIN) 200 MG tablet      metroNIDAZOLE (FLAGYL) 500 MG tablet      predniSONE (DELTASONE) 20 MG tablet        No Known Allergies    The results of significant diagnostics from this hospitalization (including imaging, microbiology, ancillary and laboratory) are listed below for reference.    Significant Diagnostic Studies: Dg Chest 2 View  08/27/2015  CLINICAL DATA:  Cough. Aspiration pneumonia. Acute respiratory failure with hypoxia. Chronic kidney disease stage 4. Oropharyngeal Carcinoma. EXAM: CHEST  2 VIEW COMPARISON:  08/25/2015 FINDINGS: There has been interval improvement in diffuse bilateral airspace disease since previous study. Probable small left subpulmonic pleural effusion again noted. No pneumothorax visualized. Heart size is stable. IMPRESSION: Interval improvement in diffuse bilateral airspace disease since previous study. Electronically Signed   By: Earle Gell M.D.   On: 08/27/2015 08:55   Dg  Chest 2 View  08/20/2015  CLINICAL DATA:  69 year old male with shortness of breath. Subsequent encounter. EXAM: CHEST  2 VIEW COMPARISON:  Several prior chest x-rays most recent 08/09/2015. FINDINGS: Bilateral diffuse airspace disease sparing the right upper lobe has progressed when compared to prior examination (most notable change left upper lobe). This may represent multi focal pneumonia but will need to be followed until complete clearance to exclude underlying malignancy. Limited for detecting pulmonary edema given the diffuse airspace disease. Cardiomegaly. No pneumothorax. IMPRESSION: Bilateral diffuse airspace disease sparing the right upper lobe has progressed when compared to prior examination (most notable change left upper lobe). This may represent multi focal pneumonia but will need to be followed until complete clearance to exclude underlying malignancy. Limited for detecting pulmonary edema given the diffuse airspace  disease. Cardiomegaly. Electronically Signed   By: Genia Del M.D.   On: 08/20/2015 12:46   Ct Chest Wo Contrast  09/01/2015  CLINICAL DATA:  56-year-old male with respiratory favor. Patient was admitted to the hospital for pneumonia recently. EXAM: CT CHEST WITHOUT CONTRAST TECHNIQUE: Multidetector CT imaging of the chest was performed following the standard protocol without IV contrast. COMPARISON:  Chest radiograph dated 09/01/2015 FINDINGS: Evaluation of this exam is limited in the absence of intravenous contrast. There is near complete consolidation of the left lower lobe with air bronchogram. Patchy areas of airspace opacity and consolidation no the bilaterally most compatible with multifocal pneumonia. Clinical correlation and follow-up resolution recommended. There are small bilateral pleural effusions, right greater than left no pneumothorax. The central airways are patent. Atherosclerotic calcification of the thoracic aorta. There is cardiomegaly. No pericardial  effusion. There is coronary vascular calcification. There is top-normal mediastinal lymph nodes. Bilateral hilar adenopathy may be present. Evaluation of the hila and lymph nodes is limited in the absence of intravenous contrast. There is a small hiatal hernia with patulous appearance esophagus containing oral content. There is no axillary adenopathy. The chest wall soft tissues appear unremarkable. There is degenerative changes of the spine. Old healed left posterior rib fracture. No acute fracture identified. Diverticulosis of the splenic flexure. The visualized upper abdomen is otherwise grossly unremarkable. IMPRESSION: Multi focal pneumonia. Clinical correlation and follow-up resolution recommended. Small hiatal hernia with patulous appearing esophagus. Aspiration pneumonia is not excluded. Further evaluation with speech pathology recommended. Electronically Signed   By: Anner Crete M.D.   On: 09/01/2015 23:53   Dg Chest Port 1 View  09/03/2015  CLINICAL DATA:  Pneumonia. EXAM: PORTABLE CHEST 1 VIEW COMPARISON:  Chest CT and radiographs 09/01/2015 FINDINGS: Cardiac silhouette is partially obscured but again appears enlarged. Multifocal airspace consolidation is again seen bilaterally, greatest in the bases. Right basilar consolidation has mildly improved from the prior radiographs. No large pleural effusion or pneumothorax is identified. IMPRESSION: Bilateral lung opacities compatible with pneumonia. Mild improvement of right base aeration. Electronically Signed   By: Logan Bores M.D.   On: 09/03/2015 07:10   Dg Chest Portable 1 View  09/01/2015  CLINICAL DATA:  Shortness of Breath EXAM: PORTABLE CHEST 1 VIEW COMPARISON:  August 27, 2015 FINDINGS: There is increase in consolidation in the right base. Patchy infiltrate remains in both upper lobes and left base region without change. Heart is enlarged with pulmonary vascularity within normal limits. No adenopathy appreciable. IMPRESSION: Airspace  opacity bilaterally, increased in the right base and stable elsewhere. Stable cardiomegaly. Suspect multifocal pneumonia, although a degree of underlying congestive heart failure may be present. Both congestive heart failure and pneumonia may exist concurrently. Electronically Signed   By: Lowella Grip III M.D.   On: 09/01/2015 19:27   Dg Chest Port 1 View  08/25/2015  CLINICAL DATA:  Follow-up aspiration pneumonia. Cough, congestion, shortness of Breath EXAM: PORTABLE CHEST 1 VIEW COMPARISON:  08/20/2015 FINDINGS: Severe patchy bilateral airspace disease again noted, increasing throughout the right lung, stable on the left. Possible small effusions. There is cardiomegaly. No acute bony abnormality. IMPRESSION: Severe patchy bilateral airspace disease again noted, worsening on the right since prior study. Suspect small effusions. Electronically Signed   By: Rolm Baptise M.D.   On: 08/25/2015 08:58   Dg Swallowing Func-speech Pathology  09/04/2015  Valere Dross, [place]     09/04/2015  9:21 AM Objective Swallowing Evaluation: MBS-Modified Barium Swallow Study Patient Details Name:  ANUBIS BLEWITT MRN: [id number] Date of Birth: Oct 13, 1945 Today's Date: 09/03/2015 Time: SLP Start Time (ACUTE ONLY): 1230-SLP Stop Time (ACUTE ONLY): 1250 SLP Time Calculation (min) (ACUTE ONLY): 20 min Past Medical History: Past Medical History Diagnosis Date . Chronic kidney disease    Stage IV . Oropharyngeal cancer ([place])  . HTN (hypertension)  . Aortic stenosis    mod-severe . Morbid obesity ([place])  . Pulmonary hypertension ([place])    secondary . Obesity hypoventilation syndrome (HCC)    Intolerant of NIMV . Achalasia  . PTSD (post-traumatic stress disorder)  . Hypothyroidism  . TBI (traumatic brain injury) ([place])  . 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 Knee arthroscopy  11/08/2002   right knee examination . Knee arthroscopy w/ partial medial  meniscectomy  11/08/2002 . Chondroplasty  11/08/2002   extensive, of the patellofemoral joint . Chondroplasty  11/08/2002   lesser extent chondroplasty medial femoral condyle HPI: 69 year old male with PMH:  OHS on home O2, TBI, HTN,, Pulmonary HTN, dysphagia, oropharyngeal cancer, achalasia. BSE 08/22/2015 s/s aspiration present, hesitant but agreeable to nectar liquids, Dys 3 with chin tuck (decreased, not eliminated s/s aspiration during BSE). Admitted 12/20 due to acute decompensation with hypoxemia and O2 sats in 60s at home. CXR multi focal pneumonia, small hiatal hernia with patulous appearance esophagus (i.e widened area often associated with Barrett's Esophagus) containing oral content. Speech Pathology assessment recommended in CT report. Pt able to have MBS in the newly revevated xray room for bariatric population.  Subjective: pt describes a long h/o dysphagia (oropharyngeal and esophageal in nature) Assessment / Plan / Recommendation CHL IP CLINICAL IMPRESSIONS 09/03/2015 Therapy Diagnosis Moderate pharyngeal phase dysphagia Clinical Impression Pt exhibited moderate motor based pharyngeal dysphagia due to decreased laryngeal closure resulting in silent aspiration of thin barium while utilizing a chin tuck posture. Intermittent mild penetration after the swallow from trace-mild vallecular residue removed with cues for volitional cough and x 1 during swallow due to large sip. Upper portion only of esophagus viewed due to inability to lower xray machine with width of chair with barium stasis in wide proximal esophagus. Recommend Dys 3 texture and nectar thick liquids with continuing chin tuck posture, volitional throat clear/cough after every several sips, alternate bites and sips, small sips and stay upright 1 hour after meals. Aspiration risk is chronic due to significant esophageal dysphagia (previously diagnosed) and pt's difficulty complying with safe swallow precautions primarily due to cognitive/memory  deficits.     Impact on safety and function (No Data)   CHL IP TREATMENT RECOMMENDATION 09/03/2015 Treatment Recommendations Therapy as outlined in treatment plan below   Prognosis 09/03/2015 Prognosis for Safe Diet Advancement Fair Barriers to Reach Goals Cognitive deficits;Behavior Barriers/Prognosis Comment -- CHL IP DIET RECOMMENDATION 09/03/2015 SLP Diet Recommendations Dysphagia 3 (Mech soft) solids;Nectar thick liquid Liquid Administration via Cup;No straw Medication Administration Whole meds with puree Compensations Small sips/bites;Slow rate;Follow solids with liquid;Clear throat intermittently;Chin tuck Postural Changes Seated upright at 90 degrees;Remain semi-upright after after feeds/meals (Comment)   CHL IP OTHER RECOMMENDATIONS 09/03/2015 Recommended Consults -- Oral Care Recommendations Oral care BID Other Recommendations --   CHL IP FOLLOW UP RECOMMENDATIONS 09/02/2015 Follow up Recommendations (No Data)   CHL IP FREQUENCY AND DURATION 09/03/2015 Speech Therapy Frequency (ACUTE ONLY) min 2x/week Treatment Duration 2 weeks      CHL IP ORAL PHASE 09/03/2015 Oral Phase WFL Oral - Pudding Teaspoon -- Oral - Pudding Cup --  Oral - Honey Teaspoon -- Oral - Honey Cup -- Oral - Nectar Teaspoon -- Oral - Nectar Cup -- Oral - Nectar Straw -- Oral - Thin Teaspoon -- Oral - Thin Cup -- Oral - Thin Straw -- Oral - Puree -- Oral - Mech Soft -- Oral - Regular -- Oral - Multi-Consistency -- Oral - Pill -- Oral Phase - Comment --  CHL IP PHARYNGEAL PHASE 09/03/2015 Pharyngeal Phase Impaired Pharyngeal- Pudding Teaspoon -- Pharyngeal -- Pharyngeal- Pudding Cup -- Pharyngeal -- Pharyngeal- Honey Teaspoon -- Pharyngeal -- Pharyngeal- Honey Cup -- Pharyngeal -- Pharyngeal- Nectar Teaspoon -- Pharyngeal -- Pharyngeal- Nectar Cup Penetration/Apiration after swallow;Pharyngeal residue - valleculae;Reduced tongue base retraction Pharyngeal Material enters airway, remains ABOVE vocal cords and not ejected out Pharyngeal-  Nectar Straw -- Pharyngeal -- Pharyngeal- Thin Teaspoon -- Pharyngeal -- Pharyngeal- Thin Cup Penetration/Aspiration during swallow;Pharyngeal residue - valleculae;Reduced airway/laryngeal closure;Reduced tongue base retraction Pharyngeal Material enters airway, passes BELOW cords without attempt by patient to eject out (silent aspiration) Pharyngeal- Thin Straw -- Pharyngeal -- Pharyngeal- Puree -- Pharyngeal -- Pharyngeal- Mechanical Soft -- Pharyngeal -- Pharyngeal- Regular Pharyngeal residue - valleculae;Reduced tongue base retraction Pharyngeal -- Pharyngeal- Multi-consistency -- Pharyngeal -- Pharyngeal- Pill Pharyngeal residue - valleculae;Reduced tongue base retraction Pharyngeal -- Pharyngeal Comment --  CHL IP CERVICAL ESOPHAGEAL PHASE 09/03/2015 Cervical Esophageal Phase WFL Pudding Teaspoon -- Pudding Cup -- Honey Teaspoon -- Honey Cup -- Nectar Teaspoon -- Nectar Cup -- Nectar Straw -- Thin Teaspoon -- Thin Cup -- Thin Straw -- Puree -- Mechanical Soft -- Regular -- Multi-consistency -- Pill -- Cervical Esophageal Comment -- [place] Siren 09/03/2015, 1:47 PM Orbie Pyo Litaker M.Ed [employer] [phone]               Microbiology: Recent Results (from the past 240 hour(s))  Culture, blood (routine x 2)     Status: None   Collection Time: 09/01/15  9:00 PM  Result Value Ref Range Status   Specimen Description BLOOD RIGHT ANTECUBITAL  Final   Special Requests BOTTLES DRAWN AEROBIC AND ANAEROBIC 5CC  Final   Culture   Final    NO GROWTH 5 DAYS Performed at [facility]    Report Status 09/06/2015 FINAL  Final  Culture, blood (routine x 2)     Status: None   Collection Time: 09/01/15  9:20 PM  Result Value Ref Range Status   Specimen Description BLOOD BLOOD LEFT HAND  Final   Special Requests IN PEDIATRIC BOTTLE 3ML  Final   Culture   Final    NO GROWTH 5 DAYS Performed at [facility]    Report Status 09/06/2015 FINAL  Final  Respiratory virus panel      Status: None   Collection Time: 09/02/15  1:26 AM  Result Value Ref Range Status   Respiratory Syncytial Virus A Negative Negative Final   Respiratory Syncytial Virus B Negative Negative Final   Influenza A Negative Negative Final   Influenza B Negative Negative Final   Parainfluenza 1 Negative Negative Final   Parainfluenza 2 Negative Negative Final   Parainfluenza 3 Negative Negative Final   Metapneumovirus Negative Negative Final   Rhinovirus Negative Negative Final   Adenovirus Negative Negative Final    Comment: (NOTE) Performed At: [facility] [address] [place], [place] [id number] Lindon Romp MD [id number]   Culture, expectorated sputum-assessment     Status: None   Collection Time: 09/04/15  9:30 AM  Result Value Ref Range Status  Specimen Description SPUTUM  Final   Special Requests Normal  Final   Sputum evaluation   Final    THIS SPECIMEN IS ACCEPTABLE. RESPIRATORY CULTURE REPORT TO FOLLOW.   Report Status 09/04/2015 FINAL  Final  Culture, respiratory (NON-Expectorated)     Status: None   Collection Time: 09/04/15  9:30 AM  Result Value Ref Range Status   Specimen Description SPUTUM  Final   Special Requests NONE  Final   Gram Stain   Final    FEW WBC PRESENT, PREDOMINANTLY PMN FEW SQUAMOUS EPITHELIAL CELLS PRESENT FEW GRAM POSITIVE COCCI IN PAIRS Performed at [facility]    Culture   Final    NORMAL OROPHARYNGEAL FLORA Performed at [facility]    Report Status 09/06/2015 FINAL  Final     Labs: Basic Metabolic Panel:  Recent Labs Lab 09/01/15 2030 09/02/15 0640 09/03/15 0406 09/04/15 0345 09/05/15 0647 09/07/15 0438  NA 147* 145 149* 147*  --  142  K 5.2* 4.8 4.8 4.7  --  4.8  CL 101 97* 100* 96*  --  95*  CO2 39* 37* 39* 40*  --  38*  GLUCOSE 127* 164* 107* 104*  --  93  BUN 55* 53* 60* 57*  --  47*  CREATININE 2.54* 2.77* 2.92* 2.69*  --  2.80*  CALCIUM 8.7* 8.4* 8.7* 8.8*  --  8.9  MG  --  1.8  --    --  1.9  --   PHOS  --  4.8*  --   --   --   --    Liver Function Tests:  Recent Labs Lab 09/01/15 2030  AST 14*  ALT 20  ALKPHOS 73  BILITOT 0.4  PROT 6.2*  ALBUMIN 2.9*   No results for input(s): LIPASE, AMYLASE in the last 168 hours. No results for input(s): AMMONIA in the last 168 hours. CBC:  Recent Labs Lab 09/01/15 2030 09/02/15 0640 09/03/15 0406 09/04/15 0345 09/07/15 0438  WBC 19.4* 13.3* 12.7* 10.7* 7.9  NEUTROABS 17.0*  --   --   --   --   HGB 8.6* 7.5* 7.6* 8.3* 7.8*  HCT 30.5* 27.1* 26.0* 28.8* 25.2*  MCV 89.7 89.7 86.4 86.7 85.7  PLT 175 163 164 173 139*   Cardiac Enzymes:  Recent Labs Lab 09/01/15 2030 09/02/15 0100 09/02/15 0640  TROPONINI 0.06* 0.08* 0.09*   BNP: BNP (last 3 results)  Recent Labs  04/18/15 1554 08/09/15 0746 09/01/15 2030  BNP 496.3* 657.8* 562.3*    ProBNP (last 3 results) No results for input(s): PROBNP in the last 8760 hours.  CBG: No results for input(s): GLUCAP in the last 168 hours.     Signed:  Velvet Bathe MD  FACP  [facility] 09/08/2015, 1:07 PM

## 2015-09-08 NOTE — Care Management Note (Signed)
Case Management Note  Patient Details  Name: Wesley [name] MRN: [id number] Date of Birth: 07-30-1946  Subjective/Objective: Spoke @ length to patient/spouse about d/c plan process.  Informed that recommendations are made, then the MD will assess recommendations & put in orders if needed.  The case manager will provide needed resources based on the orders if Home Health is ordered.  Spouse voiced understanding.  She also informed me of her experience in the otpt setting from last admission-HHC was not ordered, therefore pcp office had to set up [facility], that was a long process, then patient was re-admitted. Spouse chose [facility] for HHC-HHPT/HHST, rep Santiago Glad verified insurance, & able to accept patient.Spouse w/no further concerns, & appreciated my assistance.                  Action/Plan:d/c home w/HHC.   Expected Discharge Date:                  Expected Discharge Plan:  [name]  In-House Referral:  Clinical Social Work  Discharge planning Services  CM Consult  Post Acute Care Choice:    Choice offered to:  Patient, Spouse  DME Arranged:    DME Agency:  [place]:  PT, Speech Therapy HH Agency:     Status of Service:  Completed, signed off  Medicare Important Message Given:    Date Medicare IM Given:    Medicare IM give by:    Date Additional Medicare IM Given:    Additional Medicare Important Message give by:     If discussed at [place] of Stay Meetings, dates discussed:    Additional Comments:  Dessa Phi, RN 09/08/2015, 1:45 PM

## 2015-09-08 NOTE — Progress Notes (Signed)
Patient is A&O and ambulatory with 1 assist and walker. Discharge instruction reviewed, post dc appt given. Questions, concerns were denied at time of DC

## 2015-09-24 ENCOUNTER — Ambulatory Visit (INDEPENDENT_AMBULATORY_CARE_PROVIDER_SITE_OTHER)
Admission: RE | Admit: 2015-09-24 | Discharge: 2015-09-24 | Disposition: A | Discharge status: Home / Self Care | Payer: Medicare Other | Source: Ambulatory Visit | Attending: Pulmonary Disease | Admitting: Pulmonary Disease

## 2015-09-24 ENCOUNTER — Telehealth: Payer: Self-pay | Admitting: Adult Health

## 2015-09-24 ENCOUNTER — Encounter: Payer: Self-pay | Admitting: Pulmonary Disease

## 2015-09-24 ENCOUNTER — Ambulatory Visit (INDEPENDENT_AMBULATORY_CARE_PROVIDER_SITE_OTHER): Payer: Medicare Other | Admitting: Pulmonary Disease

## 2015-09-24 ENCOUNTER — Ambulatory Visit: Payer: Medicare Other | Admitting: Cardiovascular Disease

## 2015-09-24 VITALS — BP 132/80 | HR 88 | Ht 70.0 in | Wt 284.0 lb

## 2015-09-24 DIAGNOSIS — J690 Pneumonitis due to inhalation of food and vomit: Secondary | ICD-10-CM

## 2015-09-24 DIAGNOSIS — I350 Nonrheumatic aortic (valve) stenosis: Secondary | ICD-10-CM

## 2015-09-24 DIAGNOSIS — E662 Morbid (severe) obesity with alveolar hypoventilation: Secondary | ICD-10-CM

## 2015-09-24 DIAGNOSIS — I272 Other secondary pulmonary hypertension: Secondary | ICD-10-CM

## 2015-09-24 DIAGNOSIS — IMO0002 Reserved for concepts with insufficient information to code with codable children: Secondary | ICD-10-CM

## 2015-09-24 DIAGNOSIS — J9611 Chronic respiratory failure with hypoxia: Secondary | ICD-10-CM

## 2015-09-24 NOTE — Telephone Encounter (Signed)
lmtcb X1 for Wesley [name] at [facility]

## 2015-09-24 NOTE — Progress Notes (Signed)
Current Outpatient Prescriptions on File Prior to Visit  Medication Sig  . albuterol (PROVENTIL HFA;VENTOLIN HFA) 108 (90 BASE) MCG/ACT inhaler Inhale 2 puffs into the lungs every 6 (six) hours as needed for wheezing or shortness of breath.  [name] albuterol (PROVENTIL) (2.5 MG/3ML) 0.083% nebulizer solution Take 2.5 mg by nebulization every 6 (six) hours as needed for wheezing or shortness of breath.   [name] amLODipine (NORVASC) 10 MG tablet Take 10 mg by mouth every morning.   [name] aspirin EC 81 MG tablet Take 81 mg by mouth every morning.  . carboxymethylcellulose (REFRESH) 1 % ophthalmic solution Apply 1 drop to eye 3 (three) times daily.  . cholecalciferol (VITAMIN D) 1000 UNITS tablet Take 1,000 Units by mouth daily.   [name] docusate sodium (COLACE) 100 MG capsule Take 100 mg by mouth daily.  [name] gabapentin (NEURONTIN) 300 MG capsule Take 300 mg by mouth at bedtime.   [name] galantamine (RAZADYNE ER) 16 MG 24 hr capsule Take 16 mg by mouth at bedtime.   [name] levothyroxine (SYNTHROID, LEVOTHROID) 125 MCG tablet Take 125 mcg by mouth daily before breakfast.  . Maltodextrin-Xanthan Gum (RESOURCE THICKENUP CLEAR) POWD Please dispense 1 box ( 1 month supply)  . metoprolol (LOPRESSOR) 100 MG tablet Take 100 mg by mouth 2 (two) times daily.  . pantoprazole (PROTONIX) 40 MG tablet Take 1 tablet (40 mg total) by mouth 2 (two) times daily.  . pravastatin (PRAVACHOL) 20 MG tablet Take 20 mg by mouth at bedtime.  [name] QUEtiapine (SEROQUEL) 100 MG tablet Take 100 mg by mouth at bedtime. Take with 25 mg tablet to equal a total dose of 125 mg  . QUEtiapine (SEROQUEL) 25 MG tablet Take 25 mg by mouth at bedtime. Take with 100mg  tablet to equal a total dose of 125 mg  . sertraline (ZOLOFT) 100 MG tablet Take 150 mg by mouth at bedtime.  . Tamsulosin HCl (FLOMAX) 0.4 MG CAPS Take 0.4 mg by mouth daily after breakfast. Once a day   No current facility-administered medications on file prior to visit.     Chief Complaint  Patient  presents with  . Acute Visit    Low O2 sats started inthe last day - pt is lethargic. Pt was seen in [facility] for Aspiration PNA. D/C'd home 09/08/15 - no change since d/c home.      Tests PSG 06/03/13 >> AHI 5, SpO2 low 82%, REM 16. 101 min with SpO2 < 88%. Needed 1 liter oxygen. ONO with RA 06/05/13 >> Test time 8 hrs 31 min. Mean SpO2 84%, low SpO2 59%. Spent 8 hrs 1 min with SpO2 < 88%. ONO with 2 liters 06/27/13 >> Test time 7 hrs 25 min. Basal SpO2 91.9%, low SpO2 76%. Spent 11 min with SpO2 < 88%. Echo 05/21/14 >> EF 55 to 60%, mod LVH, mod AS, PAS 60 mmHg Doppler legs 05/21/14 >>  V/Q scan 05/22/14 >> low probability for PE Echo 09/02/15 >> EF 60 to [id number], grade 2 diastolic dysfx, severe AS  Past medical hx Aortic stenosis, HTN, HLD, Achalasia, Hiatal hernia, PSTD, TBI, Hypothyroidism, stage IV CKD, Lt tonsil oropharyngeal cancer (squamous cell) 2011  Past surgical hx, Allergies, Family hx, Social hx all reviewed.  Vital Signs BP 132/80 mmHg  Pulse 88  Ht 5\' 10"  (1.778 m)  Wt 284 lb (128.822 kg)  BMI 40.75 kg/m2  SpO2 95%  History of Present Illness Wesley [name] is a 70 y.o. male with chronic respiratory failure 2nd to OSA/OHS and  recurrent aspiration pneumonia.  He also has hx of severe aortic stenosis.  He has been in hospital several times with pneumonia.  He was found to have recurrent aspiration.  He has been doing better since he has been home and had his diet changed to D3 diet.  He still has fluctuation with his oxygen >> uses 4 to 8 liters oxygen.  He is not having cough or wheeze.    He is getting IV iron for anemia, and has appt with hematology.   Physical Exam  General - sitting in wheelchair, wearing oxygen ENT - No sinus tenderness, no oral exudate, no LAN Cardiac - s1s2 regular, 3/6 SM Chest - decreased BS, no wheeze Back - No focal tenderness Abd - Soft, non-tender Ext - 2+ edema Neuro - Normal strength Skin - No rashes Psych - normal mood, and  behavior   Assessment/Plan  Chronic respiratory failure with hypoxia/hypercapnia for obesity hypoventilation syndrome. Plan: - continue 4 to 8 liters oxygen  Obstructive sleep apnea. 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 Plan: - advised about importance of weight loss  Dysphagia with recurrent aspiration pneumonia. Plan: - continue D3 diet - f/u CXR today  Anemia. Plan: - he is getting IV iron - he has appt with hematology scheduled  Aortic stenosis >> severe on Echo from December 2016. Plan: - f/u with cardiology >> he would likely be too high a risk for any type of intervention for this    Patient Instructions  Chest xray today  Follow up in 6 weeks with Dr. Halford Chessman or Tammy Parrett     [name], MD [place] Pager:  [phone]

## 2015-09-24 NOTE — Telephone Encounter (Signed)
Spoke with Beth at [facility], states that she visited pt today-states that pt is having to increase 02 to 8lpm to stay above 88%.  States pt has diminished lung sounds on R worse than L.  Pulse staying around 90.  Pt did not feel short of breath, respirations wnl.    Beth wants to know if pt will be ok to wait until appt tomorrow afternoon with TP.   Called pt, appt moved to VS' open appt at 4:30 today.  Nothing further needed.

## 2015-09-24 NOTE — Patient Instructions (Signed)
Chest xray today  Follow up in 6 weeks with Dr. Halford Chessman or Tammy Parrett

## 2015-09-25 ENCOUNTER — Telehealth: Payer: Self-pay | Admitting: Pulmonary Disease

## 2015-09-25 ENCOUNTER — Other Ambulatory Visit (HOSPITAL_COMMUNITY): Payer: Self-pay

## 2015-09-25 ENCOUNTER — Ambulatory Visit: Payer: Medicare Other | Admitting: Adult Health

## 2015-09-25 NOTE — Telephone Encounter (Signed)
Patient scheduled for 6 week ROV with Dr Halford Chessman at 10:30 on 11/05/15.  Will send to Dr Halford Chessman to advise on cxr results.

## 2015-09-25 NOTE — Telephone Encounter (Signed)
Dg Chest 2 View  09/25/2015  CLINICAL DATA:  Pneumonia.  Follow-up exam. EXAM: CHEST  2 VIEW COMPARISON:  09/03/2015. FINDINGS: Mediastinum hilar structures normal. Cardiomegaly with pulmonary vascular prominence and bilateral pulmonary alveolar infiltrates noted consistent congestive heart failure. Slight interim clearing from prior exam. Small left pleural effusion. No pneumothorax. IMPRESSION: Congestive heart failure with bilateral pulmonary edema and small left pleural effusion. Interim partial clearing. Electronically Signed   By: Marcello Moores  Register   On: 09/25/2015 08:37    Left message on wife's voicemail.  Explained that CXR looks better, but lung infiltrates from hospital have not cleared completely >> this does not mean he still has infection.  Also some expected changes from CHF and aortic stenosis.   Will have my nurse call to confirm receipt of message, and clarify any issues with scheduling follow up appointment.

## 2015-09-25 NOTE — Telephone Encounter (Signed)
Patient wife calling for cxr results. Please advise Dr Halford Chessman of cxr results if available. Thanks.

## 2015-09-25 NOTE — Telephone Encounter (Signed)
Spoke with patient wife - aware of results. States that she received the message regarding cxr results.  Scheduled to come back 11/05/15 with VS and will ask any questions at that time. Will send to VS as FYI. Nothing further needed.

## 2015-09-28 ENCOUNTER — Other Ambulatory Visit: Payer: Self-pay | Admitting: Hematology and Oncology

## 2015-09-28 ENCOUNTER — Encounter (HOSPITAL_COMMUNITY)
Admission: RE | Admit: 2015-09-28 | Discharge: 2015-09-28 | Disposition: A | Discharge status: Home / Self Care | Payer: Medicare Other | Source: Ambulatory Visit | Attending: Nephrology | Admitting: Nephrology

## 2015-09-28 DIAGNOSIS — D631 Anemia in chronic kidney disease: Secondary | ICD-10-CM

## 2015-09-28 DIAGNOSIS — D509 Iron deficiency anemia, unspecified: Secondary | ICD-10-CM | POA: Insufficient documentation

## 2015-09-28 DIAGNOSIS — N189 Chronic kidney disease, unspecified: Principal | ICD-10-CM

## 2015-09-28 DIAGNOSIS — C109 Malignant neoplasm of oropharynx, unspecified: Secondary | ICD-10-CM

## 2015-09-28 MED ORDER — FERUMOXYTOL INJECTION 510 MG/17 ML
510.0000 mg | INTRAVENOUS | Status: DC
Start: 2015-09-28 — End: 2015-09-29
  Administered 2015-09-28: 510 mg via INTRAVENOUS
  Filled 2015-09-28: qty 17

## 2015-09-29 ENCOUNTER — Telehealth: Payer: Self-pay | Admitting: Hematology and Oncology

## 2015-09-29 NOTE — Telephone Encounter (Signed)
Spoke with wife re lab/fu for 1/23 @ 11 am.

## 2015-09-30 ENCOUNTER — Encounter (HOSPITAL_COMMUNITY): Payer: Medicare Other

## 2015-09-30 ENCOUNTER — Other Ambulatory Visit (HOSPITAL_COMMUNITY): Payer: Self-pay | Admitting: *Deleted

## 2015-09-30 ENCOUNTER — Other Ambulatory Visit: Payer: Self-pay | Admitting: Hematology and Oncology

## 2015-09-30 ENCOUNTER — Other Ambulatory Visit: Payer: Self-pay | Admitting: *Deleted

## 2015-10-01 ENCOUNTER — Encounter (HOSPITAL_COMMUNITY)
Admission: RE | Admit: 2015-10-01 | Discharge: 2015-10-01 | Disposition: A | Discharge status: Home / Self Care | Payer: Medicare Other | Source: Ambulatory Visit | Attending: Nephrology | Admitting: Nephrology

## 2015-10-01 DIAGNOSIS — D509 Iron deficiency anemia, unspecified: Secondary | ICD-10-CM | POA: Diagnosis not present

## 2015-10-01 MED ORDER — SODIUM CHLORIDE 0.9 % IV SOLN
510.0000 mg | INTRAVENOUS | Status: AC
  Administered 2015-10-01: 510 mg via INTRAVENOUS
  Filled 2015-10-01: qty 17

## 2015-10-01 MED ORDER — SODIUM CHLORIDE 0.9 % IV SOLN
510.0000 mg | INTRAVENOUS | Status: DC
  Filled 2015-10-01: qty 17

## 2015-10-05 ENCOUNTER — Other Ambulatory Visit: Payer: Self-pay | Admitting: *Deleted

## 2015-10-05 ENCOUNTER — Telehealth: Payer: Self-pay | Admitting: Hematology and Oncology

## 2015-10-05 ENCOUNTER — Ambulatory Visit (HOSPITAL_BASED_OUTPATIENT_CLINIC_OR_DEPARTMENT_OTHER): Payer: Medicare Other | Admitting: Hematology and Oncology

## 2015-10-05 ENCOUNTER — Ambulatory Visit (HOSPITAL_BASED_OUTPATIENT_CLINIC_OR_DEPARTMENT_OTHER): Payer: Medicare Other

## 2015-10-05 ENCOUNTER — Encounter: Payer: Self-pay | Admitting: Hematology and Oncology

## 2015-10-05 ENCOUNTER — Ambulatory Visit (HOSPITAL_COMMUNITY)
Admission: RE | Admit: 2015-10-05 | Discharge: 2015-10-05 | Disposition: A | Discharge status: Home / Self Care | Payer: Medicare Other | Source: Ambulatory Visit | Attending: Hematology and Oncology | Admitting: Hematology and Oncology

## 2015-10-05 ENCOUNTER — Other Ambulatory Visit (HOSPITAL_BASED_OUTPATIENT_CLINIC_OR_DEPARTMENT_OTHER): Payer: Medicare Other

## 2015-10-05 VITALS — BP 128/73 | HR 104 | Temp 99.0°F | Resp 22

## 2015-10-05 VITALS — BP 143/94 | HR 81 | Temp 98.7°F | Resp 19 | Wt 277.0 lb

## 2015-10-05 DIAGNOSIS — D631 Anemia in chronic kidney disease: Secondary | ICD-10-CM

## 2015-10-05 DIAGNOSIS — N184 Chronic kidney disease, stage 4 (severe): Secondary | ICD-10-CM

## 2015-10-05 DIAGNOSIS — D638 Anemia in other chronic diseases classified elsewhere: Secondary | ICD-10-CM

## 2015-10-05 DIAGNOSIS — N189 Chronic kidney disease, unspecified: Secondary | ICD-10-CM

## 2015-10-05 DIAGNOSIS — Z85819 Personal history of malignant neoplasm of unspecified site of lip, oral cavity, and pharynx: Secondary | ICD-10-CM | POA: Diagnosis not present

## 2015-10-05 DIAGNOSIS — J9601 Acute respiratory failure with hypoxia: Secondary | ICD-10-CM | POA: Diagnosis not present

## 2015-10-05 DIAGNOSIS — C109 Malignant neoplasm of oropharynx, unspecified: Secondary | ICD-10-CM

## 2015-10-05 DIAGNOSIS — I10 Essential (primary) hypertension: Secondary | ICD-10-CM

## 2015-10-05 LAB — CBC & DIFF AND RETIC
BASO%: 0.3 % (ref 0.0–2.0)
Basophils Absolute: 0 10*3/uL (ref 0.0–0.1)
EOS%: 0.8 % (ref 0.0–7.0)
Eosinophils Absolute: 0.1 10*3/uL (ref 0.0–0.5)
HCT: 24.2 % — ABNORMAL LOW (ref 38.4–49.9)
HGB: 7.5 g/dL — ABNORMAL LOW (ref 13.0–17.1)
Immature Retic Fract: 17.4 % — ABNORMAL HIGH (ref 3.00–10.60)
LYMPH#: 0.9 10*3/uL (ref 0.9–3.3)
LYMPH%: 5.7 % — ABNORMAL LOW (ref 14.0–49.0)
MCH: 25.6 pg — ABNORMAL LOW (ref 27.2–33.4)
MCHC: 30.8 g/dL — AB (ref 32.0–36.0)
MCV: 83.3 fL (ref 79.3–98.0)
MONO#: 1.2 10*3/uL — ABNORMAL HIGH (ref 0.1–0.9)
MONO%: 7.4 % (ref 0.0–14.0)
NEUT#: 13.7 10*3/uL — ABNORMAL HIGH (ref 1.5–6.5)
NEUT%: 85.8 % — AB (ref 39.0–75.0)
PLATELETS: 204 10*3/uL (ref 140–400)
RBC: 2.91 10*6/uL — AB (ref 4.20–5.82)
RDW: 19.4 % — AB (ref 11.0–14.6)
RETIC CT ABS: 78.28 10*3/uL (ref 34.80–93.90)
Retic %: 2.69 % — ABNORMAL HIGH (ref 0.80–1.80)
WBC: 15.9 10*3/uL — AB (ref 4.0–10.3)

## 2015-10-05 LAB — LACTATE DEHYDROGENASE: LDH: 138 U/L (ref 125–245)

## 2015-10-05 LAB — COMPREHENSIVE METABOLIC PANEL
ALT: 14 U/L (ref 0–55)
ANION GAP: 11 meq/L (ref 3–11)
AST: 18 U/L (ref 5–34)
Albumin: 2.9 g/dL — ABNORMAL LOW (ref 3.5–5.0)
Alkaline Phosphatase: 109 U/L (ref 40–150)
BUN: 36.3 mg/dL — ABNORMAL HIGH (ref 7.0–26.0)
CHLORIDE: 95 meq/L — AB (ref 98–109)
CO2: 36 meq/L — AB (ref 22–29)
Calcium: 9.3 mg/dL (ref 8.4–10.4)
Creatinine: 2.9 mg/dL — ABNORMAL HIGH (ref 0.7–1.3)
EGFR: 21 mL/min/{1.73_m2} — AB (ref >90)
Glucose: 115 mg/dl (ref 70–140)
Potassium: 4.1 mEq/L (ref 3.5–5.1)
Sodium: 142 mEq/L (ref 136–145)
Total Bilirubin: 0.36 mg/dL (ref 0.20–1.20)
Total Protein: 7.5 g/dL (ref 6.4–8.3)

## 2015-10-05 LAB — IRON AND TIBC
%SAT: 24 % (ref 20–55)
IRON: 62 ug/dL (ref 42–163)
TIBC: 260 ug/dL (ref 202–409)
UIBC: 198 ug/dL (ref 117–376)

## 2015-10-05 LAB — ABO/RH: ABO/RH(D): O NEG

## 2015-10-05 LAB — PREPARE RBC (CROSSMATCH)

## 2015-10-05 LAB — FERRITIN: Ferritin: 563 ng/ml — ABNORMAL HIGH (ref 22–316)

## 2015-10-05 MED ORDER — ACETAMINOPHEN 325 MG PO TABS
ORAL_TABLET | ORAL | Status: AC
  Filled 2015-10-05: qty 2

## 2015-10-05 MED ORDER — DIPHENHYDRAMINE HCL 25 MG PO CAPS
ORAL_CAPSULE | ORAL | Status: AC
  Filled 2015-10-05: qty 1

## 2015-10-05 MED ORDER — DIPHENHYDRAMINE HCL 25 MG PO CAPS
25.0000 mg | ORAL_CAPSULE | Freq: Once | ORAL | Status: AC
  Administered 2015-10-05: 25 mg via ORAL

## 2015-10-05 MED ORDER — ACETAMINOPHEN 325 MG PO TABS
650.0000 mg | ORAL_TABLET | Freq: Once | ORAL | Status: AC
  Administered 2015-10-05: 650 mg via ORAL

## 2015-10-05 MED ORDER — SODIUM CHLORIDE 0.9 % IV SOLN
250.0000 mL | Freq: Once | INTRAVENOUS | Status: AC
  Administered 2015-10-05: 250 mL via INTRAVENOUS

## 2015-10-05 NOTE — Patient Instructions (Signed)
Darbepoetin Alfa injection What is this medicine? DARBEPOETIN ALFA (dar be POE e tin AL fa) helps your body make more red blood cells. It is used to treat anemia caused by chronic kidney failure and chemotherapy. This medicine may be used for other purposes; ask your health care provider or pharmacist if you have questions. What should I tell my health care provider before I take this medicine? They need to know if you have any of these conditions: -blood clotting disorders or history of blood clots -cancer patient not on chemotherapy -cystic fibrosis -heart disease, such as angina, heart failure, or a history of a heart attack -hemoglobin level of 12 g/dL or greater -high blood pressure -low levels of folate, iron, or vitamin B12 -seizures -an unusual or allergic reaction to darbepoetin, erythropoietin, albumin, hamster proteins, latex, other medicines, foods, dyes, or preservatives -pregnant or trying to get pregnant -breast-feeding How should I use this medicine? This medicine is for injection into a vein or under the skin. It is usually given by a health care professional in a hospital or clinic setting. If you get this medicine at home, you will be taught how to prepare and give this medicine. Do not shake the solution before you withdraw a dose. Use exactly as directed. Take your medicine at regular intervals. Do not take your medicine more often than directed. It is important that you put your used needles and syringes in a special sharps container. Do not put them in a trash can. If you do not have a sharps container, call your pharmacist or healthcare provider to get one. Talk to your pediatrician regarding the use of this medicine in children. While this medicine may be used in children as young as 1 year for selected conditions, precautions do apply. Overdosage: If you think you have taken too much of this medicine contact a poison control center or emergency room at once. NOTE:  This medicine is only for you. Do not share this medicine with others. What if I miss a dose? If you miss a dose, take it as soon as you can. If it is almost time for your next dose, take only that dose. Do not take double or extra doses. What may interact with this medicine? Do not take this medicine with any of the following medications: -epoetin alfa This list may not describe all possible interactions. Give your health care provider a list of all the medicines, herbs, non-prescription drugs, or dietary supplements you use. Also tell them if you smoke, drink alcohol, or use illegal drugs. Some items may interact with your medicine. What should I watch for while using this medicine? Visit your prescriber or health care professional for regular checks on your progress and for the needed blood tests and blood pressure measurements. It is especially important for the doctor to make sure your hemoglobin level is in the desired range, to limit the risk of potential side effects and to give you the best benefit. Keep all appointments for any recommended tests. Check your blood pressure as directed. Ask your doctor what your blood pressure should be and when you should contact him or her. As your body makes more red blood cells, you may need to take iron, folic acid, or vitamin B supplements. Ask your doctor or health care provider which products are right for you. If you have kidney disease continue dietary restrictions, even though this medication can make you feel better. Talk with your doctor or health care professional about the   foods you eat and the vitamins that you take. What side effects may I notice from receiving this medicine? Side effects that you should report to your doctor or health care professional as soon as possible: -allergic reactions like skin rash, itching or hives, swelling of the face, lips, or tongue -breathing problems -changes in vision -chest pain -confusion, trouble speaking  or understanding -feeling faint or lightheaded, falls -high blood pressure -muscle aches or pains -pain, swelling, warmth in the leg -rapid weight gain -severe headaches -sudden numbness or weakness of the face, arm or leg -trouble walking, dizziness, loss of balance or coordination -seizures (convulsions) -swelling of the ankles, feet, hands -unusually weak or tired Side effects that usually do not require medical attention (report to your doctor or health care professional if they continue or are bothersome): -diarrhea -fever, chills (flu-like symptoms) -headaches -nausea, vomiting -redness, stinging, or swelling at site where injected This list may not describe all possible side effects. Call your doctor for medical advice about side effects. You may report side effects to FDA at 1-800-FDA-1088. Where should I keep my medicine? Keep out of the reach of children. Store in a refrigerator between 2 and 8 degrees C (36 and 46 degrees F). Do not freeze. Do not shake. Throw away any unused portion if using a single-dose vial. Throw away any unused medicine after the expiration date. NOTE: This sheet is a summary. It may not cover all possible information. If you have questions about this medicine, talk to your doctor, pharmacist, or health care provider.    2016, Elsevier/Gold Standard. (2008-08-12 10:23:57)  

## 2015-10-05 NOTE — Progress Notes (Signed)
[name] OFFICE PROGRESS NOTE  Patient Care Team: Hulan Fess, MD as PCP - General (Family Medicine) Thea Silversmith, MD as Consulting Physician (Radiation Oncology) Melida Quitter, MD as Consulting Physician (Otolaryngology) Edrick Oh, MD (Nephrology) Heath Lark, MD as Consulting Physician (Hematology and Oncology)  SUMMARY OF ONCOLOGIC HISTORY: This patient had background history oropharyngeal cancer. He was last seen by myself in August 2015 and was discharged from the office. I reviewed the patient's records extensive and collaborated the history with the patient. Summary of his history is as follows: The patient's cancer was diagnosed after he felt a lump in the neck. 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 He was treated with concurrent chemoradiation with every 3 week cisplatin, daily radiation between 06/02/2010 and 06/23/2010. His treatment was complicated by a quiet hypothyroidism, dysphagia, persistent dry mouth, poor dentition and altered taste sensation. His last PET/CT scan in May 2012 showed no evidence of disease. Recently, he was noted to have severe anemia. He had recurrent hospitalizations over the past 6 months. With his last admission to the hospital in December 2016, he had severe pneumonia and respiratory failure. He was told subsequently that the cause was related to aspiration pneumonia and currently he is on thickened diet. He was referred back here because of severe anemia and to discuss the use of ASA.  INTERVAL HISTORY: Please see below for problem oriented charting. According to himself and family, he has significant shortness of breath at rest. The patient denies any recent signs or symptoms of bleeding such as spontaneous epistaxis, hematuria or hematochezia. He was prescribed IV iron over the last 2 weeks with minimum improvement of his symptoms. He had minimal  cough. He denies lymphadenopathy in his neck. He is able to tolerate thickened diet He denies further fever or chills since recent dismissal from the hospital  REVIEW OF SYSTEMS:   Eyes: Denies blurriness of vision Ears, nose, mouth, throat, and face: Denies mucositis or sore throat Cardiovascular: Denies palpitation, chest discomfort or lower extremity swelling Gastrointestinal:  Denies nausea, heartburn or change in bowel habits Skin: Denies abnormal skin rashes Neurological:Denies numbness, tingling or new weaknesses Behavioral/Psych: Mood is stable, no new changes  All other systems were reviewed with the patient and are negative.  I have reviewed the past medical history, past surgical history, social history and family history with the patient and they are unchanged from previous note.  ALLERGIES:  has No Known Allergies.  MEDICATIONS:  Current Outpatient Prescriptions  Medication Sig Dispense Refill  . albuterol (PROVENTIL HFA;VENTOLIN HFA) 108 (90 BASE) MCG/ACT inhaler Inhale 2 puffs into the lungs every 6 (six) hours as needed for wheezing or shortness of breath. 1 Inhaler 3  . albuterol (PROVENTIL) (2.5 MG/3ML) 0.083% nebulizer solution Take 2.5 mg by nebulization every 6 (six) hours as needed for wheezing or shortness of breath.     [name] amLODipine (NORVASC) 10 MG tablet Take 10 mg by mouth every morning.     [name] aspirin EC 81 MG tablet Take 81 mg by mouth every morning.    . carboxymethylcellulose (REFRESH) 1 % ophthalmic solution Apply 1 drop to eye 3 (three) times daily.    . cholecalciferol (VITAMIN D) 1000 UNITS tablet Take 1,000 Units by mouth daily.     [name] docusate sodium (COLACE) 100 MG capsule Take 100 mg by mouth daily.    [name] gabapentin (NEURONTIN) 300 MG capsule Take 300 mg  by mouth at bedtime.     [name] galantamine (RAZADYNE ER) 16 MG 24 hr capsule Take 16 mg by mouth at bedtime.     [name] levothyroxine (SYNTHROID, LEVOTHROID) 125 MCG tablet Take 125 mcg by mouth daily before  breakfast.    . Maltodextrin-Xanthan Gum (RESOURCE THICKENUP CLEAR) POWD Please dispense 1 box ( 1 month supply) 1 Can 1  . metoprolol (LOPRESSOR) 100 MG tablet Take 100 mg by mouth 2 (two) times daily.    . pantoprazole (PROTONIX) 40 MG tablet Take 1 tablet (40 mg total) by mouth 2 (two) times daily. 60 tablet 0  . pravastatin (PRAVACHOL) 20 MG tablet Take 20 mg by mouth at bedtime.    [name] QUEtiapine (SEROQUEL) 100 MG tablet Take 100 mg by mouth at bedtime. Take with 25 mg tablet to equal a total dose of 125 mg    . QUEtiapine (SEROQUEL) 25 MG tablet Take 25 mg by mouth at bedtime. Take with 100mg  tablet to equal a total dose of 125 mg    . sertraline (ZOLOFT) 100 MG tablet Take 150 mg by mouth at bedtime.    . Tamsulosin HCl (FLOMAX) 0.4 MG CAPS Take 0.4 mg by mouth daily after breakfast. Once a day     No current facility-administered medications for this visit.    PHYSICAL EXAMINATION: ECOG PERFORMANCE STATUS: 2 - Symptomatic, <50% confined to bed  Filed Vitals:   10/05/15 1120  BP: 143/94  Pulse: 81  Temp: 98.7 F (37.1 C)  Resp: 19   Filed Weights   10/05/15 1120  Weight: 277 lb (125.646 kg)    GENERAL:alert, with mild respiratory distress. He have oxygen concentrator in my office and appeared debilitated SKIN: skin color, texture, turgor are normal, no rashes or significant lesions. Extensive bruises are noted. He looks pale EYES: normal, Conjunctiva are pale and non-injected, sclera clear OROPHARYNX:no exudate, no erythema and lips, buccal mucosa, and tongue normal  NECK: Neck is mildly woody from prior radiation. LYMPH:  no palpable lymphadenopathy in the cervical, axillary or inguinal LUNGS: He has reduced breath sounds throughout without wheezes  HEART: regular rate & rhythm and no murmurs with mild bilateral lower extremity edema ABDOMEN:abdomen soft, non-tender and normal bowel sounds Musculoskeletal:no cyanosis of digits and no clubbing  NEURO: alert & oriented x 3  with fluent speech, no focal motor/sensory deficits  LABORATORY DATA:  I have reviewed the data as listed    Component Value Date/Time   NA 142 10/05/2015 1054   NA 142 09/07/2015 0438   K 4.1 10/05/2015 1054   K 4.8 09/07/2015 0438   CL 95* 09/07/2015 0438   CO2 36* 10/05/2015 1054   CO2 38* 09/07/2015 0438   GLUCOSE 115 10/05/2015 1054   GLUCOSE 93 09/07/2015 0438   BUN 36.3* 10/05/2015 1054   BUN 47* 09/07/2015 0438   CREATININE 2.9* 10/05/2015 1054   CREATININE 2.80* 09/07/2015 0438   CALCIUM 9.3 10/05/2015 1054   CALCIUM 8.9 09/07/2015 0438   PROT 7.5 10/05/2015 1054   PROT 6.2* 09/01/2015 2030   ALBUMIN 2.9* 10/05/2015 1054   ALBUMIN 2.9* 09/01/2015 2030   AST 18 10/05/2015 1054   AST 14* 09/01/2015 2030   ALT 14 10/05/2015 1054   ALT 20 09/01/2015 2030   ALKPHOS 109 10/05/2015 1054   ALKPHOS 73 09/01/2015 2030   BILITOT 0.36 10/05/2015 1054   BILITOT 0.4 09/01/2015 2030   GFRNONAA 21* 09/07/2015 0438   GFRAA 25* 09/07/2015 0438    No  results found for: SPEP, UPEP  Lab Results  Component Value Date   WBC 15.9* 10/05/2015   NEUTROABS 13.7* 10/05/2015   HGB 7.5* 10/05/2015   HCT 24.2* 10/05/2015   MCV 83.3 10/05/2015   PLT 204 10/05/2015      Chemistry      Component Value Date/Time   NA 142 10/05/2015 1054   NA 142 09/07/2015 0438   K 4.1 10/05/2015 1054   K 4.8 09/07/2015 0438   CL 95* 09/07/2015 0438   CO2 36* 10/05/2015 1054   CO2 38* 09/07/2015 0438   BUN 36.3* 10/05/2015 1054   BUN 47* 09/07/2015 0438   CREATININE 2.9* 10/05/2015 1054   CREATININE 2.80* 09/07/2015 0438      Component Value Date/Time   CALCIUM 9.3 10/05/2015 1054   CALCIUM 8.9 09/07/2015 0438   ALKPHOS 109 10/05/2015 1054   ALKPHOS 73 09/01/2015 2030   AST 18 10/05/2015 1054   AST 14* 09/01/2015 2030   ALT 14 10/05/2015 1054   ALT 20 09/01/2015 2030   BILITOT 0.36 10/05/2015 1054   BILITOT 0.4 09/01/2015 2030       RADIOGRAPHIC STUDIES: I reviewed his most recent  CT scan I have personally reviewed the radiological images as listed and agreed with the findings in the report.    ASSESSMENT & PLAN:  History of oropharyngeal cancer Clinically, he has no signs of disease recurrence. He had close follow-up with ENT last year with no evidence of recurrence. His last PET CT scan was in 2012 and he had multiple CT scan of the chest, abdomen and pelvis over the past few years which showed no evidence of disease recurrence. He does not need any further medical oncology follow-up for his history of oropharyngeal cancer and he will continue ENT follow-up as needed   Anemia in chronic renal disease He has severe anemia of chronic kidney disease, exacerbated by recent hospitalization for pneumonia. He has seen nephrologist recently who agreed that he should probably be receiving erythropoietin stimulating agents. We discussed some of the risks, benefits, and alternatives of erythropoietin stimulating agents such as Procrit or Aranesp. The patient is symptomatic from anemia and the EPO level is low. Some of the side-effects to be expected including risks of allergic reactions, skin rashes, headaches, risk of blood clots including heart attack and stroke. There is rare risks of causing growth of cancers.The patient is willing to proceed . I will saw him on Aranesp 200 g every 2 weeks and will continue to monitor his blood counts carefully. If his hemoglobin is less than 8 g, I will proceed to transfuse 1 unit of blood. Today, he is symptomatic with anemia. We discussed some of the risks, benefits, and alternatives of blood transfusions. The patient is symptomatic from anemia and the hemoglobin level is critically low.  Some of the side-effects to be expected including risks of transfusion reactions, chills, infection, syndrome of volume overload and risk of hospitalization from various reasons and the patient is willing to proceed with 1 unit of blood today  Essential  hypertension he will continue current medical management. I recommend close follow-up with primary care doctor for medication adjustment.   Acute respiratory failure with hypoxia (HCC) This is multifactorial, and the background history of COPD, pulmonary hypertension, aortic stenosis and recent pneumonia. The patient is receiving oxygen via concentrator in my office and appeared to be dyspneic. I suspect the shortness of breath is worse because of severe anemia and he agrees for blood  transfusion as above  Chronic kidney disease (CKD), stage IV (severe) ([place]) he will continue current medical management and risk factor modifications I recommend close follow-up with primary care doctor and nephrologist for medication adjustment.      Orders Placed This Encounter  Procedures  . Vitamin B12    Standing Status: Future     Number of Occurrences: 1     Standing Expiration Date: 11/08/2016  . CBC & Diff and Retic    Standing Status: Standing     Number of Occurrences: 33     Standing Expiration Date: 10/04/2016  . Hold Tube, Blood Bank    Standing Status: Standing     Number of Occurrences: 3     Standing Expiration Date: 10/04/2016   All questions were answered. The patient knows to call the clinic with any problems, questions or concerns. No barriers to learning was detected. I spent 30 minutes counseling the patient face to face. The total time spent in the appointment was 40 minutes and more than 50% was on counseling and review of test results      [facility], [place], MD 10/05/2015 4:18 PM

## 2015-10-05 NOTE — Telephone Encounter (Signed)
Pt confirmed labs/ov per 01/23 POF, gave pt AVS and Calendar.[name] KJ, NURSE ADDED BLOOD TO SCHEDULE FOR THIS DAY

## 2015-10-05 NOTE — Patient Instructions (Signed)

## 2015-10-06 LAB — TYPE AND SCREEN
ABO/RH(D): O NEG
Antibody Screen: NEGATIVE
UNIT DIVISION: 0

## 2015-10-06 LAB — ERYTHROPOIETIN: ERYTHROPOIETIN: 18.7 m[IU]/mL — AB (ref 2.6–18.5)

## 2015-10-06 LAB — VITAMIN B12: Vitamin B12: 574 pg/mL (ref 211–946)

## 2015-10-06 NOTE — Assessment & Plan Note (Signed)
He has severe anemia of chronic kidney disease, exacerbated by recent hospitalization for pneumonia. He has seen nephrologist recently who agreed that he should probably be receiving erythropoietin stimulating agents. We discussed some of the risks, benefits, and alternatives of erythropoietin stimulating agents such as Procrit or Aranesp. The patient is symptomatic from anemia and the EPO level is low. Some of the side-effects to be expected including risks of allergic reactions, skin rashes, headaches, risk of blood clots including heart attack and stroke. There is rare risks of causing growth of cancers.The patient is willing to proceed . I will saw him on Aranesp 200 g every 2 weeks and will continue to monitor his blood counts carefully. If his hemoglobin is less than 8 g, I will proceed to transfuse 1 unit of blood. Today, he is symptomatic with anemia. We discussed some of the risks, benefits, and alternatives of blood transfusions. The patient is symptomatic from anemia and the hemoglobin level is critically low.  Some of the side-effects to be expected including risks of transfusion reactions, chills, infection, syndrome of volume overload and risk of hospitalization from various reasons and the patient is willing to proceed with 1 unit of blood today

## 2015-10-06 NOTE — Assessment & Plan Note (Signed)
he will continue current medical management and risk factor modifications I recommend close follow-up with primary care doctor and nephrologist for medication adjustment.

## 2015-10-06 NOTE — Assessment & Plan Note (Signed)
This is multifactorial, and the background history of COPD, pulmonary hypertension, aortic stenosis and recent pneumonia. The patient is receiving oxygen via concentrator in my office and appeared to be dyspneic. I suspect the shortness of breath is worse because of severe anemia and he agrees for blood transfusion as above

## 2015-10-06 NOTE — Assessment & Plan Note (Signed)
he will continue current medical management. I recommend close follow-up with primary care doctor for medication adjustment.  

## 2015-10-06 NOTE — Assessment & Plan Note (Signed)
Clinically, he has no signs of disease recurrence. He had close follow-up with ENT last year with no evidence of recurrence. His last PET CT scan was in 2012 and he had multiple CT scan of the chest, abdomen and pelvis over the past few years which showed no evidence of disease recurrence. He does not need any further medical oncology follow-up for his history of oropharyngeal cancer and he will continue ENT follow-up as needed

## 2015-10-12 ENCOUNTER — Ambulatory Visit (HOSPITAL_BASED_OUTPATIENT_CLINIC_OR_DEPARTMENT_OTHER): Payer: Medicare Other

## 2015-10-12 ENCOUNTER — Other Ambulatory Visit (HOSPITAL_BASED_OUTPATIENT_CLINIC_OR_DEPARTMENT_OTHER): Payer: Medicare Other

## 2015-10-12 ENCOUNTER — Other Ambulatory Visit: Payer: Self-pay | Admitting: Hematology and Oncology

## 2015-10-12 ENCOUNTER — Ambulatory Visit (HOSPITAL_COMMUNITY)
Admission: RE | Admit: 2015-10-12 | Discharge: 2015-10-12 | Disposition: A | Discharge status: Home / Self Care | Payer: Medicare Other | Source: Ambulatory Visit | Attending: Hematology and Oncology | Admitting: Hematology and Oncology

## 2015-10-12 VITALS — BP 123/64 | HR 77 | Temp 98.2°F | Resp 20

## 2015-10-12 VITALS — BP 153/83 | HR 75 | Temp 98.0°F | Resp 20

## 2015-10-12 DIAGNOSIS — D631 Anemia in chronic kidney disease: Secondary | ICD-10-CM

## 2015-10-12 DIAGNOSIS — D638 Anemia in other chronic diseases classified elsewhere: Secondary | ICD-10-CM

## 2015-10-12 DIAGNOSIS — N189 Chronic kidney disease, unspecified: Principal | ICD-10-CM

## 2015-10-12 DIAGNOSIS — N184 Chronic kidney disease, stage 4 (severe): Secondary | ICD-10-CM

## 2015-10-12 LAB — CBC & DIFF AND RETIC
BASO%: 0.7 % (ref 0.0–2.0)
BASOS ABS: 0.1 10*3/uL (ref 0.0–0.1)
EOS ABS: 0.3 10*3/uL (ref 0.0–0.5)
EOS%: 3.1 % (ref 0.0–7.0)
HEMATOCRIT: 27.3 % — AB (ref 38.4–49.9)
HEMOGLOBIN: 7.9 g/dL — AB (ref 13.0–17.1)
Immature Retic Fract: 12.2 % — ABNORMAL HIGH (ref 3.00–10.60)
LYMPH%: 10.1 % — ABNORMAL LOW (ref 14.0–49.0)
MCH: 26.2 pg — ABNORMAL LOW (ref 27.2–33.4)
MCHC: 28.9 g/dL — AB (ref 32.0–36.0)
MCV: 90.4 fL (ref 79.3–98.0)
MONO#: 0.6 10*3/uL (ref 0.1–0.9)
MONO%: 7.1 % (ref 0.0–14.0)
NEUT#: 6.9 10*3/uL — ABNORMAL HIGH (ref 1.5–6.5)
NEUT%: 79 % — ABNORMAL HIGH (ref 39.0–75.0)
Platelets: 237 10*3/uL (ref 140–400)
RBC: 3.02 10*6/uL — ABNORMAL LOW (ref 4.20–5.82)
RDW: 17.3 % — AB (ref 11.0–14.6)
RETIC %: 2.47 % — AB (ref 0.80–1.80)
Retic Ct Abs: 74.59 10*3/uL (ref 34.80–93.90)
WBC: 8.7 10*3/uL (ref 4.0–10.3)
lymph#: 0.9 10*3/uL (ref 0.9–3.3)

## 2015-10-12 LAB — PREPARE RBC (CROSSMATCH)

## 2015-10-12 MED ORDER — DARBEPOETIN ALFA 200 MCG/0.4ML IJ SOSY
200.0000 ug | PREFILLED_SYRINGE | Freq: Once | INTRAMUSCULAR | Status: AC
  Administered 2015-10-12: 200 ug via SUBCUTANEOUS
  Filled 2015-10-12: qty 0.4

## 2015-10-12 MED ORDER — SODIUM CHLORIDE 0.9% FLUSH: 10.0000 mL | INTRAVENOUS | Status: DC | PRN

## 2015-10-12 MED ORDER — HEPARIN SOD (PORK) LOCK FLUSH 100 UNIT/ML IV SOLN: 250.0000 [IU] | INTRAVENOUS | Status: DC | PRN

## 2015-10-12 MED ORDER — SODIUM CHLORIDE 0.9% FLUSH: 3.0000 mL | INTRAVENOUS | Status: DC | PRN

## 2015-10-12 MED ORDER — DIPHENHYDRAMINE HCL 25 MG PO CAPS
25.0000 mg | ORAL_CAPSULE | Freq: Once | ORAL | Status: AC
  Administered 2015-10-12: 25 mg via ORAL
  Filled 2015-10-12: qty 1

## 2015-10-12 MED ORDER — SODIUM CHLORIDE 0.9 % IV SOLN
250.0000 mL | Freq: Once | INTRAVENOUS | Status: AC
  Administered 2015-10-12: 250 mL via INTRAVENOUS

## 2015-10-12 MED ORDER — HEPARIN SOD (PORK) LOCK FLUSH 100 UNIT/ML IV SOLN: 500.0000 [IU] | Freq: Every day | INTRAVENOUS | Status: DC | PRN

## 2015-10-12 MED ORDER — ACETAMINOPHEN 325 MG PO TABS
650.0000 mg | ORAL_TABLET | Freq: Once | ORAL | Status: AC
  Administered 2015-10-12: 650 mg via ORAL
  Filled 2015-10-12: qty 2

## 2015-10-12 NOTE — Progress Notes (Signed)
Lakin here for lab and Aranesp injection.  HGB 7.9 today   States that he is extremely tired and does get SOB with any activity.   Wears O2 on continually.   Feels like he needs a blood transfusion today.  Orders placed for transfusion per Dr Alvy Bimler.

## 2015-10-13 LAB — TYPE AND SCREEN
ABO/RH(D): O NEG
Antibody Screen: NEGATIVE
Unit division: 0

## 2015-10-15 ENCOUNTER — Telehealth: Payer: Self-pay | Admitting: *Deleted

## 2015-10-15 NOTE — Telephone Encounter (Signed)
Call from Jan at Dr. Eddie Dibbles office. He received office note from Dr. Alvy Bimler and is asking how often we are checking pt's CBC and when does Dr. Rex Kras need to f/u w/ pt?  Called back and left VM informing we are checking labs every 2 weeks w/ aranesp injection.  Pt should continue to follow w/ Dr. Rex Kras for his HTN and other medical problems as Dr. Rex Kras feels necessary.  Informed her important for blood pressure to be well controlled while on Aranesp to decrease risk of stroke.  Please call back if any other questions.

## 2015-10-26 ENCOUNTER — Other Ambulatory Visit (HOSPITAL_BASED_OUTPATIENT_CLINIC_OR_DEPARTMENT_OTHER): Payer: Medicare Other

## 2015-10-26 ENCOUNTER — Ambulatory Visit (HOSPITAL_BASED_OUTPATIENT_CLINIC_OR_DEPARTMENT_OTHER): Payer: Medicare Other

## 2015-10-26 VITALS — BP 126/76 | HR 89 | Temp 97.9°F | Resp 20

## 2015-10-26 DIAGNOSIS — N189 Chronic kidney disease, unspecified: Secondary | ICD-10-CM

## 2015-10-26 DIAGNOSIS — D631 Anemia in chronic kidney disease: Secondary | ICD-10-CM | POA: Diagnosis not present

## 2015-10-26 DIAGNOSIS — N184 Chronic kidney disease, stage 4 (severe): Secondary | ICD-10-CM | POA: Diagnosis not present

## 2015-10-26 DIAGNOSIS — D638 Anemia in other chronic diseases classified elsewhere: Secondary | ICD-10-CM

## 2015-10-26 LAB — CBC & DIFF AND RETIC
BASO%: 0.5 % (ref 0.0–2.0)
BASOS ABS: 0 10*3/uL (ref 0.0–0.1)
EOS%: 2.8 % (ref 0.0–7.0)
Eosinophils Absolute: 0.2 10*3/uL (ref 0.0–0.5)
HEMATOCRIT: 33.8 % — AB (ref 38.4–49.9)
HGB: 10 g/dL — ABNORMAL LOW (ref 13.0–17.1)
Immature Retic Fract: 15.9 % — ABNORMAL HIGH (ref 3.00–10.60)
LYMPH#: 1.1 10*3/uL (ref 0.9–3.3)
LYMPH%: 13.3 % — ABNORMAL LOW (ref 14.0–49.0)
MCH: 26.8 pg — AB (ref 27.2–33.4)
MCHC: 29.6 g/dL — AB (ref 32.0–36.0)
MCV: 90.6 fL (ref 79.3–98.0)
MONO#: 0.8 10*3/uL (ref 0.1–0.9)
MONO%: 9.1 % (ref 0.0–14.0)
NEUT%: 74.3 % (ref 39.0–75.0)
NEUTROS ABS: 6.3 10*3/uL (ref 1.5–6.5)
Platelets: 255 10*3/uL (ref 140–400)
RBC: 3.73 10*6/uL — AB (ref 4.20–5.82)
RDW: 16.9 % — AB (ref 11.0–14.6)
RETIC %: 2.58 % — AB (ref 0.80–1.80)
RETIC CT ABS: 96.23 10*3/uL — AB (ref 34.80–93.90)
WBC: 8.5 10*3/uL (ref 4.0–10.3)

## 2015-10-26 MED ORDER — DARBEPOETIN ALFA 200 MCG/0.4ML IJ SOSY
200.0000 ug | PREFILLED_SYRINGE | Freq: Once | INTRAMUSCULAR | Status: AC
  Administered 2015-10-26: 200 ug via SUBCUTANEOUS
  Filled 2015-10-26: qty 0.4

## 2015-10-26 NOTE — Patient Instructions (Signed)
Darbepoetin Alfa injection What is this medicine? DARBEPOETIN ALFA (dar be POE e tin AL fa) helps your body make more red blood cells. It is used to treat anemia caused by chronic kidney failure and chemotherapy. This medicine may be used for other purposes; ask your health care provider or pharmacist if you have questions. What should I tell my health care provider before I take this medicine? They need to know if you have any of these conditions: -blood clotting disorders or history of blood clots -cancer patient not on chemotherapy -cystic fibrosis -heart disease, such as angina, heart failure, or a history of a heart attack -hemoglobin level of 12 g/dL or greater -high blood pressure -low levels of folate, iron, or vitamin B12 -seizures -an unusual or allergic reaction to darbepoetin, erythropoietin, albumin, hamster proteins, latex, other medicines, foods, dyes, or preservatives -pregnant or trying to get pregnant -breast-feeding How should I use this medicine? This medicine is for injection into a vein or under the skin. It is usually given by a health care professional in a hospital or clinic setting. If you get this medicine at home, you will be taught how to prepare and give this medicine. Do not shake the solution before you withdraw a dose. Use exactly as directed. Take your medicine at regular intervals. Do not take your medicine more often than directed. It is important that you put your used needles and syringes in a special sharps container. Do not put them in a trash can. If you do not have a sharps container, call your pharmacist or healthcare provider to get one. Talk to your pediatrician regarding the use of this medicine in children. While this medicine may be used in children as young as 1 year for selected conditions, precautions do apply. Overdosage: If you think you have taken too much of this medicine contact a poison control center or emergency room at once. NOTE:  This medicine is only for you. Do not share this medicine with others. What if I miss a dose? If you miss a dose, take it as soon as you can. If it is almost time for your next dose, take only that dose. Do not take double or extra doses. What may interact with this medicine? Do not take this medicine with any of the following medications: -epoetin alfa This list may not describe all possible interactions. Give your health care provider a list of all the medicines, herbs, non-prescription drugs, or dietary supplements you use. Also tell them if you smoke, drink alcohol, or use illegal drugs. Some items may interact with your medicine. What should I watch for while using this medicine? Visit your prescriber or health care professional for regular checks on your progress and for the needed blood tests and blood pressure measurements. It is especially important for the doctor to make sure your hemoglobin level is in the desired range, to limit the risk of potential side effects and to give you the best benefit. Keep all appointments for any recommended tests. Check your blood pressure as directed. Ask your doctor what your blood pressure should be and when you should contact him or her. As your body makes more red blood cells, you may need to take iron, folic acid, or vitamin B supplements. Ask your doctor or health care provider which products are right for you. If you have kidney disease continue dietary restrictions, even though this medication can make you feel better. Talk with your doctor or health care professional about the   foods you eat and the vitamins that you take. What side effects may I notice from receiving this medicine? Side effects that you should report to your doctor or health care professional as soon as possible: -allergic reactions like skin rash, itching or hives, swelling of the face, lips, or tongue -breathing problems -changes in vision -chest pain -confusion, trouble speaking  or understanding -feeling faint or lightheaded, falls -high blood pressure -muscle aches or pains -pain, swelling, warmth in the leg -rapid weight gain -severe headaches -sudden numbness or weakness of the face, arm or leg -trouble walking, dizziness, loss of balance or coordination -seizures (convulsions) -swelling of the ankles, feet, hands -unusually weak or tired Side effects that usually do not require medical attention (report to your doctor or health care professional if they continue or are bothersome): -diarrhea -fever, chills (flu-like symptoms) -headaches -nausea, vomiting -redness, stinging, or swelling at site where injected This list may not describe all possible side effects. Call your doctor for medical advice about side effects. You may report side effects to FDA at 1-800-FDA-1088. Where should I keep my medicine? Keep out of the reach of children. Store in a refrigerator between 2 and 8 degrees C (36 and 46 degrees F). Do not freeze. Do not shake. Throw away any unused portion if using a single-dose vial. Throw away any unused medicine after the expiration date. NOTE: This sheet is a summary. It may not cover all possible information. If you have questions about this medicine, talk to your doctor, pharmacist, or health care provider.    2016, Elsevier/Gold Standard. (2008-08-12 10:23:57)  

## 2015-11-05 ENCOUNTER — Ambulatory Visit (INDEPENDENT_AMBULATORY_CARE_PROVIDER_SITE_OTHER)
Admission: RE | Admit: 2015-11-05 | Discharge: 2015-11-05 | Disposition: A | Discharge status: Home / Self Care | Payer: Medicare Other | Source: Ambulatory Visit | Attending: Pulmonary Disease | Admitting: Pulmonary Disease

## 2015-11-05 ENCOUNTER — Encounter: Payer: Self-pay | Admitting: Pulmonary Disease

## 2015-11-05 ENCOUNTER — Ambulatory Visit (INDEPENDENT_AMBULATORY_CARE_PROVIDER_SITE_OTHER): Payer: Medicare Other | Admitting: Pulmonary Disease

## 2015-11-05 VITALS — BP 126/78 | HR 59 | Ht 70.0 in | Wt 270.0 lb

## 2015-11-05 DIAGNOSIS — J690 Pneumonitis due to inhalation of food and vomit: Secondary | ICD-10-CM | POA: Diagnosis not present

## 2015-11-05 DIAGNOSIS — J9611 Chronic respiratory failure with hypoxia: Secondary | ICD-10-CM

## 2015-11-05 DIAGNOSIS — E662 Morbid (severe) obesity with alveolar hypoventilation: Secondary | ICD-10-CM | POA: Diagnosis not present

## 2015-11-05 NOTE — Progress Notes (Signed)
Current Outpatient Prescriptions on File Prior to Visit  Medication Sig  . albuterol (PROVENTIL HFA;VENTOLIN HFA) 108 (90 BASE) MCG/ACT inhaler Inhale 2 puffs into the lungs every 6 (six) hours as needed for wheezing or shortness of breath.  [name] albuterol (PROVENTIL) (2.5 MG/3ML) 0.083% nebulizer solution Take 2.5 mg by nebulization every 6 (six) hours as needed for wheezing or shortness of breath.   [name] amLODipine (NORVASC) 10 MG tablet Take 10 mg by mouth every morning.   [name] aspirin EC 81 MG tablet Take 81 mg by mouth every morning.  . carboxymethylcellulose (REFRESH) 1 % ophthalmic solution Apply 1 drop to eye 3 (three) times daily.  . cholecalciferol (VITAMIN D) 1000 UNITS tablet Take 1,000 Units by mouth daily.   [name] docusate sodium (COLACE) 100 MG capsule Take 100 mg by mouth daily.  [name] gabapentin (NEURONTIN) 300 MG capsule Take 300 mg by mouth at bedtime.   [name] galantamine (RAZADYNE ER) 16 MG 24 hr capsule Take 16 mg by mouth at bedtime.   [name] levothyroxine (SYNTHROID, LEVOTHROID) 125 MCG tablet Take 125 mcg by mouth daily before breakfast.  . Maltodextrin-Xanthan Gum (RESOURCE THICKENUP CLEAR) POWD Please dispense 1 box ( 1 month supply)  . metoprolol (LOPRESSOR) 100 MG tablet Take 100 mg by mouth 2 (two) times daily.  . pantoprazole (PROTONIX) 40 MG tablet Take 1 tablet (40 mg total) by mouth 2 (two) times daily.  . pravastatin (PRAVACHOL) 20 MG tablet Take 20 mg by mouth at bedtime.  [name] QUEtiapine (SEROQUEL) 100 MG tablet Take 100 mg by mouth at bedtime. Take with 25 mg tablet to equal a total dose of 125 mg  . QUEtiapine (SEROQUEL) 25 MG tablet Take 25 mg by mouth at bedtime. Take with 100mg  tablet to equal a total dose of 125 mg  . sertraline (ZOLOFT) 100 MG tablet Take 150 mg by mouth at bedtime.  . Tamsulosin HCl (FLOMAX) 0.4 MG CAPS Take 0.4 mg by mouth daily after breakfast. Once a day   No current facility-administered medications on file prior to visit.     Chief Complaint  Patient  presents with  . Follow-up    Pt reports improvement since las OV. Wife notes much change as well in the patient's breathing. Denies any increased SOB. Not having to titrate O2 as much as before. Pt has lost 15lbs!     Tests PSG 06/03/13 >> AHI 5, SpO2 low 82%, REM 16. 101 min with SpO2 < 88%. Needed 1 liter oxygen. ONO with RA 06/05/13 >> Test time 8 hrs 31 min. Mean SpO2 84%, low SpO2 59%. Spent 8 hrs 1 min with SpO2 < 88%. ONO with 2 liters 06/27/13 >> Test time 7 hrs 25 min. Basal SpO2 91.9%, low SpO2 76%. Spent 11 min with SpO2 < 88%. Echo 05/21/14 >> EF 55 to 60%, mod LVH, mod AS, PAS 60 mmHg Doppler legs 05/21/14 >>  V/Q scan 05/22/14 >> low probability for PE Echo 09/02/15 >> EF 60 to [id number], grade 2 diastolic dysfx, severe AS  Past medical hx Aortic stenosis, HTN, HLD, Achalasia, Hiatal hernia, PSTD, TBI, Hypothyroidism, stage IV CKD, Lt tonsil oropharyngeal cancer (squamous cell) 2011  Past surgical hx, Allergies, Family hx, Social hx all reviewed.  Vital Signs BP 126/78 mmHg  Pulse 59  Ht 5\' 10"  (1.778 m)  Wt 270 lb (122.471 kg)  BMI 38.74 kg/m2  SpO2 93%  History of Present Illness Wesley [name] is a 70 y.o. male with chronic respiratory failure 2nd  to OSA/OHS and recurrent aspiration pneumonia.  He also has hx of severe aortic stenosis.  He has been feeling better.  He was seen by hematology, and Hb improved with tx.  He has lost 14 lbs since January.  His leg swelling is better.  He is not having cough, wheeze, or sputum.  He is down to 3 to 4 liters oxygen.  Physical Exam  General - sitting in wheelchair, wearing oxygen ENT - No sinus tenderness, no oral exudate, no LAN Cardiac - s1s2 regular, 3/6 SM Chest - decreased BS, no wheeze Back - No focal tenderness Abd - Soft, non-tender Ext - 2+ edema Neuro - Normal strength Skin - No rashes Psych - normal mood, and behavior   Wt Readings from Last 5 Encounters:  11/05/15 270 lb (122.471 kg)  10/05/15 277 lb  (125.646 kg)  10/01/15 270 lb (122.471 kg)  09/28/15 270 lb (122.471 kg)  09/24/15 284 lb (128.822 kg)     Assessment/Plan  Chronic respiratory failure with hypoxia/hypercapnia for obesity hypoventilation syndrome. Plan: - continue 3 to 4 liters oxygen  Obstructive sleep apnea. 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 Plan: - continue weight loss efforts  Dysphagia with recurrent aspiration pneumonia. Plan: - continue D3 diet - f/u CXR today  Anemia. Plan: - f/u with hematology  Aortic stenosis >> severe on Echo from December 2016. Plan: - f/u with cardiology >> he would likely be too high a risk for any type of intervention for this    Patient Instructions  Chest xray today  Follow up in 4 months     [name], MD [facility] Pager:  [phone]

## 2015-11-05 NOTE — Patient Instructions (Signed)
Chest xray today  Follow up in 4 months 

## 2015-11-09 ENCOUNTER — Ambulatory Visit: Payer: Medicare Other

## 2015-11-09 ENCOUNTER — Telehealth: Payer: Self-pay | Admitting: Pulmonary Disease

## 2015-11-09 ENCOUNTER — Other Ambulatory Visit: Payer: Self-pay | Admitting: Hematology and Oncology

## 2015-11-09 ENCOUNTER — Other Ambulatory Visit (HOSPITAL_BASED_OUTPATIENT_CLINIC_OR_DEPARTMENT_OTHER): Payer: Medicare Other

## 2015-11-09 DIAGNOSIS — D631 Anemia in chronic kidney disease: Secondary | ICD-10-CM

## 2015-11-09 DIAGNOSIS — N189 Chronic kidney disease, unspecified: Secondary | ICD-10-CM

## 2015-11-09 DIAGNOSIS — N184 Chronic kidney disease, stage 4 (severe): Secondary | ICD-10-CM | POA: Diagnosis not present

## 2015-11-09 DIAGNOSIS — D638 Anemia in other chronic diseases classified elsewhere: Secondary | ICD-10-CM

## 2015-11-09 LAB — CBC & DIFF AND RETIC
BASO%: 0.4 % (ref 0.0–2.0)
BASOS ABS: 0 10*3/uL (ref 0.0–0.1)
EOS ABS: 0.2 10*3/uL (ref 0.0–0.5)
EOS%: 2.9 % (ref 0.0–7.0)
HEMATOCRIT: 37.3 % — AB (ref 38.4–49.9)
HEMOGLOBIN: 11.3 g/dL — AB (ref 13.0–17.1)
IMMATURE RETIC FRACT: 9.7 % (ref 3.00–10.60)
LYMPH%: 14.8 % (ref 14.0–49.0)
MCH: 27.4 pg (ref 27.2–33.4)
MCHC: 30.3 g/dL — ABNORMAL LOW (ref 32.0–36.0)
MCV: 90.5 fL (ref 79.3–98.0)
MONO#: 0.6 10*3/uL (ref 0.1–0.9)
MONO%: 8.2 % (ref 0.0–14.0)
NEUT#: 5 10*3/uL (ref 1.5–6.5)
NEUT%: 73.7 % (ref 39.0–75.0)
Platelets: 221 10*3/uL (ref 140–400)
RBC: 4.12 10*6/uL — ABNORMAL LOW (ref 4.20–5.82)
RDW: 16.5 % — AB (ref 11.0–14.6)
RETIC %: 1.6 % (ref 0.80–1.80)
Retic Ct Abs: 65.92 10*3/uL (ref 34.80–93.90)
WBC: 6.8 10*3/uL (ref 4.0–10.3)
lymph#: 1 10*3/uL (ref 0.9–3.3)

## 2015-11-09 MED ORDER — DARBEPOETIN ALFA 200 MCG/0.4ML IJ SOSY: 200.0000 ug | PREFILLED_SYRINGE | Freq: Once | INTRAMUSCULAR | Status: DC

## 2015-11-09 NOTE — Telephone Encounter (Signed)
Dg Chest 2 View  11/05/2015  CLINICAL DATA:  Followup pneumonia EXAM: CHEST  2 VIEW COMPARISON:  09/24/2015 and 09/03/2015 FINDINGS: Cardiomediastinal silhouette is stable. There is persistent residual interstitial prominence and patchy airspace disease bilateral lower lobe. New patchy airspace opacities in left upper lobe. Findings are suspicious for recurrent pneumonia or less likely asymmetric interstitial edema. Clinical correlation is necessary. IMPRESSION: There is persistent residual interstitial prominence and patchy airspace disease bilateral lower lobe. New patchy airspace opacities in left upper lobe. Findings are suspicious for recurrent pneumonia or less likely asymmetric interstitial edema. Clinical correlation is necessary Electronically Signed   By: Lahoma Crocker M.D.   On: 11/05/2015 15:12    Left message on pt's voicemail.  Explained that he has more haziness in Lt upper lung.  Clinical suspicion for recurrent pneumonia was rather low at time of evaluation on 11/05/15.  CXR findings more likely related to interstitial edema in setting of aortic stenosis.  Advised him to call back with questions.  Will route message to my nurse to f/u with pt to confirm receipt of message.

## 2015-11-11 NOTE — Telephone Encounter (Signed)
Spoke with pt wife, aware of rec's per VS Nothing further needed.

## 2015-11-11 NOTE — Telephone Encounter (Signed)
Spoke with patient wife, states that they did receive the message.  Pt wife is asking about "the issue with the left lung" -- is there anything to be concerned with or treated in the left lung? Is follow up CT scan needed?  Please advise Dr Halford Chessman. Thanks.

## 2015-11-11 NOTE — Telephone Encounter (Signed)
Doesn't need f/u CT chest unless his symptoms get worse.

## 2015-11-23 ENCOUNTER — Ambulatory Visit: Payer: Medicare Other

## 2015-11-23 ENCOUNTER — Other Ambulatory Visit (HOSPITAL_BASED_OUTPATIENT_CLINIC_OR_DEPARTMENT_OTHER): Payer: Medicare Other

## 2015-11-23 ENCOUNTER — Telehealth: Payer: Self-pay | Admitting: Hematology and Oncology

## 2015-11-23 ENCOUNTER — Encounter: Payer: Self-pay | Admitting: Hematology and Oncology

## 2015-11-23 ENCOUNTER — Ambulatory Visit (HOSPITAL_BASED_OUTPATIENT_CLINIC_OR_DEPARTMENT_OTHER): Payer: Medicare Other | Admitting: Hematology and Oncology

## 2015-11-23 VITALS — BP 131/80 | HR 92 | Temp 97.8°F | Resp 16 | Ht 70.0 in | Wt 271.8 lb

## 2015-11-23 DIAGNOSIS — D631 Anemia in chronic kidney disease: Secondary | ICD-10-CM

## 2015-11-23 DIAGNOSIS — Z85819 Personal history of malignant neoplasm of unspecified site of lip, oral cavity, and pharynx: Secondary | ICD-10-CM

## 2015-11-23 DIAGNOSIS — Z85818 Personal history of malignant neoplasm of other sites of lip, oral cavity, and pharynx: Secondary | ICD-10-CM

## 2015-11-23 DIAGNOSIS — N189 Chronic kidney disease, unspecified: Secondary | ICD-10-CM

## 2015-11-23 DIAGNOSIS — N184 Chronic kidney disease, stage 4 (severe): Secondary | ICD-10-CM

## 2015-11-23 DIAGNOSIS — D638 Anemia in other chronic diseases classified elsewhere: Secondary | ICD-10-CM

## 2015-11-23 LAB — CBC & DIFF AND RETIC
BASO%: 0.4 % (ref 0.0–2.0)
Basophils Absolute: 0 10*3/uL (ref 0.0–0.1)
EOS ABS: 0.2 10*3/uL (ref 0.0–0.5)
EOS%: 3.1 % (ref 0.0–7.0)
HCT: 36.8 % — ABNORMAL LOW (ref 38.4–49.9)
HEMOGLOBIN: 11.3 g/dL — AB (ref 13.0–17.1)
Immature Retic Fract: 12.8 % — ABNORMAL HIGH (ref 3.00–10.60)
LYMPH%: 17.8 % (ref 14.0–49.0)
MCH: 27 pg — AB (ref 27.2–33.4)
MCHC: 30.7 g/dL — ABNORMAL LOW (ref 32.0–36.0)
MCV: 87.8 fL (ref 79.3–98.0)
MONO#: 0.6 10*3/uL (ref 0.1–0.9)
MONO%: 9.3 % (ref 0.0–14.0)
NEUT%: 69.4 % (ref 39.0–75.0)
NEUTROS ABS: 4.8 10*3/uL (ref 1.5–6.5)
PLATELETS: 195 10*3/uL (ref 140–400)
RBC: 4.19 10*6/uL — ABNORMAL LOW (ref 4.20–5.82)
RDW: 15.5 % — ABNORMAL HIGH (ref 11.0–14.6)
Retic %: 0.81 % (ref 0.80–1.80)
Retic Ct Abs: 33.94 10*3/uL — ABNORMAL LOW (ref 34.80–93.90)
WBC: 6.9 10*3/uL (ref 4.0–10.3)
lymph#: 1.2 10*3/uL (ref 0.9–3.3)

## 2015-11-23 NOTE — Assessment & Plan Note (Signed)
He has severe anemia of chronic kidney disease, exacerbated by recent hospitalization for pneumonia. He was started on Aranesp and is responding very well to treatment He does not need injection today. I will space out his appointment so that he will come back every 4 weeks for blood work, and Aranesp injection to keep hemoglobin greater than 11

## 2015-11-23 NOTE — Telephone Encounter (Signed)
Gave and printed appt sched and avs fo rpt for March thru [facility]

## 2015-11-23 NOTE — Assessment & Plan Note (Signed)
Clinically, he has no signs of disease recurrence. He had close follow-up with ENT last year with no evidence of recurrence. His last PET CT scan was in 2012 and he had multiple CT scan of the chest, abdomen and pelvis over the past few years which showed no evidence of disease recurrence. He does not need any further medical oncology follow-up for his history of oropharyngeal cancer and he will continue ENT follow-up as needed

## 2015-11-23 NOTE — Progress Notes (Signed)
[facility] OFFICE PROGRESS NOTE  Patient Care Team: Hulan Fess, MD as PCP - General (Family Medicine) Thea Silversmith, MD as Consulting Physician (Radiation Oncology) Melida Quitter, MD as Consulting Physician (Otolaryngology) Edrick Oh, MD (Nephrology) Heath Lark, MD as Consulting Physician (Hematology and Oncology) [name], MD as Consulting Physician (Pulmonary Disease)  SUMMARY OF ONCOLOGIC HISTORY:  This patient had background history oropharyngeal cancer. He was last seen by myself in August 2015 and was discharged from the office. I reviewed the patient's records extensive and collaborated the history with the patient. Summary of his history is as follows: The patient's cancer was diagnosed after he felt a lump in the neck. 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 He was treated with concurrent chemoradiation with every 3 week cisplatin, daily radiation between 06/02/2010 and 06/23/2010. His treatment was complicated by a quiet hypothyroidism, dysphagia, persistent dry mouth, poor dentition and altered taste sensation. His last PET/CT scan in May 2012 showed no evidence of disease. Recently, he was noted to have severe anemia. He had recurrent hospitalizations over the past 6 months. With his last admission to the hospital in December 2016, he had severe pneumonia and respiratory failure. He was told subsequently that the cause was related to aspiration pneumonia and currently he is on thickened diet. He was referred back here because of severe anemia and to discuss the use of ESA. On 10/05/15 and 10/12/2015, he received blood transfusion On 09/28/2015 and 10/01/2015, he received 2 doses of IV iron Starting 10/12/2015, he received Aranesp 200 g to keep hemoglobin >11 INTERVAL HISTORY: Please see below for problem oriented charting. He feels well with reduce oxygen requirement. He denies  chest pain or sensation of shortness of breath The patient denies any recent signs or symptoms of bleeding such as spontaneous epistaxis, hematuria or hematochezia.   REVIEW OF SYSTEMS:   Constitutional: Denies fevers, chills or abnormal weight loss Eyes: Denies blurriness of vision Ears, nose, mouth, throat, and face: Denies mucositis or sore throat Cardiovascular: Denies palpitation, chest discomfort or lower extremity swelling Gastrointestinal:  Denies nausea, heartburn or change in bowel habits Skin: Denies abnormal skin rashes Lymphatics: Denies new lymphadenopathy or easy bruising Neurological:Denies numbness, tingling or new weaknesses Behavioral/Psych: Mood is stable, no new changes  All other systems were reviewed with the patient and are negative.  I have reviewed the past medical history, past surgical history, social history and family history with the patient and they are unchanged from previous note.  ALLERGIES:  has No Known Allergies.  MEDICATIONS:  Current Outpatient Prescriptions  Medication Sig Dispense Refill  . albuterol (PROVENTIL HFA;VENTOLIN HFA) 108 (90 BASE) MCG/ACT inhaler Inhale 2 puffs into the lungs every 6 (six) hours as needed for wheezing or shortness of breath. 1 Inhaler 3  . albuterol (PROVENTIL) (2.5 MG/3ML) 0.083% nebulizer solution Take 2.5 mg by nebulization every 6 (six) hours as needed for wheezing or shortness of breath.     [name] amLODipine (NORVASC) 10 MG tablet Take 10 mg by mouth every morning.     [name] aspirin EC 81 MG tablet Take 81 mg by mouth every morning.    . carboxymethylcellulose (REFRESH) 1 % ophthalmic solution Apply 1 drop to eye 3 (three) times daily.    . cholecalciferol (VITAMIN D) 1000 UNITS tablet Take 1,000 Units by mouth daily.     [name] docusate sodium (COLACE) 100 MG capsule Take 100 mg by mouth  daily.    . gabapentin (NEURONTIN) 300 MG capsule Take 300 mg by mouth at bedtime.     [name] galantamine (RAZADYNE ER) 16 MG 24 hr capsule  Take 16 mg by mouth at bedtime.     [name] levothyroxine (SYNTHROID, LEVOTHROID) 125 MCG tablet Take 125 mcg by mouth daily before breakfast.    . Maltodextrin-Xanthan Gum (RESOURCE THICKENUP CLEAR) POWD Please dispense 1 box ( 1 month supply) 1 Can 1  . metoprolol (LOPRESSOR) 100 MG tablet Take 100 mg by mouth 2 (two) times daily.    . pantoprazole (PROTONIX) 40 MG tablet Take 1 tablet (40 mg total) by mouth 2 (two) times daily. 60 tablet 0  . pravastatin (PRAVACHOL) 20 MG tablet Take 20 mg by mouth at bedtime.    [name] QUEtiapine (SEROQUEL) 100 MG tablet Take 100 mg by mouth at bedtime. Take with 25 mg tablet to equal a total dose of 125 mg    . QUEtiapine (SEROQUEL) 25 MG tablet Take 25 mg by mouth at bedtime. Take with 100mg  tablet to equal a total dose of 125 mg    . sertraline (ZOLOFT) 100 MG tablet Take 150 mg by mouth at bedtime.    . Tamsulosin HCl (FLOMAX) 0.4 MG CAPS Take 0.4 mg by mouth daily after breakfast. Once a day     No current facility-administered medications for this visit.    PHYSICAL EXAMINATION: ECOG PERFORMANCE STATUS: 1 - Symptomatic but completely ambulatory  Filed Vitals:   11/23/15 1005  BP: 131/80  Pulse: 92  Temp: 97.8 F (36.6 C)  Resp: 16   Filed Weights   11/23/15 1005  Weight: 271 lb 12.8 oz (123.288 kg)    GENERAL:alert, no distress and comfortable. He has oxygen via oxygen concentrator SKIN: skin color, texture, turgor are normal, no rashes or significant lesions EYES: normal, Conjunctiva are pink and non-injected, sclera clear OROPHARYNX:no exudate, no erythema and lips, buccal mucosa, and tongue normal  Musculoskeletal:no cyanosis of digits and no clubbing  NEURO: alert & oriented x 3 with fluent speech, no focal motor/sensory deficits  LABORATORY DATA:  I have reviewed the data as listed    Component Value Date/Time   NA 142 10/05/2015 1054   NA 142 09/07/2015 0438   K 4.1 10/05/2015 1054   K 4.8 09/07/2015 0438   CL 95* 09/07/2015 0438    CO2 36* 10/05/2015 1054   CO2 38* 09/07/2015 0438   GLUCOSE 115 10/05/2015 1054   GLUCOSE 93 09/07/2015 0438   BUN 36.3* 10/05/2015 1054   BUN 47* 09/07/2015 0438   CREATININE 2.9* 10/05/2015 1054   CREATININE 2.80* 09/07/2015 0438   CALCIUM 9.3 10/05/2015 1054   CALCIUM 8.9 09/07/2015 0438   PROT 7.5 10/05/2015 1054   PROT 6.2* 09/01/2015 2030   ALBUMIN 2.9* 10/05/2015 1054   ALBUMIN 2.9* 09/01/2015 2030   AST 18 10/05/2015 1054   AST 14* 09/01/2015 2030   ALT 14 10/05/2015 1054   ALT 20 09/01/2015 2030   ALKPHOS 109 10/05/2015 1054   ALKPHOS 73 09/01/2015 2030   BILITOT 0.36 10/05/2015 1054   BILITOT 0.4 09/01/2015 2030   GFRNONAA 21* 09/07/2015 0438   GFRAA 25* 09/07/2015 0438    No results found for: SPEP, UPEP  Lab Results  Component Value Date   WBC 6.9 11/23/2015   NEUTROABS 4.8 11/23/2015   HGB 11.3* 11/23/2015   HCT 36.8* 11/23/2015   MCV 87.8 11/23/2015   PLT 195 11/23/2015  Chemistry      Component Value Date/Time   NA 142 10/05/2015 1054   NA 142 09/07/2015 0438   K 4.1 10/05/2015 1054   K 4.8 09/07/2015 0438   CL 95* 09/07/2015 0438   CO2 36* 10/05/2015 1054   CO2 38* 09/07/2015 0438   BUN 36.3* 10/05/2015 1054   BUN 47* 09/07/2015 0438   CREATININE 2.9* 10/05/2015 1054   CREATININE 2.80* 09/07/2015 0438      Component Value Date/Time   CALCIUM 9.3 10/05/2015 1054   CALCIUM 8.9 09/07/2015 0438   ALKPHOS 109 10/05/2015 1054   ALKPHOS 73 09/01/2015 2030   AST 18 10/05/2015 1054   AST 14* 09/01/2015 2030   ALT 14 10/05/2015 1054   ALT 20 09/01/2015 2030   BILITOT 0.36 10/05/2015 1054   BILITOT 0.4 09/01/2015 2030      ASSESSMENT & PLAN:  History of oropharyngeal cancer Clinically, he has no signs of disease recurrence. He had close follow-up with ENT last year with no evidence of recurrence. His last PET CT scan was in 2012 and he had multiple CT scan of the chest, abdomen and pelvis over the past few years which showed no evidence  of disease recurrence. He does not need any further medical oncology follow-up for his history of oropharyngeal cancer and he will continue ENT follow-up as needed  Anemia in chronic renal disease He has severe anemia of chronic kidney disease, exacerbated by recent hospitalization for pneumonia. He was started on Aranesp and is responding very well to treatment He does not need injection today. I will space out his appointment so that he will come back every 4 weeks for blood work, and Aranesp injection to keep hemoglobin greater than 11    No orders of the defined types were placed in this encounter.   All questions were answered. The patient knows to call the clinic with any problems, questions or concerns. No barriers to learning was detected. I spent 15 minutes counseling the patient face to face. The total time spent in the appointment was 20 minutes and more than 50% was on counseling and review of test results     [facility], [place], MD 11/23/2015 11:47 AM

## 2015-12-02 ENCOUNTER — Telehealth: Payer: Self-pay | Admitting: Pulmonary Disease

## 2015-12-02 MED ORDER — LEVOFLOXACIN 500 MG PO TABS: 500.0000 mg | ORAL_TABLET | Freq: Every day | ORAL | Status: DC

## 2015-12-02 NOTE — Telephone Encounter (Signed)
Spoke with the pt's spouse  She states that pt started running fever (101.6) today and also sats have been lower than usual- requiring up to 6 lpm of o2 when he usually is on 2lpm  She states he is coughing up clear sputum also  I offered appt for tomorrow and she refused  She states that VS told her at last visit at the first sign of illness to call for abx same day  She states that he has PNA frequently and goes downhill fast  She is worried that if we do not get something called in soon she will have to call EMS tonight  NKDA and usually does well on levaquin  Please advise, thanks!

## 2015-12-02 NOTE — Telephone Encounter (Signed)
Do  take levaquin 500mg  once daily  X 7 days If worse go to ER - side effects of above include long qtc - sudden heart attack and tendon problems

## 2015-12-02 NOTE — Telephone Encounter (Signed)
Spoke with (EC) Santiago Glad, aware that Levaquin 500mg  is being called into pharmacy.  Aware of rec's per MR.  Nothing further needed.

## 2015-12-07 ENCOUNTER — Other Ambulatory Visit (HOSPITAL_BASED_OUTPATIENT_CLINIC_OR_DEPARTMENT_OTHER): Payer: Medicare Other

## 2015-12-07 ENCOUNTER — Ambulatory Visit (HOSPITAL_BASED_OUTPATIENT_CLINIC_OR_DEPARTMENT_OTHER): Payer: Medicare Other

## 2015-12-07 VITALS — BP 141/67 | HR 68 | Temp 98.2°F | Resp 24

## 2015-12-07 DIAGNOSIS — N189 Chronic kidney disease, unspecified: Secondary | ICD-10-CM | POA: Diagnosis not present

## 2015-12-07 DIAGNOSIS — N184 Chronic kidney disease, stage 4 (severe): Secondary | ICD-10-CM

## 2015-12-07 DIAGNOSIS — D638 Anemia in other chronic diseases classified elsewhere: Secondary | ICD-10-CM

## 2015-12-07 DIAGNOSIS — D631 Anemia in chronic kidney disease: Secondary | ICD-10-CM | POA: Diagnosis not present

## 2015-12-07 LAB — CBC & DIFF AND RETIC
BASO%: 0.4 % (ref 0.0–2.0)
Basophils Absolute: 0 10*3/uL (ref 0.0–0.1)
EOS%: 1.9 % (ref 0.0–7.0)
Eosinophils Absolute: 0.2 10*3/uL (ref 0.0–0.5)
HCT: 34.3 % — ABNORMAL LOW (ref 38.4–49.9)
HEMOGLOBIN: 10.5 g/dL — AB (ref 13.0–17.1)
Immature Retic Fract: 9.4 % (ref 3.00–10.60)
LYMPH%: 10.8 % — AB (ref 14.0–49.0)
MCH: 27.1 pg — AB (ref 27.2–33.4)
MCHC: 30.6 g/dL — ABNORMAL LOW (ref 32.0–36.0)
MCV: 88.6 fL (ref 79.3–98.0)
MONO#: 0.8 10*3/uL (ref 0.1–0.9)
MONO%: 8.2 % (ref 0.0–14.0)
NEUT%: 78.7 % — ABNORMAL HIGH (ref 39.0–75.0)
NEUTROS ABS: 8 10*3/uL — AB (ref 1.5–6.5)
PLATELETS: 220 10*3/uL (ref 140–400)
RBC: 3.87 10*6/uL — AB (ref 4.20–5.82)
RDW: 14.7 % — ABNORMAL HIGH (ref 11.0–14.6)
Retic %: 0.75 % — ABNORMAL LOW (ref 0.80–1.80)
Retic Ct Abs: 29.03 10*3/uL — ABNORMAL LOW (ref 34.80–93.90)
WBC: 10.2 10*3/uL (ref 4.0–10.3)
lymph#: 1.1 10*3/uL (ref 0.9–3.3)

## 2015-12-07 MED ORDER — DARBEPOETIN ALFA 200 MCG/0.4ML IJ SOSY
200.0000 ug | PREFILLED_SYRINGE | Freq: Once | INTRAMUSCULAR | Status: AC
  Administered 2015-12-07: 200 ug via SUBCUTANEOUS
  Filled 2015-12-07: qty 0.4

## 2016-01-04 ENCOUNTER — Other Ambulatory Visit (HOSPITAL_BASED_OUTPATIENT_CLINIC_OR_DEPARTMENT_OTHER): Payer: Medicare Other

## 2016-01-04 ENCOUNTER — Ambulatory Visit: Payer: Medicare Other

## 2016-01-04 DIAGNOSIS — N189 Chronic kidney disease, unspecified: Secondary | ICD-10-CM | POA: Diagnosis not present

## 2016-01-04 DIAGNOSIS — D631 Anemia in chronic kidney disease: Secondary | ICD-10-CM

## 2016-01-04 DIAGNOSIS — D638 Anemia in other chronic diseases classified elsewhere: Secondary | ICD-10-CM

## 2016-01-04 DIAGNOSIS — N184 Chronic kidney disease, stage 4 (severe): Secondary | ICD-10-CM

## 2016-01-04 LAB — CBC & DIFF AND RETIC
BASO%: 0.3 % (ref 0.0–2.0)
Basophils Absolute: 0 10*3/uL (ref 0.0–0.1)
EOS%: 2.8 % (ref 0.0–7.0)
Eosinophils Absolute: 0.3 10*3/uL (ref 0.0–0.5)
HCT: 39.3 % (ref 38.4–49.9)
HGB: 12.1 g/dL — ABNORMAL LOW (ref 13.0–17.1)
IMMATURE RETIC FRACT: 6.4 % (ref 3.00–10.60)
LYMPH%: 14.6 % (ref 14.0–49.0)
MCH: 26.5 pg — ABNORMAL LOW (ref 27.2–33.4)
MCHC: 30.8 g/dL — ABNORMAL LOW (ref 32.0–36.0)
MCV: 86.2 fL (ref 79.3–98.0)
MONO#: 0.7 10*3/uL (ref 0.1–0.9)
MONO%: 7.6 % (ref 0.0–14.0)
NEUT#: 7.2 10*3/uL — ABNORMAL HIGH (ref 1.5–6.5)
NEUT%: 74.7 % (ref 39.0–75.0)
PLATELETS: 160 10*3/uL (ref 140–400)
RBC: 4.56 10*6/uL (ref 4.20–5.82)
RDW: 15.2 % — ABNORMAL HIGH (ref 11.0–14.6)
Retic %: 0.64 % — ABNORMAL LOW (ref 0.80–1.80)
Retic Ct Abs: 29.18 10*3/uL — ABNORMAL LOW (ref 34.80–93.90)
WBC: 9.6 10*3/uL (ref 4.0–10.3)
lymph#: 1.4 10*3/uL (ref 0.9–3.3)

## 2016-01-04 MED ORDER — DARBEPOETIN ALFA 200 MCG/0.4ML IJ SOSY: 200.0000 ug | PREFILLED_SYRINGE | Freq: Once | INTRAMUSCULAR | Status: DC

## 2016-02-01 ENCOUNTER — Other Ambulatory Visit (HOSPITAL_BASED_OUTPATIENT_CLINIC_OR_DEPARTMENT_OTHER): Payer: Medicare Other

## 2016-02-01 ENCOUNTER — Ambulatory Visit (HOSPITAL_BASED_OUTPATIENT_CLINIC_OR_DEPARTMENT_OTHER): Payer: Medicare Other

## 2016-02-01 VITALS — BP 128/64 | HR 66 | Temp 98.0°F | Resp 20

## 2016-02-01 DIAGNOSIS — N189 Chronic kidney disease, unspecified: Secondary | ICD-10-CM

## 2016-02-01 DIAGNOSIS — N184 Chronic kidney disease, stage 4 (severe): Secondary | ICD-10-CM

## 2016-02-01 DIAGNOSIS — D631 Anemia in chronic kidney disease: Secondary | ICD-10-CM

## 2016-02-01 DIAGNOSIS — D638 Anemia in other chronic diseases classified elsewhere: Secondary | ICD-10-CM

## 2016-02-01 LAB — CBC & DIFF AND RETIC
BASO%: 0.4 % (ref 0.0–2.0)
BASOS ABS: 0 10*3/uL (ref 0.0–0.1)
EOS ABS: 0.3 10*3/uL (ref 0.0–0.5)
EOS%: 3.8 % (ref 0.0–7.0)
HEMATOCRIT: 34.9 % — AB (ref 38.4–49.9)
HEMOGLOBIN: 10.6 g/dL — AB (ref 13.0–17.1)
IMMATURE RETIC FRACT: 5.8 % (ref 3.00–10.60)
LYMPH%: 20.3 % (ref 14.0–49.0)
MCH: 26.4 pg — AB (ref 27.2–33.4)
MCHC: 30.4 g/dL — ABNORMAL LOW (ref 32.0–36.0)
MCV: 86.8 fL (ref 79.3–98.0)
MONO#: 0.7 10*3/uL (ref 0.1–0.9)
MONO%: 9.1 % (ref 0.0–14.0)
NEUT#: 4.8 10*3/uL (ref 1.5–6.5)
NEUT%: 66.4 % (ref 39.0–75.0)
Platelets: 220 10*3/uL (ref 140–400)
RBC: 4.02 10*6/uL — ABNORMAL LOW (ref 4.20–5.82)
RDW: 16.3 % — AB (ref 11.0–14.6)
RETIC %: 1.06 % (ref 0.80–1.80)
Retic Ct Abs: 42.61 10*3/uL (ref 34.80–93.90)
WBC: 7.2 10*3/uL (ref 4.0–10.3)
lymph#: 1.5 10*3/uL (ref 0.9–3.3)

## 2016-02-01 MED ORDER — DARBEPOETIN ALFA 200 MCG/0.4ML IJ SOSY
200.0000 ug | PREFILLED_SYRINGE | Freq: Once | INTRAMUSCULAR | Status: AC
  Administered 2016-02-01: 200 ug via SUBCUTANEOUS
  Filled 2016-02-01: qty 0.4

## 2016-02-17 ENCOUNTER — Ambulatory Visit: Payer: Medicare Other | Admitting: Pulmonary Disease

## 2016-02-29 ENCOUNTER — Ambulatory Visit (HOSPITAL_BASED_OUTPATIENT_CLINIC_OR_DEPARTMENT_OTHER): Payer: Medicare Other

## 2016-02-29 ENCOUNTER — Other Ambulatory Visit (HOSPITAL_BASED_OUTPATIENT_CLINIC_OR_DEPARTMENT_OTHER): Payer: Medicare Other

## 2016-02-29 VITALS — BP 120/82 | HR 68 | Temp 98.2°F | Resp 22

## 2016-02-29 DIAGNOSIS — D631 Anemia in chronic kidney disease: Secondary | ICD-10-CM | POA: Diagnosis not present

## 2016-02-29 DIAGNOSIS — N189 Chronic kidney disease, unspecified: Secondary | ICD-10-CM | POA: Diagnosis not present

## 2016-02-29 DIAGNOSIS — N184 Chronic kidney disease, stage 4 (severe): Secondary | ICD-10-CM

## 2016-02-29 DIAGNOSIS — D638 Anemia in other chronic diseases classified elsewhere: Secondary | ICD-10-CM

## 2016-02-29 LAB — CBC & DIFF AND RETIC
BASO%: 0.6 % (ref 0.0–2.0)
Basophils Absolute: 0.1 10*3/uL (ref 0.0–0.1)
EOS ABS: 0.4 10*3/uL (ref 0.0–0.5)
EOS%: 3.6 % (ref 0.0–7.0)
HEMATOCRIT: 35.1 % — AB (ref 38.4–49.9)
HEMOGLOBIN: 10.9 g/dL — AB (ref 13.0–17.1)
Immature Retic Fract: 10.3 % (ref 3.00–10.60)
LYMPH%: 15.8 % (ref 14.0–49.0)
MCH: 27 pg — AB (ref 27.2–33.4)
MCHC: 31.1 g/dL — ABNORMAL LOW (ref 32.0–36.0)
MCV: 86.9 fL (ref 79.3–98.0)
MONO#: 0.8 10*3/uL (ref 0.1–0.9)
MONO%: 7.5 % (ref 0.0–14.0)
NEUT%: 72.5 % (ref 39.0–75.0)
NEUTROS ABS: 7.7 10*3/uL — AB (ref 1.5–6.5)
Platelets: 241 10*3/uL (ref 140–400)
RBC: 4.04 10*6/uL — ABNORMAL LOW (ref 4.20–5.82)
RDW: 16.7 % — ABNORMAL HIGH (ref 11.0–14.6)
Retic %: 1.14 % (ref 0.80–1.80)
Retic Ct Abs: 46.06 10*3/uL (ref 34.80–93.90)
WBC: 10.7 10*3/uL — AB (ref 4.0–10.3)
lymph#: 1.7 10*3/uL (ref 0.9–3.3)

## 2016-02-29 MED ORDER — DARBEPOETIN ALFA 200 MCG/0.4ML IJ SOSY
200.0000 ug | PREFILLED_SYRINGE | Freq: Once | INTRAMUSCULAR | Status: AC
  Administered 2016-02-29: 200 ug via SUBCUTANEOUS
  Filled 2016-02-29: qty 0.4

## 2016-02-29 NOTE — Patient Instructions (Signed)
Darbepoetin Alfa injection What is this medicine? DARBEPOETIN ALFA (dar be POE e tin AL fa) helps your body make more red blood cells. It is used to treat anemia caused by chronic kidney failure and chemotherapy. This medicine may be used for other purposes; ask your health care provider or pharmacist if you have questions. What should I tell my health care provider before I take this medicine? They need to know if you have any of these conditions: -blood clotting disorders or history of blood clots -cancer patient not on chemotherapy -cystic fibrosis -heart disease, such as angina, heart failure, or a history of a heart attack -hemoglobin level of 12 g/dL or greater -high blood pressure -low levels of folate, iron, or vitamin B12 -seizures -an unusual or allergic reaction to darbepoetin, erythropoietin, albumin, hamster proteins, latex, other medicines, foods, dyes, or preservatives -pregnant or trying to get pregnant -breast-feeding How should I use this medicine? This medicine is for injection into a vein or under the skin. It is usually given by a health care professional in a hospital or clinic setting. If you get this medicine at home, you will be taught how to prepare and give this medicine. Do not shake the solution before you withdraw a dose. Use exactly as directed. Take your medicine at regular intervals. Do not take your medicine more often than directed. It is important that you put your used needles and syringes in a special sharps container. Do not put them in a trash can. If you do not have a sharps container, call your pharmacist or healthcare provider to get one. Talk to your pediatrician regarding the use of this medicine in children. While this medicine may be used in children as young as 1 year for selected conditions, precautions do apply. Overdosage: If you think you have taken too much of this medicine contact a poison control center or emergency room at once. NOTE:  This medicine is only for you. Do not share this medicine with others. What if I miss a dose? If you miss a dose, take it as soon as you can. If it is almost time for your next dose, take only that dose. Do not take double or extra doses. What may interact with this medicine? Do not take this medicine with any of the following medications: -epoetin alfa This list may not describe all possible interactions. Give your health care provider a list of all the medicines, herbs, non-prescription drugs, or dietary supplements you use. Also tell them if you smoke, drink alcohol, or use illegal drugs. Some items may interact with your medicine. What should I watch for while using this medicine? Visit your prescriber or health care professional for regular checks on your progress and for the needed blood tests and blood pressure measurements. It is especially important for the doctor to make sure your hemoglobin level is in the desired range, to limit the risk of potential side effects and to give you the best benefit. Keep all appointments for any recommended tests. Check your blood pressure as directed. Ask your doctor what your blood pressure should be and when you should contact him or her. As your body makes more red blood cells, you may need to take iron, folic acid, or vitamin B supplements. Ask your doctor or health care provider which products are right for you. If you have kidney disease continue dietary restrictions, even though this medication can make you feel better. Talk with your doctor or health care professional about the   foods you eat and the vitamins that you take. What side effects may I notice from receiving this medicine? Side effects that you should report to your doctor or health care professional as soon as possible: -allergic reactions like skin rash, itching or hives, swelling of the face, lips, or tongue -breathing problems -changes in vision -chest pain -confusion, trouble speaking  or understanding -feeling faint or lightheaded, falls -high blood pressure -muscle aches or pains -pain, swelling, warmth in the leg -rapid weight gain -severe headaches -sudden numbness or weakness of the face, arm or leg -trouble walking, dizziness, loss of balance or coordination -seizures (convulsions) -swelling of the ankles, feet, hands -unusually weak or tired Side effects that usually do not require medical attention (report to your doctor or health care professional if they continue or are bothersome): -diarrhea -fever, chills (flu-like symptoms) -headaches -nausea, vomiting -redness, stinging, or swelling at site where injected This list may not describe all possible side effects. Call your doctor for medical advice about side effects. You may report side effects to FDA at 1-800-FDA-1088. Where should I keep my medicine? Keep out of the reach of children. Store in a refrigerator between 2 and 8 degrees C (36 and 46 degrees F). Do not freeze. Do not shake. Throw away any unused portion if using a single-dose vial. Throw away any unused medicine after the expiration date. NOTE: This sheet is a summary. It may not cover all possible information. If you have questions about this medicine, talk to your doctor, pharmacist, or health care provider.    2016, Elsevier/Gold Standard. (2008-08-12 10:23:57)  

## 2016-03-28 ENCOUNTER — Other Ambulatory Visit (HOSPITAL_BASED_OUTPATIENT_CLINIC_OR_DEPARTMENT_OTHER): Payer: Medicare Other

## 2016-03-28 ENCOUNTER — Ambulatory Visit (HOSPITAL_BASED_OUTPATIENT_CLINIC_OR_DEPARTMENT_OTHER): Payer: Medicare Other

## 2016-03-28 VITALS — BP 143/74 | HR 74 | Temp 98.0°F | Resp 20

## 2016-03-28 DIAGNOSIS — D631 Anemia in chronic kidney disease: Secondary | ICD-10-CM

## 2016-03-28 DIAGNOSIS — N189 Chronic kidney disease, unspecified: Secondary | ICD-10-CM | POA: Diagnosis not present

## 2016-03-28 DIAGNOSIS — N184 Chronic kidney disease, stage 4 (severe): Secondary | ICD-10-CM

## 2016-03-28 DIAGNOSIS — D638 Anemia in other chronic diseases classified elsewhere: Secondary | ICD-10-CM

## 2016-03-28 LAB — CBC & DIFF AND RETIC
BASO%: 0.7 % (ref 0.0–2.0)
Basophils Absolute: 0.1 10*3/uL (ref 0.0–0.1)
EOS%: 5.7 % (ref 0.0–7.0)
Eosinophils Absolute: 0.5 10*3/uL (ref 0.0–0.5)
HCT: 33.5 % — ABNORMAL LOW (ref 38.4–49.9)
HGB: 10.4 g/dL — ABNORMAL LOW (ref 13.0–17.1)
Immature Retic Fract: 10 % (ref 3.00–10.60)
LYMPH#: 1.5 10*3/uL (ref 0.9–3.3)
LYMPH%: 18.5 % (ref 14.0–49.0)
MCH: 27.3 pg (ref 27.2–33.4)
MCHC: 31 g/dL — AB (ref 32.0–36.0)
MCV: 87.9 fL (ref 79.3–98.0)
MONO#: 0.8 10*3/uL (ref 0.1–0.9)
MONO%: 9.9 % (ref 0.0–14.0)
NEUT#: 5.3 10*3/uL (ref 1.5–6.5)
NEUT%: 65.2 % (ref 39.0–75.0)
PLATELETS: 198 10*3/uL (ref 140–400)
RBC: 3.81 10*6/uL — AB (ref 4.20–5.82)
RDW: 16.2 % — ABNORMAL HIGH (ref 11.0–14.6)
Retic %: 1.62 % (ref 0.80–1.80)
Retic Ct Abs: 61.72 10*3/uL (ref 34.80–93.90)
WBC: 8.1 10*3/uL (ref 4.0–10.3)

## 2016-03-28 MED ORDER — DARBEPOETIN ALFA 200 MCG/0.4ML IJ SOSY
200.0000 ug | PREFILLED_SYRINGE | Freq: Once | INTRAMUSCULAR | Status: AC
  Administered 2016-03-28: 200 ug via SUBCUTANEOUS
  Filled 2016-03-28: qty 0.4

## 2016-03-28 NOTE — Patient Instructions (Signed)
Darbepoetin Alfa injection What is this medicine? DARBEPOETIN ALFA (dar be POE e tin AL fa) helps your body make more red blood cells. It is used to treat anemia caused by chronic kidney failure and chemotherapy. This medicine may be used for other purposes; ask your health care provider or pharmacist if you have questions. What should I tell my health care provider before I take this medicine? They need to know if you have any of these conditions: -blood clotting disorders or history of blood clots -cancer patient not on chemotherapy -cystic fibrosis -heart disease, such as angina, heart failure, or a history of a heart attack -hemoglobin level of 12 g/dL or greater -high blood pressure -low levels of folate, iron, or vitamin B12 -seizures -an unusual or allergic reaction to darbepoetin, erythropoietin, albumin, hamster proteins, latex, other medicines, foods, dyes, or preservatives -pregnant or trying to get pregnant -breast-feeding How should I use this medicine? This medicine is for injection into a vein or under the skin. It is usually given by a health care professional in a hospital or clinic setting. If you get this medicine at home, you will be taught how to prepare and give this medicine. Do not shake the solution before you withdraw a dose. Use exactly as directed. Take your medicine at regular intervals. Do not take your medicine more often than directed. It is important that you put your used needles and syringes in a special sharps container. Do not put them in a trash can. If you do not have a sharps container, call your pharmacist or healthcare provider to get one. Talk to your pediatrician regarding the use of this medicine in children. While this medicine may be used in children as young as 1 year for selected conditions, precautions do apply. Overdosage: If you think you have taken too much of this medicine contact a poison control center or emergency room at once. NOTE:  This medicine is only for you. Do not share this medicine with others. What if I miss a dose? If you miss a dose, take it as soon as you can. If it is almost time for your next dose, take only that dose. Do not take double or extra doses. What may interact with this medicine? Do not take this medicine with any of the following medications: -epoetin alfa This list may not describe all possible interactions. Give your health care provider a list of all the medicines, herbs, non-prescription drugs, or dietary supplements you use. Also tell them if you smoke, drink alcohol, or use illegal drugs. Some items may interact with your medicine. What should I watch for while using this medicine? Visit your prescriber or health care professional for regular checks on your progress and for the needed blood tests and blood pressure measurements. It is especially important for the doctor to make sure your hemoglobin level is in the desired range, to limit the risk of potential side effects and to give you the best benefit. Keep all appointments for any recommended tests. Check your blood pressure as directed. Ask your doctor what your blood pressure should be and when you should contact him or her. As your body makes more red blood cells, you may need to take iron, folic acid, or vitamin B supplements. Ask your doctor or health care provider which products are right for you. If you have kidney disease continue dietary restrictions, even though this medication can make you feel better. Talk with your doctor or health care professional about the   foods you eat and the vitamins that you take. What side effects may I notice from receiving this medicine? Side effects that you should report to your doctor or health care professional as soon as possible: -allergic reactions like skin rash, itching or hives, swelling of the face, lips, or tongue -breathing problems -changes in vision -chest pain -confusion, trouble speaking  or understanding -feeling faint or lightheaded, falls -high blood pressure -muscle aches or pains -pain, swelling, warmth in the leg -rapid weight gain -severe headaches -sudden numbness or weakness of the face, arm or leg -trouble walking, dizziness, loss of balance or coordination -seizures (convulsions) -swelling of the ankles, feet, hands -unusually weak or tired Side effects that usually do not require medical attention (report to your doctor or health care professional if they continue or are bothersome): -diarrhea -fever, chills (flu-like symptoms) -headaches -nausea, vomiting -redness, stinging, or swelling at site where injected This list may not describe all possible side effects. Call your doctor for medical advice about side effects. You may report side effects to FDA at 1-800-FDA-1088. Where should I keep my medicine? Keep out of the reach of children. Store in a refrigerator between 2 and 8 degrees C (36 and 46 degrees F). Do not freeze. Do not shake. Throw away any unused portion if using a single-dose vial. Throw away any unused medicine after the expiration date. NOTE: This sheet is a summary. It may not cover all possible information. If you have questions about this medicine, talk to your doctor, pharmacist, or health care provider.    2016, Elsevier/Gold Standard. (2008-08-12 10:23:57)  

## 2016-04-01 ENCOUNTER — Encounter: Payer: Self-pay | Admitting: Pulmonary Disease

## 2016-04-01 ENCOUNTER — Ambulatory Visit (INDEPENDENT_AMBULATORY_CARE_PROVIDER_SITE_OTHER): Payer: Medicare Other | Admitting: Pulmonary Disease

## 2016-04-01 VITALS — BP 136/86 | HR 84 | Ht 70.0 in | Wt 270.0 lb

## 2016-04-01 DIAGNOSIS — J9611 Chronic respiratory failure with hypoxia: Secondary | ICD-10-CM

## 2016-04-01 DIAGNOSIS — E662 Morbid (severe) obesity with alveolar hypoventilation: Secondary | ICD-10-CM

## 2016-04-01 NOTE — Progress Notes (Signed)
Current Outpatient Prescriptions on File Prior to Visit  Medication Sig  . albuterol (PROVENTIL HFA;VENTOLIN HFA) 108 (90 BASE) MCG/ACT inhaler Inhale 2 puffs into the lungs every 6 (six) hours as needed for wheezing or shortness of breath.  [name] albuterol (PROVENTIL) (2.5 MG/3ML) 0.083% nebulizer solution Take 2.5 mg by nebulization every 6 (six) hours as needed for wheezing or shortness of breath.   [name] amLODipine (NORVASC) 10 MG tablet Take 10 mg by mouth every morning.   [name] aspirin EC 81 MG tablet Take 81 mg by mouth every morning.  . carboxymethylcellulose (REFRESH) 1 % ophthalmic solution Apply 1 drop to eye 3 (three) times daily.  . cholecalciferol (VITAMIN D) 1000 UNITS tablet Take 1,000 Units by mouth daily.   [name] docusate sodium (COLACE) 100 MG capsule Take 100 mg by mouth daily.  [name] gabapentin (NEURONTIN) 300 MG capsule Take 300 mg by mouth at bedtime.   [name] galantamine (RAZADYNE ER) 16 MG 24 hr capsule Take 16 mg by mouth at bedtime.   [name] levothyroxine (SYNTHROID, LEVOTHROID) 125 MCG tablet Take 125 mcg by mouth daily before breakfast.  . Maltodextrin-Xanthan Gum (RESOURCE THICKENUP CLEAR) POWD Please dispense 1 box ( 1 month supply)  . metoprolol (LOPRESSOR) 100 MG tablet Take 100 mg by mouth 2 (two) times daily.  . pantoprazole (PROTONIX) 40 MG tablet Take 1 tablet (40 mg total) by mouth 2 (two) times daily.  . pravastatin (PRAVACHOL) 20 MG tablet Take 20 mg by mouth at bedtime.  [name] QUEtiapine (SEROQUEL) 100 MG tablet Take 100 mg by mouth at bedtime. Take with 25 mg tablet to equal a total dose of 125 mg  . QUEtiapine (SEROQUEL) 25 MG tablet Take 25 mg by mouth at bedtime. Take with 100mg  tablet to equal a total dose of 125 mg  . sertraline (ZOLOFT) 100 MG tablet Take 150 mg by mouth at bedtime.  . Tamsulosin HCl (FLOMAX) 0.4 MG CAPS Take 0.4 mg by mouth daily after breakfast. Once a day   No current facility-administered medications on file prior to visit.     Chief Complaint  Patient  presents with  . Follow-up    Denies any current breathing issues. Using 2-3L O2 24/7.      Pulmonary tests V/Q scan 05/22/14 >> low probability for PE  Sleep tests PSG 06/03/13 >> AHI 5, SpO2 low 82%, REM 16. 101 min with SpO2 < 88%. Needed 1 liter oxygen. ONO with RA 06/05/13 >> Test time 8 hrs 31 min. Mean SpO2 84%, low SpO2 59%. Spent 8 hrs 1 min with SpO2 < 88%. ONO with 2 liters 06/27/13 >> Test time 7 hrs 25 min. Basal SpO2 91.9%, low SpO2 76%. Spent 11 min with SpO2 < 88%.  Cardiac tests Echo 09/02/15 >> EF 60 to [id number], grade 2 diastolic dysfx, severe AS  Past medical hx Aortic stenosis, HTN, HLD, Achalasia, Hiatal hernia, PSTD, TBI, Hypothyroidism, stage IV CKD, Lt tonsil oropharyngeal cancer (squamous cell) 2011  Past surgical hx, Allergies, Family hx, Social hx all reviewed.  Vital Signs BP 136/86 mmHg  Pulse 84  Ht 5\' 10"  (1.778 m)  Wt 270 lb (122.471 kg)  BMI 38.74 kg/m2  SpO2 95%  History of Present Illness Wesley [name] is a 70 y.o. male with chronic respiratory failure 2nd to OSA/OHS and recurrent aspiration pneumonia.  He also has hx of severe aortic stenosis.  He has been doing much better.  His Hb has improved to around 10.5 with therapies from hematology.  He still has oxygen desaturation with exertion, but is not needing oxygen during the day when sitting down.  He is not having cough, wheeze, or sputum.  His wife feels he is much more alert.  Physical Exam  General - pleasant  ENT - No sinus tenderness, no oral exudate, no LAN Cardiac - s1s2 regular, 3/6 SM Chest - decreased BS, no wheeze Back - No focal tenderness Abd - Soft, non-tender Ext - 2+ edema Neuro - Normal strength Skin - No rashes Psych - normal mood, and behavior   Wt Readings from Last 5 Encounters:  04/01/16 270 lb (122.471 kg)  11/23/15 271 lb 12.8 oz (123.288 kg)  11/05/15 270 lb (122.471 kg)  10/05/15 277 lb (125.646 kg)  10/01/15 270 lb (122.471 kg)    CMP Latest Ref Rng  10/05/2015 09/07/2015 09/04/2015  Glucose 70 - 140 mg/dl 115 93 104(H)  BUN 7.0 - 26.0 mg/dL 36.3(H) 47(H) 57(H)  Creatinine 0.7 - 1.3 mg/dL 2.9(H) 2.80(H) 2.69(H)  Sodium 136 - 145 mEq/L 142 142 147(H)  Potassium 3.5 - 5.1 mEq/L 4.1 4.8 4.7  Chloride 101 - 111 mmol/L - 95(L) 96(L)  CO2 22 - 29 mEq/L 36(H) 38(H) 40(H)  Calcium 8.4 - 10.4 mg/dL 9.3 8.9 8.8(L)  Total Protein 6.4 - 8.3 g/dL 7.5 - -  Total Bilirubin 0.20 - 1.20 mg/dL 0.36 - -  Alkaline Phos 40 - 150 U/L 109 - -  AST 5 - 34 U/L 18 - -  ALT 0 - 55 U/L 14 - -    CBC Latest Ref Rng 03/28/2016 02/29/2016 02/01/2016  WBC 4.0 - 10.3 10e3/uL 8.1 10.7(H) 7.2  Hemoglobin 13.0 - 17.1 g/dL 10.4(L) 10.9(L) 10.6(L)  Hematocrit 38.4 - 49.9 % 33.5(L) 35.1(L) 34.9(L)  Platelets 140 - 400 10e3/uL 198 241 220     Assessment/Plan  Chronic respiratory failure with hypoxia/hypercapnia for obesity hypoventilation syndrome. - continue 2 to 4 liters oxygen with exertion and sleep - explained that we could revisit home oxygen needs if he continues to lose weight  Obstructive sleep apnea. 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 - continue weight loss efforts  Dysphagia with recurrent aspiration pneumonia. - continue D3 diet  Anemia. - aranesp per hematology  Aortic stenosis. - f/u with cardiology >> he would likely be too high a risk for any type of intervention for this    Patient Instructions  Follow up in 6 months     [name], MD [facility] Pager:  [phone] 04/01/2016, 3:02 PM

## 2016-04-01 NOTE — Patient Instructions (Signed)
Follow up in 6 months 

## 2016-04-25 ENCOUNTER — Other Ambulatory Visit (HOSPITAL_BASED_OUTPATIENT_CLINIC_OR_DEPARTMENT_OTHER): Payer: Medicare Other

## 2016-04-25 ENCOUNTER — Ambulatory Visit (HOSPITAL_BASED_OUTPATIENT_CLINIC_OR_DEPARTMENT_OTHER): Payer: Medicare Other

## 2016-04-25 VITALS — BP 137/66 | HR 84 | Temp 97.8°F

## 2016-04-25 DIAGNOSIS — N189 Chronic kidney disease, unspecified: Secondary | ICD-10-CM | POA: Diagnosis not present

## 2016-04-25 DIAGNOSIS — N184 Chronic kidney disease, stage 4 (severe): Secondary | ICD-10-CM

## 2016-04-25 DIAGNOSIS — D631 Anemia in chronic kidney disease: Secondary | ICD-10-CM | POA: Diagnosis not present

## 2016-04-25 DIAGNOSIS — D638 Anemia in other chronic diseases classified elsewhere: Secondary | ICD-10-CM

## 2016-04-25 LAB — CBC & DIFF AND RETIC
BASO%: 0.8 % (ref 0.0–2.0)
Basophils Absolute: 0.1 10*3/uL (ref 0.0–0.1)
EOS ABS: 0.4 10*3/uL (ref 0.0–0.5)
EOS%: 5.4 % (ref 0.0–7.0)
HEMATOCRIT: 35.1 % — AB (ref 38.4–49.9)
HEMOGLOBIN: 10.9 g/dL — AB (ref 13.0–17.1)
IMMATURE RETIC FRACT: 9 % (ref 3.00–10.60)
LYMPH%: 15.6 % (ref 14.0–49.0)
MCH: 27.6 pg (ref 27.2–33.4)
MCHC: 31.1 g/dL — ABNORMAL LOW (ref 32.0–36.0)
MCV: 88.9 fL (ref 79.3–98.0)
MONO#: 0.7 10*3/uL (ref 0.1–0.9)
MONO%: 8.9 % (ref 0.0–14.0)
NEUT%: 69.3 % (ref 39.0–75.0)
NEUTROS ABS: 5.1 10*3/uL (ref 1.5–6.5)
PLATELETS: 189 10*3/uL (ref 140–400)
RBC: 3.95 10*6/uL — ABNORMAL LOW (ref 4.20–5.82)
RDW: 14.8 % — ABNORMAL HIGH (ref 11.0–14.6)
Retic %: 1.15 % (ref 0.80–1.80)
Retic Ct Abs: 45.43 10*3/uL (ref 34.80–93.90)
WBC: 7.4 10*3/uL (ref 4.0–10.3)
lymph#: 1.2 10*3/uL (ref 0.9–3.3)

## 2016-04-25 MED ORDER — DARBEPOETIN ALFA 200 MCG/0.4ML IJ SOSY
200.0000 ug | PREFILLED_SYRINGE | Freq: Once | INTRAMUSCULAR | Status: AC
  Administered 2016-04-25: 200 ug via SUBCUTANEOUS
  Filled 2016-04-25: qty 0.4

## 2016-04-25 NOTE — Patient Instructions (Signed)
Darbepoetin Alfa injection What is this medicine? DARBEPOETIN ALFA (dar be POE e tin AL fa) helps your body make more red blood cells. It is used to treat anemia caused by chronic kidney failure and chemotherapy. This medicine may be used for other purposes; ask your health care provider or pharmacist if you have questions. What should I tell my health care provider before I take this medicine? They need to know if you have any of these conditions: -blood clotting disorders or history of blood clots -cancer patient not on chemotherapy -cystic fibrosis -heart disease, such as angina, heart failure, or a history of a heart attack -hemoglobin level of 12 g/dL or greater -high blood pressure -low levels of folate, iron, or vitamin B12 -seizures -an unusual or allergic reaction to darbepoetin, erythropoietin, albumin, hamster proteins, latex, other medicines, foods, dyes, or preservatives -pregnant or trying to get pregnant -breast-feeding How should I use this medicine? This medicine is for injection into a vein or under the skin. It is usually given by a health care professional in a hospital or clinic setting. If you get this medicine at home, you will be taught how to prepare and give this medicine. Do not shake the solution before you withdraw a dose. Use exactly as directed. Take your medicine at regular intervals. Do not take your medicine more often than directed. It is important that you put your used needles and syringes in a special sharps container. Do not put them in a trash can. If you do not have a sharps container, call your pharmacist or healthcare provider to get one. Talk to your pediatrician regarding the use of this medicine in children. While this medicine may be used in children as young as 1 year for selected conditions, precautions do apply. Overdosage: If you think you have taken too much of this medicine contact a poison control center or emergency room at once. NOTE:  This medicine is only for you. Do not share this medicine with others. What if I miss a dose? If you miss a dose, take it as soon as you can. If it is almost time for your next dose, take only that dose. Do not take double or extra doses. What may interact with this medicine? Do not take this medicine with any of the following medications: -epoetin alfa This list may not describe all possible interactions. Give your health care provider a list of all the medicines, herbs, non-prescription drugs, or dietary supplements you use. Also tell them if you smoke, drink alcohol, or use illegal drugs. Some items may interact with your medicine. What should I watch for while using this medicine? Visit your prescriber or health care professional for regular checks on your progress and for the needed blood tests and blood pressure measurements. It is especially important for the doctor to make sure your hemoglobin level is in the desired range, to limit the risk of potential side effects and to give you the best benefit. Keep all appointments for any recommended tests. Check your blood pressure as directed. Ask your doctor what your blood pressure should be and when you should contact him or her. As your body makes more red blood cells, you may need to take iron, folic acid, or vitamin B supplements. Ask your doctor or health care provider which products are right for you. If you have kidney disease continue dietary restrictions, even though this medication can make you feel better. Talk with your doctor or health care professional about the   foods you eat and the vitamins that you take. What side effects may I notice from receiving this medicine? Side effects that you should report to your doctor or health care professional as soon as possible: -allergic reactions like skin rash, itching or hives, swelling of the face, lips, or tongue -breathing problems -changes in vision -chest pain -confusion, trouble speaking  or understanding -feeling faint or lightheaded, falls -high blood pressure -muscle aches or pains -pain, swelling, warmth in the leg -rapid weight gain -severe headaches -sudden numbness or weakness of the face, arm or leg -trouble walking, dizziness, loss of balance or coordination -seizures (convulsions) -swelling of the ankles, feet, hands -unusually weak or tired Side effects that usually do not require medical attention (report to your doctor or health care professional if they continue or are bothersome): -diarrhea -fever, chills (flu-like symptoms) -headaches -nausea, vomiting -redness, stinging, or swelling at site where injected This list may not describe all possible side effects. Call your doctor for medical advice about side effects. You may report side effects to FDA at 1-800-FDA-1088. Where should I keep my medicine? Keep out of the reach of children. Store in a refrigerator between 2 and 8 degrees C (36 and 46 degrees F). Do not freeze. Do not shake. Throw away any unused portion if using a single-dose vial. Throw away any unused medicine after the expiration date. NOTE: This sheet is a summary. It may not cover all possible information. If you have questions about this medicine, talk to your doctor, pharmacist, or health care provider.    2016, Elsevier/Gold Standard. (2008-08-12 10:23:57)  

## 2016-05-23 ENCOUNTER — Ambulatory Visit (HOSPITAL_BASED_OUTPATIENT_CLINIC_OR_DEPARTMENT_OTHER): Payer: Medicare Other | Admitting: Hematology and Oncology

## 2016-05-23 ENCOUNTER — Other Ambulatory Visit (HOSPITAL_BASED_OUTPATIENT_CLINIC_OR_DEPARTMENT_OTHER): Payer: Medicare Other

## 2016-05-23 ENCOUNTER — Ambulatory Visit: Payer: Medicare Other

## 2016-05-23 ENCOUNTER — Encounter (HOSPITAL_BASED_OUTPATIENT_CLINIC_OR_DEPARTMENT_OTHER): Payer: Medicare Other

## 2016-05-23 ENCOUNTER — Encounter: Payer: Self-pay | Admitting: Hematology and Oncology

## 2016-05-23 DIAGNOSIS — N184 Chronic kidney disease, stage 4 (severe): Secondary | ICD-10-CM

## 2016-05-23 DIAGNOSIS — N189 Chronic kidney disease, unspecified: Secondary | ICD-10-CM

## 2016-05-23 DIAGNOSIS — D638 Anemia in other chronic diseases classified elsewhere: Secondary | ICD-10-CM

## 2016-05-23 DIAGNOSIS — Z85819 Personal history of malignant neoplasm of unspecified site of lip, oral cavity, and pharynx: Secondary | ICD-10-CM | POA: Diagnosis not present

## 2016-05-23 DIAGNOSIS — J9611 Chronic respiratory failure with hypoxia: Secondary | ICD-10-CM | POA: Diagnosis not present

## 2016-05-23 DIAGNOSIS — D631 Anemia in chronic kidney disease: Secondary | ICD-10-CM

## 2016-05-23 DIAGNOSIS — I10 Essential (primary) hypertension: Secondary | ICD-10-CM

## 2016-05-23 LAB — CBC & DIFF AND RETIC
BASO%: 0.4 % (ref 0.0–2.0)
Basophils Absolute: 0 10*3/uL (ref 0.0–0.1)
EOS ABS: 0.3 10*3/uL (ref 0.0–0.5)
EOS%: 3.4 % (ref 0.0–7.0)
HCT: 33.2 % — ABNORMAL LOW (ref 38.4–49.9)
HEMOGLOBIN: 10 g/dL — AB (ref 13.0–17.1)
IMMATURE RETIC FRACT: 15.5 % — AB (ref 3.00–10.60)
LYMPH%: 14 % (ref 14.0–49.0)
MCH: 26.5 pg — AB (ref 27.2–33.4)
MCHC: 30.1 g/dL — ABNORMAL LOW (ref 32.0–36.0)
MCV: 88.1 fL (ref 79.3–98.0)
MONO#: 0.9 10*3/uL (ref 0.1–0.9)
MONO%: 9.4 % (ref 0.0–14.0)
NEUT%: 72.8 % (ref 39.0–75.0)
NEUTROS ABS: 6.7 10*3/uL — AB (ref 1.5–6.5)
Platelets: 236 10*3/uL (ref 140–400)
RBC: 3.77 10*6/uL — ABNORMAL LOW (ref 4.20–5.82)
RDW: 15 % — AB (ref 11.0–14.6)
RETIC %: 1.37 % (ref 0.80–1.80)
Retic Ct Abs: 51.65 10*3/uL (ref 34.80–93.90)
WBC: 9.2 10*3/uL (ref 4.0–10.3)
lymph#: 1.3 10*3/uL (ref 0.9–3.3)

## 2016-05-23 MED ORDER — DARBEPOETIN ALFA 200 MCG/0.4ML IJ SOSY
200.0000 ug | PREFILLED_SYRINGE | Freq: Once | INTRAMUSCULAR | Status: AC
  Administered 2016-05-23: 200 ug via SUBCUTANEOUS
  Filled 2016-05-23: qty 0.4

## 2016-05-23 NOTE — Assessment & Plan Note (Signed)
The patient has chronic respiratory failure and is oxygen dependent. He will continue the same. According to the patient, since improvement of anemia, he is using less oxygen

## 2016-05-23 NOTE — Patient Instructions (Signed)
Darbepoetin Alfa injection What is this medicine? DARBEPOETIN ALFA (dar be POE e tin AL fa) helps your body make more red blood cells. It is used to treat anemia caused by chronic kidney failure and chemotherapy. This medicine may be used for other purposes; ask your health care provider or pharmacist if you have questions. What should I tell my health care provider before I take this medicine? They need to know if you have any of these conditions: -blood clotting disorders or history of blood clots -cancer patient not on chemotherapy -cystic fibrosis -heart disease, such as angina, heart failure, or a history of a heart attack -hemoglobin level of 12 g/dL or greater -high blood pressure -low levels of folate, iron, or vitamin B12 -seizures -an unusual or allergic reaction to darbepoetin, erythropoietin, albumin, hamster proteins, latex, other medicines, foods, dyes, or preservatives -pregnant or trying to get pregnant -breast-feeding How should I use this medicine? This medicine is for injection into a vein or under the skin. It is usually given by a health care professional in a hospital or clinic setting. If you get this medicine at home, you will be taught how to prepare and give this medicine. Do not shake the solution before you withdraw a dose. Use exactly as directed. Take your medicine at regular intervals. Do not take your medicine more often than directed. It is important that you put your used needles and syringes in a special sharps container. Do not put them in a trash can. If you do not have a sharps container, call your pharmacist or healthcare provider to get one. Talk to your pediatrician regarding the use of this medicine in children. While this medicine may be used in children as young as 1 year for selected conditions, precautions do apply. Overdosage: If you think you have taken too much of this medicine contact a poison control center or emergency room at once. NOTE:  This medicine is only for you. Do not share this medicine with others. What if I miss a dose? If you miss a dose, take it as soon as you can. If it is almost time for your next dose, take only that dose. Do not take double or extra doses. What may interact with this medicine? Do not take this medicine with any of the following medications: -epoetin alfa This list may not describe all possible interactions. Give your health care provider a list of all the medicines, herbs, non-prescription drugs, or dietary supplements you use. Also tell them if you smoke, drink alcohol, or use illegal drugs. Some items may interact with your medicine. What should I watch for while using this medicine? Visit your prescriber or health care professional for regular checks on your progress and for the needed blood tests and blood pressure measurements. It is especially important for the doctor to make sure your hemoglobin level is in the desired range, to limit the risk of potential side effects and to give you the best benefit. Keep all appointments for any recommended tests. Check your blood pressure as directed. Ask your doctor what your blood pressure should be and when you should contact him or her. As your body makes more red blood cells, you may need to take iron, folic acid, or vitamin B supplements. Ask your doctor or health care provider which products are right for you. If you have kidney disease continue dietary restrictions, even though this medication can make you feel better. Talk with your doctor or health care professional about the   foods you eat and the vitamins that you take. What side effects may I notice from receiving this medicine? Side effects that you should report to your doctor or health care professional as soon as possible: -allergic reactions like skin rash, itching or hives, swelling of the face, lips, or tongue -breathing problems -changes in vision -chest pain -confusion, trouble speaking  or understanding -feeling faint or lightheaded, falls -high blood pressure -muscle aches or pains -pain, swelling, warmth in the leg -rapid weight gain -severe headaches -sudden numbness or weakness of the face, arm or leg -trouble walking, dizziness, loss of balance or coordination -seizures (convulsions) -swelling of the ankles, feet, hands -unusually weak or tired Side effects that usually do not require medical attention (report to your doctor or health care professional if they continue or are bothersome): -diarrhea -fever, chills (flu-like symptoms) -headaches -nausea, vomiting -redness, stinging, or swelling at site where injected This list may not describe all possible side effects. Call your doctor for medical advice about side effects. You may report side effects to FDA at 1-800-FDA-1088. Where should I keep my medicine? Keep out of the reach of children. Store in a refrigerator between 2 and 8 degrees C (36 and 46 degrees F). Do not freeze. Do not shake. Throw away any unused portion if using a single-dose vial. Throw away any unused medicine after the expiration date. NOTE: This sheet is a summary. It may not cover all possible information. If you have questions about this medicine, talk to your doctor, pharmacist, or health care provider.    2016, Elsevier/Gold Standard. (2008-08-12 10:23:57)  

## 2016-05-23 NOTE — Progress Notes (Signed)
[facility] OFFICE PROGRESS NOTE  [name], MD SUMMARY OF HEMATOLOGIC HISTORY:  This patient had background history oropharyngeal cancer. He was last seen by myself in August 2015 and was discharged from the office. I reviewed the patient's records extensive and collaborated the history with the patient. Summary of his history is as follows: The patient's cancer was diagnosed after he felt a lump in the neck. 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 He was treated with concurrent chemoradiation with every 3 week cisplatin, daily radiation between 06/02/2010 and 06/23/2010. His treatment was complicated by a quiet hypothyroidism, dysphagia, persistent dry mouth, poor dentition and altered taste sensation. His last PET/CT scan in May 2012 showed no evidence of disease. Recently, he was noted to have severe anemia. He had recurrent hospitalizations over the past 6 months. With his last admission to the hospital in December 2016, he had severe pneumonia and respiratory failure. He was told subsequently that the cause was related to aspiration pneumonia and currently he is on thickened diet. He was referred back here because of severe anemia and to discuss the use of ESA. On 10/05/15 and 10/12/2015, he received blood transfusion On 09/28/2015 and 10/01/2015, he received 2 doses of IV iron Starting 10/12/2015, he received Aranesp 200 g to keep hemoglobin >11 INTERVAL HISTORY: Please see below for problem oriented charting. He feels well with reduce oxygen requirement. He denies chest pain or sensation of shortness of breath The patient denies any recent signs or symptoms of bleeding such as spontaneous epistaxis, hematuria or hematochezia.  I have reviewed the past medical history, past surgical history, social history and family history with the patient and they are unchanged from previous  note.  ALLERGIES:  has No Known Allergies.  MEDICATIONS:  Current Outpatient Prescriptions  Medication Sig Dispense Refill  . albuterol (PROVENTIL HFA;VENTOLIN HFA) 108 (90 BASE) MCG/ACT inhaler Inhale 2 puffs into the lungs every 6 (six) hours as needed for wheezing or shortness of breath. 1 Inhaler 3  . albuterol (PROVENTIL) (2.5 MG/3ML) 0.083% nebulizer solution Take 2.5 mg by nebulization every 6 (six) hours as needed for wheezing or shortness of breath.     [name] amLODipine (NORVASC) 10 MG tablet Take 10 mg by mouth every morning.     [name] aspirin EC 81 MG tablet Take 81 mg by mouth every morning.    . carboxymethylcellulose (REFRESH) 1 % ophthalmic solution Apply 1 drop to eye 3 (three) times daily.    . cholecalciferol (VITAMIN D) 1000 UNITS tablet Take 1,000 Units by mouth daily.     . ciprofloxacin (CIPRO) 500 MG tablet Take 500 mg by mouth 2 (two) times daily.    [name] docusate sodium (COLACE) 100 MG capsule Take 100 mg by mouth daily.    [name] gabapentin (NEURONTIN) 300 MG capsule Take 300 mg by mouth at bedtime.     [name] galantamine (RAZADYNE ER) 16 MG 24 hr capsule Take 16 mg by mouth at bedtime.     [name] levothyroxine (SYNTHROID, LEVOTHROID) 125 MCG tablet Take 125 mcg by mouth daily before breakfast.    . Maltodextrin-Xanthan Gum (RESOURCE THICKENUP CLEAR) POWD Please dispense 1 box ( 1 month supply) 1 Can 1  . metoprolol (LOPRESSOR) 100 MG tablet Take 100 mg by mouth 2 (two) times daily.    . metroNIDAZOLE (FLAGYL) 500 MG tablet Take 500 mg by mouth 2 (two) times daily.    [name]  pantoprazole (PROTONIX) 40 MG tablet Take 1 tablet (40 mg total) by mouth 2 (two) times daily. 60 tablet 0  . pravastatin (PRAVACHOL) 20 MG tablet Take 20 mg by mouth at bedtime.    [name] QUEtiapine (SEROQUEL) 100 MG tablet Take 100 mg by mouth at bedtime. Take with 25 mg tablet to equal a total dose of 125 mg    . QUEtiapine (SEROQUEL) 25 MG tablet Take 25 mg by mouth at bedtime. Take with 100mg  tablet to equal a total dose of  125 mg    . sertraline (ZOLOFT) 100 MG tablet Take 150 mg by mouth at bedtime.    . Tamsulosin HCl (FLOMAX) 0.4 MG CAPS Take 0.4 mg by mouth daily after breakfast. Once a day     No current facility-administered medications for this visit.      REVIEW OF SYSTEMS:   Constitutional: Denies fevers, chills or night sweats Eyes: Denies blurriness of vision Ears, nose, mouth, throat, and face: Denies mucositis or sore throat Respiratory: Denies cough, dyspnea or wheezes Cardiovascular: Denies palpitation, chest discomfort or lower extremity swelling Gastrointestinal:  Denies nausea, heartburn or change in bowel habits Skin: Denies abnormal skin rashes Lymphatics: Denies new lymphadenopathy or easy bruising Neurological:Denies numbness, tingling or new weaknesses Behavioral/Psych: Mood is stable, no new changes  All other systems were reviewed with the patient and are negative.  PHYSICAL EXAMINATION: ECOG PERFORMANCE STATUS: 2 - Symptomatic, <50% confined to bed  Vitals:   05/23/16 1103  BP: 130/82  Pulse: 68  Resp: 19  Temp: 97.7 F (36.5 C)   Filed Weights   05/23/16 1103  Weight: 275 lb 8 oz (125 kg)    GENERAL:alert, no distress and comfortable. He is sitting on the wheelchair with oxygen concentrator SKIN: Noted skin bruising EYES: normal, Conjunctiva are pink and non-injected, sclera clear OROPHARYNX:no exudate, no erythema and lips, buccal mucosa, and tongue normal  NECK: supple, thyroid normal size, non-tender, without nodularity LYMPH:  no palpable lymphadenopathy in the cervical, axillary or inguinal LUNGS: clear to auscultation and percussion with normal breathing effort HEART: regular rate & rhythm with loud ejection systolic murmur and no lower extremity edema ABDOMEN:abdomen soft, non-tender and normal bowel sounds Musculoskeletal:no cyanosis of digits and no clubbing  NEURO: alert & oriented x 3 with fluent speech, no focal motor/sensory deficits  LABORATORY  DATA:  I have reviewed the data as listed     Component Value Date/Time   NA 142 10/05/2015 1054   K 4.1 10/05/2015 1054   CL 95 (L) 09/07/2015 0438   CO2 36 (H) 10/05/2015 1054   GLUCOSE 115 10/05/2015 1054   BUN 36.3 (H) 10/05/2015 1054   CREATININE 2.9 (H) 10/05/2015 1054   CALCIUM 9.3 10/05/2015 1054   PROT 7.5 10/05/2015 1054   ALBUMIN 2.9 (L) 10/05/2015 1054   AST 18 10/05/2015 1054   ALT 14 10/05/2015 1054   ALKPHOS 109 10/05/2015 1054   BILITOT 0.36 10/05/2015 1054   GFRNONAA 21 (L) 09/07/2015 0438   GFRAA 25 (L) 09/07/2015 0438    No results found for: SPEP, UPEP  Lab Results  Component Value Date   WBC 9.2 05/23/2016   NEUTROABS 6.7 (H) 05/23/2016   HGB 10.0 (L) 05/23/2016   HCT 33.2 (L) 05/23/2016   MCV 88.1 05/23/2016   PLT 236 05/23/2016      Chemistry      Component Value Date/Time   NA 142 10/05/2015 1054   K 4.1 10/05/2015 1054   CL 95 (  L) 09/07/2015 0438   CO2 36 (H) 10/05/2015 1054   BUN 36.3 (H) 10/05/2015 1054   CREATININE 2.9 (H) 10/05/2015 1054      Component Value Date/Time   CALCIUM 9.3 10/05/2015 1054   ALKPHOS 109 10/05/2015 1054   AST 18 10/05/2015 1054   ALT 14 10/05/2015 1054   BILITOT 0.36 10/05/2015 1054       ASSESSMENT & PLAN:  History of oropharyngeal cancer Clinically, he has no signs of disease recurrence. He had close follow-up with ENT last year with no evidence of recurrence. His last PET CT scan was in 2012 and he had multiple CT scan of the chest, abdomen and pelvis over the past few years which showed no evidence of disease recurrence. He does not need any further medical oncology follow-up for his history of oropharyngeal cancer and he will continue ENT follow-up as needed  Anemia in chronic renal disease He has severe anemia of chronic kidney disease, exacerbated by recent hospitalization for pneumonia. He was started on Aranesp and is responding very well to treatment I will space out his appointment so that  he will come back every 4 weeks for blood work, and Aranesp injection to keep hemoglobin greater than 11  Essential hypertension he will continue current medical management. I recommend close follow-up with primary care doctor for medication adjustment.   Chronic respiratory failure with hypoxia (HCC) The patient has chronic respiratory failure and is oxygen dependent. He will continue the same. According to the patient, since improvement of anemia, he is using less oxygen   All questions were answered. The patient knows to call the clinic with any problems, questions or concerns. No barriers to learning was detected.  I spent 15 minutes counseling the patient face to face. The total time spent in the appointment was 20 minutes and more than 50% was on counseling.     [facility], Abbie Jablon, MD 9/11/201712:41 PM

## 2016-05-23 NOTE — Assessment & Plan Note (Signed)
Clinically, he has no signs of disease recurrence. He had close follow-up with ENT last year with no evidence of recurrence. His last PET CT scan was in 2012 and he had multiple CT scan of the chest, abdomen and pelvis over the past few years which showed no evidence of disease recurrence. He does not need any further medical oncology follow-up for his history of oropharyngeal cancer and he will continue ENT follow-up as needed

## 2016-05-23 NOTE — Assessment & Plan Note (Signed)
he will continue current medical management. I recommend close follow-up with primary care doctor for medication adjustment.  

## 2016-05-23 NOTE — Assessment & Plan Note (Signed)
He has severe anemia of chronic kidney disease, exacerbated by recent hospitalization for pneumonia. He was started on Aranesp and is responding very well to treatment I will space out his appointment so that he will come back every 4 weeks for blood work, and Aranesp injection to keep hemoglobin greater than 11

## 2016-05-25 ENCOUNTER — Telehealth: Payer: Self-pay | Admitting: Hematology and Oncology

## 2016-05-25 NOTE — Telephone Encounter (Signed)
SPOKE WITH PATIENT'S WIFE THIS MORNING RE NEXT TWO APPOINTMENTS FOR October AND November.   PER WIFE APPOINTMENTS MOVED FROM MONDAYS TO WEDNESDAYS. WIFE MADE AWARE THAT LAST APPOINTMENT FOR MARCH WHICH WILL INCLUDE A F/U WILL NEED TO BE SCHEDULED FOR A Thursday - NG (MD ONLY [facility]).    SCHEDULE MAILED.

## 2016-05-30 ENCOUNTER — Other Ambulatory Visit: Payer: Self-pay | Admitting: Hematology and Oncology

## 2016-05-30 DIAGNOSIS — D539 Nutritional anemia, unspecified: Secondary | ICD-10-CM

## 2016-06-06 ENCOUNTER — Other Ambulatory Visit: Payer: Self-pay | Admitting: Hematology and Oncology

## 2016-06-06 DIAGNOSIS — N184 Chronic kidney disease, stage 4 (severe): Principal | ICD-10-CM

## 2016-06-06 DIAGNOSIS — N189 Chronic kidney disease, unspecified: Secondary | ICD-10-CM | POA: Insufficient documentation

## 2016-06-06 DIAGNOSIS — D631 Anemia in chronic kidney disease: Secondary | ICD-10-CM

## 2016-06-20 ENCOUNTER — Other Ambulatory Visit: Payer: Medicare Other

## 2016-06-20 ENCOUNTER — Ambulatory Visit: Payer: Medicare Other

## 2016-06-22 ENCOUNTER — Other Ambulatory Visit (HOSPITAL_BASED_OUTPATIENT_CLINIC_OR_DEPARTMENT_OTHER): Payer: Medicare Other

## 2016-06-22 ENCOUNTER — Ambulatory Visit (HOSPITAL_BASED_OUTPATIENT_CLINIC_OR_DEPARTMENT_OTHER): Payer: Medicare Other

## 2016-06-22 VITALS — BP 121/55 | HR 80 | Temp 98.1°F | Resp 20

## 2016-06-22 DIAGNOSIS — D631 Anemia in chronic kidney disease: Secondary | ICD-10-CM

## 2016-06-22 DIAGNOSIS — D638 Anemia in other chronic diseases classified elsewhere: Secondary | ICD-10-CM

## 2016-06-22 DIAGNOSIS — N189 Chronic kidney disease, unspecified: Secondary | ICD-10-CM

## 2016-06-22 DIAGNOSIS — N184 Chronic kidney disease, stage 4 (severe): Secondary | ICD-10-CM

## 2016-06-22 LAB — CBC & DIFF AND RETIC
BASO%: 0.2 % (ref 0.0–2.0)
Basophils Absolute: 0 10*3/uL (ref 0.0–0.1)
EOS ABS: 0.2 10*3/uL (ref 0.0–0.5)
EOS%: 1.4 % (ref 0.0–7.0)
HCT: 35.4 % — ABNORMAL LOW (ref 38.4–49.9)
HEMOGLOBIN: 10.7 g/dL — AB (ref 13.0–17.1)
IMMATURE RETIC FRACT: 6.2 % (ref 3.00–10.60)
LYMPH%: 9.9 % — ABNORMAL LOW (ref 14.0–49.0)
MCH: 26.5 pg — ABNORMAL LOW (ref 27.2–33.4)
MCHC: 30.2 g/dL — ABNORMAL LOW (ref 32.0–36.0)
MCV: 87.6 fL (ref 79.3–98.0)
MONO#: 1.1 10*3/uL — AB (ref 0.1–0.9)
MONO%: 7.4 % (ref 0.0–14.0)
NEUT%: 81.1 % — ABNORMAL HIGH (ref 39.0–75.0)
NEUTROS ABS: 12.5 10*3/uL — AB (ref 1.5–6.5)
Platelets: 225 10*3/uL (ref 140–400)
RBC: 4.04 10*6/uL — ABNORMAL LOW (ref 4.20–5.82)
RDW: 16.2 % — AB (ref 11.0–14.6)
RETIC %: 1.26 % (ref 0.80–1.80)
Retic Ct Abs: 50.9 10*3/uL (ref 34.80–93.90)
WBC: 15.4 10*3/uL — AB (ref 4.0–10.3)
lymph#: 1.5 10*3/uL (ref 0.9–3.3)

## 2016-06-22 MED ORDER — DARBEPOETIN ALFA 200 MCG/0.4ML IJ SOSY
200.0000 ug | PREFILLED_SYRINGE | Freq: Once | INTRAMUSCULAR | Status: AC
  Administered 2016-06-22: 200 ug via SUBCUTANEOUS
  Filled 2016-06-22: qty 0.4

## 2016-06-22 NOTE — Patient Instructions (Signed)
Darbepoetin Alfa injection What is this medicine? DARBEPOETIN ALFA (dar be POE e tin AL fa) helps your body make more red blood cells. It is used to treat anemia caused by chronic kidney failure and chemotherapy. This medicine may be used for other purposes; ask your health care provider or pharmacist if you have questions. What should I tell my health care provider before I take this medicine? They need to know if you have any of these conditions: -blood clotting disorders or history of blood clots -cancer patient not on chemotherapy -cystic fibrosis -heart disease, such as angina, heart failure, or a history of a heart attack -hemoglobin level of 12 g/dL or greater -high blood pressure -low levels of folate, iron, or vitamin B12 -seizures -an unusual or allergic reaction to darbepoetin, erythropoietin, albumin, hamster proteins, latex, other medicines, foods, dyes, or preservatives -pregnant or trying to get pregnant -breast-feeding How should I use this medicine? This medicine is for injection into a vein or under the skin. It is usually given by a health care professional in a hospital or clinic setting. If you get this medicine at home, you will be taught how to prepare and give this medicine. Do not shake the solution before you withdraw a dose. Use exactly as directed. Take your medicine at regular intervals. Do not take your medicine more often than directed. It is important that you put your used needles and syringes in a special sharps container. Do not put them in a trash can. If you do not have a sharps container, call your pharmacist or healthcare provider to get one. Talk to your pediatrician regarding the use of this medicine in children. While this medicine may be used in children as young as 1 year for selected conditions, precautions do apply. Overdosage: If you think you have taken too much of this medicine contact a poison control center or emergency room at once. NOTE:  This medicine is only for you. Do not share this medicine with others. What if I miss a dose? If you miss a dose, take it as soon as you can. If it is almost time for your next dose, take only that dose. Do not take double or extra doses. What may interact with this medicine? Do not take this medicine with any of the following medications: -epoetin alfa This list may not describe all possible interactions. Give your health care provider a list of all the medicines, herbs, non-prescription drugs, or dietary supplements you use. Also tell them if you smoke, drink alcohol, or use illegal drugs. Some items may interact with your medicine. What should I watch for while using this medicine? Visit your prescriber or health care professional for regular checks on your progress and for the needed blood tests and blood pressure measurements. It is especially important for the doctor to make sure your hemoglobin level is in the desired range, to limit the risk of potential side effects and to give you the best benefit. Keep all appointments for any recommended tests. Check your blood pressure as directed. Ask your doctor what your blood pressure should be and when you should contact him or her. As your body makes more red blood cells, you may need to take iron, folic acid, or vitamin B supplements. Ask your doctor or health care provider which products are right for you. If you have kidney disease continue dietary restrictions, even though this medication can make you feel better. Talk with your doctor or health care professional about the   foods you eat and the vitamins that you take. What side effects may I notice from receiving this medicine? Side effects that you should report to your doctor or health care professional as soon as possible: -allergic reactions like skin rash, itching or hives, swelling of the face, lips, or tongue -breathing problems -changes in vision -chest pain -confusion, trouble speaking  or understanding -feeling faint or lightheaded, falls -high blood pressure -muscle aches or pains -pain, swelling, warmth in the leg -rapid weight gain -severe headaches -sudden numbness or weakness of the face, arm or leg -trouble walking, dizziness, loss of balance or coordination -seizures (convulsions) -swelling of the ankles, feet, hands -unusually weak or tired Side effects that usually do not require medical attention (report to your doctor or health care professional if they continue or are bothersome): -diarrhea -fever, chills (flu-like symptoms) -headaches -nausea, vomiting -redness, stinging, or swelling at site where injected This list may not describe all possible side effects. Call your doctor for medical advice about side effects. You may report side effects to FDA at 1-800-FDA-1088. Where should I keep my medicine? Keep out of the reach of children. Store in a refrigerator between 2 and 8 degrees C (36 and 46 degrees F). Do not freeze. Do not shake. Throw away any unused portion if using a single-dose vial. Throw away any unused medicine after the expiration date. NOTE: This sheet is a summary. It may not cover all possible information. If you have questions about this medicine, talk to your doctor, pharmacist, or health care provider.    2016, Elsevier/Gold Standard. (2008-08-12 10:23:57)  

## 2016-06-30 ENCOUNTER — Encounter: Payer: Self-pay | Admitting: Cardiovascular Disease

## 2016-07-05 ENCOUNTER — Other Ambulatory Visit: Payer: Self-pay

## 2016-07-05 ENCOUNTER — Ambulatory Visit (HOSPITAL_COMMUNITY): Payer: Medicare Other | Attending: Cardiology

## 2016-07-05 ENCOUNTER — Encounter (HOSPITAL_COMMUNITY): Payer: Self-pay | Admitting: *Deleted

## 2016-07-05 ENCOUNTER — Other Ambulatory Visit: Payer: Self-pay | Admitting: Cardiovascular Disease

## 2016-07-05 DIAGNOSIS — G4733 Obstructive sleep apnea (adult) (pediatric): Secondary | ICD-10-CM | POA: Insufficient documentation

## 2016-07-05 DIAGNOSIS — Z6839 Body mass index (BMI) 39.0-39.9, adult: Secondary | ICD-10-CM | POA: Insufficient documentation

## 2016-07-05 DIAGNOSIS — I77810 Thoracic aortic ectasia: Secondary | ICD-10-CM | POA: Insufficient documentation

## 2016-07-05 DIAGNOSIS — E669 Obesity, unspecified: Secondary | ICD-10-CM | POA: Diagnosis not present

## 2016-07-05 DIAGNOSIS — N189 Chronic kidney disease, unspecified: Secondary | ICD-10-CM | POA: Insufficient documentation

## 2016-07-05 DIAGNOSIS — I350 Nonrheumatic aortic (valve) stenosis: Secondary | ICD-10-CM | POA: Diagnosis not present

## 2016-07-05 DIAGNOSIS — I1310 Hypertensive heart and chronic kidney disease without heart failure, with stage 1 through stage 4 chronic kidney disease, or unspecified chronic kidney disease: Secondary | ICD-10-CM | POA: Diagnosis not present

## 2016-07-05 DIAGNOSIS — Z85818 Personal history of malignant neoplasm of other sites of lip, oral cavity, and pharynx: Secondary | ICD-10-CM | POA: Insufficient documentation

## 2016-07-05 NOTE — Progress Notes (Signed)
Patient is uncomfortable using contrast with this exam, even after being explained that it is not an IVP Dye or similar.  He would prefer to see his physician and come back if it is clinically indicated.  2D images are very difficult due to body habitus and COPD.    Deliah Boston, RDCS

## 2016-07-05 NOTE — Progress Notes (Unsigned)
Patient is uncomfortable using contrast with this exam, even after being explained that it is not an IVP Dye or similar.  He would prefer to see his physician and come back if it is clinically indicated.  2D images are very difficult due to body habitus and COPD.    Deliah Boston, RDCS

## 2016-07-14 ENCOUNTER — Ambulatory Visit (INDEPENDENT_AMBULATORY_CARE_PROVIDER_SITE_OTHER): Payer: Medicare Other | Admitting: Cardiovascular Disease

## 2016-07-14 ENCOUNTER — Encounter: Payer: Self-pay | Admitting: Cardiovascular Disease

## 2016-07-14 VITALS — BP 114/68 | HR 70 | Ht 70.0 in | Wt 272.1 lb

## 2016-07-14 DIAGNOSIS — I350 Nonrheumatic aortic (valve) stenosis: Secondary | ICD-10-CM | POA: Diagnosis not present

## 2016-07-14 NOTE — Progress Notes (Signed)
Cardiology Office Note Date:  07/14/2016   ID:  Wesley [name], DOB 03/25/1946, MRN [id number]  PCP:  [name], MD  Cardiologist:  [name], MD    Chief Complaint  Patient presents with  . Aortic Stenosis     History of Present Illness: Wesley [name] is a 70 y.o. male who presents for follow-up of aortic valve disease. The patient has longstanding aortic stenosis, previously in the moderate range by echo criteria, presenting for annual follow-up. When I saw him one year ago he was doing very well. He had lost 50# and chronic dyspnea was improved. However, after that visit, he was hospitalized 3 separate times with respiratory failure. In review of notes, it appears he had aspiration pneumonia and diastolic heart failure. He's had no hospitalizations since December 2016.   He has been on home O2 for several years. 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 He has chronic kidney disease followed by Dr Justin Mend.  Last creatinine on file is 2.9 mg/dL.  He is here with his wife today. He is short of breath with short distance walking. He has 4 classic cars and is able to clean and wax his cars, but sits on a stool to do this. Currently on 3L O2 which is his baseline. No CP, leg swelling. No lightheadedness or syncope.   Past Medical History:  Diagnosis Date  . Achalasia   . Aortic stenosis    mod-severe  . Chronic kidney disease    Stage IV  . HTN (hypertension)   . Hypothyroidism   . Morbid obesity ([place])   . Obesity hypoventilation syndrome (H[facility])    Intolerant of NIMV  . Oropharyngeal cancer ([name])   . 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 PTSD (post-traumatic stress disorder)   . Pulmonary hypertension    secondary  . TBI (traumatic brain injury) [facility])     Past Surgical History:  Procedure Laterality Date  . CHONDROPLASTY  11/08/2002   extensive, of the patellofemoral joint  . CHONDROPLASTY  11/08/2002   lesser extent chondroplasty medial femoral condyle  . KNEE ARTHROSCOPY  11/08/2002   right knee examination  . KNEE ARTHROSCOPY W/ PARTIAL MEDIAL MENISCECTOMY  11/08/2002    Current Outpatient Prescriptions  Medication Sig Dispense Refill  . albuterol (PROVENTIL HFA;VENTOLIN HFA) 108 (90 BASE) MCG/ACT inhaler Inhale 2 puffs into the lungs every 6 (six) hours as needed for wheezing or shortness of breath. 1 Inhaler 3  . albuterol (PROVENTIL) (2.5 MG/3ML) 0.083% nebulizer solution Take 2.5 mg by nebulization every 6 (six) hours as needed for wheezing or shortness of breath.     [name] amLODipine (NORVASC) 10 MG tablet Take 10 mg by mouth every morning.     [name] aspirin EC 81 MG tablet Take 81 mg by mouth every morning.    . carboxymethylcellulose (REFRESH) 1 % ophthalmic solution Apply 1 drop to eye 3 (three) times daily.    . cholecalciferol (VITAMIN D) 1000 UNITS tablet Take 1,000 Units by mouth daily.     . ciprofloxacin (CIPRO) 500 MG tablet Take 500 mg by mouth 2 (two) times daily.    [name] docusate sodium (COLACE) 100 MG capsule Take 100 mg by mouth daily.    [name] gabapentin (NEURONTIN) 300 MG capsule Take 300 mg by mouth at bedtime.     [name] galantamine (RAZADYNE ER) 16 MG 24 hr capsule Take  16 mg by mouth at bedtime.     [name] levothyroxine (SYNTHROID, LEVOTHROID) 150 MCG tablet Take 150 mcg by mouth every morning.    . Maltodextrin-Xanthan Gum (RESOURCE THICKENUP CLEAR) POWD Please dispense 1 box ( 1 month supply) 1 Can 1  . metoprolol (LOPRESSOR) 100 MG tablet Take 100 mg by mouth daily as needed (when top number of BP is greater than 140).     . metroNIDAZOLE (FLAGYL) 500 MG tablet Take 500 mg by mouth 2 (two) times daily.    . pantoprazole (PROTONIX) 40 MG tablet Take 1 tablet (40 mg total) by mouth 2 (two) times daily. 60 tablet 0  . pravastatin (PRAVACHOL) 20 MG tablet Take 20 mg by mouth at bedtime.    [name] QUEtiapine (SEROQUEL) 100 MG  tablet Take 100 mg by mouth at bedtime. Take with 25 mg tablet to equal a total dose of 125 mg    . QUEtiapine (SEROQUEL) 25 MG tablet Take 25 mg by mouth at bedtime. Take with 100mg  tablet to equal a total dose of 125 mg    . sertraline (ZOLOFT) 100 MG tablet Take 150 mg by mouth at bedtime.    . Tamsulosin HCl (FLOMAX) 0.4 MG CAPS Take 0.4 mg by mouth daily after breakfast. Once a day     No current facility-administered medications for this visit.     Allergies:   Review of patient's allergies indicates no known allergies.   Social History:  The patient  reports that he has never smoked. He has never used smokeless tobacco. He reports that he does not drink alcohol or use drugs.   Family History:  The patient's  family history includes Aneurysm in his mother. He was adopted.    ROS:  Please see the history of present illness.  Otherwise, review of systems is positive for Abdominal pain.  All other systems are reviewed and negative.    PHYSICAL EXAM: VS:  BP 114/68   Pulse 70   Ht 5\' 10"  (1.778 m)   Wt 123.4 kg (272 lb 1.9 oz)   BMI 39.05 kg/m  , BMI Body mass index is 39.05 kg/m. GEN: Pleasant, obese, chronically ill-appearing male in no acute distress HEENT: normal  Neck: no JVD, no masses. bilateral carotid bruits Cardiac: RRR with grade 3/6 late peaking crescendo decrescendo murmur at the right upper sternal border, absent A2, no diastolic murmur    Respiratory:  Coarse bilateral breath sounds, normal work of breathing GI: soft, nontender, nondistended, + BS, obese MS: no deformity or atrophy  Ext: no pretibial edema Skin: warm and dry, no rash Neuro:  Strength and sensation are intact Psych: euthymic mood, full affect  EKG:  EKG is ordered today. The ekg ordered today shows normal sinus rhythm with sinus arrhythmia, heart rate 70 bpm, left ventricular hypertrophy, ST and T wave abnormality likely repolarization  Recent Labs: 09/01/2015: B Natriuretic Peptide  562.3 09/02/2015: TSH 2.196 09/05/2015: Magnesium 1.9 10/05/2015: ALT 14; BUN 36.3; Creatinine 2.9; Potassium 4.1; Sodium 142 06/22/2016: HGB 10.7; Platelets 225   Lipid Panel  No results found for: CHOL, TRIG, HDL, CHOLHDL, VLDL, LDLCALC, LDLDIRECT    Wt Readings from Last 3 Encounters:  07/14/16 123.4 kg (272 lb 1.9 oz)  05/23/16 125 kg (275 lb 8 oz)  04/01/16 122.5 kg (270 lb)     Cardiac Studies Reviewed: 2D Echo 07-05-2016: Left ventricle:  The cavity size was normal. There was severe concentric hypertrophy. Systolic function was vigorous. The estimated ejection fraction  was in the range of 65% to 70%. Wall motion was normal; there were no regional wall motion abnormalities. Doppler parameters are consistent with abnormal left ventricular relaxation (grade 1 diastolic dysfunction).  ------------------------------------------------------------------- Aortic valve:   Trileaflet; severely thickened, severely calcified leaflets. Valve mobility was restricted.  Doppler:   There was severe stenosis.   There was no regurgitation.    VTI ratio of LVOT to aortic valve: 0.23. Valve area (VTI): 0.72 cm^2. Indexed valve area (VTI): 0.28 cm^2/m^2. Peak velocity ratio of LVOT to aortic valve: 0.27. Valve area (Vmax): 0.83 cm^2. Indexed valve area (Vmax): 0.33 cm^2/m^2. Mean velocity ratio of LVOT to aortic valve: 0.25. Valve area (Vmean): 0.8 cm^2. Indexed valve area (Vmean): 0.31 cm^2/m^2.    Mean gradient (S): 49 mm Hg. Peak gradient (S): 77 mm Hg.  ------------------------------------------------------------------- Aorta:  Ascending aorta: The ascending aorta was mildly dilated.  ------------------------------------------------------------------- Mitral valve:   Structurally normal valve.   Mobility was not restricted.  Doppler:  Transvalvular velocity was within the normal range. There was no evidence for stenosis. There was no regurgitation.    Peak gradient (D): 2 mm  Hg.  ------------------------------------------------------------------- Left atrium:  The atrium was moderately dilated.  ------------------------------------------------------------------- Right ventricle:  The cavity size was normal. Wall thickness was normal. Systolic function was normal.  ------------------------------------------------------------------- Pulmonic valve:    Doppler:  Transvalvular velocity was within the normal range. There was no evidence for stenosis.  ------------------------------------------------------------------- Tricuspid valve:   Structurally normal valve.    Doppler: Transvalvular velocity was within the normal range. There was no regurgitation.  ------------------------------------------------------------------- Pulmonary artery:   The main pulmonary artery was normal-sized. Systolic pressure was within the normal range.  ------------------------------------------------------------------- Right atrium:  The atrium was normal in size.  ------------------------------------------------------------------- Pericardium:  There was no pericardial effusion.  ------------------------------------------------------------------- Systemic veins: Inferior vena cava: The vessel was normal in size. The respirophasic diameter changes were blunted (< 50%), consistent with elevated central venous pressure. Diameter: 21.2 mm.  ------------------------------------------------------------------- Measurements   IVC                                       Value          Reference  ID                                        21.2  mm       ---------    Left ventricle                            Value          Reference  LV ID, ED, PLAX chordal                   45.3  mm       43 - 52  LV ID, ES, PLAX chordal                   32.3  mm       23 - 38  LV fx shortening, PLAX chordal            29    %        >=29  LV PW thickness, ED  17.9   mm       ---------  IVS/LV PW ratio, ED                       0.96           <=1.3  Stroke volume, 2D                         91    ml       ---------  Stroke volume/bsa, 2D                     36    ml/m^2   ---------  LV e&', lateral                            6.74  cm/s     ---------  LV E/e&', lateral                          11.16          ---------  LV e&', medial                             5.11  cm/s     ---------  LV E/e&', medial                           14.72          ---------  LV e&', average                            5.93  cm/s     ---------  LV E/e&', average                          12.69          ---------    Ventricular septum                        Value          Reference  IVS thickness, ED                         17.2  mm       ---------    LVOT                                      Value          Reference  LVOT ID, S                                20    mm       ---------  LVOT area                                 3.14  cm^2     ---------  LVOT peak velocity, S  117   cm/s     ---------  LVOT mean velocity, S                     83.4  cm/s     ---------  LVOT VTI, S                               29    cm       ---------  LVOT peak gradient, S                     5     mm Hg    ---------    Aortic valve                              Value          Reference  Aortic valve peak velocity, S             440   cm/s     ---------  Aortic valve mean velocity, S             329   cm/s     ---------  Aortic valve VTI, S                       127   cm       ---------  Aortic mean gradient, S                   49    mm Hg    ---------  Aortic peak gradient, S                   77    mm Hg    ---------  VTI ratio, LVOT/AV                        0.23           ---------  Aortic valve area, VTI                    0.72  cm^2     ---------  Aortic valve area/bsa, VTI                0.28  cm^2/m^2 ---------  Velocity ratio, peak, LVOT/AV             0.27            ---------  Aortic valve area, peak velocity          0.83  cm^2     ---------  Aortic valve area/bsa, peak               0.33  cm^2/m^2 ---------  velocity  Velocity ratio, mean, LVOT/AV             0.25           ---------  Aortic valve area, mean velocity          0.8   cm^2     ---------  Aortic valve area/bsa, mean               0.31  cm^2/m^2 ---------  velocity    Aorta  Value          Reference  Aortic root ID, ED                        42    mm       ---------  Ascending aorta ID, A-P, S                42    mm       ---------    Left atrium                               Value          Reference  LA ID, A-P, ES                            51    mm       ---------  LA ID/bsa, A-P                            2.01  cm/m^2   <=2.2    Mitral valve                              Value          Reference  Mitral E-wave peak velocity               75.2  cm/s     ---------  Mitral A-wave peak velocity               93.6  cm/s     ---------  Mitral deceleration time          (H)     239   ms       150 - 230  Mitral peak gradient, D                   2     mm Hg    ---------  Mitral E/A ratio, peak                    0.8            ---------    Tricuspid valve                           Value          Reference  Tricuspid regurg peak velocity            311   cm/s     ---------  Tricuspid peak RV-RA gradient             39    mm Hg    ---------    Right ventricle                           Value          Reference  TAPSE                                     24.5  mm       ---------  RV s&', lateral,  S                         9.14  cm/s     ---------  ASSESSMENT AND PLAN: 1.  Severe, stage D, symptomatic aortic stenosis 2. Anemia of chronic renal disease: The patient is treated with Aranesp. His labs are reviewed and hemoglobin appears stable. 3. Chronic kidney disease stage IV: Has been stable over time. Followed by Dr. Justin Mend. 4. Chronic respiratory  failure, dependent  The patient is medically complex. He was hospitalized 3 times in November and December last year with acute on chronic respiratory failure. There was clearly a component of volume overload and diastolic heart failure based on review of records. He has essentially back to his baseline now, but remains limited by New [place] Heart Association functional class III symptoms of chronic diastolic heart failure. The patient's aortic stenosis has progressed based on echo findings. His LV function remains normal. I have personally reviewed his echo images which demonstrate severe calcification and restriction of his aortic valve leaflets with hemodynamic findings diagnostic of severe aortic stenosis. We have discussed the natural history of aortic stenosis as well as treatment options in the context of his specific medical problems. I have discussed his case with Dr. Justin Mend.  The patient has a poor prognosis without intervention on his aortic valve stenosis. It is highly unlikely that he would be a candidate for conventional heart surgery. TAVR may be a reasonable treatment option. His evaluation would require 3 separate procedures with ionated contrast (cardiac catheterization, CTA of the chest, abdomen, and pelvis, and gated cardiac CTA). We reviewed risks associated with this evaluation, including the possibility of acute kidney injury and progression to end-stage renal disease. The patient and his wife are counseled at length. They wish to proceed with evaluation and treatment.  Cardiac catheterization will be arranged for hemodynamic assessment and coronary artery assessment. The patient will be admitted for IV fluid administration prior to the procedure.  I have reviewed the risks, indications, and alternatives to cardiac catheterization, possible angioplasty, and stenting with the patient. Risks include but are not limited to bleeding, infection, vascular injury, stroke, myocardial infection,  arrhythmia, kidney injury, radiation-related injury in the case of prolonged fluoroscopy use, emergency cardiac surgery, and death. The patient understands the risks of serious complication is 1-2 in [id number] with diagnostic cardiac cath and 1-2% or less with angioplasty/stenting.   Following cardiac catheterization I would anticipate referral to cardiac surgery for formal outpatient consultation.  Current medicines are reviewed with the patient today.  The patient does not have concerns regarding medicines.  Labs/ tests ordered today include:  No orders of the defined types were placed in this encounter.   Disposition:   FU pending cath results  Signed, [name], MD  07/14/2016 3:23 PM    [address], [place], [place]  [address] Phone: [phone]; Fax: [phone]

## 2016-07-14 NOTE — Patient Instructions (Addendum)
Medication Instructions:  Your physician recommends that you continue on your current medications as directed. Please refer to the Current Medication list given to you today.  Labwork: No new orders.   Testing/Procedures: Your physician has requested that you have a cardiac catheterization on 08/07/2016.  We will plan to admit you from home the day before, 07/27/2016.  Cardiac catheterization is used to diagnose and/or treat various heart conditions. Doctors may recommend this procedure for a number of different reasons. The most common reason is to evaluate chest pain. Chest pain can be a symptom of coronary artery disease (CAD), and cardiac catheterization can show whether plaque is narrowing or blocking your heart's arteries. This procedure is also used to evaluate the valves, as well as measure the blood flow and oxygen levels in different parts of your heart. For further information please visit [email or web address]. Please follow instruction sheet, as given.  Follow-Up: We will arrange further follow-up after cardiac catheterization.   Any Other Special Instructions Will Be Listed Below (If Applicable).     If you need a refill on your cardiac medications before your next appointment, please call your pharmacy.

## 2016-07-15 ENCOUNTER — Telehealth: Payer: Self-pay | Admitting: Pulmonary Disease

## 2016-07-15 NOTE — Telephone Encounter (Signed)
Nothing additional to add at this time.  Please schedule for ROV to assess pulmonary status prior to valvular heart surgery.

## 2016-07-15 NOTE — Telephone Encounter (Signed)
Called and spoke to pt's wife, Wesley [name]. Wesley [name] states the pt saw Dr. Burt Knack on 11.2.17 and was informed that his aortic valve stenosis has changed from moderate to severe. Wesley [name] stated Dr. Burt Knack informed him after the cardiac cath, that has already been scheduled, the pt will then need an aortic valve replacement. Wesley [name] is requesting the input of the pt's pulmonary status and risk of respiratory complications, advised Wesley [name] that once the cardiac cath has been completed we can schedule a visit with VS to address sx clearance. Wesley [name] verbalized understanding.  Dr. Halford Chessman, please advise if there is anything in the meantime to inform the pt about pulmonary status.

## 2016-07-15 NOTE — Telephone Encounter (Signed)
Spoke with pt's wife, pt scheduled for OV next week with VS.   Nothing further needed at this time.

## 2016-07-18 ENCOUNTER — Ambulatory Visit: Payer: Medicare Other

## 2016-07-18 ENCOUNTER — Other Ambulatory Visit: Payer: Medicare Other

## 2016-07-18 ENCOUNTER — Other Ambulatory Visit: Payer: Self-pay

## 2016-07-18 DIAGNOSIS — I350 Nonrheumatic aortic (valve) stenosis: Secondary | ICD-10-CM

## 2016-07-19 ENCOUNTER — Encounter: Payer: Self-pay | Admitting: Pulmonary Disease

## 2016-07-19 ENCOUNTER — Ambulatory Visit (INDEPENDENT_AMBULATORY_CARE_PROVIDER_SITE_OTHER): Payer: Medicare Other | Admitting: Pulmonary Disease

## 2016-07-19 VITALS — BP 150/68 | HR 69 | Ht 70.0 in | Wt 270.8 lb

## 2016-07-19 DIAGNOSIS — E662 Morbid (severe) obesity with alveolar hypoventilation: Secondary | ICD-10-CM

## 2016-07-19 DIAGNOSIS — J9611 Chronic respiratory failure with hypoxia: Secondary | ICD-10-CM

## 2016-07-19 NOTE — Patient Instructions (Signed)
Follow up in 2 months

## 2016-07-19 NOTE — Progress Notes (Signed)
Current Outpatient Prescriptions on File Prior to Visit  Medication Sig  . albuterol (PROVENTIL HFA;VENTOLIN HFA) 108 (90 BASE) MCG/ACT inhaler Inhale 2 puffs into the lungs every 6 (six) hours as needed for wheezing or shortness of breath.  [name] albuterol (PROVENTIL) (2.5 MG/3ML) 0.083% nebulizer solution Take 2.5 mg by nebulization every 6 (six) hours as needed for wheezing or shortness of breath.   [name] amLODipine (NORVASC) 10 MG tablet Take 10 mg by mouth every morning.   [name] aspirin EC 81 MG tablet Take 81 mg by mouth every morning.  . carboxymethylcellulose (REFRESH) 1 % ophthalmic solution Apply 1 drop to eye daily as needed (dry eyes).   . cholecalciferol (VITAMIN D) 1000 UNITS tablet Take 1,000 Units by mouth daily.   . ciprofloxacin (CIPRO) 500 MG tablet Take 500 mg by mouth 2 (two) times daily.  [name] docusate sodium (COLACE) 100 MG capsule Take 100 mg by mouth daily.  . furosemide (LASIX) 20 MG tablet Take 20 mg by mouth daily as needed for edema.   . gabapentin (NEURONTIN) 300 MG capsule Take 300 mg by mouth at bedtime.   [name] galantamine (RAZADYNE ER) 16 MG 24 hr capsule Take 16 mg by mouth at bedtime.   [name] levothyroxine (SYNTHROID, LEVOTHROID) 150 MCG tablet Take 150 mcg by mouth every morning.  . Maltodextrin-Xanthan Gum (RESOURCE THICKENUP CLEAR) POWD Please dispense 1 box ( 1 month supply)  . metoprolol (LOPRESSOR) 100 MG tablet Take 100 mg by mouth daily as needed (when top number of BP is greater than 140).   . metroNIDAZOLE (FLAGYL) 500 MG tablet Take 500 mg by mouth 2 (two) times daily.  . pantoprazole (PROTONIX) 40 MG tablet Take 1 tablet (40 mg total) by mouth 2 (two) times daily.  . pravastatin (PRAVACHOL) 20 MG tablet Take 20 mg by mouth at bedtime.  [name] QUEtiapine (SEROQUEL) 50 MG tablet Take 150 mg by mouth at bedtime.  . sertraline (ZOLOFT) 100 MG tablet Take 150 mg by mouth at bedtime.  . Tamsulosin HCl (FLOMAX) 0.4 MG CAPS Take 0.4 mg by mouth daily after breakfast. Once a day    No current facility-administered medications on file prior to visit.      Chief Complaint  Patient presents with  . Follow-up    Denies any increased breathing issues since last OV. Pt denies any questions or concerns. Still using 3 liters O2     Pulmonary tests V/Q scan 05/22/14 >> low probability for PE  Sleep tests PSG 06/03/13 >> AHI 5, SpO2 low 82%, REM 16. 101 min with SpO2 < 88%. Needed 1 liter oxygen. ONO with RA 06/05/13 >> Test time 8 hrs 31 min. Mean SpO2 84%, low SpO2 59%. Spent 8 hrs 1 min with SpO2 < 88%. ONO with 2 liters 06/27/13 >> Test time 7 hrs 25 min. Basal SpO2 91.9%, low SpO2 76%. Spent 11 min with SpO2 < 88%.  Cardiac tests Echo 09/02/15 >> EF 60 to [id number], grade 2 diastolic dysfx, severe AS  Past medical history Aortic stenosis, HTN, HLD, Achalasia, Hiatal hernia, PSTD, TBI, Hypothyroidism, stage IV CKD, Lt tonsil oropharyngeal cancer (squamous cell) 2011  Past surgical history, Family history, Social history, Allergies  Vital Signs BP (!) 150/68 (BP Location: Left Arm, Cuff Size: Normal)   Pulse 69   Ht 5\' 10"  (1.778 m)   Wt 270 lb 12.8 oz (122.8 kg)   SpO2 93%   BMI 38.86 kg/m   History of Present Illness Wesley [name] is  a 70 y.o. male with chronic respiratory failure 2nd to OSA/OHS and recurrent aspiration pneumonia.  He also has hx of severe aortic stenosis.  He was seen recently by Dr. Burt Knack.  He was noted to have progression of his aortic stenosis.  He gets fatigued easily with activity.  His breathing is okay at rest.  He is not having cough, sputum, or wheeze.  His weight has been stable.  He continues to use supplemental oxygen.  Physical Exam  General - pleasant  ENT - No sinus tenderness, no oral exudate, no LAN Cardiac - s1s2 regular, 4/6 SM Chest - decreased BS, no wheeze Back - No focal tenderness Abd - Soft, non-tender Ext - 2+ edema Neuro - Normal strength Skin - No rashes Psych - normal mood, and behavior   Wt Readings  from Last 5 Encounters:  07/19/16 270 lb 12.8 oz (122.8 kg)  07/14/16 272 lb 1.9 oz (123.4 kg)  05/23/16 275 lb 8 oz (125 kg)  04/01/16 270 lb (122.5 kg)  11/23/15 271 lb 12.8 oz (123.3 kg)    CMP Latest Ref Rng & Units 10/05/2015 09/07/2015 09/04/2015  Glucose 70 - 140 mg/dl 115 93 104(H)  BUN 7.0 - 26.0 mg/dL 36.3(H) 47(H) 57(H)  Creatinine 0.7 - 1.3 mg/dL 2.9(H) 2.80(H) 2.69(H)  Sodium 136 - 145 mEq/L 142 142 147(H)  Potassium 3.5 - 5.1 mEq/L 4.1 4.8 4.7  Chloride 101 - 111 mmol/L - 95(L) 96(L)  CO2 22 - 29 mEq/L 36(H) 38(H) 40(H)  Calcium 8.4 - 10.4 mg/dL 9.3 8.9 8.8(L)  Total Protein 6.4 - 8.3 g/dL 7.5 - -  Total Bilirubin 0.20 - 1.20 mg/dL 0.36 - -  Alkaline Phos 40 - 150 U/L 109 - -  AST 5 - 34 U/L 18 - -  ALT 0 - 55 U/L 14 - -    CBC Latest Ref Rng & Units 06/22/2016 05/23/2016 04/25/2016  WBC 4.0 - 10.3 10e3/uL 15.4(H) 9.2 7.4  Hemoglobin 13.0 - 17.1 g/dL 10.7(L) 10.0(L) 10.9(L)  Hematocrit 38.4 - 49.9 % 35.4(L) 33.2(L) 35.1(L)  Platelets 140 - 400 10e3/uL 225 236 189    Assessment/Plan  Chronic respiratory failure with hypoxia/hypercapnia for obesity hypoventilation syndrome. - continue 2 to 4 liters oxygen with exertion and sleep - explained that we could revisit home oxygen needs if he continues to lose weight  Obstructive sleep apnea. 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 - continue weight loss efforts  Dysphagia with recurrent aspiration pneumonia. - continue D3 diet  Aortic stenosis. - he has progression of this, and it appears this is impact his quality of life and limiting his exercise tolerance - he understands the risks involved with TAVR - he can proceed with the procedure - of note is that he has been difficult airway in the past, and anesthesia should be notified of such; I have added this to his past medical history - PCCM can be available as needed while he is in  hospital    Patient Instructions  Follow up in 2 months    [name], MD [facility] Pager:  [phone] 07/19/2016, 5:04 PM

## 2016-07-20 ENCOUNTER — Encounter: Payer: Self-pay | Admitting: *Deleted

## 2016-07-20 ENCOUNTER — Other Ambulatory Visit: Payer: Self-pay | Admitting: *Deleted

## 2016-07-20 ENCOUNTER — Ambulatory Visit (HOSPITAL_BASED_OUTPATIENT_CLINIC_OR_DEPARTMENT_OTHER): Payer: Medicare Other

## 2016-07-20 ENCOUNTER — Other Ambulatory Visit (HOSPITAL_BASED_OUTPATIENT_CLINIC_OR_DEPARTMENT_OTHER): Payer: Medicare Other

## 2016-07-20 VITALS — BP 124/61 | HR 81 | Temp 98.0°F | Resp 18

## 2016-07-20 DIAGNOSIS — N184 Chronic kidney disease, stage 4 (severe): Secondary | ICD-10-CM

## 2016-07-20 DIAGNOSIS — D638 Anemia in other chronic diseases classified elsewhere: Secondary | ICD-10-CM

## 2016-07-20 DIAGNOSIS — D631 Anemia in chronic kidney disease: Secondary | ICD-10-CM

## 2016-07-20 DIAGNOSIS — I350 Nonrheumatic aortic (valve) stenosis: Secondary | ICD-10-CM

## 2016-07-20 DIAGNOSIS — N189 Chronic kidney disease, unspecified: Secondary | ICD-10-CM

## 2016-07-20 DIAGNOSIS — N289 Disorder of kidney and ureter, unspecified: Secondary | ICD-10-CM

## 2016-07-20 LAB — CBC & DIFF AND RETIC
BASO%: 0.7 % (ref 0.0–2.0)
Basophils Absolute: 0.1 10*3/uL (ref 0.0–0.1)
EOS ABS: 0.3 10*3/uL (ref 0.0–0.5)
EOS%: 3.3 % (ref 0.0–7.0)
HCT: 31 % — ABNORMAL LOW (ref 38.4–49.9)
HEMOGLOBIN: 9.2 g/dL — AB (ref 13.0–17.1)
IMMATURE RETIC FRACT: 12.2 % — AB (ref 3.00–10.60)
LYMPH%: 16.3 % (ref 14.0–49.0)
MCH: 25.9 pg — AB (ref 27.2–33.4)
MCHC: 29.7 g/dL — ABNORMAL LOW (ref 32.0–36.0)
MCV: 87.3 fL (ref 79.3–98.0)
MONO#: 0.5 10*3/uL (ref 0.1–0.9)
MONO%: 7.2 % (ref 0.0–14.0)
NEUT#: 5.5 10*3/uL (ref 1.5–6.5)
NEUT%: 72.5 % (ref 39.0–75.0)
Platelets: 209 10*3/uL (ref 140–400)
RBC: 3.55 10*6/uL — ABNORMAL LOW (ref 4.20–5.82)
RDW: 15.7 % — AB (ref 11.0–14.6)
RETIC %: 1.27 % (ref 0.80–1.80)
Retic Ct Abs: 45.09 10*3/uL (ref 34.80–93.90)
WBC: 7.5 10*3/uL (ref 4.0–10.3)
lymph#: 1.2 10*3/uL (ref 0.9–3.3)

## 2016-07-20 MED ORDER — DARBEPOETIN ALFA 200 MCG/0.4ML IJ SOSY
200.0000 ug | PREFILLED_SYRINGE | Freq: Once | INTRAMUSCULAR | Status: AC
  Administered 2016-07-20: 200 ug via SUBCUTANEOUS
  Filled 2016-07-20: qty 0.4

## 2016-07-20 NOTE — Patient Instructions (Signed)
Darbepoetin Alfa injection What is this medicine? DARBEPOETIN ALFA (dar be POE e tin AL fa) helps your body make more red blood cells. It is used to treat anemia caused by chronic kidney failure and chemotherapy. This medicine may be used for other purposes; ask your health care provider or pharmacist if you have questions. What should I tell my health care provider before I take this medicine? They need to know if you have any of these conditions: -blood clotting disorders or history of blood clots -cancer patient not on chemotherapy -cystic fibrosis -heart disease, such as angina, heart failure, or a history of a heart attack -hemoglobin level of 12 g/dL or greater -high blood pressure -low levels of folate, iron, or vitamin B12 -seizures -an unusual or allergic reaction to darbepoetin, erythropoietin, albumin, hamster proteins, latex, other medicines, foods, dyes, or preservatives -pregnant or trying to get pregnant -breast-feeding How should I use this medicine? This medicine is for injection into a vein or under the skin. It is usually given by a health care professional in a hospital or clinic setting. If you get this medicine at home, you will be taught how to prepare and give this medicine. Do not shake the solution before you withdraw a dose. Use exactly as directed. Take your medicine at regular intervals. Do not take your medicine more often than directed. It is important that you put your used needles and syringes in a special sharps container. Do not put them in a trash can. If you do not have a sharps container, call your pharmacist or healthcare provider to get one. Talk to your pediatrician regarding the use of this medicine in children. While this medicine may be used in children as young as 1 year for selected conditions, precautions do apply. Overdosage: If you think you have taken too much of this medicine contact a poison control center or emergency room at once. NOTE:  This medicine is only for you. Do not share this medicine with others. What if I miss a dose? If you miss a dose, take it as soon as you can. If it is almost time for your next dose, take only that dose. Do not take double or extra doses. What may interact with this medicine? Do not take this medicine with any of the following medications: -epoetin alfa This list may not describe all possible interactions. Give your health care provider a list of all the medicines, herbs, non-prescription drugs, or dietary supplements you use. Also tell them if you smoke, drink alcohol, or use illegal drugs. Some items may interact with your medicine. What should I watch for while using this medicine? Visit your prescriber or health care professional for regular checks on your progress and for the needed blood tests and blood pressure measurements. It is especially important for the doctor to make sure your hemoglobin level is in the desired range, to limit the risk of potential side effects and to give you the best benefit. Keep all appointments for any recommended tests. Check your blood pressure as directed. Ask your doctor what your blood pressure should be and when you should contact him or her. As your body makes more red blood cells, you may need to take iron, folic acid, or vitamin B supplements. Ask your doctor or health care provider which products are right for you. If you have kidney disease continue dietary restrictions, even though this medication can make you feel better. Talk with your doctor or health care professional about the   foods you eat and the vitamins that you take. What side effects may I notice from receiving this medicine? Side effects that you should report to your doctor or health care professional as soon as possible: -allergic reactions like skin rash, itching or hives, swelling of the face, lips, or tongue -breathing problems -changes in vision -chest pain -confusion, trouble speaking  or understanding -feeling faint or lightheaded, falls -high blood pressure -muscle aches or pains -pain, swelling, warmth in the leg -rapid weight gain -severe headaches -sudden numbness or weakness of the face, arm or leg -trouble walking, dizziness, loss of balance or coordination -seizures (convulsions) -swelling of the ankles, feet, hands -unusually weak or tired Side effects that usually do not require medical attention (report to your doctor or health care professional if they continue or are bothersome): -diarrhea -fever, chills (flu-like symptoms) -headaches -nausea, vomiting -redness, stinging, or swelling at site where injected This list may not describe all possible side effects. Call your doctor for medical advice about side effects. You may report side effects to FDA at 1-800-FDA-1088. Where should I keep my medicine? Keep out of the reach of children. Store in a refrigerator between 2 and 8 degrees C (36 and 46 degrees F). Do not freeze. Do not shake. Throw away any unused portion if using a single-dose vial. Throw away any unused medicine after the expiration date. NOTE: This sheet is a summary. It may not cover all possible information. If you have questions about this medicine, talk to your doctor, pharmacist, or health care provider.    2016, Elsevier/Gold Standard. (2008-08-12 10:23:57)  

## 2016-07-20 NOTE — Progress Notes (Signed)
Wife brought in Living Will and HCPOA to office today.  I made copies and gave them to HIM dept to scan into EMR.

## 2016-07-21 ENCOUNTER — Telehealth: Payer: Self-pay | Admitting: *Deleted

## 2016-07-21 NOTE — Telephone Encounter (Signed)
He has received his injection yesterday, may take 1-2 weeks to work. Shouldn't be a problem for heart cath.  He has return appt again in Dec before heart surgery and we will re-evaluate

## 2016-07-21 NOTE — Telephone Encounter (Signed)
Informed wife of Dr. Calton Dach message. She verbalized understanding.

## 2016-07-21 NOTE — Telephone Encounter (Signed)
Wife states she is concerned about pt's lab results "being low."    Pt is having Cardiac Cath this Monday 11/13 and planned "Heart Surgery"  For 08/23/16.

## 2016-07-24 ENCOUNTER — Inpatient Hospital Stay (HOSPITAL_COMMUNITY)
Admission: RE | Admit: 2016-07-24 | Discharge: 2016-08-12 | DRG: 264 | Disposition: E | Payer: Medicare Other | Source: Ambulatory Visit | Attending: Pulmonary Disease | Admitting: Pulmonary Disease

## 2016-07-24 ENCOUNTER — Emergency Department (HOSPITAL_COMMUNITY): Admission: EM | Admit: 2016-07-24 | Payer: Medicare Other | Source: Home / Self Care | Admitting: Cardiovascular Disease

## 2016-07-24 DIAGNOSIS — D631 Anemia in chronic kidney disease: Secondary | ICD-10-CM | POA: Diagnosis present

## 2016-07-24 DIAGNOSIS — E662 Morbid (severe) obesity with alveolar hypoventilation: Secondary | ICD-10-CM | POA: Diagnosis present

## 2016-07-24 DIAGNOSIS — I10 Essential (primary) hypertension: Secondary | ICD-10-CM | POA: Diagnosis present

## 2016-07-24 DIAGNOSIS — I5033 Acute on chronic diastolic (congestive) heart failure: Secondary | ICD-10-CM

## 2016-07-24 DIAGNOSIS — R338 Other retention of urine: Secondary | ICD-10-CM | POA: Diagnosis present

## 2016-07-24 DIAGNOSIS — J9690 Respiratory failure, unspecified, unspecified whether with hypoxia or hypercapnia: Secondary | ICD-10-CM

## 2016-07-24 DIAGNOSIS — J9600 Acute respiratory failure, unspecified whether with hypoxia or hypercapnia: Secondary | ICD-10-CM

## 2016-07-24 DIAGNOSIS — I350 Nonrheumatic aortic (valve) stenosis: Secondary | ICD-10-CM | POA: Diagnosis not present

## 2016-07-24 DIAGNOSIS — D72829 Elevated white blood cell count, unspecified: Secondary | ICD-10-CM | POA: Diagnosis present

## 2016-07-24 DIAGNOSIS — N401 Enlarged prostate with lower urinary tract symptoms: Secondary | ICD-10-CM | POA: Diagnosis present

## 2016-07-24 DIAGNOSIS — Z79899 Other long term (current) drug therapy: Secondary | ICD-10-CM

## 2016-07-24 DIAGNOSIS — Z9911 Dependence on respirator [ventilator] status: Secondary | ICD-10-CM

## 2016-07-24 DIAGNOSIS — F329 Major depressive disorder, single episode, unspecified: Secondary | ICD-10-CM | POA: Diagnosis present

## 2016-07-24 DIAGNOSIS — Z452 Encounter for adjustment and management of vascular access device: Secondary | ICD-10-CM

## 2016-07-24 DIAGNOSIS — K220 Achalasia of cardia: Secondary | ICD-10-CM | POA: Diagnosis present

## 2016-07-24 DIAGNOSIS — Z9221 Personal history of antineoplastic chemotherapy: Secondary | ICD-10-CM

## 2016-07-24 DIAGNOSIS — G253 Myoclonus: Secondary | ICD-10-CM | POA: Diagnosis not present

## 2016-07-24 DIAGNOSIS — J690 Pneumonitis due to inhalation of food and vomit: Secondary | ICD-10-CM | POA: Diagnosis present

## 2016-07-24 DIAGNOSIS — J189 Pneumonia, unspecified organism: Secondary | ICD-10-CM

## 2016-07-24 DIAGNOSIS — R259 Unspecified abnormal involuntary movements: Secondary | ICD-10-CM

## 2016-07-24 DIAGNOSIS — J80 Acute respiratory distress syndrome: Secondary | ICD-10-CM | POA: Diagnosis not present

## 2016-07-24 DIAGNOSIS — R404 Transient alteration of awareness: Secondary | ICD-10-CM

## 2016-07-24 DIAGNOSIS — E872 Acidosis, unspecified: Secondary | ICD-10-CM | POA: Diagnosis present

## 2016-07-24 DIAGNOSIS — Z7982 Long term (current) use of aspirin: Secondary | ICD-10-CM

## 2016-07-24 DIAGNOSIS — M549 Dorsalgia, unspecified: Secondary | ICD-10-CM | POA: Diagnosis present

## 2016-07-24 DIAGNOSIS — F0390 Unspecified dementia without behavioral disturbance: Secondary | ICD-10-CM | POA: Diagnosis present

## 2016-07-24 DIAGNOSIS — R262 Difficulty in walking, not elsewhere classified: Secondary | ICD-10-CM

## 2016-07-24 DIAGNOSIS — N189 Chronic kidney disease, unspecified: Secondary | ICD-10-CM | POA: Diagnosis present

## 2016-07-24 DIAGNOSIS — I5032 Chronic diastolic (congestive) heart failure: Secondary | ICD-10-CM | POA: Diagnosis present

## 2016-07-24 DIAGNOSIS — N179 Acute kidney failure, unspecified: Secondary | ICD-10-CM

## 2016-07-24 DIAGNOSIS — I4901 Ventricular fibrillation: Secondary | ICD-10-CM | POA: Diagnosis not present

## 2016-07-24 DIAGNOSIS — Z87820 Personal history of traumatic brain injury: Secondary | ICD-10-CM

## 2016-07-24 DIAGNOSIS — J9622 Acute and chronic respiratory failure with hypercapnia: Secondary | ICD-10-CM

## 2016-07-24 DIAGNOSIS — I959 Hypotension, unspecified: Secondary | ICD-10-CM | POA: Diagnosis not present

## 2016-07-24 DIAGNOSIS — R1312 Dysphagia, oropharyngeal phase: Secondary | ICD-10-CM | POA: Diagnosis present

## 2016-07-24 DIAGNOSIS — I639 Cerebral infarction, unspecified: Secondary | ICD-10-CM

## 2016-07-24 DIAGNOSIS — J810 Acute pulmonary edema: Secondary | ICD-10-CM

## 2016-07-24 DIAGNOSIS — E039 Hypothyroidism, unspecified: Secondary | ICD-10-CM | POA: Diagnosis present

## 2016-07-24 DIAGNOSIS — J9601 Acute respiratory failure with hypoxia: Secondary | ICD-10-CM

## 2016-07-24 DIAGNOSIS — Z923 Personal history of irradiation: Secondary | ICD-10-CM

## 2016-07-24 DIAGNOSIS — J9611 Chronic respiratory failure with hypoxia: Secondary | ICD-10-CM

## 2016-07-24 DIAGNOSIS — G4733 Obstructive sleep apnea (adult) (pediatric): Secondary | ICD-10-CM | POA: Diagnosis present

## 2016-07-24 DIAGNOSIS — Z4659 Encounter for fitting and adjustment of other gastrointestinal appliance and device: Secondary | ICD-10-CM

## 2016-07-24 DIAGNOSIS — T17990A Other foreign object in respiratory tract, part unspecified in causing asphyxiation, initial encounter: Secondary | ICD-10-CM | POA: Diagnosis not present

## 2016-07-24 DIAGNOSIS — Z85818 Personal history of malignant neoplasm of other sites of lip, oral cavity, and pharynx: Secondary | ICD-10-CM

## 2016-07-24 DIAGNOSIS — I2510 Atherosclerotic heart disease of native coronary artery without angina pectoris: Secondary | ICD-10-CM | POA: Diagnosis present

## 2016-07-24 DIAGNOSIS — J9621 Acute and chronic respiratory failure with hypoxia: Secondary | ICD-10-CM | POA: Diagnosis present

## 2016-07-24 DIAGNOSIS — N184 Chronic kidney disease, stage 4 (severe): Secondary | ICD-10-CM | POA: Diagnosis present

## 2016-07-24 DIAGNOSIS — J449 Chronic obstructive pulmonary disease, unspecified: Secondary | ICD-10-CM | POA: Diagnosis present

## 2016-07-24 DIAGNOSIS — G9341 Metabolic encephalopathy: Secondary | ICD-10-CM | POA: Diagnosis not present

## 2016-07-24 DIAGNOSIS — J9612 Chronic respiratory failure with hypercapnia: Secondary | ICD-10-CM

## 2016-07-24 DIAGNOSIS — Z9981 Dependence on supplemental oxygen: Secondary | ICD-10-CM

## 2016-07-24 DIAGNOSIS — Z6839 Body mass index (BMI) 39.0-39.9, adult: Secondary | ICD-10-CM

## 2016-07-24 DIAGNOSIS — R0602 Shortness of breath: Secondary | ICD-10-CM

## 2016-07-24 DIAGNOSIS — Z978 Presence of other specified devices: Secondary | ICD-10-CM

## 2016-07-24 DIAGNOSIS — I130 Hypertensive heart and chronic kidney disease with heart failure and stage 1 through stage 4 chronic kidney disease, or unspecified chronic kidney disease: Secondary | ICD-10-CM | POA: Diagnosis present

## 2016-07-24 DIAGNOSIS — G8929 Other chronic pain: Secondary | ICD-10-CM | POA: Diagnosis present

## 2016-07-24 DIAGNOSIS — K5900 Constipation, unspecified: Secondary | ICD-10-CM | POA: Diagnosis present

## 2016-07-24 DIAGNOSIS — F4310 Post-traumatic stress disorder, unspecified: Secondary | ICD-10-CM | POA: Diagnosis present

## 2016-07-24 DIAGNOSIS — R0902 Hypoxemia: Secondary | ICD-10-CM | POA: Diagnosis present

## 2016-07-24 HISTORY — DX: Adverse effect of unspecified anesthetic, initial encounter: T41.45XA

## 2016-07-24 HISTORY — DX: Chronic respiratory failure with hypoxia: J96.11

## 2016-07-24 HISTORY — DX: Essential (primary) hypertension: I10

## 2016-07-24 HISTORY — DX: Chronic kidney disease, unspecified: N18.9

## 2016-07-24 HISTORY — DX: Other complications of anesthesia, initial encounter: T88.59XA

## 2016-07-24 HISTORY — DX: Failed or difficult intubation, initial encounter: T88.4XXA

## 2016-07-24 HISTORY — DX: Anemia in chronic kidney disease: D63.1

## 2016-07-24 HISTORY — DX: Chronic diastolic (congestive) heart failure: I50.32

## 2016-07-24 HISTORY — DX: Chronic kidney disease, stage 4 (severe): N18.4

## 2016-07-24 HISTORY — DX: Depression, unspecified: F32.A

## 2016-07-24 HISTORY — DX: Dyskinesia of esophagus: K22.4

## 2016-07-24 HISTORY — DX: Major depressive disorder, single episode, unspecified: F32.9

## 2016-07-24 LAB — COMPREHENSIVE METABOLIC PANEL
ALBUMIN: 3.4 g/dL — AB (ref 3.5–5.0)
ALK PHOS: 75 U/L (ref 38–126)
ALT: 9 U/L — AB (ref 17–63)
ANION GAP: 10 (ref 5–15)
AST: 14 U/L — ABNORMAL LOW (ref 15–41)
BUN: 64 mg/dL — ABNORMAL HIGH (ref 6–20)
CALCIUM: 9.7 mg/dL (ref 8.9–10.3)
CHLORIDE: 97 mmol/L — AB (ref 101–111)
CO2: 32 mmol/L (ref 22–32)
CREATININE: 3.86 mg/dL — AB (ref 0.61–1.24)
GFR calc non Af Amer: 14 mL/min — ABNORMAL LOW (ref >60)
GFR, EST AFRICAN AMERICAN: 17 mL/min — AB (ref >60)
GLUCOSE: 129 mg/dL — AB (ref 65–99)
Potassium: 4.4 mmol/L (ref 3.5–5.1)
SODIUM: 139 mmol/L (ref 135–145)
Total Bilirubin: 0.4 mg/dL (ref 0.3–1.2)
Total Protein: 6.9 g/dL (ref 6.5–8.1)

## 2016-07-24 LAB — CBC WITH DIFFERENTIAL/PLATELET
BASOS PCT: 0 %
Basophils Absolute: 0 10*3/uL (ref 0.0–0.1)
EOS PCT: 1 %
Eosinophils Absolute: 0.3 10*3/uL (ref 0.0–0.7)
HCT: 33.4 % — ABNORMAL LOW (ref 39.0–52.0)
HEMOGLOBIN: 10 g/dL — AB (ref 13.0–17.0)
LYMPHS PCT: 4 %
Lymphs Abs: 1 10*3/uL (ref 0.7–4.0)
MCH: 26 pg (ref 26.0–34.0)
MCHC: 29.9 g/dL — AB (ref 30.0–36.0)
MCV: 87 fL (ref 78.0–100.0)
Monocytes Absolute: 1.3 10*3/uL — ABNORMAL HIGH (ref 0.1–1.0)
Monocytes Relative: 5 %
NEUTROS ABS: 22.7 10*3/uL — AB (ref 1.7–7.7)
NEUTROS PCT: 90 %
Platelets: 295 10*3/uL (ref 150–400)
RBC: 3.84 MIL/uL — ABNORMAL LOW (ref 4.22–5.81)
RDW: 16.2 % — ABNORMAL HIGH (ref 11.5–15.5)
WBC: 25.3 10*3/uL — ABNORMAL HIGH (ref 4.0–10.5)

## 2016-07-24 LAB — APTT: aPTT: 30 seconds (ref 24–36)

## 2016-07-24 LAB — PROTIME-INR
INR: 1.02
Prothrombin Time: 13.4 seconds (ref 11.4–15.2)

## 2016-07-24 MED ORDER — ASPIRIN EC 81 MG PO TBEC
81.0000 mg | DELAYED_RELEASE_TABLET | Freq: Every morning | ORAL | Status: DC
  Administered 2016-07-25 – 2016-07-28 (×4): 81 mg via ORAL
  Filled 2016-07-24 (×4): qty 1

## 2016-07-24 MED ORDER — SODIUM CHLORIDE 0.9 % IV SOLN: 250.0000 mL | INTRAVENOUS | Status: DC | PRN

## 2016-07-24 MED ORDER — SERTRALINE HCL 50 MG PO TABS
150.0000 mg | ORAL_TABLET | Freq: Every day | ORAL | Status: DC
  Administered 2016-07-24 – 2016-07-29 (×6): 150 mg via ORAL
  Filled 2016-07-24 (×6): qty 1

## 2016-07-24 MED ORDER — TAMSULOSIN HCL 0.4 MG PO CAPS
0.4000 mg | ORAL_CAPSULE | Freq: Every day | ORAL | Status: DC
  Administered 2016-07-25 – 2016-07-31 (×6): 0.4 mg via ORAL
  Filled 2016-07-24 (×6): qty 1

## 2016-07-24 MED ORDER — SODIUM CHLORIDE 0.9% FLUSH: 3.0000 mL | Freq: Two times a day (BID) | INTRAVENOUS | Status: DC

## 2016-07-24 MED ORDER — POLYVINYL ALCOHOL 1.4 % OP SOLN
1.0000 [drp] | Freq: Every day | OPHTHALMIC | Status: DC | PRN
  Filled 2016-07-24: qty 15

## 2016-07-24 MED ORDER — PRAVASTATIN SODIUM 20 MG PO TABS
20.0000 mg | ORAL_TABLET | Freq: Every day | ORAL | Status: DC
  Administered 2016-07-24 – 2016-07-29 (×6): 20 mg via ORAL
  Filled 2016-07-24 (×6): qty 1

## 2016-07-24 MED ORDER — QUETIAPINE FUMARATE 25 MG PO TABS
150.0000 mg | ORAL_TABLET | Freq: Every day | ORAL | Status: DC
  Administered 2016-07-24 – 2016-07-29 (×6): 150 mg via ORAL
  Filled 2016-07-24 (×2): qty 2
  Filled 2016-07-24: qty 3
  Filled 2016-07-24: qty 2
  Filled 2016-07-24 (×2): qty 3

## 2016-07-24 MED ORDER — GABAPENTIN 300 MG PO CAPS
300.0000 mg | ORAL_CAPSULE | Freq: Every day | ORAL | Status: DC
  Administered 2016-07-24 – 2016-07-29 (×6): 300 mg via ORAL
  Filled 2016-07-24 (×6): qty 1

## 2016-07-24 MED ORDER — SODIUM CHLORIDE 0.9 % WEIGHT BASED INFUSION
1.0000 mL/kg/h | INTRAVENOUS | Status: DC
  Administered 2016-07-24 – 2016-07-25 (×2): 1 mL/kg/h via INTRAVENOUS

## 2016-07-24 MED ORDER — VITAMIN D 1000 UNITS PO TABS
1000.0000 [IU] | ORAL_TABLET | Freq: Every day | ORAL | Status: DC
  Administered 2016-07-25 – 2016-07-31 (×7): 1000 [IU] via ORAL
  Filled 2016-07-24 (×7): qty 1

## 2016-07-24 MED ORDER — ALBUTEROL SULFATE (2.5 MG/3ML) 0.083% IN NEBU
2.5000 mg | INHALATION_SOLUTION | Freq: Four times a day (QID) | RESPIRATORY_TRACT | Status: DC | PRN
Start: 2016-07-24 — End: 2016-07-24

## 2016-07-24 MED ORDER — GALANTAMINE HYDROBROMIDE ER 8 MG PO CP24
16.0000 mg | ORAL_CAPSULE | Freq: Every day | ORAL | Status: DC
  Administered 2016-07-24 – 2016-08-02 (×7): 16 mg via ORAL
  Filled 2016-07-24 (×10): qty 2

## 2016-07-24 MED ORDER — DOCUSATE SODIUM 100 MG PO CAPS
100.0000 mg | ORAL_CAPSULE | Freq: Every day | ORAL | Status: DC
  Administered 2016-07-25 – 2016-07-30 (×5): 100 mg via ORAL
  Filled 2016-07-24 (×5): qty 1

## 2016-07-24 MED ORDER — ACETAMINOPHEN 325 MG PO TABS
650.0000 mg | ORAL_TABLET | ORAL | Status: DC | PRN
  Administered 2016-07-28 (×2): 650 mg via ORAL
  Filled 2016-07-24 (×2): qty 2

## 2016-07-24 MED ORDER — ONDANSETRON HCL 4 MG/2ML IJ SOLN: 4.0000 mg | Freq: Four times a day (QID) | INTRAMUSCULAR | Status: DC | PRN

## 2016-07-24 MED ORDER — PANTOPRAZOLE SODIUM 40 MG PO TBEC
40.0000 mg | DELAYED_RELEASE_TABLET | Freq: Two times a day (BID) | ORAL | Status: DC
  Administered 2016-07-24 – 2016-07-30 (×11): 40 mg via ORAL
  Filled 2016-07-24 (×11): qty 1

## 2016-07-24 MED ORDER — LEVOTHYROXINE SODIUM 75 MCG PO TABS
150.0000 ug | ORAL_TABLET | ORAL | Status: DC
  Administered 2016-07-25 – 2016-07-30 (×6): 150 ug via ORAL
  Filled 2016-07-24 (×7): qty 2

## 2016-07-24 MED ORDER — SODIUM CHLORIDE 0.9% FLUSH: 3.0000 mL | INTRAVENOUS | Status: DC | PRN

## 2016-07-24 MED ORDER — ALBUTEROL SULFATE (2.5 MG/3ML) 0.083% IN NEBU
3.0000 mL | INHALATION_SOLUTION | Freq: Four times a day (QID) | RESPIRATORY_TRACT | Status: DC | PRN
  Administered 2016-07-31: 3 mL via RESPIRATORY_TRACT
  Filled 2016-07-24: qty 3

## 2016-07-24 MED ORDER — AMLODIPINE BESYLATE 10 MG PO TABS
10.0000 mg | ORAL_TABLET | Freq: Every morning | ORAL | Status: DC
  Administered 2016-07-25 – 2016-07-29 (×5): 10 mg via ORAL
  Filled 2016-07-24 (×5): qty 1

## 2016-07-24 NOTE — H&P (Signed)
Cardiology Office Note Date:  07/14/2016   ID:  Wesley [name], DOB 07-11-1946, MRN [id number]  PCP:  [name], MD     Cardiologist:  [name], MD                  Chief Complaint  Patient presents with  . Aortic Stenosis     History of Present Illness: Wesley [name] is a 70 y.o. male who presents for follow-up of aortic valve disease. The patient has longstanding aortic stenosis, previously in the moderate range by echo criteria, presenting for annual follow-up. When I saw him one year ago he was doing very well. He had lost 50# and chronic dyspnea was improved. However, after that visit, he was hospitalized 3 separate times with respiratory failure. In review of notes, it appears he had aspiration pneumonia and diastolic heart failure. He's had no hospitalizations since December 2016.   He has been on home O2 for several years. 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 He has chronic kidney disease followed by Dr Justin Mend.  Last creatinine on file is 2.9 mg/dL.  He is short of breath with short distance walking. He has 4 classic cars and is able to clean and wax his cars, but sits on a stool to do this. Currently on 3L O2 which is his baseline. No CP, leg swelling. No lightheadedness or syncope. He saw Dr. Burt Knack on 11/2 and arrangements were made for R and L heart cath tomorrow with elective admission today for hydration.      Past Medical History:  Diagnosis Date  . Achalasia   . Aortic stenosis    mod-severe  . Chronic kidney disease    Stage IV  . HTN (hypertension)   . Hypothyroidism   . Morbid obesity ([place])   . Obesity hypoventilation syndrome ([facility])    Intolerant of NIMV  . Oropharyngeal cancer ([place])   . 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 PTSD (post-traumatic stress disorder)   . Pulmonary hypertension     secondary  . TBI (traumatic brain injury) [facility])          Past Surgical History:  Procedure Laterality Date  . CHONDROPLASTY  11/08/2002   extensive, of the patellofemoral joint  . CHONDROPLASTY  11/08/2002   lesser extent chondroplasty medial femoral condyle  . KNEE ARTHROSCOPY  11/08/2002   right knee examination  . KNEE ARTHROSCOPY W/ PARTIAL MEDIAL MENISCECTOMY  11/08/2002          Current Outpatient Prescriptions  Medication Sig Dispense Refill  . albuterol (PROVENTIL HFA;VENTOLIN HFA) 108 (90 BASE) MCG/ACT inhaler Inhale 2 puffs into the lungs every 6 (six) hours as needed for wheezing or shortness of breath. 1 Inhaler 3  . albuterol (PROVENTIL) (2.5 MG/3ML) 0.083% nebulizer solution Take 2.5 mg by nebulization every 6 (six) hours as needed for wheezing or shortness of breath.     [name] amLODipine (NORVASC) 10 MG tablet Take 10 mg by mouth every morning.     [name] aspirin EC 81 MG tablet Take 81 mg by mouth every morning.    . carboxymethylcellulose (REFRESH) 1 % ophthalmic solution Apply 1 drop to eye 3 (three) times daily.    . cholecalciferol (VITAMIN D) 1000 UNITS tablet Take 1,000 Units by mouth daily.     . ciprofloxacin (CIPRO) 500 MG tablet Take 500 mg by mouth 2 (two)  times daily.    [name] docusate sodium (COLACE) 100 MG capsule Take 100 mg by mouth daily.    [name] gabapentin (NEURONTIN) 300 MG capsule Take 300 mg by mouth at bedtime.     [name] galantamine (RAZADYNE ER) 16 MG 24 hr capsule Take 16 mg by mouth at bedtime.     [name] levothyroxine (SYNTHROID, LEVOTHROID) 150 MCG tablet Take 150 mcg by mouth every morning.    . Maltodextrin-Xanthan Gum (RESOURCE THICKENUP CLEAR) POWD Please dispense 1 box ( 1 month supply) 1 Can 1  . metoprolol (LOPRESSOR) 100 MG tablet Take 100 mg by mouth daily as needed (when top number of BP is greater than 140).     . metroNIDAZOLE (FLAGYL) 500 MG tablet Take 500 mg by mouth 2 (two) times daily.    . pantoprazole (PROTONIX) 40  MG tablet Take 1 tablet (40 mg total) by mouth 2 (two) times daily. 60 tablet 0  . pravastatin (PRAVACHOL) 20 MG tablet Take 20 mg by mouth at bedtime.    [name] QUEtiapine (SEROQUEL) 100 MG tablet Take 100 mg by mouth at bedtime. Take with 25 mg tablet to equal a total dose of 125 mg    . QUEtiapine (SEROQUEL) 25 MG tablet Take 25 mg by mouth at bedtime. Take with 100mg  tablet to equal a total dose of 125 mg    . sertraline (ZOLOFT) 100 MG tablet Take 150 mg by mouth at bedtime.    . Tamsulosin HCl (FLOMAX) 0.4 MG CAPS Take 0.4 mg by mouth daily after breakfast. Once a day     No current facility-administered medications for this visit.     Allergies:   Review of patient's allergies indicates no known allergies.   Social History:  The patient  reports that he has never smoked. He has never used smokeless tobacco. He reports that he does not drink alcohol or use drugs.   Family History:  The patient's  family history includes Aneurysm in his mother. He was adopted.    ROS:  Please see the history of present illness.  Otherwise, review of systems is positive for Abdominal pain.  All other systems are reviewed and negative.    PHYSICAL EXAM: VS:  BP 114/68   Pulse 70   Ht 5\' 10"  (1.778 m)   Wt 123.4 kg (272 lb 1.9 oz)   BMI 39.05 kg/m  , BMI Body mass index is 39.05 kg/m. GEN: Pleasant, obese, chronically ill-appearing male in no acute distress HEENT: normal  Neck: no JVD, no masses. bilateral carotid bruits Cardiac: RRR with grade 3/6 late peaking crescendo decrescendo murmur at the right upper sternal border, absent A2, no diastolic murmur    Respiratory:  Coarse bilateral breath sounds, normal work of breathing GI: soft, nontender, nondistended, + BS, obese MS: no deformity or atrophy  Ext: no pretibial edema Skin: warm and dry, no rash Neuro:  Strength and sensation are intact Psych: euthymic mood, full affect  EKG:  11/2 normal sinus rhythm with sinus  arrhythmia, heart rate 70 bpm, left ventricular hypertrophy, ST and T wave abnormality likely repolarization  Recent Labs: 09/01/2015: B Natriuretic Peptide 562.3 09/02/2015: TSH 2.196 09/05/2015: Magnesium 1.9 10/05/2015: ALT 14; BUN 36.3; Creatinine 2.9; Potassium 4.1; Sodium 142 06/22/2016: HGB 10.7; Platelets 225        Wt Readings from Last 3 Encounters:  07/14/16 123.4 kg (272 lb 1.9 oz)  05/23/16 125 kg (275 lb 8 oz)  04/01/16 122.5 kg (270 lb)  Cardiac Studies Reviewed: 2D Echo 07-05-2016: Left ventricle: The cavity size was normal. There was severe concentric hypertrophy. Systolic function was vigorous. The estimated ejection fraction was in the range of 65% to 70%. Wall motion was normal; there were no regional wall motion abnormalities. Doppler parameters are consistent with abnormal left ventricular relaxation (grade 1 diastolic dysfunction).  ------------------------------------------------------------------- Aortic valve: Trileaflet; severely thickened, severely calcified leaflets. Valve mobility was restricted. Doppler: There was severe stenosis. There was no regurgitation. VTI ratio of LVOT to aortic valve: 0.23. Valve area (VTI): 0.72 cm^2. Indexed valve area (VTI): 0.28 cm^2/m^2. Peak velocity ratio of LVOT to aortic valve: 0.27. Valve area (Vmax): 0.83 cm^2. Indexed valve area (Vmax): 0.33 cm^2/m^2. Mean velocity ratio of LVOT to aortic valve: 0.25. Valve area (Vmean): 0.8 cm^2. Indexed valve area (Vmean): 0.31 cm^2/m^2. Mean gradient (S): 49 mm Hg. Peak gradient (S): 77 mm Hg.  ------------------------------------------------------------------- Aorta: Ascending aorta: The ascending aorta was mildly dilated.  ------------------------------------------------------------------- Mitral valve: Structurally normal valve. Mobility was not restricted. Doppler: Transvalvular velocity was within the normal range. There was no  evidence for stenosis. There was no regurgitation. Peak gradient (D): 2 mm Hg.  ------------------------------------------------------------------- Left atrium: The atrium was moderately dilated.  ------------------------------------------------------------------- Right ventricle: The cavity size was normal. Wall thickness was normal. Systolic function was normal.  ------------------------------------------------------------------- Pulmonic valve: Doppler: Transvalvular velocity was within the normal range. There was no evidence for stenosis.  ------------------------------------------------------------------- Tricuspid valve: Structurally normal valve. Doppler: Transvalvular velocity was within the normal range. There was no regurgitation.  ------------------------------------------------------------------- Pulmonary artery: The main pulmonary artery was normal-sized. Systolic pressure was within the normal range.  ------------------------------------------------------------------- Right atrium: The atrium was normal in size.  ------------------------------------------------------------------- Pericardium: There was no pericardial effusion.  ------------------------------------------------------------------- Systemic veins: Inferior vena cava: The vessel was normal in size. The respirophasic diameter changes were blunted (< 50%), consistent with elevated central venous pressure. Diameter: 21.2 mm.  ------------------------------------------------------------------- Measurements  IVC Value Reference ID 21.2 mm ---------  Left ventricle Value Reference LV ID, ED, PLAX chordal 45.3 mm 43 - 52 LV ID, ES, PLAX chordal 32.3 mm 23 - 38 LV fx shortening, PLAX chordal  29 % >=29 LV PW thickness, ED 17.9 mm --------- IVS/LV PW ratio, ED 0.96 <=1.3 Stroke volume, 2D 91 ml --------- Stroke volume/bsa, 2D 36 ml/m^2 --------- LV e&', lateral 6.74 cm/s --------- LV E/e&', lateral 11.16 --------- LV e&', medial 5.11 cm/s --------- LV E/e&', medial 14.72 --------- LV e&', average 5.93 cm/s --------- LV E/e&', average 12.69 ---------  Ventricular septum Value Reference IVS thickness, ED 17.2 mm ---------  LVOT Value Reference LVOT ID, S 20 mm --------- LVOT area 3.14 cm^2 --------- LVOT peak velocity, S 117 cm/s --------- LVOT mean velocity, S 83.4 cm/s --------- LVOT VTI, S 29 cm --------- LVOT peak gradient, S 5 mm Hg ---------  Aortic valve Value Reference Aortic valve peak velocity, S 440 cm/s --------- Aortic valve mean velocity, S 329 cm/s --------- Aortic valve VTI, S 127 cm --------- Aortic mean gradient, S 49 mm Hg --------- Aortic peak gradient, S 77 mm Hg --------- VTI ratio, LVOT/AV 0.23 --------- Aortic valve area, VTI 0.72 cm^2 --------- Aortic valve area/bsa, VTI  0.28 cm^2/m^2 --------- Velocity ratio, peak, LVOT/AV 0.27 --------- Aortic valve area, peak velocity 0.83 cm^2 --------- Aortic valve area/bsa, peak 0.33 cm^2/m^2 --------- velocity Velocity ratio, mean, LVOT/AV 0.25 --------- Aortic valve area, mean velocity 0.8 cm^2 --------- Aortic valve area/bsa, mean 0.31 cm^2/m^2 --------- velocity  Aorta Value Reference Aortic root ID, ED  42 mm --------- Ascending aorta ID, A-P, S 42 mm ---------  Left atrium Value Reference LA ID, A-P, ES 51 mm --------- LA ID/bsa, A-P 2.01 cm/m^2 <=2.2  Mitral valve Value Reference Mitral E-wave peak velocity 75.2 cm/s --------- Mitral A-wave peak velocity 93.6 cm/s --------- Mitral deceleration time (H) 239 ms 150 - 230 Mitral peak gradient, D 2 mm Hg --------- Mitral E/A ratio, peak 0.8 ---------  Tricuspid valve Value Reference Tricuspid regurg peak velocity 311 cm/s --------- Tricuspid peak RV-RA gradient 39 mm Hg ---------  Right ventricle Value Reference TAPSE 24.5 mm --------- RV s&', lateral, S 9.14 cm/s ---------  ASSESSMENT AND PLAN: 1.  Severe, stage D, symptomatic aortic stenosis 2. Anemia of chronic renal disease: The patient is treated with Aranesp. His labs are reviewed and hemoglobin appears stable. 3. Chronic  kidney disease stage IV: Has been stable over time. Followed by Dr. Justin Mend. 4. Chronic respiratory failure, dependent  The patient is medically complex. He was hospitalized 3 times in November and December last year with acute on chronic respiratory failure. There was clearly a component of volume overload and diastolic heart failure based on review of records. He has essentially back to his baseline now, but remains limited by New York Heart Association functional class III symptoms of chronic diastolic heart failure. The patient's aortic stenosis has progressed based on echo findings. His LV function remains normal. I have personally reviewed his echo images which demonstrate severe calcification and restriction of his aortic valve leaflets with hemodynamic findings diagnostic of severe aortic stenosis. We have discussed the natural history of aortic stenosis as well as treatment options in the context of his specific medical problems. I have discussed his case with Dr. Justin Mend.  The patient has a poor prognosis without intervention on his aortic valve stenosis. It is highly unlikely that he would be a candidate for conventional heart surgery. TAVR may be a reasonable treatment option. His evaluation would require 3 separate procedures with ionated contrast (cardiac catheterization, CTA of the chest, abdomen, and pelvis, and gated cardiac CTA). We reviewed risks associated with this evaluation, including the possibility of acute kidney injury and progression to end-stage renal disease. The patient and his wife are counseled at length. They wish to proceed with evaluation and treatment.  Cardiac catheterization is arranged for tomorrow morning.  Hydration tonight.   I have reviewed the risks, indications, and alternatives to cardiac catheterization, possible angioplasty, and stenting with the patient. Risks include but are not limited to bleeding, infection, vascular injury, stroke, myocardial infection,  arrhythmia, kidney injury, radiation-related injury in the case of prolonged fluoroscopy use, emergency cardiac surgery, and death. The patient understands the risks of serious complication is 1-2 in [id number] with diagnostic cardiac cath and 1-2% or less with angioplasty/stenting.   Following cardiac catheterization I would anticipate referral to cardiac surgery for formal outpatient consultation.  Murray Hodgkins, NP 08/07/2016, 5:09 PM  Patient seen with NP, agree with the above note. He has severe aortic stenosis and is undergoing planning for TAVR.  He has CKD stage IV, followed by Dr Justin Mend.  He is being admitted tonight for hydration prior to right and left heart cath tomorrow.  No new complaints, has been stable clinically.  Exam consistent with severe AS.    We will admit and write for pre-cath hydration.   Loralie Champagne 07/21/2016 5:35 PM

## 2016-07-25 ENCOUNTER — Observation Stay (HOSPITAL_COMMUNITY): Payer: Medicare Other

## 2016-07-25 ENCOUNTER — Encounter (HOSPITAL_COMMUNITY): Admission: RE | Disposition: E | Payer: Self-pay | Source: Ambulatory Visit | Attending: Pulmonary Disease

## 2016-07-25 ENCOUNTER — Encounter (HOSPITAL_COMMUNITY): Payer: Self-pay | Admitting: Cardiovascular Disease

## 2016-07-25 DIAGNOSIS — N184 Chronic kidney disease, stage 4 (severe): Secondary | ICD-10-CM

## 2016-07-25 DIAGNOSIS — I2510 Atherosclerotic heart disease of native coronary artery without angina pectoris: Secondary | ICD-10-CM | POA: Diagnosis not present

## 2016-07-25 DIAGNOSIS — J189 Pneumonia, unspecified organism: Secondary | ICD-10-CM | POA: Diagnosis not present

## 2016-07-25 DIAGNOSIS — I350 Nonrheumatic aortic (valve) stenosis: Secondary | ICD-10-CM | POA: Diagnosis not present

## 2016-07-25 DIAGNOSIS — D72829 Elevated white blood cell count, unspecified: Secondary | ICD-10-CM | POA: Diagnosis not present

## 2016-07-25 HISTORY — PX: CARDIAC CATHETERIZATION: SHX172

## 2016-07-25 LAB — POCT I-STAT 3, VENOUS BLOOD GAS (G3P V)
Acid-Base Excess: 4 mmol/L — ABNORMAL HIGH (ref 0.0–2.0)
Acid-Base Excess: 5 mmol/L — ABNORMAL HIGH (ref 0.0–2.0)
BICARBONATE: 31.4 mmol/L — AB (ref 20.0–28.0)
Bicarbonate: 33.4 mmol/L — ABNORMAL HIGH (ref 20.0–28.0)
O2 Saturation: 55 %
O2 Saturation: 70 %
PCO2 VEN: 64.2 mmHg — AB (ref 44.0–60.0)
PCO2 VEN: 73.8 mmHg — AB (ref 44.0–60.0)
PH VEN: 7.264 (ref 7.250–7.430)
PH VEN: 7.298 (ref 7.250–7.430)
PO2 VEN: 34 mmHg (ref 32.0–45.0)
TCO2: 33 mmol/L (ref 0–100)
TCO2: 36 mmol/L (ref 0–100)
pO2, Ven: 42 mmHg (ref 32.0–45.0)

## 2016-07-25 LAB — CBC
HCT: 32.4 % — ABNORMAL LOW (ref 39.0–52.0)
HEMOGLOBIN: 9.5 g/dL — AB (ref 13.0–17.0)
MCH: 25.6 pg — AB (ref 26.0–34.0)
MCHC: 29.3 g/dL — ABNORMAL LOW (ref 30.0–36.0)
MCV: 87.3 fL (ref 78.0–100.0)
Platelets: 283 10*3/uL (ref 150–400)
RBC: 3.71 MIL/uL — AB (ref 4.22–5.81)
RDW: 16.6 % — ABNORMAL HIGH (ref 11.5–15.5)
WBC: 20.5 10*3/uL — ABNORMAL HIGH (ref 4.0–10.5)

## 2016-07-25 LAB — POCT I-STAT 3, ART BLOOD GAS (G3+)
Acid-Base Excess: 4 mmol/L — ABNORMAL HIGH (ref 0.0–2.0)
Bicarbonate: 32.8 mmol/L — ABNORMAL HIGH (ref 20.0–28.0)
O2 Saturation: 88 %
PCO2 ART: 70 mmHg — AB (ref 32.0–48.0)
TCO2: 35 mmol/L (ref 0–100)
pH, Arterial: 7.278 — ABNORMAL LOW (ref 7.350–7.450)
pO2, Arterial: 65 mmHg — ABNORMAL LOW (ref 83.0–108.0)

## 2016-07-25 LAB — URINE MICROSCOPIC-ADD ON: BACTERIA UA: NONE SEEN

## 2016-07-25 LAB — BASIC METABOLIC PANEL
ANION GAP: 7 (ref 5–15)
BUN: 59 mg/dL — ABNORMAL HIGH (ref 6–20)
CHLORIDE: 101 mmol/L (ref 101–111)
CO2: 33 mmol/L — AB (ref 22–32)
Calcium: 9.3 mg/dL (ref 8.9–10.3)
Creatinine, Ser: 3.7 mg/dL — ABNORMAL HIGH (ref 0.61–1.24)
GFR calc non Af Amer: 15 mL/min — ABNORMAL LOW (ref >60)
GFR, EST AFRICAN AMERICAN: 18 mL/min — AB (ref >60)
Glucose, Bld: 110 mg/dL — ABNORMAL HIGH (ref 65–99)
Potassium: 4.7 mmol/L (ref 3.5–5.1)
Sodium: 141 mmol/L (ref 135–145)

## 2016-07-25 LAB — URINALYSIS, ROUTINE W REFLEX MICROSCOPIC
Bilirubin Urine: NEGATIVE
GLUCOSE, UA: NEGATIVE mg/dL
Ketones, ur: NEGATIVE mg/dL
LEUKOCYTES UA: NEGATIVE
Nitrite: NEGATIVE
PH: 5 (ref 5.0–8.0)
PROTEIN: NEGATIVE mg/dL
Specific Gravity, Urine: 1.02 (ref 1.005–1.030)

## 2016-07-25 SURGERY — RIGHT/LEFT HEART CATH AND CORONARY ANGIOGRAPHY

## 2016-07-25 MED ORDER — LIDOCAINE HCL (PF) 1 % IJ SOLN
INTRAMUSCULAR | Status: DC | PRN
  Administered 2016-07-25: 20 mL

## 2016-07-25 MED ORDER — HEPARIN (PORCINE) IN NACL 2-0.9 UNIT/ML-% IJ SOLN
INTRAMUSCULAR | Status: AC
  Filled 2016-07-25: qty 1000

## 2016-07-25 MED ORDER — SODIUM CHLORIDE 0.9 % IV SOLN
INTRAVENOUS | Status: DC
  Administered 2016-07-25: 1000 mL via INTRAVENOUS

## 2016-07-25 MED ORDER — LIDOCAINE HCL (PF) 1 % IJ SOLN
INTRAMUSCULAR | Status: AC
  Filled 2016-07-25: qty 30

## 2016-07-25 MED ORDER — IOPAMIDOL (ISOVUE-370) INJECTION 76%
INTRAVENOUS | Status: AC
  Filled 2016-07-25: qty 100

## 2016-07-25 MED ORDER — MIDAZOLAM HCL 2 MG/2ML IJ SOLN
INTRAMUSCULAR | Status: AC
  Filled 2016-07-25: qty 2

## 2016-07-25 MED ORDER — HEPARIN (PORCINE) IN NACL 2-0.9 UNIT/ML-% IJ SOLN
INTRAMUSCULAR | Status: DC | PRN
  Administered 2016-07-25: 1000 mL

## 2016-07-25 MED ORDER — SODIUM CHLORIDE 0.9 % IV SOLN
250.0000 mL | INTRAVENOUS | Status: DC | PRN
  Administered 2016-07-31: 250 mL via INTRAVENOUS
  Administered 2016-08-05: 08:00:00 via INTRAVENOUS

## 2016-07-25 MED ORDER — IOPAMIDOL (ISOVUE-370) INJECTION 76%
INTRAVENOUS | Status: DC | PRN
  Administered 2016-07-25: 50 mL via INTRA_ARTERIAL

## 2016-07-25 MED ORDER — SODIUM CHLORIDE 0.9% FLUSH
3.0000 mL | INTRAVENOUS | Status: DC | PRN
  Administered 2016-07-27: 3 mL via INTRAVENOUS
  Filled 2016-07-25: qty 3

## 2016-07-25 MED ORDER — SODIUM CHLORIDE 0.9% FLUSH
3.0000 mL | Freq: Two times a day (BID) | INTRAVENOUS | Status: DC
  Administered 2016-07-26 – 2016-08-05 (×18): 3 mL via INTRAVENOUS

## 2016-07-25 MED ORDER — MIDAZOLAM HCL 2 MG/2ML IJ SOLN
INTRAMUSCULAR | Status: DC | PRN
  Administered 2016-07-25: 1 mg via INTRAVENOUS

## 2016-07-25 MED ORDER — FENTANYL CITRATE (PF) 100 MCG/2ML IJ SOLN
INTRAMUSCULAR | Status: AC
Start: 2016-07-25 — End: 2016-07-25
  Filled 2016-07-25: qty 2

## 2016-07-25 MED ORDER — FENTANYL CITRATE (PF) 100 MCG/2ML IJ SOLN
INTRAMUSCULAR | Status: DC | PRN
  Administered 2016-07-25: 25 ug via INTRAVENOUS

## 2016-07-25 SURGICAL SUPPLY — 9 items
CATH INFINITI 5FR JL5 (CATHETERS) ×3 IMPLANT
CATH INFINITI 5FR MULTPACK ANG (CATHETERS) ×3 IMPLANT
CATH SWAN GANZ 7F STRAIGHT (CATHETERS) ×3 IMPLANT
KIT HEART LEFT (KITS) ×3 IMPLANT
PACK CARDIAC CATHETERIZATION (CUSTOM PROCEDURE TRAY) ×3 IMPLANT
SHEATH PINNACLE 5F 10CM (SHEATH) ×3 IMPLANT
SHEATH PINNACLE 7F 10CM (SHEATH) ×3 IMPLANT
TRANSDUCER W/STOPCOCK (MISCELLANEOUS) ×6 IMPLANT
WIRE EMERALD 3MM-J .035X150CM (WIRE) ×3 IMPLANT

## 2016-07-25 NOTE — Progress Notes (Signed)
Wesley [name] paged with results of CXR-edema vs infectious infiltrate.

## 2016-07-25 NOTE — Interval H&P Note (Signed)
History and Physical Interval Note:  07/23/2016 11:43 AM  Brand Males  has presented today for surgery, with the diagnosis of arotic stenosis  The various methods of treatment have been discussed with the patient and family. After consideration of risks, benefits and other options for treatment, the patient has consented to  Procedure(s): Right/Left Heart Cath and Coronary Angiography (N/A) as a surgical intervention .  The patient's history has been reviewed, patient examined, no change in status, stable for surgery.  I have reviewed the patient's chart and labs.  Questions were answered to the patient's satisfaction.     [name]

## 2016-07-25 NOTE — Care Management Obs Status (Signed)
[facility] NOTIFICATION   Patient Details  Name: Wesley [name] MRN: [id number] Date of Birth: Oct 20, 1945   Medicare Observation Status Notification Given:  Yes    Bethena Roys, RN 07/24/2016, 2:08 PM

## 2016-07-25 NOTE — Care Management Note (Addendum)
Case Management Note  Patient Details  Name: Wesley [name] MRN: [id number] Date of Birth: Jun 12, 1946  Subjective/Objective:  Pt presented for follow-up of aortic valve disease. Pt is from home with wife. Per pt he has DME 02 @ 3 L via  [place] and he uses a cane occasionally.                 Action/Plan: CM will continue to monitor for additional needs.   Expected Discharge Date:                  Expected Discharge Plan:  Home/Self Care  In-House Referral:  NA  Discharge planning Services  CM Consult  Post Acute Care Choice:    Choice offered to:     DME Arranged:    DME Agency:     HH Arranged:    HH Agency:     Status of Service:  In process, will continue to follow  If discussed at Long Length of Stay Meetings, dates discussed:    Additional Comments:  Bethena Roys, RN 07/24/2016, 2:07 PM

## 2016-07-25 NOTE — Progress Notes (Signed)
Patient Name: Wesley [name] Date of Encounter: 08/07/2016  Primary Cardiologist: Dr. Tyrell Antonio Problem List     Active Problems:   Aortic stenosis     Subjective   Feels well, denies SOB.   Inpatient Medications    Scheduled Meds: . amLODipine  10 mg Oral q morning - 10a  . aspirin EC  81 mg Oral q morning - 10a  . cholecalciferol  1,000 Units Oral Daily  . docusate sodium  100 mg Oral Daily  . gabapentin  300 mg Oral QHS  . galantamine  16 mg Oral QHS  . levothyroxine  150 mcg Oral BH-q7a  . pantoprazole  40 mg Oral BID  . pravastatin  20 mg Oral QHS  . QUEtiapine  150 mg Oral QHS  . sertraline  150 mg Oral QHS  . sodium chloride flush  3 mL Intravenous Q12H  . tamsulosin  0.4 mg Oral QPC breakfast   Continuous Infusions: . sodium chloride 1 mL/kg/hr (08/01/2016 1829)   PRN Meds: sodium chloride, acetaminophen, albuterol, ondansetron (ZOFRAN) IV, polyvinyl alcohol, sodium chloride flush   Vital Signs    Vitals:   07/13/2016 1627 07/22/2016 2019 08/08/2016 0500  BP:  115/85 129/76  Pulse:  99 (!) 104  Resp:  16 18  Temp: 98.1 F (36.7 C) 98.7 F (37.1 C) 98.9 F (37.2 C)  TempSrc: Oral Oral Oral  SpO2:  97% 98%  Weight: 271 lb 6.2 oz (123.1 kg)  269 lb 6.4 oz (122.2 kg)  Height: 5\' 10"  (1.778 m)      Intake/Output Summary (Last 24 hours) at 08/02/2016 0807 Last data filed at 08/01/2016 0700  Gross per 24 hour  Intake           1048.4 ml  Output             3300 ml  Net          -2251.6 ml   Filed Weights   07/14/2016 1627 07/13/2016 0500  Weight: 271 lb 6.2 oz (123.1 kg) 269 lb 6.4 oz (122.2 kg)    Physical Exam   GEN: Ill appearing male in no acute distress.  HEENT: Grossly normal.  Neck: Supple, 5-6 cm JVD above clavicle at 45 degrees. No carotid bruits, or masses. Cardiac: RRR, 3/6 late peaking systolic murmur at RUSB. No rubs, or gallops. No clubbing, cyanosis, edema.  Radials/DP/PT 2+ and equal bilaterally.  Respiratory:  Respirations  regular and unlabored, clear to auscultation bilaterally. GI: Soft, nontender, nondistended, BS + x 4. MS: no deformity or atrophy. Skin: warm and dry, no rash. Neuro:  Strength and sensation are intact. Psych: AAOx3.  Normal affect.  Labs    CBC  Recent Labs  08/02/2016 1814 08/06/2016 0419  WBC 25.3* 20.5*  NEUTROABS 22.7*  --   HGB 10.0* 9.5*  HCT 33.4* 32.4*  MCV 87.0 87.3  PLT 295 [id number]   Basic Metabolic Panel  Recent Labs  07/22/2016 1814 07/25/16 0419  NA 139 141  K 4.4 4.7  CL 97* 101  CO2 32 33*  GLUCOSE 129* 110*  BUN 64* 59*  CREATININE 3.86* 3.70*  CALCIUM 9.7 9.3   Liver Function Tests  Recent Labs  07/29/2016 1814  AST 14*  ALT 9*  ALKPHOS 75  BILITOT 0.4  PROT 6.9  ALBUMIN 3.4*     Telemetry    NSR- Personally Reviewed  ECG    NSR, LVH. ST and T wave abnormality likely repol - Personally  Reviewed  Radiology    No results found.   Patient Profile     Mr. Wesley [name] is a 70 year old male with a past medical history of severe stage D aortic stenosis, CKD stage IV, and HTN. For right and left heart cath today in preparation for TAVR.   Assessment & Plan    1. Severe stage D aortic stenosis: For right and left heart cath today. He was admitted last night for hydration pre procedure. He feels well this am. Denies orthopnea. He and his wife have been counseled at length by Dr. Burt Knack regarding the need for 3 separate procedures with ionated contrast (cardiac catheterization, CTA of the chest, abdomen, and pelvis, and gated cardiac CTA). Patient is aware this might push him into end stage renal disease.   2. Chronic diastolic CHF: NYHA functional class III symptoms. LV function is normal. Has some volume overload on exam, he does think he can lie flat for heart cath.   3. CKD stage IV: Creatinine is 3.7, getting hydration pre-procedure.      Signed, Arbutus Leas, NP  07/20/2016, 8:07 AM

## 2016-07-25 NOTE — Plan of Care (Signed)
Problem: Education: Goal: Knowledge of [facility] Education information/materials will improve Outcome: Completed/Met Date Met: 07/30/2016 Pt educated throughout entire hospitalization regarding tests, procedures, medications, and available resources.   Problem: Safety: Goal: Ability to remain free from injury will improve Outcome: Completed/Met Date Met: 07/23/2016 Pt has remained free from injury during this admission

## 2016-07-25 NOTE — Progress Notes (Signed)
%   fr sheath removed rfa and direct pressure maintained for 10 minutes then 52fr sheath removed rfv and direct pressure maintained to both sites additional 5 minutes resulting in hemostasis to both sites.  Large clot aspirated from art. Sheath port prior to removal.  Distal pulses present, Rt DP and PT.  Sterile dsg applied to site and pt instructions given with voice back to assure understanding.  Pt transported to 3W13.

## 2016-07-26 ENCOUNTER — Encounter: Payer: Self-pay | Admitting: Thoracic Surgery (Cardiothoracic Vascular Surgery)

## 2016-07-26 ENCOUNTER — Observation Stay (HOSPITAL_COMMUNITY): Payer: Medicare Other

## 2016-07-26 DIAGNOSIS — I5032 Chronic diastolic (congestive) heart failure: Secondary | ICD-10-CM | POA: Diagnosis not present

## 2016-07-26 DIAGNOSIS — J9611 Chronic respiratory failure with hypoxia: Secondary | ICD-10-CM | POA: Diagnosis not present

## 2016-07-26 DIAGNOSIS — M549 Dorsalgia, unspecified: Secondary | ICD-10-CM | POA: Diagnosis present

## 2016-07-26 DIAGNOSIS — N179 Acute kidney failure, unspecified: Secondary | ICD-10-CM | POA: Diagnosis not present

## 2016-07-26 DIAGNOSIS — E872 Acidosis, unspecified: Secondary | ICD-10-CM | POA: Diagnosis present

## 2016-07-26 DIAGNOSIS — R1312 Dysphagia, oropharyngeal phase: Secondary | ICD-10-CM | POA: Diagnosis present

## 2016-07-26 DIAGNOSIS — E662 Morbid (severe) obesity with alveolar hypoventilation: Secondary | ICD-10-CM | POA: Diagnosis present

## 2016-07-26 DIAGNOSIS — D72829 Elevated white blood cell count, unspecified: Secondary | ICD-10-CM | POA: Diagnosis present

## 2016-07-26 DIAGNOSIS — N184 Chronic kidney disease, stage 4 (severe): Secondary | ICD-10-CM | POA: Diagnosis not present

## 2016-07-26 DIAGNOSIS — J690 Pneumonitis due to inhalation of food and vomit: Secondary | ICD-10-CM | POA: Diagnosis not present

## 2016-07-26 DIAGNOSIS — K5900 Constipation, unspecified: Secondary | ICD-10-CM | POA: Diagnosis present

## 2016-07-26 DIAGNOSIS — I5033 Acute on chronic diastolic (congestive) heart failure: Secondary | ICD-10-CM | POA: Diagnosis not present

## 2016-07-26 DIAGNOSIS — J189 Pneumonia, unspecified organism: Secondary | ICD-10-CM | POA: Diagnosis not present

## 2016-07-26 DIAGNOSIS — J9622 Acute and chronic respiratory failure with hypercapnia: Secondary | ICD-10-CM | POA: Diagnosis not present

## 2016-07-26 DIAGNOSIS — R259 Unspecified abnormal involuntary movements: Secondary | ICD-10-CM | POA: Diagnosis not present

## 2016-07-26 DIAGNOSIS — R0902 Hypoxemia: Secondary | ICD-10-CM | POA: Diagnosis not present

## 2016-07-26 DIAGNOSIS — J9601 Acute respiratory failure with hypoxia: Secondary | ICD-10-CM | POA: Diagnosis not present

## 2016-07-26 DIAGNOSIS — J9621 Acute and chronic respiratory failure with hypoxia: Secondary | ICD-10-CM | POA: Diagnosis not present

## 2016-07-26 DIAGNOSIS — I4901 Ventricular fibrillation: Secondary | ICD-10-CM | POA: Diagnosis not present

## 2016-07-26 DIAGNOSIS — G8929 Other chronic pain: Secondary | ICD-10-CM | POA: Diagnosis present

## 2016-07-26 DIAGNOSIS — J449 Chronic obstructive pulmonary disease, unspecified: Secondary | ICD-10-CM | POA: Diagnosis present

## 2016-07-26 DIAGNOSIS — I130 Hypertensive heart and chronic kidney disease with heart failure and stage 1 through stage 4 chronic kidney disease, or unspecified chronic kidney disease: Secondary | ICD-10-CM | POA: Diagnosis present

## 2016-07-26 DIAGNOSIS — I350 Nonrheumatic aortic (valve) stenosis: Secondary | ICD-10-CM | POA: Diagnosis not present

## 2016-07-26 DIAGNOSIS — R569 Unspecified convulsions: Secondary | ICD-10-CM | POA: Diagnosis not present

## 2016-07-26 DIAGNOSIS — I2510 Atherosclerotic heart disease of native coronary artery without angina pectoris: Secondary | ICD-10-CM | POA: Diagnosis present

## 2016-07-26 DIAGNOSIS — J810 Acute pulmonary edema: Secondary | ICD-10-CM | POA: Diagnosis not present

## 2016-07-26 DIAGNOSIS — I959 Hypotension, unspecified: Secondary | ICD-10-CM | POA: Diagnosis not present

## 2016-07-26 DIAGNOSIS — F4310 Post-traumatic stress disorder, unspecified: Secondary | ICD-10-CM | POA: Diagnosis present

## 2016-07-26 DIAGNOSIS — J9612 Chronic respiratory failure with hypercapnia: Secondary | ICD-10-CM

## 2016-07-26 DIAGNOSIS — J80 Acute respiratory distress syndrome: Secondary | ICD-10-CM | POA: Diagnosis not present

## 2016-07-26 DIAGNOSIS — D631 Anemia in chronic kidney disease: Secondary | ICD-10-CM | POA: Diagnosis not present

## 2016-07-26 DIAGNOSIS — E039 Hypothyroidism, unspecified: Secondary | ICD-10-CM | POA: Diagnosis present

## 2016-07-26 DIAGNOSIS — G253 Myoclonus: Secondary | ICD-10-CM | POA: Diagnosis not present

## 2016-07-26 DIAGNOSIS — G9341 Metabolic encephalopathy: Secondary | ICD-10-CM | POA: Diagnosis not present

## 2016-07-26 DIAGNOSIS — Z9911 Dependence on respirator [ventilator] status: Secondary | ICD-10-CM | POA: Diagnosis not present

## 2016-07-26 DIAGNOSIS — G4733 Obstructive sleep apnea (adult) (pediatric): Secondary | ICD-10-CM | POA: Diagnosis present

## 2016-07-26 DIAGNOSIS — K220 Achalasia of cardia: Secondary | ICD-10-CM | POA: Diagnosis present

## 2016-07-26 LAB — BLOOD GAS, ARTERIAL
ACID-BASE EXCESS: 4.9 mmol/L — AB (ref 0.0–2.0)
BICARBONATE: 31.2 mmol/L — AB (ref 20.0–28.0)
DRAWN BY: 244851
O2 CONTENT: 3 L/min
O2 SAT: 92.4 %
PCO2 ART: 66.5 mmHg — AB (ref 32.0–48.0)
PH ART: 7.29 — AB (ref 7.350–7.450)
Patient temperature: 98
pO2, Arterial: 67.6 mmHg — ABNORMAL LOW (ref 83.0–108.0)

## 2016-07-26 LAB — BASIC METABOLIC PANEL
ANION GAP: 7 (ref 5–15)
BUN: 60 mg/dL — AB (ref 6–20)
CALCIUM: 8.6 mg/dL — AB (ref 8.9–10.3)
CO2: 31 mmol/L (ref 22–32)
Chloride: 103 mmol/L (ref 101–111)
Creatinine, Ser: 3.75 mg/dL — ABNORMAL HIGH (ref 0.61–1.24)
GFR calc Af Amer: 17 mL/min — ABNORMAL LOW (ref >60)
GFR, EST NON AFRICAN AMERICAN: 15 mL/min — AB (ref >60)
GLUCOSE: 111 mg/dL — AB (ref 65–99)
Potassium: 4.7 mmol/L (ref 3.5–5.1)
SODIUM: 141 mmol/L (ref 135–145)

## 2016-07-26 LAB — CBC
HCT: 27.9 % — ABNORMAL LOW (ref 39.0–52.0)
Hemoglobin: 8.1 g/dL — ABNORMAL LOW (ref 13.0–17.0)
MCH: 25.6 pg — ABNORMAL LOW (ref 26.0–34.0)
MCHC: 29 g/dL — ABNORMAL LOW (ref 30.0–36.0)
MCV: 88 fL (ref 78.0–100.0)
PLATELETS: 255 10*3/uL (ref 150–400)
RBC: 3.17 MIL/uL — AB (ref 4.22–5.81)
RDW: 16.9 % — AB (ref 11.5–15.5)
WBC: 15.8 10*3/uL — AB (ref 4.0–10.5)

## 2016-07-26 LAB — INFLUENZA PANEL BY PCR (TYPE A & B)
Influenza A By PCR: NEGATIVE
Influenza B By PCR: NEGATIVE

## 2016-07-26 LAB — PROCALCITONIN: Procalcitonin: 1.25 ng/mL

## 2016-07-26 LAB — BRAIN NATRIURETIC PEPTIDE: B NATRIURETIC PEPTIDE 5: 364.8 pg/mL — AB (ref 0.0–100.0)

## 2016-07-26 LAB — SEDIMENTATION RATE: Sed Rate: 60 mm/hr — ABNORMAL HIGH (ref 0–16)

## 2016-07-26 MED ORDER — RESOURCE THICKENUP CLEAR PO POWD
ORAL | Status: DC | PRN
  Filled 2016-07-26: qty 125

## 2016-07-26 MED ORDER — CLINDAMYCIN PHOSPHATE 600 MG/50ML IV SOLN
600.0000 mg | Freq: Three times a day (TID) | INTRAVENOUS | Status: DC
  Administered 2016-07-26 – 2016-07-31 (×16): 600 mg via INTRAVENOUS
  Filled 2016-07-26 (×18): qty 50

## 2016-07-26 MED ORDER — LEVOFLOXACIN 500 MG PO TABS
500.0000 mg | ORAL_TABLET | ORAL | Status: DC
  Administered 2016-07-26: 500 mg via ORAL
  Filled 2016-07-26: qty 1

## 2016-07-26 MED ORDER — FUROSEMIDE 10 MG/ML IJ SOLN
40.0000 mg | Freq: Once | INTRAMUSCULAR | Status: AC
  Administered 2016-07-26: 40 mg via INTRAVENOUS
  Filled 2016-07-26: qty 4

## 2016-07-26 MED ORDER — SODIUM BICARBONATE 650 MG PO TABS
650.0000 mg | ORAL_TABLET | Freq: Two times a day (BID) | ORAL | Status: DC
  Administered 2016-07-26 (×2): 650 mg via ORAL
  Filled 2016-07-26 (×2): qty 1

## 2016-07-26 MED ORDER — LEVOFLOXACIN 500 MG PO TABS: 250.0000 mg | ORAL_TABLET | Freq: Every day | ORAL | Status: DC

## 2016-07-26 MED ORDER — LEVOFLOXACIN 500 MG PO TABS: 500.0000 mg | ORAL_TABLET | Freq: Once | ORAL | Status: DC

## 2016-07-26 NOTE — Consult Note (Signed)
Consultation Note   JAMONE GARRIDO EPP:[id number] DOB: 22-Mar-1946 DOA: 07/27/2016   PCP: [name], MD   Patient coming from/Resides with: Private residence/this with wife  Requesting physician: Dr. Turner/Cardiology  Reason for consultation: Suspected pneumonia and assumption of attending responsibilities  HPI: Wesley [name] is a 70 y.o. 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 Of note patient was recently treated beginning on November 3 with 10 days of Cipro and Flagyl for acute diverticulitis and a currently is asymptomatic.  Post cath patient was found to have a leukocytosis of 25,000, he was afebrile. Reported with increased cough and thick secretions and apparent altered mental status. He was empirically started on Levaquin with improvement and white count to 15,000. Patient was also given pre-and post procedure IV fluid hydration in setting of chronic kidney disease and patient undergoing cardiac catheterization/IV contrast administration. Request was made to assume care of the patient because of his worsening respiratory status that was felt to be related to infectious causes with requesting physician reporting that from a cardiac standpoint he was ready for discharge.  Upon my evaluation of the patient he was alert, he clarified that he always has a chronic cough with very thick secretions since undergoing radiation therapy. He has not had any increase in O2 requirements. He never has  shortness of breath unless he is not wearing his oxygen.   Review of Systems:  In addition to the HPI above,  No Fever-chills, myalgias or other constitutional symptoms No Headache, changes with Vision or hearing, new weakness, tingling, numbness in any extremity, dizziness, dysarthria or word finding difficulty, gait disturbance or imbalance, tremors or seizure activity No problems swallowing food or Liquids, indigestion/reflux, choking or coughing while eating but does have known silent aspiration and is chronically on a D3 diet with nectar thick liquids, abdominal pain with or after eating No Chest pain, Cough or Shortness of Breath, palpitations, orthopnea or DOE No Abdominal pain, N/V, melena,hematochezia, dark tarry stools, constipation and previous diverticulitis symptoms have resolved No dysuria, malodorous urine, hematuria or flank pain No new skin rashes, lesions, masses or bruises, No new joint pains, aches, swelling or redness No recent unintentional weight gain or loss No polyuria, polydypsia or polyphagia   Past Medical History:  Diagnosis Date  . Achalasia   . Anemia associated with chronic renal failure   . Aortic stenosis   . Chronic kidney disease    Stage IV  . Chronic kidney disease (CKD), stage IV (severe) ([place])   . Chronic respiratory failure with hypoxia ([place])   . CKD (chronic kidney disease) stage 4, GFR 15-29 ml/min (H[facility])   . Complication of anesthesia   . Depression   . Difficult airway for intubation   . Difficult intubation   . Esophageal dysmotility   . Essential hypertension   . HTN (hypertension)   . Hypothyroidism   . Morbid obesity ([place])   . Obesity hypoventilation syndrome (HCC)    Intolerant of NIMV  . Oropharyngeal cancer ([place])   . 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 PTSD (post-traumatic stress disorder)   .  Pulmonary hypertension    secondary  . S/P radiation therapy    Left tonsil: 70 Gy/ 35 Fractions   . TBI (traumatic  brain injury) [facility])     Past Surgical History:  Procedure Laterality Date  . CARDIAC CATHETERIZATION N/A 07/24/2016   Procedure: Right/Left Heart Cath and Coronary Angiography;  Surgeon: [name], MD;  Location: [facility];  Service: Cardiovascular;  Laterality: N/A;  . CHONDROPLASTY  11/08/2002   extensive, of the patellofemoral joint  . CHONDROPLASTY  11/08/2002   lesser extent chondroplasty medial femoral condyle  . KNEE ARTHROSCOPY  11/08/2002   right knee examination  . KNEE ARTHROSCOPY W/ PARTIAL MEDIAL MENISCECTOMY  11/08/2002    Social History   Social History  . Marital status: Married    Spouse name: N/A  . Number of children: N/A  . Years of education: N/A   Occupational History  . retired Insurance underwriter Retired   Social History Main Topics  . Smoking status: Never Smoker  . Smokeless tobacco: Never Used     Comment: smoked 1 pack a month  . Alcohol use No  . Drug use: No  . Sexual activity: No   Other Topics Concern  . Not on file   Social History Narrative  . No narrative on file    Mobility: Without assistive devices Work history: Not obtained   No Known Allergies  Family History  Problem Relation Age of Onset  . Adopted: Yes  . Aneurysm Mother     deceased from brain aneurysm     Prior to Admission medications   Medication Sig Start Date End Date Taking? Authorizing Provider  albuterol (PROVENTIL HFA;VENTOLIN HFA) 108 (90 BASE) MCG/ACT inhaler Inhale 2 puffs into the lungs every 6 (six) hours as needed for wheezing or shortness of breath. 08/13/15  Yes Nishant Dhungel, MD  albuterol (PROVENTIL) (2.5 MG/3ML) 0.083% nebulizer solution Take 2.5 mg by nebulization every 6 (six) hours as needed for wheezing or shortness of breath.  07/18/14  Yes Historical Provider, MD  amLODipine (NORVASC) 10 MG tablet Take 10 mg by mouth every morning.    Yes Historical Provider, MD  aspirin EC 81 MG tablet Take 81 mg by mouth every morning.   Yes Historical  Provider, MD  carboxymethylcellulose (REFRESH) 1 % ophthalmic solution Apply 1 drop to eye daily as needed (dry eyes).    Yes Historical Provider, MD  cholecalciferol (VITAMIN D) 1000 UNITS tablet Take 1,000 Units by mouth daily.    Yes Historical Provider, MD  docusate sodium (COLACE) 100 MG capsule Take 100 mg by mouth daily.   Yes Historical Provider, MD  furosemide (LASIX) 20 MG tablet Take 20 mg by mouth daily as needed for edema.  04/15/16  Yes Historical Provider, MD  gabapentin (NEURONTIN) 300 MG capsule Take 300 mg by mouth at bedtime.    Yes Historical Provider, MD  galantamine (RAZADYNE ER) 16 MG 24 hr capsule Take 16 mg by mouth at bedtime.    Yes Historical Provider, MD  levothyroxine (SYNTHROID, LEVOTHROID) 150 MCG tablet Take 150 mcg by mouth every morning. 06/20/16  Yes Historical Provider, MD  Maltodextrin-Xanthan Gum ([place]) POWD Please dispense 1 box ( 1 month supply) 08/29/15  Yes Domenic Polite, MD  metoprolol (LOPRESSOR) 100 MG tablet Take 100 mg by mouth daily as needed (when top number of BP is greater than 140).    Yes Historical Provider, MD  pantoprazole (PROTONIX) 40 MG tablet Take 1 tablet (40 mg  total) by mouth 2 (two) times daily. 08/29/15  Yes Zannie Cove, MD  pravastatin (PRAVACHOL) 20 MG tablet Take 20 mg by mouth at bedtime.   Yes Historical Provider, MD  QUEtiapine (SEROQUEL) 50 MG tablet Take 150 mg by mouth at bedtime.   Yes Historical Provider, MD  sertraline (ZOLOFT) 100 MG tablet Take 150 mg by mouth at bedtime.   Yes Historical Provider, MD  Tamsulosin HCl (FLOMAX) 0.4 MG CAPS Take 0.4 mg by mouth daily after breakfast. Once a day 11/14/10  Yes Historical Provider, MD  ciprofloxacin (CIPRO) 500 MG tablet Take 500 mg by mouth 2 (two) times daily. 05/22/16   Historical Provider, MD  metroNIDAZOLE (FLAGYL) 500 MG tablet Take 500 mg by mouth 2 (two) times daily. 05/22/16   Historical Provider, MD    Physical Exam: Vitals:   07/15/2016 2140  07/20/2016 2146 07/26/16 0455 07/26/16 1037  BP: 118/67  (!) 108/52 105/60  Pulse:   73   Resp:    18  Temp:  98.1 F (36.7 C) 97.8 F (36.6 C) 98 F (36.7 C)  TempSrc:   Oral   SpO2: (!) 86%   96%  Weight:   124.6 kg (274 lb 9.6 oz)   Height:          Constitutional: NAD, calm, comfortable Eyes: PERRL, lids and conjunctivae normal ENMT: Mucous membranes are moist. Posterior pharynx clear of any exudate or lesions.Normal dentition.  Neck: normal, supple, no masses, no thyromegaly Respiratory: clear to auscultation bilaterally, no wheezing, no crackles. Normal respiratory effort. No accessory muscle use. 3 L nasal cannula Cardiovascular: Regular rate and rhythm, no  rubs / gallops. Tight sounding grade 3/6 systolic murmur No extremity edema. 2+ pedal pulses. No carotid bruits.  Abdomen: no tenderness, no masses palpated. No hepatosplenomegaly. Bowel sounds positive.  Musculoskeletal: no clubbing / cyanosis. No joint deformity upper and lower extremities. Good ROM, no contractures. Normal muscle tone.  Skin: no rashes, lesions, ulcers. No induration Neurologic: CN 2-12 grossly intact. Sensation intact, DTR normal. Strength 5/5 x all 4 extremities.  Psychiatric: Normal judgment and insight. Alert and oriented x 3. Normal mood.    Labs on Admission: I have personally reviewed following labs and imaging studies  CBC:  Recent Labs Lab 07/20/16 1012 07/15/2016 1814 08/06/2016 0419 07/26/16 0339  WBC 7.5 25.3* 20.5* 15.8*  NEUTROABS 5.5 22.7*  --   --   HGB 9.2* 10.0* 9.5* 8.1*  HCT 31.0* 33.4* 32.4* 27.9*  MCV 87.3 87.0 87.3 88.0  PLT 209 295 283 255   Basic Metabolic Panel:  Recent Labs Lab 07/14/2016 1814 07/22/2016 0419 07/26/16 0339  NA 139 141 141  K 4.4 4.7 4.7  CL 97* 101 103  CO2 32 33* 31  GLUCOSE 129* 110* 111*  BUN 64* 59* 60*  CREATININE 3.86* 3.70* 3.75*  CALCIUM 9.7 9.3 8.6*   GFR: Estimated Creatinine Clearance: 23.9 mL/min (by C-G formula based on SCr of  3.75 mg/dL (H)). Liver Function Tests:  Recent Labs Lab 08/02/2016 1814  AST 14*  ALT 9*  ALKPHOS 75  BILITOT 0.4  PROT 6.9  ALBUMIN 3.4*   No results for input(s): LIPASE, AMYLASE in the last 168 hours. No results for input(s): AMMONIA in the last 168 hours. Coagulation Profile:  Recent Labs Lab 08/07/2016 1814  INR 1.02   Cardiac Enzymes: No results for input(s): CKTOTAL, CKMB, CKMBINDEX, TROPONINI in the last 168 hours. BNP (last 3 results) No results for input(s): PROBNP in the last  8760 hours. HbA1C: No results for input(s): HGBA1C in the last 72 hours. CBG: No results for input(s): GLUCAP in the last 168 hours. Lipid Profile: No results for input(s): CHOL, HDL, LDLCALC, TRIG, CHOLHDL, LDLDIRECT in the last 72 hours. Thyroid Function Tests: No results for input(s): TSH, T4TOTAL, FREET4, T3FREE, THYROIDAB in the last 72 hours. Anemia Panel: No results for input(s): VITAMINB12, FOLATE, FERRITIN, TIBC, IRON, RETICCTPCT in the last 72 hours. Urine analysis:    Component Value Date/Time   COLORURINE YELLOW 07/15/2016 1722   APPEARANCEUR CLEAR 07/19/2016 1722   LABSPEC 1.020 07/29/2016 1722   PHURINE 5.0 07/14/2016 1722   GLUCOSEU NEGATIVE 07/29/2016 1722   HGBUR TRACE (A) 07/31/2016 1722   BILIRUBINUR NEGATIVE 07/24/2016 1722   KETONESUR NEGATIVE 07/22/2016 1722   PROTEINUR NEGATIVE 08/11/2016 1722   UROBILINOGEN 0.2 08/27/2014 1405   NITRITE NEGATIVE 07/20/2016 1722   LEUKOCYTESUR NEGATIVE 08/10/2016 1722   Sepsis Labs: '@LABRCNTIP'$ (procalcitonin:4,lacticidven:4) )No results found for this or any previous visit (from the past 240 hour(s)).   Radiological Exams on Admission: Dg Chest 2 View  Result Date: 07/22/2016 CLINICAL DATA:  Pt presented for follow-up of aortic valve disease. Pt scheduled to have valve replacement in December. Pt SOB, with oxygen saturations in 80's EXAM: CHEST  2 VIEW COMPARISON:  11/05/2015 FINDINGS: The heart is enlarged. There are  patchy airspace opacities within the left mid lung zone and both lung bases. Pulmonary vascular congestion and mild interstitial edema are also noted. Left pleural effusion. IMPRESSION: 1. Changes consistent with congestive heart failure. 2. Stable left pleural effusion or pleural changes. 3. Confluent edema within the left mid lung zone and lung bases versus infectious infiltrates. Electronically Signed   By: Nolon Nations M.D.   On: 08/01/2016 18:44    Assessment/Plan Principal Problem:   Aspiration pneumonia Faythe Dingwall chronic dysphagia with silent aspiration -Patient with history of chronic recurrent aspiration after treatment for tonsillar cancer with chemotherapy and radiation and requires a D3 diet with nectar thick liquids -Chest x-ray yesterday without any focal infiltrate -No apparent worsening in O2 requirements or change in cough and only abnormal finding was leukocytosis which has improved with the addition of Levaquin -Wife states past 3 treatments have been Levaquin so we'll change to clindamycin -For completeness of exam check blood cultures -Chek Procalcitonin and ESR -Last swallow evaluation was in December 2016 so we'll repeat to determine if any changes  Active Problems:   CKD (chronic kidney disease) stage 4, GFR 15-29 ml/min/chronic Metabolic acidosis -In review of patient's prior ABGs he has chronic metabolic acidemia which is compensated based on electrolyte panel with chronically elevated CO2 -Begin oral sodium bicarbonate twice daily -Follow-up with nephrology as an outpatient -Current renal function stable post cardiac catheterization and received IV contrast    Chronic respiratory failure with hypoxia and hypercapnia  -Pulmonologist/Sood documented combination of recurrent aspiration and sleep apnea etiology to patient's chronic hypoxemia -Chest x-ray had a chronic pneumonitis-appearing pattern bilaterally and chest x-ray on 11/13 without focal infiltrates -As  documented above patient has chronic cough with thick secretions in O2 requirements have not increased so patient appears to be at baseline -Repeat chest x-ray now -Treat possible aspiration pneumonitis as above -Has chronically elevated PCO2 and although has mild acidemia ABG I suspect this is more metabolic in nature related to his chronic kidney disease -Check ambulatory pulse oximetry to see if desats with activity; 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 without becoming  tired or short of breath -Patient reports last sleep study indicated he no longer required CPAP and as noted is utilizing continuous oxygen at 3 L/m -received influenza vaccine but as precaution check influenza PCR    Nonrheumatic aortic valve stenosis -Underwent cardiac catheterization on 11/13 -Plans are to pursue TAVR on December 12 remains medically stable -Wife reports that she has previously been instructed by anesthesiology that he MUST intubated while awake since he is a very difficult intubation    Leukocytosis -Uncertain if related to infection noting patient does not have any fever but infectious workup has been undertaken -Inadequate perfusion and/or recent treatment for diverticulitis could be culprit although currently asymptomatic -White count decreased after IV fluid hydration as well -White count has decreased with the initiation of Levaquin therefore we will continue antibacterial treatments but will DC Levaquin in favor of clindamycin as above -Follow up on ESR Procalcitonin -CBC in a.m.    Chronic diastolic congestive heart failure  -Appears compensated and typically only takes Lasix at home for edema -For completeness of exam check BNP and follow up chest x-ray -Weight is up about 2 pounds and therefore high index of suspicion for possible heart failure exacerbation can give one time IV Lasix dose but will defer to cardiology team    Essential hypertension -Defer to  cardiology team    Anemia associated with chronic renal failure -Hemoglobin slightly lower than baseline of between 9.5 and 10-currently 8.1 which could reflect hemodilution (received IV fluids pre-and post catheterization)      DVT prophylaxis: SCDs Code Status: Full Family Communication: Wife at bedside Disposition Plan: Anticipate discharge back to preadmission home environment once medically stable     Rickia Freeburg L. ANP-BC [facility] Pager [phone]   If 7PM-7AM, please contact night-coverage [email or web address] Password [email or web address]  07/26/2016, 11:39 AM

## 2016-07-26 NOTE — Consult Note (Signed)
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                           Value          Reference  TAPSE                                     24.5  mm       ---------  RV s&', lateral, S                         9.14  cm/s     ---------  Legend: (L)  and  (H)  mark values outside specified reference range.  ------------------------------------------------------------------- Prepared and Electronically Authenticated by  Candee Furbish, M.D. 2017-10-24T13:27:13    Right/Left Heart Cath and Coronary Angiography  Conclusion   1. Severe aortic stenosis with a mean transaortic gradient of 67 mmHg and calculated aortic valve area of 0.77 cm 2. Diffusely calcified but nonobstructive coronary artery disease 3. Moderate pulmonary hypertension  The patient will continue evaluation for  TAVR for treatment of his severe aortic stenosis. He will be kept overnight for fluid hydration in the setting of stage IV chronic kidney disease. Will repeat a CBC and metabolic panel in the morning and check a chest x-ray and urinalysis to evaluate his leukocytosis.  Indications   Severe aortic stenosis [I35.0 (ICD-10-CM)]  Procedural Details/Technique   Technical Details INDICATION: Severe aortic stenosis. Preop evaluation. 70 yo male with severe lung disease and home 02 dependence who has developed severe aortic stenosis, now presenting for right/left heart catheterization.  PROCEDURAL DETAILS: The right groin was prepped, draped, and anesthetized with 1% lidocaine. Using the modified Seldinger technique a 5 French sheath was placed in the right femoral artery and a 7 French sheath was placed in the right femoral vein. A Swan-Ganz catheter was used for the right heart catheterization. Standard protocol was followed for recording of right heart pressures and sampling of oxygen saturations. Fick cardiac output was calculated. Standard Judkins catheters were used for selective coronary angiography. The J-wire is advanced into the LV and the JL-5 catheter is used to record LV pressure and perform pullback across the aortic valve. There were no immediate procedural complications. The patient was transferred to the post catheterization recovery area for further monitoring. .   Estimated blood loss <50 mL.  During this procedure the patient was administered the following to achieve and maintain moderate conscious sedation: Versed 1 mg, Fentanyl 25 mcg, while the patient's heart rate, blood pressure, and oxygen saturation were continuously monitored. The period of conscious sedation was 24 minutes, of which I was present face-to-face 100% of this time.    Coronary Findings   Dominance: Right  Left Main  Vessel is angiographically normal.  Left Anterior Descending  Mid LAD lesion, 40%  stenosed. The  lesion is calcified. Coronary arteries are very difficult to fill. The LAD is calcified. The mid LAD has 40-50% stenosis without high-grade obstruction present.  Left Circumflex  Ost Cx lesion, 40% stenosed.  Right Coronary Artery  The vessel exhibits minimal luminal irregularities. The RCA is a large, dominant vessel. There are diffuse irregularities and the vessel is moderately calcified. There is no significant stenosis throughout the RCA with a large PDA and PLA branches that it supplies.  Right Heart   Right Heart Pressures Hemodynamic findings consistent with moderate pulmonary hypertension. Elevated LV EDP consistent with volume overload. Moderate pulmonary hypertension with pulmonary vascular resistance of 3 Woods units    Left Heart   Aortic Valve There is severe aortic valve stenosis. The aortic valve is calcified. There is abnormal aortic valve motion. The aortic valve is severely calcified and restricted as seen on plain fluoroscopy. The peak to peak transaortic gradient is 86 mmHg. The mean transaortic gradient is 66 mmHg. Calculated aortic valve area is 0.77 cm.    Coronary Diagrams   Diagnostic Diagram     Implants     No implant documentation for this case.  PACS Images   Show images for Cardiac catheterization   Link to Procedure Log   Procedure Log    Hemo Data   Flowsheet Row Most Recent Value  Fick Cardiac Output 7.42 L/min  Fick Cardiac Output Index 3.12 (L/min)/BSA  Aortic Mean Gradient 66.5 mmHg  Aortic Peak Gradient 86 mmHg  Aortic Valve Area 0.77  Aortic Value Area Index 0.32 cm2/BSA  RA A Wave 14 mmHg  RA V Wave 17 mmHg  RA Mean 14 mmHg  RV Systolic Pressure 60 mmHg  RV Diastolic Pressure 9 mmHg  RV EDP 15 mmHg  PA Systolic Pressure 60 mmHg  PA Diastolic Pressure 35 mmHg  PA Mean 45 mmHg  PW A Wave 23 mmHg  PW V Wave 21 mmHg  PW Mean 21 mmHg  AO Systolic Pressure 99991111 mmHg  AO Diastolic Pressure 69 mmHg  AO Mean 83 mmHg  LV Systolic  Pressure 0000000 mmHg  LV Diastolic Pressure 7 mmHg  LV EDP 15 mmHg  Arterial Occlusion Pressure Extended Systolic Pressure 123456 mmHg  Arterial Occlusion Pressure Extended Diastolic Pressure 69 mmHg  Arterial Occlusion Pressure Extended Mean Pressure 84 mmHg  Left Ventricular Apex Extended Systolic Pressure 99991111 mmHg  Left Ventricular Apex Extended Diastolic Pressure 9 mmHg  Left Ventricular Apex Extended EDP Pressure 18 mmHg  QP/QS 1  TPVR Index 14.43 HRUI  TSVR Index 26.61 HRUI  PVR SVR Ratio 0.35  TPVR/TSVR Ratio 0.54     STS Risk Calculator  Procedure    AVR  Risk of Mortality   8.6% Morbidity or Mortality  49.0% Prolonged LOS   25.1% Short LOS    12.3% Permanent Stroke   2.0% Prolonged Vent Support  36.3% DSW Infection    0.9% Renal Failure    29.4% Reoperation    13.3%     Impression:  Patient has stage D severe symptomatic aortic stenosis. He has a long history of chronic respiratory failure for which he is oxygen dependent continuously at home that is primarily related to severe chronic scarring caused by numerous recurrent episodes of aspiration pneumonia as well as obstructive sleep apnea, but he also describes symptoms of exertional shortness of breath and progressive fatigue consistent with chronic diastolic congestive heart failure, [place] Heart Association functional class III. I have personally reviewed the patient's recent  transthoracic echocardiogram and diagnostic cardiac catheterization. Echocardiogram confirms the presence of severe aortic stenosis with severe thickening, calcification, and restricted leaflet mobility involving all 3 leaflets of the patient's aortic valve. Peak velocity across the aortic valve measured 4.4 m/s corresponding to mean transvalvular gradient estimated 49 mmHg. Diagnostic cardiac catheterization confirmed the presence of severe aortic stenosis with mean transvalvular gradient measured 67 mmHg by catheterization. The patient did not have  significant coronary artery disease. He did have moderate pulmonary hypertension. I agree that he would benefit from aortic valve replacement.  Risks associated with conventional surgical aortic valve replacement would unquestionably be very high because of the patient's numerous comorbid medical problems and chronic physical limitations. Moreover, CT scan of the chest performed in 2016 also reviews the presence of significant calcification of the ascending thoracic aorta and transverse aortic arch. I would not consider this patient a candidate for conventional surgical aortic valve replacement.  I agree that it would be very reasonable to proceed with further diagnostic testing to evaluate the feasibility of transcatheter aortic valve replacement as a far less risky alternative.    Plan:  The patient and his wife were counseled at length regarding treatment alternatives for management of severe symptomatic aortic stenosis. Alternative approaches such as conventional aortic valve replacement, transcatheter aortic valve replacement, and palliative medical therapy were compared and contrasted at length.  The risks associated with conventional surgical aortic valve replacement were been discussed in detail, as were reasons why I would be very reluctant to consider this patient a candidate for conventional surgery.  Long-term prognosis with medical therapy was discussed. This discussion was placed in the context of the patient's own specific clinical presentation and past medical history.  All of their questions been addressed.    The patient is very interested in proceeding with further diagnostic testing to evaluate the feasibility of transcatheter aortic valve replacement. He understands that he will need to undergo further diagnostic testing to include CT angiography which will require additional administration of intravenous contrast which potentially could run the risk of causing contrast-induced  nephropathy and possible acute renal failure. Under the circumstances these tests will need to be staged over a period of time.  The patient will also undergo repeat pulmonary function testing and a formal physical therapy evaluation. He will be evaluated by a multidisciplinary team of specialists of the next few weeks.   I spent in excess of 120 minutes during the conduct of this hospital consultation and >50% of this time involved direct face-to-face encounter for counseling and/or coordination of the patient's care.    Valentina Gu. Roxy Manns, MD 07/26/2016 5:16 PM

## 2016-07-26 NOTE — Progress Notes (Signed)
CRITICAL VALUE ALERT  Critical value received:  ABG CO2  Date of notification:  07/26/16  Time of notification:  11:00  Critical value read back: yes  Nurse who received alert:  Gerlene Fee  MD notified (1st page):  Erin Hearing, NP  Time of first page:  11:02  MD notified (2nd page):  Time of second page:  Responding MD:  Erin Hearing  Time MD responded:  11:10

## 2016-07-26 NOTE — Progress Notes (Signed)
Patient Name: Wesley [name] Date of Encounter: 07/26/2016  Primary Cardiologist: Dr. Tyrell Antonio Problem List     Active Problems:   Nonrheumatic aortic valve stenosis     Subjective   Feels ok, has a cough. Feels SOB.   Inpatient Medications    Scheduled Meds: . amLODipine  10 mg Oral q morning - 10a  . aspirin EC  81 mg Oral q morning - 10a  . cholecalciferol  1,000 Units Oral Daily  . docusate sodium  100 mg Oral Daily  . gabapentin  300 mg Oral QHS  . galantamine  16 mg Oral QHS  . levothyroxine  150 mcg Oral BH-q7a  . pantoprazole  40 mg Oral BID  . pravastatin  20 mg Oral QHS  . QUEtiapine  150 mg Oral QHS  . sertraline  150 mg Oral QHS  . sodium chloride flush  3 mL Intravenous Q12H  . tamsulosin  0.4 mg Oral QPC breakfast   Continuous Infusions:  PRN Meds: sodium chloride, acetaminophen, albuterol, ondansetron (ZOFRAN) IV, polyvinyl alcohol, sodium chloride flush   Vital Signs    Vitals:   07/24/2016 1810 08/06/2016 2140 07/18/2016 2146 07/26/16 0455  BP: 109/61 118/67  (!) 108/52  Pulse:    73  Resp:      Temp:   98.1 F (36.7 C) 97.8 F (36.6 C)  TempSrc:    Oral  SpO2: 94% (!) 86%    Weight:    274 lb 9.6 oz (124.6 kg)  Height:        Intake/Output Summary (Last 24 hours) at 07/26/16 0853 Last data filed at 07/26/16 0500  Gross per 24 hour  Intake              850 ml  Output              450 ml  Net              400 ml   Filed Weights   07/31/2016 1627 07/16/2016 0500 07/26/16 0455  Weight: 271 lb 6.2 oz (123.1 kg) 269 lb 6.4 oz (122.2 kg) 274 lb 9.6 oz (124.6 kg)    Physical Exam    GEN: Well nourished, well developed, in no acute distress.  HEENT: Grossly normal.  Neck: Supple, no JVD, carotid bruits, or masses. Cardiac: RRR, 3/6 systolic murmur, rubs, or gallops. No clubbing, cyanosis, edema.  Radials/DP/PT 2+ and equal bilaterally.  Respiratory:  Respirations regular, slightly labored. Diminished throughout.  GI: Soft,  nontender, nondistended, BS + x 4. MS: no deformity or atrophy. Skin: warm and dry, no rash. Neuro:  Strength and sensation are intact. Psych: AAOx3.  Normal affect.  Labs    CBC  Recent Labs  07/19/2016 1814 07/24/2016 0419 07/26/16 0339  WBC 25.3* 20.5* 15.8*  NEUTROABS 22.7*  --   --   HGB 10.0* 9.5* 8.1*  HCT 33.4* 32.4* 27.9*  MCV 87.0 87.3 88.0  PLT 295 283 [id number]   Basic Metabolic Panel  Recent Labs  08/08/2016 0419 07/26/16 0339  NA 141 141  K 4.7 4.7  CL 101 103  CO2 33* 31  GLUCOSE 110* 111*  BUN 59* 60*  CREATININE 3.70* 3.75*  CALCIUM 9.3 8.6*   Liver Function Tests  Recent Labs  07/16/2016 1814  AST 14*  ALT 9*  ALKPHOS 75  BILITOT 0.4  PROT 6.9  ALBUMIN 3.4*     Telemetry    NSR - Personally Reviewed  ECG  NSR - Personally Reviewed  Radiology    Dg Chest 2 View  Result Date: 07/19/2016 CLINICAL DATA:  Pt presented for follow-up of aortic valve disease. Pt scheduled to have valve replacement in December. Pt SOB, with oxygen saturations in 80's EXAM: CHEST  2 VIEW COMPARISON:  11/05/2015 FINDINGS: The heart is enlarged. There are patchy airspace opacities within the left mid lung zone and both lung bases. Pulmonary vascular congestion and mild interstitial edema are also noted. Left pleural effusion. IMPRESSION: 1. Changes consistent with congestive heart failure. 2. Stable left pleural effusion or pleural changes. 3. Confluent edema within the left mid lung zone and lung bases versus infectious infiltrates. Electronically Signed   By: Nolon Nations M.D.   On: 07/30/2016 18:44    Cardiac Studies   Right/Left Heart Cath and Coronary Angiography 07/31/2016 1. Severe aortic stenosis with a mean transaortic gradient of 67 mmHg and calculated aortic valve area of 0.77 cm 2. Diffusely calcified but nonobstructive coronary artery disease 3. Moderate pulmonary hypertension  The patient will continue evaluation for TAVR for treatment of his severe  aortic stenosis. He will be kept overnight for fluid hydration in the setting of stage IV chronic kidney disease. Will repeat a CBC and metabolic panel in the morning and check a chest x-ray and urinalysis to evaluate his leukocytosis.  Patient Profile   Wesley [name] is a 70 year old male with a past medical history of severe stage D aortic stenosis with mean transaortic gradient of 68mmHg, CKD stage IV, and HTN. Admitted for hydration prior to right and left heart cath in preparation for TAVR.   Assessment & Plan    1. Severe aortic stenosis: s/p L and R heart cath yesterday. Full report above.   2. Leukocytosis: Improved from yesterday. However chest x ray suggests infiltrate and patient has a productive cough. He is also slightly confused today. Will ask pharmacy to suggest an antibiotic given his CKD.   3. Chronic diastolic CHF  4. CKD stage IV: Stable post procedure.   Signed, Arbutus Leas, NP  07/26/2016, 8:53 AM

## 2016-07-27 ENCOUNTER — Encounter (HOSPITAL_COMMUNITY): Payer: Self-pay | Admitting: Pulmonary Disease

## 2016-07-27 ENCOUNTER — Inpatient Hospital Stay (HOSPITAL_COMMUNITY): Payer: Medicare Other

## 2016-07-27 DIAGNOSIS — I5032 Chronic diastolic (congestive) heart failure: Secondary | ICD-10-CM

## 2016-07-27 DIAGNOSIS — I5033 Acute on chronic diastolic (congestive) heart failure: Secondary | ICD-10-CM

## 2016-07-27 DIAGNOSIS — J9622 Acute and chronic respiratory failure with hypercapnia: Secondary | ICD-10-CM

## 2016-07-27 DIAGNOSIS — J810 Acute pulmonary edema: Secondary | ICD-10-CM

## 2016-07-27 DIAGNOSIS — J9621 Acute and chronic respiratory failure with hypoxia: Secondary | ICD-10-CM

## 2016-07-27 DIAGNOSIS — J690 Pneumonitis due to inhalation of food and vomit: Secondary | ICD-10-CM

## 2016-07-27 LAB — BLOOD GAS, ARTERIAL
ACID-BASE EXCESS: 7.1 mmol/L — AB (ref 0.0–2.0)
Bicarbonate: 33.4 mmol/L — ABNORMAL HIGH (ref 20.0–28.0)
DRAWN BY: 44135
O2 CONTENT: 4 L/min
O2 SAT: 88.4 %
PATIENT TEMPERATURE: 98.6
PCO2 ART: 70.7 mmHg — AB (ref 32.0–48.0)
pH, Arterial: 7.296 — ABNORMAL LOW (ref 7.350–7.450)
pO2, Arterial: 60 mmHg — ABNORMAL LOW (ref 83.0–108.0)

## 2016-07-27 LAB — BASIC METABOLIC PANEL
Anion gap: 8 (ref 5–15)
BUN: 59 mg/dL — AB (ref 6–20)
CHLORIDE: 100 mmol/L — AB (ref 101–111)
CO2: 31 mmol/L (ref 22–32)
CREATININE: 3.61 mg/dL — AB (ref 0.61–1.24)
Calcium: 8.8 mg/dL — ABNORMAL LOW (ref 8.9–10.3)
GFR, EST AFRICAN AMERICAN: 18 mL/min — AB (ref >60)
GFR, EST NON AFRICAN AMERICAN: 16 mL/min — AB (ref >60)
Glucose, Bld: 92 mg/dL (ref 65–99)
Potassium: 5 mmol/L (ref 3.5–5.1)
SODIUM: 139 mmol/L (ref 135–145)

## 2016-07-27 LAB — CBC WITH DIFFERENTIAL/PLATELET
BASOS ABS: 0 10*3/uL (ref 0.0–0.1)
BASOS PCT: 0 %
EOS ABS: 0.3 10*3/uL (ref 0.0–0.7)
EOS PCT: 2 %
HCT: 29.9 % — ABNORMAL LOW (ref 39.0–52.0)
HEMOGLOBIN: 8.7 g/dL — AB (ref 13.0–17.0)
LYMPHS ABS: 1.3 10*3/uL (ref 0.7–4.0)
Lymphocytes Relative: 11 %
MCH: 25.6 pg — AB (ref 26.0–34.0)
MCHC: 29.1 g/dL — ABNORMAL LOW (ref 30.0–36.0)
MCV: 87.9 fL (ref 78.0–100.0)
Monocytes Absolute: 1.2 10*3/uL — ABNORMAL HIGH (ref 0.1–1.0)
Monocytes Relative: 9 %
NEUTROS PCT: 78 %
Neutro Abs: 9.9 10*3/uL — ABNORMAL HIGH (ref 1.7–7.7)
PLATELETS: 253 10*3/uL (ref 150–400)
RBC: 3.4 MIL/uL — AB (ref 4.22–5.81)
RDW: 16.9 % — ABNORMAL HIGH (ref 11.5–15.5)
WBC: 12.7 10*3/uL — AB (ref 4.0–10.5)

## 2016-07-27 LAB — MRSA PCR SCREENING: MRSA BY PCR: NEGATIVE

## 2016-07-27 MED ORDER — IPRATROPIUM-ALBUTEROL 0.5-2.5 (3) MG/3ML IN SOLN: 3.0000 mL | Freq: Four times a day (QID) | RESPIRATORY_TRACT | Status: DC

## 2016-07-27 MED ORDER — FUROSEMIDE 80 MG PO TABS
80.0000 mg | ORAL_TABLET | Freq: Every day | ORAL | Status: DC
  Administered 2016-07-27: 80 mg via ORAL
  Filled 2016-07-27: qty 1

## 2016-07-27 MED ORDER — IPRATROPIUM-ALBUTEROL 0.5-2.5 (3) MG/3ML IN SOLN
3.0000 mL | Freq: Four times a day (QID) | RESPIRATORY_TRACT | Status: DC
  Administered 2016-07-27 (×2): 3 mL via RESPIRATORY_TRACT
  Filled 2016-07-27 (×2): qty 3

## 2016-07-27 MED ORDER — FUROSEMIDE 10 MG/ML IJ SOLN
40.0000 mg | Freq: Once | INTRAMUSCULAR | Status: AC
  Administered 2016-07-27: 40 mg via INTRAVENOUS
  Filled 2016-07-27: qty 4

## 2016-07-27 NOTE — Progress Notes (Signed)
Patient Name: Wesley [name] Date of Encounter: 07/27/2016  Primary Cardiologist: Dr. Tyrell Antonio Problem List     Principal Problem:   Aspiration pneumonia [facility]) Active Problems:   Essential hypertension   CKD (chronic kidney disease) stage 4, GFR 15-29 ml/min (HCC)   Aortic stenosis   Chronic respiratory failure with hypoxia and hypercapnia (HCC)   Chronic kidney disease (CKD), stage IV (severe) (HCC)   Anemia associated with chronic renal failure   Nonrheumatic aortic valve stenosis   Leukocytosis   Metabolic acidosis   Chronic diastolic congestive heart failure (HCC)   OSA (obstructive sleep apnea)     Subjective   Remains lethargic, denies SOB.   Inpatient Medications    Scheduled Meds: . amLODipine  10 mg Oral q morning - 10a  . aspirin EC  81 mg Oral q morning - 10a  . cholecalciferol  1,000 Units Oral Daily  . clindamycin (CLEOCIN) IV  600 mg Intravenous Q8H  . docusate sodium  100 mg Oral Daily  . gabapentin  300 mg Oral QHS  . galantamine  16 mg Oral QHS  . levothyroxine  150 mcg Oral BH-q7a  . pantoprazole  40 mg Oral BID  . pravastatin  20 mg Oral QHS  . QUEtiapine  150 mg Oral QHS  . sertraline  150 mg Oral QHS  . sodium bicarbonate  650 mg Oral BID  . sodium chloride flush  3 mL Intravenous Q12H  . tamsulosin  0.4 mg Oral QPC breakfast   Continuous Infusions:  PRN Meds: sodium chloride, acetaminophen, albuterol, ondansetron (ZOFRAN) IV, polyvinyl alcohol, sodium chloride flush   Vital Signs    Vitals:   07/26/16 1500 07/26/16 2100 07/27/16 0435 07/27/16 0609  BP: 107/65 120/71    Pulse:  78    Resp:  18 20   Temp: 97.6 F (36.4 C) 98.3 F (36.8 C) 98.7 F (37.1 C)   TempSrc: Oral Oral    SpO2: 92% 93%    Weight:    275 lb 1.6 oz (124.8 kg)  Height:        Intake/Output Summary (Last 24 hours) at 07/27/16 0645 Last data filed at 07/27/16 0610  Gross per 24 hour  Intake              340 ml  Output             1950 ml  Net             -1610 ml   Filed Weights   07/21/2016 0500 07/26/16 0455 07/27/16 0609  Weight: 269 lb 6.4 oz (122.2 kg) 274 lb 9.6 oz (124.6 kg) 275 lb 1.6 oz (124.8 kg)    Physical Exam    GEN: Well nourished, well developed, in no acute distress.  HEENT: Grossly normal.  Neck: Supple, no JVD, carotid bruits, or masses. Cardiac: RRR, 4/6 systolic murmurs, rubs, or gallops. No clubbing, cyanosis, edema.  Radials/DP/PT 2+ and equal bilaterally.  Respiratory:  Respirations regular and unlabored, clear to auscultation bilaterally. GI: Soft, nontender, nondistended, BS + x 4. MS: no deformity or atrophy. Skin: warm and dry, no rash. Neuro:  Strength and sensation are intact. Psych: AAOx3.  Normal affect.  Labs    CBC  Recent Labs  07/23/2016 1814  07/26/16 0339 07/27/16 0357  WBC 25.3*  < > 15.8* 12.7*  NEUTROABS 22.7*  --   --  9.9*  HGB 10.0*  < > 8.1* 8.7*  HCT 33.4*  < >  27.9* 29.9*  MCV 87.0  < > 88.0 87.9  PLT 295  < > 255 253  < > = values in this interval not displayed. Basic Metabolic Panel  Recent Labs  07/26/16 0339 07/27/16 0357  NA 141 139  K 4.7 5.0  CL 103 100*  CO2 31 31  GLUCOSE 111* 92  BUN 60* 59*  CREATININE 3.75* 3.61*  CALCIUM 8.6* 8.8*   Liver Function Tests  Recent Labs  08/07/2016 1814  AST 14*  ALT 9*  ALKPHOS 75  BILITOT 0.4  PROT 6.9  ALBUMIN 3.4*     Telemetry    NSR- Personally Reviewed  ECG    NSR - Personally Reviewed  Radiology    Dg Chest 2 View  Result Date: 07/24/2016 CLINICAL DATA:  Pt presented for follow-up of aortic valve disease. Pt scheduled to have valve replacement in December. Pt SOB, with oxygen saturations in 80's EXAM: CHEST  2 VIEW COMPARISON:  11/05/2015 FINDINGS: The heart is enlarged. There are patchy airspace opacities within the left mid lung zone and both lung bases. Pulmonary vascular congestion and mild interstitial edema are also noted. Left pleural effusion. IMPRESSION: 1. Changes consistent  with congestive heart failure. 2. Stable left pleural effusion or pleural changes. 3. Confluent edema within the left mid lung zone and lung bases versus infectious infiltrates. Electronically Signed   By: Nolon Nations M.D.   On: 07/20/2016 18:44   Dg Chest Port 1 View  Result Date: 07/27/2016 CLINICAL DATA:  Hypoxia tonight EXAM: PORTABLE CHEST 1 VIEW COMPARISON:  07/26/2016 FINDINGS: Unchanged marked cardiomegaly. Diffuse airspace opacities persist in the central and basilar regions bilaterally without significant interval change. IMPRESSION: Unchanged cardiomegaly and diffuse airspace opacities. This may represent alveolar edema associated with congestive heart failure. Electronically Signed   By: Andreas Newport M.D.   On: 07/27/2016 06:00   Dg Chest Port 1 View  Result Date: 07/26/2016 CLINICAL DATA:  Acute on chronic respiratory failure with hypoxia. Cough. EXAM: PORTABLE CHEST 1 VIEW COMPARISON:  08/06/2016 FINDINGS: Cardiomegaly remains stable.  Aortic atherosclerosis. Mixed interstitial and airspace disease is again seen bilaterally, suspicious for diffuse pulmonary edema. This shows no significant change. There is increased opacity in the left retrocardiac lung base, which may be due to atelectasis or superimposed pneumonia. No definite pleural effusion. IMPRESSION: Stable cardiomegaly and bilateral mixed interstitial and airspace disease, suspicious for diffuse pulmonary edema/congestive heart failure. Increased opacity in left retrocardiac lung base, which may be due to atelectasis or superimposed pneumonia. Electronically Signed   By: Earle Gell M.D.   On: 07/26/2016 12:18    Cardiac Studies   Right/Left Heart Cath and Coronary Angiography 08/09/2016 1. Severe aortic stenosis with a mean transaortic gradient of 67 mmHg and calculated aortic valve area of 0.77 cm 2. Diffusely calcified but nonobstructive coronary artery disease 3. Moderate pulmonary hypertension  The patient  will continue evaluation for TAVR for treatment of his severe aortic stenosis. He will be kept overnight for fluid hydration in the setting of stage IV chronic kidney disease. Will repeat a CBC and metabolic panel in the morning and check a chest x-ray and urinalysis to evaluate his leukocytosis.   Patient Profile     Mr. Pontarelli is a 70 year old male with a past medical history of severe stage D aortic stenosis with mean transaortic gradient of 70mmHg, CKD stage IV, and HTN. Admitted for hydration prior to right and left heart cath in preparation for TAVR.  Assessment & Plan    1. Severe aortic stenosis: s/p L and R heart cath. Full report above. Seen by TCTS yesterday.   2. Leukocytosis:  Improving.   3. Chronic diastolic CHF: Stable.   4. CKD stage IV: Stable post procedure.   Signed, Arbutus Leas, NP  07/27/2016, 6:45 AM   Patient seen, examined. Available data reviewed. Agree with findings, assessment, and plan as outlined by Jettie Booze, NP. The patient is independently interviewed and examined. He is well-known to me. At the time of my evaluation, he is awake and alert, sitting up in bed eating breakfast. He has no complaints and specifically denies chest pain or shortness of breath. On examination, JVP remains elevated, lungs with scattered rhonchi and rales but good air movement, heart is regular rate and rhythm with a grade 3/6 harsh late peaking systolic murmur with absent A2, no lower extremity edema. Clinically the patient appears to be at his baseline. He does have chronic respiratory failure on home oxygen. Appreciate both the care of Dr. Radford Pax and the hospitalist service. Dr. Ricard Dillon full consultation from yesterday is reviewed. Plans to continue with TAVR evaluation with CT angiography studies which will need to be spread out because of the patient's stage IV chronic kidney disease. Recommend starting him on furosemide 80 mg once daily to treat chronic diastolic heart  failure and volume excess. His renal function will be followed closely over the next several weeks until he is treated with TAVR. I think he is stable for hospital discharge with follow-up as planned.  [name], M.D. 07/27/2016 8:55 AM

## 2016-07-27 NOTE — Progress Notes (Signed)
Report given to the admitting nurse Kathleen Argue at [facility], All the questions were answered. Nurse stated that room is not ready yet. She will call back when room ready.  Ferdinand Lango, RN

## 2016-07-27 NOTE — Evaluation (Signed)
Clinical/Bedside Swallow Evaluation Patient Details  Name: Wesley [name] MRN: [id number] Date of Birth: August 06, 1946  Today's Date: 07/27/2016 Time: SLP Start Time (ACUTE ONLY): 1010 SLP Stop Time (ACUTE ONLY): 1045 SLP Time Calculation (min) (ACUTE ONLY): 35 min  Past Medical History:  Past Medical History:  Diagnosis Date  . Achalasia   . Anemia associated with chronic renal failure   . Aortic stenosis   . Chronic kidney disease    Stage IV  . Chronic kidney disease (CKD), stage IV (severe) ([place])   . Chronic respiratory failure with hypoxia ([place])   . CKD (chronic kidney disease) stage 4, GFR 15-29 ml/min (HCC)   . Complication of anesthesia   . Depression   . Difficult airway for intubation   . Difficult intubation   . Esophageal dysmotility   . Essential hypertension   . HTN (hypertension)   . Hypothyroidism   . Morbid obesity ([place])   . Obesity hypoventilation syndrome (HCC)    Intolerant of NIMV  . Oropharyngeal cancer ([place])   . 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 PTSD (post-traumatic stress disorder)   . Pulmonary hypertension    secondary  . S/P radiation therapy    Left tonsil: 70 Gy/ 35 Fractions   . TBI (traumatic brain injury) [facility])    Past Surgical History:  Past Surgical History:  Procedure Laterality Date  . CARDIAC CATHETERIZATION N/A 07/22/2016   Procedure: Right/Left Heart Cath and Coronary Angiography;  Surgeon: [name], MD;  Location: [place] [facility];  Service: Cardiovascular;  Laterality: N/A;  . CHONDROPLASTY  11/08/2002   extensive, of the patellofemoral joint  . CHONDROPLASTY  11/08/2002   lesser extent chondroplasty medial femoral condyle  . HELLER MYOTOMY Left 1987  . HIP ARTHROPLASTY Left   . KNEE ARTHROSCOPY  11/08/2002   right knee examination  . KNEE ARTHROSCOPY W/ PARTIAL MEDIAL MENISCECTOMY  11/08/2002  . 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 Of note patient was recently treated beginning on November 3 with 10 days of Cipro and Flagyl for acute diverticulitis and a currently is asymptomatic. Previous MBS (09/03/15) showed silent aspiration of thin liquids and Dys 3 diet with nectar thick liquids was recommended.   Assessment / Plan / Recommendation Clinical Impression  Pt presented with secretions in mouth prior to PO trials. Wife confirmed this is baseline given PMH. He consumed trials of nectar thick liquids with immediate and delayed throat clearing and coughing observed, despite consistent use of a chin tuck. Pt and wife reported that throat clearing is baseline; however, SLP noted wet vocal quality X1 that required cues for additional throat clears. Pureed and regular solids displayed no overt difficulty. Pt became more lethargic throughout the session. SLP believes MBS will be necessary for determining safest diet given h/o silent aspiration. Medical plan developing during SLP visit with likely transfer to ICU. Recommend he remain NPO except medication and small bites of puree solids for today. Will continue to follow acutely and plan for MBS as his medical status allows.  Aspiration Risk  Moderate aspiration risk    Diet Recommendation NPO except meds;Other (Comment) (allow small bites of puree with alert)   Medication Administration: Crushed with puree Supervision: Full supervision/cueing for compensatory strategies;Patient able to self  feed Compensations: Minimize environmental distractions;Slow rate;Small sips/bites Postural Changes: Seated upright at 90 degrees;Remain upright for at least 30 minutes after po intake    Other  Recommendations Oral Care Recommendations: Oral care QID   Follow up Recommendations Other (comment) (tba)      Frequency and Duration min 2x/week  2 weeks       Prognosis Prognosis for Safe Diet Advancement: Good Barriers to Reach Goals: Time post onset      Swallow Study   General HPI: Wesley [name] a 70 y.o.malewith medical history significant for 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 Of note patient was recently treated beginning on November 3 with 10 days of Cipro and Flagyl for acute diverticulitis and a currently is asymptomatic. Previous MBS (09/03/15) showed silent aspiration of thin liquids and Dys 3 diet with nectar thick liquids was recommended. Type of Study: Bedside Swallow Evaluation Previous Swallow Assessment: MBS (09/03/15) recommended Dys 3, nectar thick liquids Diet Prior to this Study: Dysphagia 3 (soft);Nectar-thick liquids Temperature Spikes Noted: No Respiratory Status: Nasal cannula History of Recent Intubation: No Behavior/Cognition: Cooperative;Requires cueing;Lethargic/Drowsy Oral Cavity Assessment: Excessive secretions Oral Care Completed by SLP: No Oral Cavity - Dentition: Poor condition Vision: Functional for self-feeding Self-Feeding Abilities: Needs assist;Able to feed self Patient Positioning: Upright  in bed Baseline Vocal Quality: Normal Volitional Cough: Weak    Oral/Motor/Sensory Function Overall Oral Motor/Sensory Function: Within functional limits   Ice Chips Ice chips: Not tested   Thin Liquid Thin Liquid: Not tested    Nectar Thick Nectar Thick Liquid: Impaired Presentation: Cup Pharyngeal Phase Impairments: Throat Clearing - Immediate;Throat Clearing - Delayed;Cough - Delayed;Wet Vocal Quality   Honey Thick Honey Thick Liquid: Not tested   Puree Puree: Within functional limits Presentation: Self Fed;Spoon   Solid   GO   Solid: Within functional limits Presentation: Self Fed;Spoon       Ezekiel Slocumb, Student SLP  Shela Leff 07/27/2016,12:10 PM

## 2016-07-27 NOTE — Progress Notes (Signed)
Shift event: RN paged because pt desatted into the low 80s while sleeping. Pt was on  O2 which he wears at home for chronic respiratory failure. Pt was slightly drowsy. CXR yesterday showed some possible edema and pt with crackles, so additional Lasix 40mg  IV given and ABG ordered. NP to bedside. Pt was alert on arrival without any complaints. He answers questions appropriately. No respiratory distress, no tachypnea. O2 sat now over 91%.  ABG showed slight acidosis at 7.29 and PCo2 of 70. O2 sat 88%. Looking back in chart, pt's ABGs have looked very similar.  ? Sleep apnea, but pt says he has been tested negative in the past. Continue tx plan.  CXR pending.  KJKG, NP [facility]

## 2016-07-27 NOTE — Progress Notes (Signed)
RT called RN with ABG results with panic value:  Ph: 7.296 PCO2: 70.7 PO2: 60.0 HCO3: 33.4 SO2: 88.4%

## 2016-07-27 NOTE — Progress Notes (Signed)
Approx 0445 pt started desating down to 86 on 3L/[place]. Upon entering room pt sleeping soundly-pt aroused easily but drowsy/ twitching to BUE. O2 sats cont to hover low 80's-pt assisted to sit up in bed/clear throat etc. Pt is oriented x4 but easily dozing back to sleep/will arouse to name. Temp 98.7 BP 125/69 . Lungs decreased T/O worse on Left/with fine crackles half way up. Tylene Fantasia NP paged and informed-IV lasix 40 IV ordered & given, Stat ABG ordered, PCXR, 50% venti-mask  Ordered however placed O2 4L/[place] and sats slowly increased to 90-91%. ABG results given to [employer]. Pt continues to be oriented/denies SOB or any other complaints. Discussion to possibly place on bipap briefly but will hold off for now and continue to monitor closely. Pts wife Santiago Glad called and updated on events-states pts does this at home as well and she increases oxygen for a short period and it resolves. Jessie Foot, RN
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
## 2016-07-27 NOTE — Progress Notes (Addendum)
PROGRESS NOTE  Wesley [name]  [id number] DOB: 01-01-1946  DOA: 08/11/2016 PCP: [name], MD   Brief Narrative:  70 year old male with PMH of squamous cell left tonsillar carcinoma now in remission, dysphagia with esophageal dysmotility and silent aspiration, chronic hypoxic respiratory failure secondary to recurrent aspiration pneumonia/OSA on home oxygen 3 L/m, COPD, HTN, BPH, depression, aortic stenosis, chronic diastolic CHF, stage IV chronic kidney disease, recently treated acute diverticulitis, initially admitted by cardiology service for elective cardiac catheterization in anticipation of TAVR for progressive severe AS. Post cath, patient noted to have leukocytosis of 25K, reported worsening productive cough and altered mental status. TRH was consulted on 07/26/16 and asked to assume primary care due to worsened respiratory status. On 07/27/16, concern of waxing and waning mental status associated with lethargy attributed to hypercapnic respiratory acidosis. CCM was consulted and transferred him to ICU under their service for further close monitoring and management. TRH signed off on 07/27/16-please re consult [place] for further assistance.   Assessment & Plan:   Principal Problem:   Aspiration pneumonia ([place]) Active Problems:   Essential hypertension   CKD (chronic kidney disease) stage 4, GFR 15-29 ml/min (HCC)   Aortic stenosis   Chronic respiratory failure with hypoxia and hypercapnia (HCC)   Chronic kidney disease (CKD), stage IV (severe) (HCC)   Anemia associated with chronic renal failure   Nonrheumatic aortic valve stenosis   Leukocytosis   Metabolic acidosis   Chronic diastolic congestive heart failure (HCC)   OSA (obstructive sleep apnea)   1. Acute encephalopathy: Discussion with patient's spouse on 07/27/16, this is his worst mental status over the last 1 year and reports waxing and waning alertness and somnolence since over the last 24-36 hours. Suspect this  is related to hypercapnia. CCM evaluated and have transferred him to ICU for further close monitoring and management. BiPAP was discussed with them but felt to be a poor option with increasing risk of aspiration from oral secretions. If intubation is required, will need awake intubation.  2. Chronic aspiration related to achalasia, squamous cell tonsillar cancer status post radiation treatment and dysphagia: Speech therapy evaluated and recommended nothing by mouth except medications and allow small bites of pure when alert. 3. Chronic hypoxic and hypercapnic respiratory failure related to chronic lung disease from chronic aspiration: Followed by outpatient pulmonology/Dr. Halford Chessman. Continue oxygen supplementation, aggressive pulmonary toilet, flutter valve, incentive spirometry, avoid sedative medications, attempt to avoid intubation as much as possible because patient has a difficult airway and will need awake intubation. Rest of management per CCM. 4. OSA/OHS: Intolerant of BiPAP. 5. Severe AS: Cardiology and thoracic surgery input appreciated and being worked up for TAVR 6. Essential hypertension: Controlled. 7. Stage IV chronic kidney disease: Follows with Dr. Justin Mend, nephrology as outpatient. Creatinine stable. Discontinued bicarbonate (serum bicarbonate is high). Baseline creatinine is not known. Last outpatient creatinine on 10/05/15:2.9. Admitted with creatinine of 3.87. Received pre-and post cath IV hydration. Creatinine has improved to 2.61. Monitor daily BMP. 8. Aspiration pneumonia: Continue clindamycin. History of chronic aspiration on multiple antibiotic courses recently. 9. Hypothyroid: Synthroid 10. History of tonsillar cancer status post radiation treatment. 11. History of PTSD: As discussed with Dr. Burt Knack, cardiology-patient apparently does not tolerate any kind of facemask well. As per spouse, briefly tolerated BiPAP in the past. 12. Leukocytosis: Continues to improve and has almost  resolved. 13. Acute on chronic diastolic CHF: Improved. Cardiology follow-up appreciated. 14. Anemia of chronic kidney disease: Stable. 15. Moderate pulmonary hypertension   DVT prophylaxis:  SCDs Code Status: Full Family Communication: Discussed in detail with patient's spouse at bedside. Disposition Plan: Transferred to ICU under CCM care on 07/27/16. Discharge disposition to be determined.   Consultants:   Cardiology  Cardiothoracic surgery  PCCM  Procedures:  07/30/2016 Right/Left Heart Cath and Coronary Angiography  Conclusion   1. Severe aortic stenosis with a mean transaortic gradient of 67 mmHg and calculated aortic valve area of 0.77 cm 2. Diffusely calcified but nonobstructive coronary artery disease 3. Moderate pulmonary hypertension  The patient will continue evaluation for TAVR for treatment of his severe aortic stenosis. He will be kept overnight for fluid hydration in the setting of stage IV chronic kidney disease. Will repeat a CBC and metabolic panel in the morning and check a chest x-ray and urinalysis to evaluate his leukocytosis.      Antimicrobials:   Clindamycin 11/14 >   Subjective: Discharge briefly reviewed. Overnight events noted. Waxing and waning mental status/alertness. Mass per discussion with spouse, as is the worst mental status that she has seen over the last 1 year. Patient himself denies complaints. Denies dyspnea.  Objective:  Vitals:   07/27/16 0609 07/27/16 0957 07/27/16 1000 07/27/16 1223  BP:  110/65 110/65   Pulse:      Resp:      Temp:    99.3 F (37.4 C)  TempSrc:    Oral  SpO2:   92%   Weight: 124.8 kg (275 lb 1.6 oz)     Height:      Pulse 78/m, respiratory rate 20 per minute.  Intake/Output Summary (Last 24 hours) at 07/27/16 1235 Last data filed at 07/27/16 0610  Gross per 24 hour  Intake              340 ml  Output             1950 ml  Net            -1610 ml   Filed Weights   07/26/2016 0500 07/26/16 0455  07/27/16 0609  Weight: 122.2 kg (269 lb 6.4 oz) 124.6 kg (274 lb 9.6 oz) 124.8 kg (275 lb 1.6 oz)    Examination:  General exam: Pleasant elderly male, chronically looking, lying comfortably propped up in bed, tends to intermittently sleep of but then wakes up to call and discuss coherent to all questioning. Respiratory system: Reduced breath sounds in the bases with scattered few bibasal crackles. No wheezing or rhonchi. Rest of lung fields clear.[name] Respiratory effort normal. Cardiovascular system: S1 & S2 heard, RRR. No JVD, rubs, gallops or clicks. Grade 3 x 6 lower ejection systolic murmur best heard at left lower sternal edge. No pedal edema. Telemetry: Sinus rhythm. Gastrointestinal system: Abdomen is nondistended, soft and nontender. No organomegaly or masses felt. Normal bowel sounds heard. Central nervous system: Mental status as indicated above. No focal neurological deficits. Asterixis +. Extremities: Symmetric 5 x 5 power. Skin: No rashes, lesions or ulcers Psychiatry: Judgement and insight appear impaired. Mood & affect appropriate.     Data Reviewed: I have personally reviewed following labs and imaging studies  CBC:  Recent Labs Lab 08/01/2016 1814 08/06/2016 0419 07/26/16 0339 07/27/16 0357  WBC 25.3* 20.5* 15.8* 12.7*  NEUTROABS 22.7*  --   --  9.9*  HGB 10.0* 9.5* 8.1* 8.7*  HCT 33.4* 32.4* 27.9* 29.9*  MCV 87.0 87.3 88.0 87.9  PLT 295 283 255 [id number]   Basic Metabolic Panel:  Recent Labs Lab 08/07/2016 1814 07/17/2016 0419 07/26/16 0339  07/27/16 0357  NA 139 141 141 139  K 4.4 4.7 4.7 5.0  CL 97* 101 103 100*  CO2 32 33* 31 31  GLUCOSE 129* 110* 111* 92  BUN 64* 59* 60* 59*  CREATININE 3.86* 3.70* 3.75* 3.61*  CALCIUM 9.7 9.3 8.6* 8.8*   GFR: Estimated Creatinine Clearance: 24.9 mL/min (by C-G formula based on SCr of 3.61 mg/dL (H)). Liver Function Tests:  Recent Labs Lab 08/06/2016 1814  AST 14*  ALT 9*  ALKPHOS 75  BILITOT 0.4  PROT 6.9  ALBUMIN  3.4*   No results for input(s): LIPASE, AMYLASE in the last 168 hours. No results for input(s): AMMONIA in the last 168 hours. Coagulation Profile:  Recent Labs Lab 08/08/2016 1814  INR 1.02   Cardiac Enzymes: No results for input(s): CKTOTAL, CKMB, CKMBINDEX, TROPONINI in the last 168 hours. BNP (last 3 results) No results for input(s): PROBNP in the last 8760 hours. HbA1C: No results for input(s): HGBA1C in the last 72 hours. CBG: No results for input(s): GLUCAP in the last 168 hours. Lipid Profile: No results for input(s): CHOL, HDL, LDLCALC, TRIG, CHOLHDL, LDLDIRECT in the last 72 hours. Thyroid Function Tests: No results for input(s): TSH, T4TOTAL, FREET4, T3FREE, THYROIDAB in the last 72 hours. Anemia Panel: No results for input(s): VITAMINB12, FOLATE, FERRITIN, TIBC, IRON, RETICCTPCT in the last 72 hours.  Sepsis Labs:  Recent Labs Lab 07/26/16 1057  PROCALCITON 1.25    No results found for this or any previous visit (from the past 240 hour(s)).       Radiology Studies: Dg Chest 2 View  Result Date: 07/27/2016 CLINICAL DATA:  Pt presented for follow-up of aortic valve disease. Pt scheduled to have valve replacement in December. Pt SOB, with oxygen saturations in 80's EXAM: CHEST  2 VIEW COMPARISON:  11/05/2015 FINDINGS: The heart is enlarged. There are patchy airspace opacities within the left mid lung zone and both lung bases. Pulmonary vascular congestion and mild interstitial edema are also noted. Left pleural effusion. IMPRESSION: 1. Changes consistent with congestive heart failure. 2. Stable left pleural effusion or pleural changes. 3. Confluent edema within the left mid lung zone and lung bases versus infectious infiltrates. Electronically Signed   By: Nolon Nations M.D.   On: 08/01/2016 18:44   Dg Chest Port 1 View  Result Date: 07/27/2016 CLINICAL DATA:  Hypoxia tonight EXAM: PORTABLE CHEST 1 VIEW COMPARISON:  07/26/2016 FINDINGS: Unchanged marked  cardiomegaly. Diffuse airspace opacities persist in the central and basilar regions bilaterally without significant interval change. IMPRESSION: Unchanged cardiomegaly and diffuse airspace opacities. This may represent alveolar edema associated with congestive heart failure. Electronically Signed   By: Andreas Newport M.D.   On: 07/27/2016 06:00   Dg Chest Port 1 View  Result Date: 07/26/2016 CLINICAL DATA:  Acute on chronic respiratory failure with hypoxia. Cough. EXAM: PORTABLE CHEST 1 VIEW COMPARISON:  08/04/2016 FINDINGS: Cardiomegaly remains stable.  Aortic atherosclerosis. Mixed interstitial and airspace disease is again seen bilaterally, suspicious for diffuse pulmonary edema. This shows no significant change. There is increased opacity in the left retrocardiac lung base, which may be due to atelectasis or superimposed pneumonia. No definite pleural effusion. IMPRESSION: Stable cardiomegaly and bilateral mixed interstitial and airspace disease, suspicious for diffuse pulmonary edema/congestive heart failure. Increased opacity in left retrocardiac lung base, which may be due to atelectasis or superimposed pneumonia. Electronically Signed   By: Earle Gell M.D.   On: 07/26/2016 12:18        Scheduled  Meds: . amLODipine  10 mg Oral q morning - 10a  . aspirin EC  81 mg Oral q morning - 10a  . cholecalciferol  1,000 Units Oral Daily  . clindamycin (CLEOCIN) IV  600 mg Intravenous Q8H  . docusate sodium  100 mg Oral Daily  . furosemide  80 mg Oral Daily  . gabapentin  300 mg Oral QHS  . galantamine  16 mg Oral QHS  . ipratropium-albuterol  3 mL Nebulization Q6H  . levothyroxine  150 mcg Oral BH-q7a  . pantoprazole  40 mg Oral BID  . pravastatin  20 mg Oral QHS  . QUEtiapine  150 mg Oral QHS  . sertraline  150 mg Oral QHS  . sodium chloride flush  3 mL Intravenous Q12H  . tamsulosin  0.4 mg Oral QPC breakfast   Continuous Infusions:   LOS: 1 day       Shakeerah Gradel, MD [facility] Pager [phone]  If 7PM-7AM, please contact night-coverage [email or web address] Password [facility] 07/27/2016, 12:35 PM

## 2016-07-27 NOTE — Care Management Note (Signed)
Case Management Note Original Note created by Michail Jewels 07/14/2016  Patient Details  Name: Wesley [name] MRN: [id number] Date of Birth: [birth year]  Subjective/Objective:  Pt presented for follow-up of aortic valve disease. Pt is from home with wife. Per pt he has DME 02 @ 3 L via  [place] and he uses a cane occasionally.                 Action/Plan: CM will continue to monitor for additional needs.   Expected Discharge Date:                  Expected Discharge Plan:  Home/Self Care  In-House Referral:  NA  Discharge planning Services  CM Consult  Post Acute Care Choice:    Choice offered to:     DME Arranged:    DME Agency:     HH Arranged:    HH Agency:     Status of Service:  In process, will continue to follow  If discussed at Long Length of Stay Meetings, dates discussed:    Additional Comments: 07/27/2016 [name], RN, BSN [phone] Pt was admitted for hydration prior to heart cath in prep for TVAR.  Pt developed pulm edema on the floor- it event advanced to hypercabia and pt was moved to ICU in an attempt to avoid intubation (difficult airway).  CM will continue to follow for discharge needs  [name], RN 07/27/2016, 3:03 PM

## 2016-07-28 ENCOUNTER — Encounter: Payer: Medicare Other | Admitting: Thoracic Surgery (Cardiothoracic Vascular Surgery)

## 2016-07-28 ENCOUNTER — Inpatient Hospital Stay (HOSPITAL_COMMUNITY): Payer: Medicare Other

## 2016-07-28 ENCOUNTER — Other Ambulatory Visit: Payer: Self-pay

## 2016-07-28 DIAGNOSIS — R0902 Hypoxemia: Secondary | ICD-10-CM | POA: Diagnosis present

## 2016-07-28 DIAGNOSIS — J810 Acute pulmonary edema: Secondary | ICD-10-CM

## 2016-07-28 DIAGNOSIS — J9621 Acute and chronic respiratory failure with hypoxia: Secondary | ICD-10-CM

## 2016-07-28 LAB — BASIC METABOLIC PANEL
Anion gap: 7 (ref 5–15)
BUN: 55 mg/dL — AB (ref 6–20)
CHLORIDE: 100 mmol/L — AB (ref 101–111)
CO2: 35 mmol/L — ABNORMAL HIGH (ref 22–32)
CREATININE: 3.8 mg/dL — AB (ref 0.61–1.24)
Calcium: 8.5 mg/dL — ABNORMAL LOW (ref 8.9–10.3)
GFR, EST AFRICAN AMERICAN: 17 mL/min — AB (ref >60)
GFR, EST NON AFRICAN AMERICAN: 15 mL/min — AB (ref >60)
Glucose, Bld: 110 mg/dL — ABNORMAL HIGH (ref 65–99)
Potassium: 4 mmol/L (ref 3.5–5.1)
SODIUM: 142 mmol/L (ref 135–145)

## 2016-07-28 LAB — CBC
HCT: 28.6 % — ABNORMAL LOW (ref 39.0–52.0)
HEMOGLOBIN: 8.4 g/dL — AB (ref 13.0–17.0)
MCH: 25.7 pg — AB (ref 26.0–34.0)
MCHC: 29.4 g/dL — AB (ref 30.0–36.0)
MCV: 87.5 fL (ref 78.0–100.0)
PLATELETS: 228 10*3/uL (ref 150–400)
RBC: 3.27 MIL/uL — AB (ref 4.22–5.81)
RDW: 16.9 % — ABNORMAL HIGH (ref 11.5–15.5)
WBC: 8.2 10*3/uL (ref 4.0–10.5)

## 2016-07-28 LAB — VAS US CAROTID
LCCAPDIAS: 30 cm/s
LEFT ECA DIAS: -14 cm/s
LEFT VERTEBRAL DIAS: 21 cm/s
LICADDIAS: -32 cm/s
LICADSYS: -102 cm/s
LICAPDIAS: -31 cm/s
LICAPSYS: -98 cm/s
Left CCA dist dias: 24 cm/s
Left CCA dist sys: 96 cm/s
Left CCA prox sys: 111 cm/s
RCCAPDIAS: 29 cm/s
RIGHT VERTEBRAL DIAS: 22 cm/s
Right CCA prox sys: 76 cm/s
Right cca dist sys: -96 cm/s

## 2016-07-28 LAB — PROCALCITONIN: PROCALCITONIN: 0.88 ng/mL

## 2016-07-28 LAB — PHOSPHORUS: PHOSPHORUS: 5 mg/dL — AB (ref 2.5–4.6)

## 2016-07-28 LAB — MAGNESIUM: MAGNESIUM: 2.3 mg/dL (ref 1.7–2.4)

## 2016-07-28 MED ORDER — IPRATROPIUM-ALBUTEROL 0.5-2.5 (3) MG/3ML IN SOLN
3.0000 mL | Freq: Four times a day (QID) | RESPIRATORY_TRACT | Status: DC
  Administered 2016-07-28 – 2016-08-05 (×32): 3 mL via RESPIRATORY_TRACT
  Filled 2016-07-28 (×31): qty 3

## 2016-07-28 MED ORDER — FUROSEMIDE 10 MG/ML IJ SOLN
80.0000 mg | Freq: Once | INTRAMUSCULAR | Status: AC
  Administered 2016-07-28: 80 mg via INTRAVENOUS
  Filled 2016-07-28: qty 8

## 2016-07-28 NOTE — Progress Notes (Signed)
    Subjective:  No chest pain or dyspnea. Pt complains of soreness from being in bed.  Objective:  Vital Signs in the last 24 hours: Temp:  [98.6 F (37 C)-100.3 F (37.9 C)] 100 F (37.8 C) (11/16 0400) Pulse Rate:  [79-104] 79 (11/16 0700) Resp:  [10-29] 10 (11/16 0700) BP: (101-149)/(59-99) 105/61 (11/16 0700) SpO2:  [78 %-100 %] 97 % (11/16 0700) FiO2 (%):  [45 %] 45 % (11/16 0300) Weight:  [275 lb 2.2 oz (124.8 kg)] 275 lb 2.2 oz (124.8 kg) (11/16 0500)  Intake/Output from previous day: 11/15 0701 - 11/16 0700 In: 250 [I.V.:100; IV Piggyback:150] Out: 2525 [Urine:2525]  Physical Exam: Pt is awake and alert, but intermittently falls asleep during my evaluation, arousable HEENT: normal Neck: JVP - elevated Lungs: scattered rales and rhonchi bilaterally CV: RRR with 3/6 late-peaking systolic murmur absent A2 Abd: soft, NT, Positive BS, no hepatomegaly Ext: trace edema bilaterally, distal pulses intact and equal Skin: warm/dry no rash   Lab Results:  Recent Labs  07/27/16 0357 07/28/16 0447  WBC 12.7* 8.2  HGB 8.7* 8.4*  PLT 253 228    Recent Labs  07/27/16 0357 07/28/16 0447  NA 139 142  K 5.0 4.0  CL 100* 100*  CO2 31 35*  GLUCOSE 92 110*  BUN 59* 55*  CREATININE 3.61* 3.80*   No results for input(s): TROPONINI in the last 72 hours.  Invalid input(s): CK, MB  Tele: Sinus rhythm  Assessment/Plan:  1. Acute on chronic diastolic CHF - Stage C, NYHA III. 2. Severe aortic stenosis 3. Acute on chronic hypercarbic respiratory failure 4. CKD 4 - stable 5. Chronic aspiration pneumonia  Overall appears stable. CXR with diffuse opacities possible edema on background of chronic aspiration. Appreciate CCM care. With stable renal fxn will give IV lasix this am. Negative fluid balance to date since admission 5L. He has chronic hypercarbia, does not tolerate CPAP or positive pressure. Plans for continued TAVR evaluation as outpatient - CTA studies need to be  spread out with his advanced kidney disease.  [name], M.D. 07/28/2016, 7:55 AM

## 2016-07-28 NOTE — Progress Notes (Signed)
Modified Barium Swallow Progress Note  Patient Details  Name: Wesley [name] MRN: [id number] Date of Birth: [birth year]  Today's Date: 07/28/2016  Modified Barium Swallow completed.  Full report located under Chart Review in the Imaging Section.  Brief recommendations include the following:  Clinical Impression  Pt presents with severe pharyngeal dysphagia. A delay in swallow initiation to the valleculae with reduced laryngeal closure and epiglottic inversion was seen for all PO consistencies. Despite the use of a chin tuck, thin and nectar thick liquids caused silent aspiration/penetration. Sensed aspiration occurred with pureed solids likely mixed with residue from honey thick liquids. Chin tuck was implemented for small tsp of puree, initially showing improved airway protection. Pharyngeal residue was diffuse, but alternating between purees and tsp of honey thick liquids appeared to aid in minimizing residue. Intermittent penetration of residuals occurred but could be cleared with cued throat clear. Pt remains at risk for aspiration with all PO consistencies, although this can be mitigated with Dys 1 diet and honey thick liquids by spoon, using a chin tuck, double swallows, and intermittent throat clearing. Pt and SLP discussed dietary modifications at length and pt stated he did not want a feeding tube. He is agreeable to trial this modified diet, although at this time is it not clear if this is a progression of his baseline dysphagia versus a more acute exacerbation during current hospitalization. SLP provided education about the risks of aspiration, should he decide in the future to not continue with a modified diet. Will continue to follow for diet tolerance and education.   Swallow Evaluation Recommendations       SLP Diet Recommendations: Dysphagia 1 (Puree) solids;Honey thick liquids   Liquid Administration via: Spoon   Medication Administration: Crushed with puree   Supervision:  Patient able to self feed;Full supervision/cueing for compensatory strategies   Compensations: Minimize environmental distractions;Slow rate;Small sips/bites;Chin tuck;Clear throat intermittently;Follow solids with liquid;Multiple dry swallows after each bite/sip   Postural Changes: Remain semi-upright after after feeds/meals (Comment);Seated upright at 90 degrees   Oral Care Recommendations: Oral care BID   Other Recommendations: Order thickener from pharmacy;Prohibited food (jello, ice cream, thin soups);Remove water pitcher   Ezekiel Slocumb, Student SLP  Shela Leff 07/28/2016,2:59 PM

## 2016-07-28 NOTE — Progress Notes (Signed)
Preliminary results by tech - Carotid Duplex Completed. Mild atherosclerotic plaque noted in both ICAs without significant stenosis. Vertebral arteries demonstrated antegrade flow. Oda Cogan, BS, RDMS, RVT

## 2016-07-28 NOTE — Evaluation (Signed)
Physical Therapy Evaluation/ Pre-TAVR Patient Details Name: Wesley [name] MRN: [id number] DOB: 1946-08-30 Today's Date: 07/28/2016   History of Present Illness  70 yo admitted with pulmonary edema. PMhx: tonsillar CA, HTN, AS, CHF, CKD  Clinical Impression  Pt initially lethargic with frequent drifting back to sleep during questioning but once EOB able to maintain arousal and answer all questions. Pt with fatigue with gait as wll as need for increased oxygen demand with activity from 6-8L to maintain sats above 90%. Pt completed pre-TAVR assessment with results below. Pt with decreased activity tolerance, balance and gait who will benefit from acute therapy to maximize independence prior to D/C.   Pre-TAVR Pre BP 116/69, HR 76, sats 98% on 6L Post BP 122/64, HR 100, 92% on 8L 5 meter walk times: 10.93, 9.32, 9.32 : average=9.86 6 min walk test = 300 feet Borg dyspnea 0 throughout Borg RPE 11 at 150', 15 at 225' Clinical frailty pt rated himself at 3     Follow Up Recommendations Home health PT    Equipment Recommendations  None recommended by PT    Recommendations for Other Services       Precautions / Restrictions Precautions Precautions: Fall Precaution Comments: watch sats      Mobility  Bed Mobility Overal bed mobility: Needs Assistance Bed Mobility: Supine to Sit;Sit to Supine     Supine to sit: Supervision Sit to supine: Supervision   General bed mobility comments: supervision for safety and lines  Transfers Overall transfer level: Needs assistance   Transfers: Sit to/from Stand Sit to Stand: Min guard         General transfer comment: guarding for safety and balance  Ambulation/Gait Ambulation/Gait assistance: Min guard Ambulation Distance (Feet): 300 Feet Assistive device: Rolling walker (2 wheeled) Gait Pattern/deviations: Step-through pattern;Decreased stride length;Trunk flexed   Gait velocity interpretation: Below normal speed for  age/gender General Gait Details: cues for posture, position in RW, breathing technique and directional cues, 2 standing rest breaks  Stairs            Wheelchair Mobility    Modified Rankin (Stroke Patients Only)       Balance Overall balance assessment: Needs assistance   Sitting balance-Leahy Scale: Good       Standing balance-Leahy Scale: Fair                               Pertinent Vitals/Pain Pain Assessment: No/denies pain    Home Living Family/patient expects to be discharged to:: Private residence Living Arrangements: Spouse/significant other Available Help at Discharge: Family (lives with his wife who does household activities) Type of Home: House Home Access: Stairs to enter (railing on both sides ) Entrance Stairs-Rails: Can reach both Entrance Stairs-Number of Steps: 3  Home Layout: Two level Home Equipment: Shower seat;Cane - single point;Walker - standard      Prior Function Level of Independence: Independent         Comments: 3 O2 at home      Hand Dominance        Extremity/Trunk Assessment   Upper Extremity Assessment: Generalized weakness           Lower Extremity Assessment: Generalized weakness      Cervical / Trunk Assessment: Kyphotic  Communication   Communication: No difficulties  Cognition Arousal/Alertness: Awake/alert (initially lethargic with need to sit to maintain arousal) Behavior During Therapy: WFL for tasks assessed/performed Overall Cognitive Status: Within Functional  Limits for tasks assessed                      General Comments      Exercises     Assessment/Plan    PT Assessment Patient needs continued PT services  PT Problem List Decreased activity tolerance;Decreased mobility;Cardiopulmonary status limiting activity;Decreased balance;Decreased knowledge of use of DME          PT Treatment Interventions Gait training;Therapeutic activities;Patient/family education;DME  instruction;Functional mobility training    PT Goals (Current goals can be found in the Care Plan section)  Acute Rehab PT Goals Patient Stated Goal: work on classic cars PT Goal Formulation: With patient Time For Goal Achievement: 08/04/16 Potential to Achieve Goals: Good    Frequency Min 3X/week   Barriers to discharge        Co-evaluation               End of Session Equipment Utilized During Treatment: Gait belt;Oxygen Activity Tolerance: Patient tolerated treatment well Patient left: in bed;with call bell/phone within reach;with bed alarm set Nurse Communication: Mobility status         Time: [id number] PT Time Calculation (min) (ACUTE ONLY): 32 min   Charges:   PT Evaluation $PT Eval Moderate Complexity: 1 Procedure PT Treatments $Gait Training: 8-22 mins   PT G Codes:        Chloe Baig B Lismary Kiehn August 18, 2016, 8:47 AM  Elwyn Reach, [place]
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
## 2016-07-28 NOTE — Progress Notes (Signed)
Pt back for swallow study, vss, diet placed for dys 1  Wesley [name] S 12:04 PM

## 2016-07-29 ENCOUNTER — Encounter (HOSPITAL_COMMUNITY): Payer: Medicare Other

## 2016-07-29 ENCOUNTER — Telehealth (INDEPENDENT_AMBULATORY_CARE_PROVIDER_SITE_OTHER): Payer: Self-pay

## 2016-07-29 ENCOUNTER — Other Ambulatory Visit: Payer: Self-pay

## 2016-07-29 DIAGNOSIS — G4733 Obstructive sleep apnea (adult) (pediatric): Secondary | ICD-10-CM

## 2016-07-29 DIAGNOSIS — I10 Essential (primary) hypertension: Secondary | ICD-10-CM

## 2016-07-29 DIAGNOSIS — J9601 Acute respiratory failure with hypoxia: Secondary | ICD-10-CM

## 2016-07-29 DIAGNOSIS — E872 Acidosis: Secondary | ICD-10-CM

## 2016-07-29 LAB — BASIC METABOLIC PANEL
ANION GAP: 11 (ref 5–15)
BUN: 61 mg/dL — ABNORMAL HIGH (ref 6–20)
CALCIUM: 8.4 mg/dL — AB (ref 8.9–10.3)
CHLORIDE: 94 mmol/L — AB (ref 101–111)
CO2: 34 mmol/L — AB (ref 22–32)
Creatinine, Ser: 4.39 mg/dL — ABNORMAL HIGH (ref 0.61–1.24)
GFR calc non Af Amer: 12 mL/min — ABNORMAL LOW (ref >60)
GFR, EST AFRICAN AMERICAN: 14 mL/min — AB (ref >60)
Glucose, Bld: 105 mg/dL — ABNORMAL HIGH (ref 65–99)
POTASSIUM: 4.6 mmol/L (ref 3.5–5.1)
Sodium: 139 mmol/L (ref 135–145)

## 2016-07-29 LAB — CBC
HEMATOCRIT: 29.1 % — AB (ref 39.0–52.0)
HEMOGLOBIN: 8.6 g/dL — AB (ref 13.0–17.0)
MCH: 25.9 pg — AB (ref 26.0–34.0)
MCHC: 29.6 g/dL — ABNORMAL LOW (ref 30.0–36.0)
MCV: 87.7 fL (ref 78.0–100.0)
Platelets: 263 10*3/uL (ref 150–400)
RBC: 3.32 MIL/uL — AB (ref 4.22–5.81)
RDW: 16.9 % — ABNORMAL HIGH (ref 11.5–15.5)
WBC: 8.4 10*3/uL (ref 4.0–10.5)

## 2016-07-29 LAB — MAGNESIUM: MAGNESIUM: 2.3 mg/dL (ref 1.7–2.4)

## 2016-07-29 LAB — PHOSPHORUS: Phosphorus: 6.2 mg/dL — ABNORMAL HIGH (ref 2.5–4.6)

## 2016-07-29 MED ORDER — SENNA 8.6 MG PO TABS
1.0000 | ORAL_TABLET | Freq: Every day | ORAL | Status: DC
  Administered 2016-07-29 – 2016-07-31 (×3): 8.6 mg via ORAL
  Filled 2016-07-29 (×3): qty 1

## 2016-07-29 MED ORDER — ASPIRIN 81 MG PO CHEW
81.0000 mg | CHEWABLE_TABLET | Freq: Every day | ORAL | Status: DC
  Administered 2016-07-29 – 2016-07-31 (×3): 81 mg via ORAL
  Filled 2016-07-29 (×3): qty 1

## 2016-07-29 MED ORDER — OXYCODONE HCL 5 MG PO TABS
5.0000 mg | ORAL_TABLET | Freq: Once | ORAL | Status: AC
  Administered 2016-07-29: 5 mg via ORAL
  Filled 2016-07-29: qty 1

## 2016-07-29 MED ORDER — TRAMADOL HCL 50 MG PO TABS
50.0000 mg | ORAL_TABLET | Freq: Two times a day (BID) | ORAL | Status: DC | PRN
  Administered 2016-07-29: 50 mg via ORAL
  Filled 2016-07-29: qty 1

## 2016-07-29 MED ORDER — RESOURCE THICKENUP CLEAR PO POWD
ORAL | Status: DC | PRN
  Filled 2016-07-29: qty 125

## 2016-07-29 MED ORDER — TRAMADOL HCL 50 MG PO TABS
50.0000 mg | ORAL_TABLET | Freq: Four times a day (QID) | ORAL | Status: DC | PRN
  Administered 2016-07-29 – 2016-07-30 (×3): 50 mg via ORAL
  Filled 2016-07-29 (×3): qty 1

## 2016-07-29 NOTE — Care Management Note (Signed)
Case Management Note Original Note created by Michail Jewels 07/18/2016  Patient Details  Name: Wesley [name] MRN: [id number] Date of Birth: 1945-11-02  Subjective/Objective:  Pt presented for follow-up of aortic valve disease. Pt is from home with wife. Per pt he has DME 02 @ 3 L via  [place] and he uses a cane occasionally.                 Action/Plan: CM will continue to monitor for additional needs.   Expected Discharge Date:                  Expected Discharge Plan:  Home/Self Care  In-House Referral:  NA  Discharge planning Services  CM Consult  Post Acute Care Choice:    Choice offered to:     DME Arranged:    DME Agency:     HH Arranged:    HH Agency:     Status of Service:  In process, will continue to follow  If discussed at Long Length of Stay Meetings, dates discussed:    Additional Comments: 07/29/2016  Pt assessed with wife at bedside.  Pt and wife would like [place] for [facility] once order is written - no arrangements have been made  07/27/16 [name], RN, BSN [phone] Pt was admitted for hydration prior to heart cath in prep for TVAR.  Pt developed pulm edema on the floor- it event advanced to hypercabia and pt was moved to ICU in an attempt to avoid intubation (difficult airway).  CM will continue to follow for discharge needs  [name], RN 07/29/2016, 4:52 PM

## 2016-07-29 NOTE — Telephone Encounter (Signed)
Patient is requesting another injection. Had Rt S1 TF injection on 12/21/15. Ok to repeat?

## 2016-07-29 NOTE — Progress Notes (Signed)
Progress Note    Wesley [name]  [id number] DOB: 06-16-1946  DOA: 08/04/2016 PCP: Wesley Pac, MD    Brief Narrative:   Chief complaint: Follow-up aspiration pneumonia  Wesley [name] is an 70 y.o. male with a PMH of achalasia status post surgical intervention approximately 30 years ago with resultant residual dysphasia, chronic aspiration, tonsillar cancer, stage IV chronic kidney disease, and severe aortic stenosis who was admitted 08/01/2016 for preoperative cardiac catheterization in anticipation of undergoing TAVR. On 07/27/16, pulmonology was consulted due to worsening respiratory failure in the setting of pulmonary edema and worsening hypercarbia. Patient was under the care of PCCM until 07/29/16.  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 Chest radiography showed edema versus infectious infiltrate and WBC was elevated. Initially placed on Levaquin, currently on clindamycin. Found to be hypoxic hypercarbic on ABG. Evaluated by speech therapist 07/27/16 and found to have moderate aspiration risk with recommendations of nothing by mouth status. Diet subsequently advanced to dysphasia one on 07/28/16 with honey thickened liquids. Continue bronchodilators, flutter valve and incentive spirometry. Follow-up blood cultures.  Active Problems:   Acute encephalopathy Appears to be resolving. Minimize sedating medications.    Essential hypertension Norvasc on hold secondary to soft blood pressures.    CKD (chronic kidney disease) stage 4, GFR 15-29 ml/min (HCC)/metabolic acidosis/hyperphosphatemia Admission creatinine was 3.7 and he was hydrated in anticipation of undergoing heart catheterization  07/26/16. Creatinine rising post procedure. GFR 14. Monitor closely. Follows with Dr. Justin [name] of nephrology as an outpatient. Hold further Lasix unless absolutely needed. Avoid nephrotoxins.    Anemia associated with chronic renal failure Treated with Aranesp chronically. Hemoglobin stable in the 8 range.    Nonrheumatic aortic valve stenosis/diffusely calcified but nonobstructive CAD Severe aortic stenosis with a mean transaortic gradient of 67 mmHg and calculated aortic valve area of 0.77 cm. Underwent cardiac catheterization 07/22/2016 in anticipation of TAVR. Nonobstructive CAD noted on cath. Continue aspirin/statin.    Leukocytosis Likely secondary to aspiration pneumonia.    Acute on chronic diastolic congestive heart failure (HCC) IV Lasix initiated 07/27/16 secondary to decompensated CHF and volume overload.    OSA (obstructive sleep apnea) Noted to be intolerant of BiPAP.    Hypothyroidism Continue Synthroid.    Back pain (chronic) Unresolved with Tylenol. Chronic. We'll try Ultram but need to be mindful of oversedation. Orthopedic doctor gives him steroid injections. Dr. Ernestina Patches [facility] orthopedics).   Family Communication/Anticipated D/C date and plan/Code Status   DVT prophylaxis: SCDs ordered. Code Status: Partial Code.  Family Communication: Wife updated by telephone. Disposition Plan: Home when creatinine stable.    Medical Consultants:    PCCM  CVTS   Procedures:    Carotid Dopplers 07/28/16: Mild atherosclerotic plaque in both ICAs without significant stenosis.  Anti-Infectives:    None  Subjective:   The patient reports that he has a dull aching lower backache, but Ultram eased it off. Review of symptoms is positive for constipation and negative for dyspnea, chest pain, nausea/vomiting.  Objective:    Vitals:   07/29/16 0600 07/29/16 0700 07/29/16 0750 07/29/16 0800  BP: (!) 124/112 (!) 141/78  133/82  Pulse: 89 89  91  Resp: 16 17  16     Temp:  99.2 F (37.3 C)    TempSrc:  Oral  SpO2: 99% 95% 94% 93%  Weight: 123 kg (271 lb 2.7 oz)     Height:        Intake/Output Summary (Last 24 hours) at 07/29/16 0846 Last data filed at 07/29/16 0800  Gross per 24 hour  Intake             1960 ml  Output             1775 ml  Net              185 ml   Filed Weights   07/27/16 0609 07/28/16 0500 07/29/16 0600  Weight: 124.8 kg (275 lb 1.6 oz) 124.8 kg (275 lb 2.2 oz) 123 kg (271 lb 2.7 oz)   Tele: NSR  Exam: General exam: Appears calm and comfortable, but easily falls asleep.  Respiratory system: Scattered expiratory wheezes/rhonchi. Respiratory effort normal. Cardiovascular system: S1 & S2 heard, RRR. + JVD,  rubs, gallops or clicks. III/VI SEM loudest at RUSB. Gastrointestinal system: Abdomen is nondistended, soft and nontender. No organomegaly or masses felt. Normal bowel sounds heard. Central nervous system: Alert and oriented. No focal neurological deficits. Extremities: No clubbing,  or cyanosis. Trace edema. Skin: No rashes, lesions or ulcers. Psychiatry: Judgement and insight appear normal. Mood & affect flat.   Data Reviewed:   I have personally reviewed following labs and imaging studies:  Labs: Basic Metabolic Panel:  Recent Labs Lab 07/18/2016 0419 07/26/16 0339 07/27/16 0357 07/28/16 0447 07/29/16 0410  NA 141 141 139 142 139  K 4.7 4.7 5.0 4.0 4.6  CL 101 103 100* 100* 94*  CO2 33* 31 31 35* 34*  GLUCOSE 110* 111* 92 110* 105*  BUN 59* 60* 59* 55* 61*  CREATININE 3.70* 3.75* 3.61* 3.80* 4.39*  CALCIUM 9.3 8.6* 8.8* 8.5* 8.4*  MG  --   --   --  2.3 2.3  PHOS  --   --   --  5.0* 6.2*   GFR Estimated Creatinine Clearance: 20.3 mL/min (by C-G formula based on SCr of 4.39 mg/dL (H)). Liver Function Tests:  Recent Labs Lab 07/13/2016 1814  AST 14*  ALT 9*  ALKPHOS 75  BILITOT 0.4  PROT 6.9  ALBUMIN 3.4*   Coagulation profile  Recent Labs Lab 07/15/2016 1814  INR 1.02     CBC:  Recent Labs Lab 08/04/2016 1814 08/04/2016 0419 07/26/16 0339 07/27/16 0357 07/28/16 0447 07/29/16 0410  WBC 25.3* 20.5* 15.8* 12.7* 8.2 8.4  NEUTROABS 22.7*  --   --  9.9*  --   --   HGB 10.0* 9.5* 8.1* 8.7* 8.4* 8.6*  HCT 33.4* 32.4* 27.9* 29.9* 28.6* 29.1*  MCV 87.0 87.3 88.0 87.9 87.5 87.7  PLT 295 283 255 253 228 263   Sepsis Labs:  Recent Labs Lab 07/26/16 0339 07/26/16 1057 07/27/16 0357 07/28/16 0447 07/29/16 0410  PROCALCITON  --  1.25  --  0.88  --   WBC 15.8*  --  12.7* 8.2 8.4    Microbiology Recent Results (from the past 240 hour(s))  Culture, blood (Routine X 2) w Reflex to ID Panel     Status: None (Preliminary result)   Collection Time: 07/26/16 10:57 AM  Result Value Ref Range Status   Specimen Description BLOOD RIGHT ANTECUBITAL  Final   Special Requests IN PEDIATRIC BOTTLE 3CC  Final   Culture NO GROWTH 2 DAYS  Final   Report Status PENDING  Incomplete  Culture, blood (Routine X 2) w Reflex to ID Panel  Status: None (Preliminary result)   Collection Time: 07/26/16 11:01 AM  Result Value Ref Range Status   Specimen Description BLOOD RIGHT HAND  Final   Special Requests IN PEDIATRIC BOTTLE 2CC  Final   Culture NO GROWTH 2 DAYS  Final   Report Status PENDING  Incomplete  MRSA PCR Screening     Status: None   Collection Time: 07/27/16  2:49 PM  Result Value Ref Range Status   MRSA by PCR NEGATIVE NEGATIVE Final    Comment:        The GeneXpert MRSA Assay (FDA approved for NASAL specimens only), is one component of a comprehensive MRSA colonization surveillance program. It is not intended to diagnose MRSA infection nor to guide or monitor treatment for MRSA infections.     Radiology: Dg Chest Port 1 View  Result Date: 07/28/2016 CLINICAL DATA:  Patient with acute respiratory failure.  Hypoxia. EXAM: PORTABLE CHEST 1 VIEW COMPARISON:  Chest radiograph 07/27/2016. FINDINGS: Monitoring leads overlie the patient. Patient is  rotated to the left. Stable enlarged cardiac and mediastinal contours. Grossly unchanged left-greater-than-right mid lower lung airspace opacities. Probable small left pleural effusion. IMPRESSION: Cardiomegaly. Grossly unchanged bilateral airspace opacities and probable small left pleural effusion. Electronically Signed   By: Lovey Newcomer M.D.   On: 07/28/2016 09:11   Dg Swallowing Func-speech Pathology  Result Date: 07/28/2016 Objective Swallowing Evaluation: Type of Study: MBS-Modified Barium Swallow Study Patient Details Name: CYPRESS TINNES MRN: [id number] Date of Birth: 1946/01/22 Today's Date: 07/28/2016 Time: SLP Start Time (ACUTE ONLY): 1125-SLP Stop Time (ACUTE ONLY): 1145 SLP Time Calculation (min) (ACUTE ONLY): 20 min Past Medical History: Past Medical History: Diagnosis Date . Achalasia  . Anemia associated with chronic renal failure  . Aortic stenosis  . Chronic kidney disease   Stage IV . Chronic kidney disease (CKD), stage IV (severe) ([place])  . Chronic respiratory failure with hypoxia ([place])  . CKD (chronic kidney disease) stage 4, GFR 15-29 ml/min (HCC)  . Complication of anesthesia  . Depression  . Difficult airway for intubation  . Difficult intubation  . Esophageal dysmotility  . Essential hypertension  . HTN (hypertension)  . Hypothyroidism  . Morbid obesity ([place])  . Obesity hypoventilation syndrome (HCC)   Intolerant of NIMV . Oropharyngeal cancer ([place])  . Oropharyngeal cancer (HCC)   squamous cell . PTSD (post-traumatic stress disorder)  . Pulmonary hypertension   secondary . S/P radiation therapy   Left tonsil: 70 Gy/ 35 Fractions  . TBI (traumatic brain injury) [facility])  Past Surgical History: Past Surgical History: Procedure Laterality Date . CARDIAC CATHETERIZATION N/A 08/02/2016  Procedure: Right/Left Heart Cath and Coronary Angiography;  Surgeon: [name], MD;  Location: [facility];  Service: Cardiovascular;  Laterality: N/A; . CHONDROPLASTY  11/08/2002  extensive, of the  patellofemoral joint . CHONDROPLASTY  11/08/2002  lesser extent chondroplasty medial femoral condyle . HELLER MYOTOMY Left 1987 . HIP ARTHROPLASTY Left  . KNEE ARTHROSCOPY  11/08/2002  right knee examination . KNEE ARTHROSCOPY W/ PARTIAL MEDIAL MENISCECTOMY  11/08/2002 . LEG REPLANTATION Right  HPI: Wesley Poovey Cannonis a 70 y.o.malewith medical history significant for prior cT3 N2a M0 squamous cell carcinoma of the left tonsilnow in remission, dysphagia with esophageal dysmotility and silent aspiration, chronic hypoxemic respiratory failure secondary to recurrent aspiration pneumonia/OSA,hypertension, BPH, depression, known aortic stenosis with associated chronic diastolic heart failure, chronic kidney disease stage IV who presented to the hospital to undergo elective cardiac catheterization in anticipation of undergoing TAVRprocedure on  December 12th. Of note patient was recently treated beginning on November 3 with 10 days of Cipro and Flagyl for acute diverticulitis and a currently is asymptomatic. Previous MBS (09/03/15) showed silent aspiration of thin liquids and Dys 3 diet with nectar thick liquids was recommended. Subjective: pt alert, cooperative Assessment / Plan / Recommendation CHL IP CLINICAL IMPRESSIONS 07/28/2016 Therapy Diagnosis Severe pharyngeal phase dysphagia Clinical Impression Pt presents with severe pharyngeal dysphagia. A delay in swallow initiation to the valleculae with reduced laryngeal closure and epiglottic inversion was seen for all PO consistencies. Despite the use of a chin tuck, thin and nectar thick liquids caused silent aspiration/penetration. Sensed aspiration occurred with pureed solids likely mixed with residue from honey thick liquids. Chin tuck was implemented for small tsp of puree, initially showing improved airway protection. Pharyngeal residue was diffuse, but alternating between purees and tsp of honey thick liquids appeared to aid in minimizing residue. Intermittent  penetration of residuals occurred but could be cleared with cued throat clear. Pt remains at risk for aspiration with all PO consistencies, although this can be mitigated with Dys 1 diet and honey thick liquids by spoon, using a chin tuck, double swallows, and intermittent throat clearing. Pt and SLP discussed dietary modifications at length and pt stated he did not want a feeding tube. He is agreeable to trial this modified diet, although at this time is it not clear if this is a progression of his baseline dysphagia versus a more acute exacerbation during current hospitalization. SLP provided education about the risks of aspiration, should he decide in the future to not continue with a modified diet. Will continue to follow for diet tolerance and education. Impact on safety and function Severe aspiration risk   CHL IP TREATMENT RECOMMENDATION 07/28/2016 Treatment Recommendations Therapy as outlined in treatment plan below   Prognosis 07/28/2016 Prognosis for Safe Diet Advancement Good Barriers to Reach Goals Severity of deficits Barriers/Prognosis Comment -- CHL IP DIET RECOMMENDATION 07/28/2016 SLP Diet Recommendations Dysphagia 1 (Puree) solids;Honey thick liquids Liquid Administration via Spoon Medication Administration Crushed with puree Compensations Minimize environmental distractions;Slow rate;Small sips/bites;Chin tuck;Clear throat intermittently;Follow solids with liquid;Multiple dry swallows after each bite/sip Postural Changes Remain semi-upright after after feeds/meals (Comment);Seated upright at 90 degrees   CHL IP OTHER RECOMMENDATIONS 07/28/2016 Recommended Consults -- Oral Care Recommendations Oral care BID Other Recommendations Order thickener from pharmacy;Prohibited food (jello, ice cream, thin soups);Remove water pitcher   CHL IP FOLLOW UP RECOMMENDATIONS 07/28/2016 Follow up Recommendations Other (comment)   CHL IP FREQUENCY AND DURATION 07/28/2016 Speech Therapy Frequency (ACUTE ONLY) min  2x/week Treatment Duration 2 weeks      CHL IP ORAL PHASE 07/28/2016 Oral Phase WFL Oral - Pudding Teaspoon -- Oral - Pudding Cup -- Oral - Honey Teaspoon -- Oral - Honey Cup -- Oral - Nectar Teaspoon -- Oral - Nectar Cup -- Oral - Nectar Straw -- Oral - Thin Teaspoon -- Oral - Thin Cup -- Oral - Thin Straw -- Oral - Puree -- Oral - Mech Soft -- Oral - Regular -- Oral - Multi-Consistency -- Oral - Pill -- Oral Phase - Comment --  CHL IP PHARYNGEAL PHASE 07/28/2016 Pharyngeal Phase Impaired Pharyngeal- Pudding Teaspoon -- Pharyngeal -- Pharyngeal- Pudding Cup -- Pharyngeal -- Pharyngeal- Honey Teaspoon Pharyngeal residue - valleculae;Lateral channel residue;Compensatory strategies attempted (with notebox);Reduced airway/laryngeal closure;Reduced epiglottic inversion;Delayed swallow initiation-vallecula;Pharyngeal residue - pyriform;Reduced tongue base retraction;Reduced anterior laryngeal mobility Pharyngeal -- Pharyngeal- Honey Cup Delayed swallow initiation-vallecula;Reduced airway/laryngeal closure;Reduced epiglottic inversion;Pharyngeal residue - valleculae;Lateral  channel residue;Compensatory strategies attempted (with notebox);Reduced tongue base retraction;Reduced anterior laryngeal mobility Pharyngeal -- Pharyngeal- Nectar Teaspoon -- Pharyngeal -- Pharyngeal- Nectar Cup Compensatory strategies attempted (with notebox);Pharyngeal residue - valleculae;Penetration/Aspiration before swallow;Reduced airway/laryngeal closure;Reduced epiglottic inversion;Delayed swallow initiation-vallecula;Lateral channel residue;Pharyngeal residue - pyriform;Reduced tongue base retraction;Reduced anterior laryngeal mobility Pharyngeal Material enters airway, passes BELOW cords without attempt by patient to eject out (silent aspiration) Pharyngeal- Nectar Straw -- Pharyngeal -- Pharyngeal- Thin Teaspoon -- Pharyngeal -- Pharyngeal- Thin Cup Delayed swallow initiation-vallecula;Reduced epiglottic inversion;Reduced airway/laryngeal  closure;Penetration/Aspiration before swallow;Pharyngeal residue - valleculae;Compensatory strategies attempted (with notebox);Lateral channel residue;Pharyngeal residue - pyriform;Reduced tongue base retraction;Reduced anterior laryngeal mobility Pharyngeal Material enters airway, CONTACTS cords and not ejected out Pharyngeal- Thin Straw -- Pharyngeal -- Pharyngeal- Puree Delayed swallow initiation-vallecula;Penetration/Aspiration before swallow;Reduced epiglottic inversion;Reduced airway/laryngeal closure;Pharyngeal residue - valleculae;Lateral channel residue;Pharyngeal residue - pyriform;Reduced tongue base retraction;Compensatory strategies attempted (with notebox);Reduced anterior laryngeal mobility Pharyngeal Material enters airway, passes BELOW cords without attempt by patient to eject out (silent aspiration) Pharyngeal- Mechanical Soft -- Pharyngeal -- Pharyngeal- Regular -- Pharyngeal -- Pharyngeal- Multi-consistency -- Pharyngeal -- Pharyngeal- Pill -- Pharyngeal -- Pharyngeal Comment --  CHL IP CERVICAL ESOPHAGEAL PHASE 07/28/2016 Cervical Esophageal Phase WFL Pudding Teaspoon -- Pudding Cup -- Honey Teaspoon -- Honey Cup -- Nectar Teaspoon -- Nectar Cup -- Nectar Straw -- Thin Teaspoon -- Thin Cup -- Thin Straw -- Puree -- Mechanical Soft -- Regular -- Multi-consistency -- Pill -- Cervical Esophageal Comment -- No flowsheet data found. Germain Osgood 07/28/2016, 3:10 PM  Note populated for Jiles Prows, student SLP Germain Osgood, M.A. CCC-SLP [phone]              Medications:   . amLODipine  10 mg Oral q morning - 10a  . aspirin EC  81 mg Oral q morning - 10a  . cholecalciferol  1,000 Units Oral Daily  . clindamycin (CLEOCIN) IV  600 mg Intravenous Q8H  . docusate sodium  100 mg Oral Daily  . gabapentin  300 mg Oral QHS  . galantamine  16 mg Oral QHS  . ipratropium-albuterol  3 mL Nebulization QID  . levothyroxine  150 mcg Oral BH-q7a  . pantoprazole  40 mg Oral BID  .  pravastatin  20 mg Oral QHS  . QUEtiapine  150 mg Oral QHS  . sertraline  150 mg Oral QHS  . sodium chloride flush  3 mL Intravenous Q12H  . tamsulosin  0.4 mg Oral QPC breakfast   Continuous Infusions:  Medical decision making is of high complexity and this patient is at high risk of deterioration, therefore this is a level 3 visit.     LOS: 3 days   [facility] Pager [phone]. If unable to reach me by pager, please call my cell phone at [phone].  *Please refer to [email or web address], password [email or web address] to get updated schedule on who will round on this patient, as hospitalists switch teams weekly. If 7PM-7AM, please contact night-coverage at [email or web address], password [email or web address] for any overnight needs.  07/29/2016, 8:47 AM

## 2016-07-29 NOTE — Progress Notes (Signed)
Speech Language Pathology Treatment: Dysphagia  Patient Details Name: Wesley [name] MRN: [id number] DOB: [birth year] Today's Date: 07/29/2016 Time: [id number] SLP Time Calculation (min) (ACUTE ONLY): 9 min  Assessment / Plan / Recommendation Clinical Impression  SLP provided education about results/recommendations from Wesley [name] on previous date. Pt consumed alternating bites of pureed solids and honey thick liquids by spoon with Mod cues for use of swallowing strategies to increase safety. Immediate throat clearing noted x1, with SLP then cueing pt for more effortful cough. His vocal quality remained clear throughout trials. Recommend to continue with current diet and precautions for now. Pt will benefit from repeat testing prior to d/c to determine if any diet advancements can be made.    HPI HPI: Wesley Burke Cannonis a 70 y.o.malewith medical history significant for 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 Of note patient was recently treated beginning on November 3 with 10 days of Cipro and Flagyl for acute diverticulitis and a currently is asymptomatic. Previous MBS (09/03/15) showed silent aspiration of thin liquids and Dys 3 diet with nectar thick liquids was recommended.      SLP Plan  Continue with current plan of care     Recommendations  Diet recommendations: Dysphagia 1 (puree);Honey-thick liquid Liquids provided via: Teaspoon Medication Administration: Crushed with puree Supervision: Patient able to self feed;Full supervision/cueing for  compensatory strategies Compensations: Minimize environmental distractions;Slow rate;Small sips/bites;Chin tuck;Clear throat intermittently;Follow solids with liquid;Multiple dry swallows after each bite/sip Postural Changes and/or Swallow Maneuvers: Seated upright 90 degrees;Upright 30-60 min after meal;Chin tuck                Oral Care Recommendations: Oral care BID Follow up Recommendations: Home health SLP Plan: Continue with current plan of care       GO                Wesley [name] 07/29/2016, 12:07 PM  Wesley [name], M.A. CCC-SLP [phone]

## 2016-07-29 NOTE — Progress Notes (Signed)
    Subjective:  No chest pain or dyspnea.  Objective:  Vital Signs in the last 24 hours: Temp:  [98.3 F (36.8 C)-99.7 F (37.6 C)] 98.3 F (36.8 C) (11/17 1100) Pulse Rate:  [70-99] 70 (11/17 1100) Resp:  [12-24] 14 (11/17 1100) BP: (94-145)/(53-112) 94/57 (11/17 1100) SpO2:  [92 %-100 %] 97 % (11/17 1100) Weight:  [271 lb 2.7 oz (123 kg)] 271 lb 2.7 oz (123 kg) (11/17 0600)  Intake/Output from previous day: 11/16 0701 - 11/17 0700 In: 1950 [P.O.:1800; IV Piggyback:150] Out: 2025 [Urine:2025]  Physical Exam: Pt is awake and alert, but intermittently falls asleep during my evaluation, arousable HEENT: normal Neck: JVP - elevated Lungs: scattered expiratory wheezes and rhonchi bilaterally CV: RRR with 3/6 late-peaking systolic murmur absent A2 Abd: soft, NT, Positive BS, no hepatomegaly Ext: trace edema bilaterally, distal pulses intact and equal Skin: warm/dry no rash   Lab Results:  Recent Labs  07/28/16 0447 07/29/16 0410  WBC 8.2 8.4  HGB 8.4* 8.6*  PLT 228 263    Recent Labs  07/28/16 0447 07/29/16 0410  NA 142 139  K 4.0 4.6  CL 100* 94*  CO2 35* 34*  GLUCOSE 110* 105*  BUN 55* 61*  CREATININE 3.80* 4.39*   No results for input(s): TROPONINI in the last 72 hours.  Invalid input(s): CK, MB  Tele: Sinus rhythm  Assessment/Plan:  1. Acute on chronic diastolic CHF - Stage C, NYHA III. 2. Severe aortic stenosis 3. Acute on chronic hypercarbic respiratory failure 4. CKD 4 - stable 5. Chronic aspiration pneumonia  Overall appears stable. CXR with diffuse opacities possible edema on background of chronic aspiration. Appreciate CCM care. Given Lasix yesterday and now Creatinine has bumped to 4.39.  Will hold on any further diuretics for now.  He still has Nnegative fluid balance of 5.8L to date since admission. He has chronic hypercarbia, does not tolerate CPAP or positive pressure. Plans for continued TAVR evaluation as outpatient - CTA studies need  to be spread out with his advanced kidney disease.  BP soft and likely having some hypoperfusion of kidneys with worsening renal function.  Will hold amlodipine for now.  Fransico Him, M.D. 07/29/2016, 12:45 PM

## 2016-07-30 ENCOUNTER — Inpatient Hospital Stay (HOSPITAL_COMMUNITY): Payer: Medicare Other

## 2016-07-30 ENCOUNTER — Other Ambulatory Visit: Payer: Self-pay

## 2016-07-30 LAB — POCT I-STAT 3, ART BLOOD GAS (G3+)
ACID-BASE EXCESS: 8 mmol/L — AB (ref 0.0–2.0)
BICARBONATE: 38.5 mmol/L — AB (ref 20.0–28.0)
O2 SAT: 77 %
PH ART: 7.229 — AB (ref 7.350–7.450)
TCO2: 41 mmol/L (ref 0–100)
pCO2 arterial: 92.1 mmHg (ref 32.0–48.0)
pO2, Arterial: 52 mmHg — ABNORMAL LOW (ref 83.0–108.0)

## 2016-07-30 LAB — RENAL FUNCTION PANEL
Albumin: 3.3 g/dL — ABNORMAL LOW (ref 3.5–5.0)
Anion gap: 13 (ref 5–15)
BUN: 60 mg/dL — ABNORMAL HIGH (ref 6–20)
CHLORIDE: 94 mmol/L — AB (ref 101–111)
CO2: 30 mmol/L (ref 22–32)
CREATININE: 4.17 mg/dL — AB (ref 0.61–1.24)
Calcium: 8.2 mg/dL — ABNORMAL LOW (ref 8.9–10.3)
GFR calc non Af Amer: 13 mL/min — ABNORMAL LOW (ref >60)
GFR, EST AFRICAN AMERICAN: 15 mL/min — AB (ref >60)
GLUCOSE: 118 mg/dL — AB (ref 65–99)
Phosphorus: 7.2 mg/dL — ABNORMAL HIGH (ref 2.5–4.6)
Potassium: 4.4 mmol/L (ref 3.5–5.1)
Sodium: 137 mmol/L (ref 135–145)

## 2016-07-30 LAB — PROCALCITONIN: PROCALCITONIN: 0.59 ng/mL

## 2016-07-30 MED ORDER — ACETAMINOPHEN 650 MG RE SUPP
975.0000 mg | Freq: Three times a day (TID) | RECTAL | Status: DC | PRN
  Administered 2016-08-03: 975 mg via RECTAL
  Filled 2016-07-30 (×2): qty 1

## 2016-07-30 MED ORDER — NALOXONE HCL 0.4 MG/ML IJ SOLN
INTRAMUSCULAR | Status: AC
  Administered 2016-07-30: 0.4 mg
  Filled 2016-07-30: qty 1

## 2016-07-30 NOTE — Progress Notes (Signed)
Creatinine rose in the face of diuretics. Lasix was held and creatinine improved today. Dr. Rockne Menghini spoke with Dr. Justin Mend - will defer to nephrology to manage diuresis. Plan for outpatient work-up for TAVR - however, will be tenuous given significant renal dysfunction. Nothing further to add at this time. Cardiology will be available as needed over the weekend. Call with questions.  Pixie Casino, MD, [facility] Attending Cardiologist [place]

## 2016-07-30 NOTE — Progress Notes (Signed)
Progress Note    Wesley [name]  [id number] DOB: 1946/03/20  DOA: 08/04/2016 PCP: [name], MD    Brief Narrative:   Chief complaint: Follow-up aspiration pneumonia  Wesley [name] is an 70 y.o. male with a PMH of achalasia status post surgical intervention approximately 30 years ago with resultant residual dysphasia, chronic aspiration, tonsillar cancer, stage IV chronic kidney disease, and severe aortic stenosis who was admitted 08/04/2016 for preoperative cardiac catheterization in anticipation of undergoing TAVR. On 07/27/16, pulmonology was consulted due to worsening respiratory failure in the setting of pulmonary edema and worsening hypercarbia. Patient was under the care of PCCM until 07/29/16.  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 Chest radiography showed edema versus infectious infiltrate and WBC was elevated. Initially placed on Levaquin, currently on clindamycin. Found to be hypoxic hypercarbic on ABG. Evaluated by speech therapist 07/27/16 and found to have moderate aspiration risk with recommendations of nothing by mouth status. Diet subsequently advanced to dysphasia one on 07/28/16 with honey thickened liquids. Continue bronchodilators, flutter valve and incentive spirometry. Blood cultures negative to date. Continue clindamycin.  Active Problems:   Acute encephalopathy Worse today after receiving Ultram for back pain yesterday.  Ultram D/C'd.    Essential hypertension Norvasc on hold secondary to soft blood pressures.    CKD (chronic kidney disease) stage 4, GFR 15-29 ml/min (HCC)/metabolic acidosis/hyperphosphatemia Admission creatinine was 3.7 and he was hydrated in  anticipation of undergoing heart catheterization 07/26/16. Follows with Dr. Justin Mend of nephrology as an outpatient. Hold further Lasix unless absolutely needed. Avoid nephrotoxins. Creatinine beginning to improve after rising slightly post procedure.    Anemia associated with chronic renal failure Treated with Aranesp chronically. Hemoglobin stable in the 8 range.    Nonrheumatic aortic valve stenosis/diffusely calcified but nonobstructive CAD Severe aortic stenosis with a mean transaortic gradient of 67 mmHg and calculated aortic valve area of 0.77 cm. Underwent cardiac catheterization 08/09/2016 in anticipation of TAVR. Nonobstructive CAD noted on cath. Continue aspirin/statin.    Leukocytosis Likely secondary to aspiration pneumonia.    Acute on chronic diastolic congestive heart failure (HCC) IV Lasix initiated 07/27/16 secondary to decompensated CHF and volume overload. Currently on hold secondary to a rising creatinine post procedure. Creatinine improved.    OSA (obstructive sleep apnea) Noted to be intolerant of BiPAP.    Hypothyroidism Continue Synthroid.    Back pain (chronic) Unresolved with Tylenol. Chronic. Orthopedic doctor gives him steroid injections. Dr. Ernestina Patches [facility] orthopedics). Avoid opiates as even Ultram causes toxicity.  Family Communication/Anticipated D/C date and plan/Code Status   DVT prophylaxis: SCDs ordered. Code Status: Partial Code.  Family Communication: Wife updated at bedside. Disposition Plan: Home when creatinine stable.    Medical Consultants:    PCCM  CVTS   Procedures:    Carotid Dopplers 07/28/16: Mild atherosclerotic plaque in both ICAs without significant stenosis.  Anti-Infectives:    Clindamycin 07/26/16--->  Subjective:   The patient is encephalopathic this morning.  He is confused, disoriented and experiencing myoclonic jerking spells. Review of symptoms is negative for dyspnea, chest pain, nausea/vomiting and current  back pain.  Objective:    Vitals:   07/30/16 0400 07/30/16 0500 07/30/16 0600 07/30/16 0700  BP: (!) 126/114 135/85 137/81 115/73  Pulse: 96 95 (!) 104 (!) 104  Resp:  14 10 18  (!) 9  Temp:      TempSrc:      SpO2: 97% 99% 92% 96%  Weight:  125.1 kg (275 lb 12.7 oz)    Height:        Intake/Output Summary (Last 24 hours) at 07/30/16 0747 Last data filed at 07/30/16 0400  Gross per 24 hour  Intake              770 ml  Output             1050 ml  Net             -280 ml   Filed Weights   07/28/16 0500 07/29/16 0600 07/30/16 0500  Weight: 124.8 kg (275 lb 2.2 oz) 123 kg (271 lb 2.7 oz) 125.1 kg (275 lb 12.7 oz)   Tele: NSR  Exam: General exam: Awake with myoclonic jerking, confused.  Respiratory system: Clear to ascultation. NRM on. Respiratory effort slightly increased. Cardiovascular system: S1 & S2 heard, RRR. + JVD,  rubs, gallops or clicks. III/VI SEM loudest at RUSB. Gastrointestinal system: Abdomen is nondistended, soft and nontender. No organomegaly or masses felt. Normal bowel sounds heard. Central nervous system: Alert and oriented. No focal neurological deficits. Extremities: No clubbing,  or cyanosis. Trace edema. SCDs on. Skin: No rashes, lesions or ulcers. Psychiatry: Judgement and insight appear impaired. Mood & affect flat.   Data Reviewed:   I have personally reviewed following labs and imaging studies:  Labs: Basic Metabolic Panel:  Recent Labs Lab 07/26/16 0339 07/27/16 0357 07/28/16 0447 07/29/16 0410 07/30/16 0558  NA 141 139 142 139 137  K 4.7 5.0 4.0 4.6 4.4  CL 103 100* 100* 94* 94*  CO2 31 31 35* 34* 30  GLUCOSE 111* 92 110* 105* 118*  BUN 60* 59* 55* 61* 60*  CREATININE 3.75* 3.61* 3.80* 4.39* 4.17*  CALCIUM 8.6* 8.8* 8.5* 8.4* 8.2*  MG  --   --  2.3 2.3  --   PHOS  --   --  5.0* 6.2* 7.2*   GFR Estimated Creatinine Clearance: 21.6 mL/min (by C-G formula based on SCr of 4.17 mg/dL (H)). Liver Function Tests:  Recent Labs Lab  08/10/2016 1814 07/30/16 0558  AST 14*  --   ALT 9*  --   ALKPHOS 75  --   BILITOT 0.4  --   PROT 6.9  --   ALBUMIN 3.4* 3.3*   Coagulation profile  Recent Labs Lab 07/23/2016 1814  INR 1.02    CBC:  Recent Labs Lab 08/07/2016 1814 08/08/2016 0419 07/26/16 0339 07/27/16 0357 07/28/16 0447 07/29/16 0410  WBC 25.3* 20.5* 15.8* 12.7* 8.2 8.4  NEUTROABS 22.7*  --   --  9.9*  --   --   HGB 10.0* 9.5* 8.1* 8.7* 8.4* 8.6*  HCT 33.4* 32.4* 27.9* 29.9* 28.6* 29.1*  MCV 87.0 87.3 88.0 87.9 87.5 87.7  PLT 295 283 255 253 228 263   Sepsis Labs:  Recent Labs Lab 07/26/16 0339 07/26/16 1057 07/27/16 0357 07/28/16 0447 07/29/16 0410 07/30/16 0558  PROCALCITON  --  1.25  --  0.88  --  0.59  WBC 15.8*  --  12.7* 8.2 8.4  --     Microbiology Recent Results (from the past 240 hour(s))  Culture, blood (Routine X 2) w Reflex to ID Panel     Status: None (Preliminary result)   Collection Time: 07/26/16 10:57 AM  Result Value Ref Range Status   Specimen Description  BLOOD RIGHT ANTECUBITAL  Final   Special Requests IN PEDIATRIC BOTTLE 3CC  Final   Culture NO GROWTH 3 DAYS  Final   Report Status PENDING  Incomplete  Culture, blood (Routine X 2) w Reflex to ID Panel     Status: None (Preliminary result)   Collection Time: 07/26/16 11:01 AM  Result Value Ref Range Status   Specimen Description BLOOD RIGHT HAND  Final   Special Requests IN PEDIATRIC BOTTLE 2CC  Final   Culture NO GROWTH 3 DAYS  Final   Report Status PENDING  Incomplete  MRSA PCR Screening     Status: None   Collection Time: 07/27/16  2:49 PM  Result Value Ref Range Status   MRSA by PCR NEGATIVE NEGATIVE Final    Comment:        The GeneXpert MRSA Assay (FDA approved for NASAL specimens only), is one component of a comprehensive MRSA colonization surveillance program. It is not intended to diagnose MRSA infection nor to guide or monitor treatment for MRSA infections.     Radiology: Dg Swallowing  Func-speech Pathology  Result Date: 07/28/2016 Objective Swallowing Evaluation: Type of Study: MBS-Modified Barium Swallow Study Patient Details Name: NICHLOAS RAMETTA MRN: [id number] Date of Birth: April 23, 1946 Today's Date: 07/28/2016 Time: SLP Start Time (ACUTE ONLY): 1125-SLP Stop Time (ACUTE ONLY): 1145 SLP Time Calculation (min) (ACUTE ONLY): 20 min Past Medical History: Past Medical History: Diagnosis Date . Achalasia  . Anemia associated with chronic renal failure  . Aortic stenosis  . Chronic kidney disease   Stage IV . Chronic kidney disease (CKD), stage IV (severe) ([place])  . Chronic respiratory failure with hypoxia ([place])  . CKD (chronic kidney disease) stage 4, GFR 15-29 ml/min (HCC)  . Complication of anesthesia  . Depression  . Difficult airway for intubation  . Difficult intubation  . Esophageal dysmotility  . Essential hypertension  . HTN (hypertension)  . Hypothyroidism  . Morbid obesity ([place])  . Obesity hypoventilation syndrome (HCC)   Intolerant of NIMV . Oropharyngeal cancer ([place])  . Oropharyngeal cancer (HCC)   squamous cell . PTSD (post-traumatic stress disorder)  . Pulmonary hypertension   secondary . S/P radiation therapy   Left tonsil: 70 Gy/ 35 Fractions  . TBI (traumatic brain injury) [facility])  Past Surgical History: Past Surgical History: Procedure Laterality Date . CARDIAC CATHETERIZATION N/A 08/11/2016  Procedure: Right/Left Heart Cath and Coronary Angiography;  Surgeon: [name], MD;  Location: [facility];  Service: Cardiovascular;  Laterality: N/A; . CHONDROPLASTY  11/08/2002  extensive, of the patellofemoral joint . CHONDROPLASTY  11/08/2002  lesser extent chondroplasty medial femoral condyle . HELLER MYOTOMY Left 1987 . HIP ARTHROPLASTY Left  . KNEE ARTHROSCOPY  11/08/2002  right knee examination . KNEE ARTHROSCOPY W/ PARTIAL MEDIAL MENISCECTOMY  11/08/2002 . LEG REPLANTATION Right  HPI: Wesley Govoni Cannonis a 70 y.o.malewith medical history significant for prior cT3 N2a M0  squamous cell carcinoma of the left tonsilnow in remission, dysphagia with esophageal dysmotility and silent aspiration, chronic hypoxemic respiratory failure secondary to recurrent aspiration pneumonia/OSA,hypertension, BPH, depression, known aortic stenosis with associated chronic diastolic heart failure, chronic kidney disease stage IV who presented to the hospital to undergo elective cardiac catheterization in anticipation of undergoing TAVRprocedure on December 12th. Of note patient was recently treated beginning on November 3 with 10 days of Cipro and Flagyl for acute diverticulitis and a currently is asymptomatic. Previous MBS (09/03/15) showed silent aspiration of thin liquids and Dys 3 diet with nectar thick  liquids was recommended. Subjective: pt alert, cooperative Assessment / Plan / Recommendation CHL IP CLINICAL IMPRESSIONS 07/28/2016 Therapy Diagnosis Severe pharyngeal phase dysphagia Clinical Impression Pt presents with severe pharyngeal dysphagia. A delay in swallow initiation to the valleculae with reduced laryngeal closure and epiglottic inversion was seen for all PO consistencies. Despite the use of a chin tuck, thin and nectar thick liquids caused silent aspiration/penetration. Sensed aspiration occurred with pureed solids likely mixed with residue from honey thick liquids. Chin tuck was implemented for small tsp of puree, initially showing improved airway protection. Pharyngeal residue was diffuse, but alternating between purees and tsp of honey thick liquids appeared to aid in minimizing residue. Intermittent penetration of residuals occurred but could be cleared with cued throat clear. Pt remains at risk for aspiration with all PO consistencies, although this can be mitigated with Dys 1 diet and honey thick liquids by spoon, using a chin tuck, double swallows, and intermittent throat clearing. Pt and SLP discussed dietary modifications at length and pt stated he did not want a feeding tube.  He is agreeable to trial this modified diet, although at this time is it not clear if this is a progression of his baseline dysphagia versus a more acute exacerbation during current hospitalization. SLP provided education about the risks of aspiration, should he decide in the future to not continue with a modified diet. Will continue to follow for diet tolerance and education. Impact on safety and function Severe aspiration risk   CHL IP TREATMENT RECOMMENDATION 07/28/2016 Treatment Recommendations Therapy as outlined in treatment plan below   Prognosis 07/28/2016 Prognosis for Safe Diet Advancement Good Barriers to Reach Goals Severity of deficits Barriers/Prognosis Comment -- CHL IP DIET RECOMMENDATION 07/28/2016 SLP Diet Recommendations Dysphagia 1 (Puree) solids;Honey thick liquids Liquid Administration via Spoon Medication Administration Crushed with puree Compensations Minimize environmental distractions;Slow rate;Small sips/bites;Chin tuck;Clear throat intermittently;Follow solids with liquid;Multiple dry swallows after each bite/sip Postural Changes Remain semi-upright after after feeds/meals (Comment);Seated upright at 90 degrees   CHL IP OTHER RECOMMENDATIONS 07/28/2016 Recommended Consults -- Oral Care Recommendations Oral care BID Other Recommendations Order thickener from pharmacy;Prohibited food (jello, ice cream, thin soups);Remove water pitcher   CHL IP FOLLOW UP RECOMMENDATIONS 07/28/2016 Follow up Recommendations Other (comment)   CHL IP FREQUENCY AND DURATION 07/28/2016 Speech Therapy Frequency (ACUTE ONLY) min 2x/week Treatment Duration 2 weeks      CHL IP ORAL PHASE 07/28/2016 Oral Phase WFL Oral - Pudding Teaspoon -- Oral - Pudding Cup -- Oral - Honey Teaspoon -- Oral - Honey Cup -- Oral - Nectar Teaspoon -- Oral - Nectar Cup -- Oral - Nectar Straw -- Oral - Thin Teaspoon -- Oral - Thin Cup -- Oral - Thin Straw -- Oral - Puree -- Oral - Mech Soft -- Oral - Regular -- Oral - Multi-Consistency --  Oral - Pill -- Oral Phase - Comment --  CHL IP PHARYNGEAL PHASE 07/28/2016 Pharyngeal Phase Impaired Pharyngeal- Pudding Teaspoon -- Pharyngeal -- Pharyngeal- Pudding Cup -- Pharyngeal -- Pharyngeal- Honey Teaspoon Pharyngeal residue - valleculae;Lateral channel residue;Compensatory strategies attempted (with notebox);Reduced airway/laryngeal closure;Reduced epiglottic inversion;Delayed swallow initiation-vallecula;Pharyngeal residue - pyriform;Reduced tongue base retraction;Reduced anterior laryngeal mobility Pharyngeal -- Pharyngeal- Honey Cup Delayed swallow initiation-vallecula;Reduced airway/laryngeal closure;Reduced epiglottic inversion;Pharyngeal residue - valleculae;Lateral channel residue;Compensatory strategies attempted (with notebox);Reduced tongue base retraction;Reduced anterior laryngeal mobility Pharyngeal -- Pharyngeal- Nectar Teaspoon -- Pharyngeal -- Pharyngeal- Nectar Cup Compensatory strategies attempted (with notebox);Pharyngeal residue - valleculae;Penetration/Aspiration before swallow;Reduced airway/laryngeal closure;Reduced epiglottic inversion;Delayed swallow initiation-vallecula;Lateral channel residue;Pharyngeal residue -  pyriform;Reduced tongue base retraction;Reduced anterior laryngeal mobility Pharyngeal Material enters airway, passes BELOW cords without attempt by patient to eject out (silent aspiration) Pharyngeal- Nectar Straw -- Pharyngeal -- Pharyngeal- Thin Teaspoon -- Pharyngeal -- Pharyngeal- Thin Cup Delayed swallow initiation-vallecula;Reduced epiglottic inversion;Reduced airway/laryngeal closure;Penetration/Aspiration before swallow;Pharyngeal residue - valleculae;Compensatory strategies attempted (with notebox);Lateral channel residue;Pharyngeal residue - pyriform;Reduced tongue base retraction;Reduced anterior laryngeal mobility Pharyngeal Material enters airway, CONTACTS cords and not ejected out Pharyngeal- Thin Straw -- Pharyngeal -- Pharyngeal- Puree Delayed swallow  initiation-vallecula;Penetration/Aspiration before swallow;Reduced epiglottic inversion;Reduced airway/laryngeal closure;Pharyngeal residue - valleculae;Lateral channel residue;Pharyngeal residue - pyriform;Reduced tongue base retraction;Compensatory strategies attempted (with notebox);Reduced anterior laryngeal mobility Pharyngeal Material enters airway, passes BELOW cords without attempt by patient to eject out (silent aspiration) Pharyngeal- Mechanical Soft -- Pharyngeal -- Pharyngeal- Regular -- Pharyngeal -- Pharyngeal- Multi-consistency -- Pharyngeal -- Pharyngeal- Pill -- Pharyngeal -- Pharyngeal Comment --  CHL IP CERVICAL ESOPHAGEAL PHASE 07/28/2016 Cervical Esophageal Phase WFL Pudding Teaspoon -- Pudding Cup -- Honey Teaspoon -- Honey Cup -- Nectar Teaspoon -- Nectar Cup -- Nectar Straw -- Thin Teaspoon -- Thin Cup -- Thin Straw -- Puree -- Mechanical Soft -- Regular -- Multi-consistency -- Pill -- Cervical Esophageal Comment -- No flowsheet data found. Germain Osgood 07/28/2016, 3:10 PM  Note populated for Jiles Prows, student SLP Germain Osgood, M.A. CCC-SLP [phone]              Medications:   . aspirin  81 mg Oral Daily  . cholecalciferol  1,000 Units Oral Daily  . clindamycin (CLEOCIN) IV  600 mg Intravenous Q8H  . docusate sodium  100 mg Oral Daily  . gabapentin  300 mg Oral QHS  . galantamine  16 mg Oral QHS  . ipratropium-albuterol  3 mL Nebulization QID  . levothyroxine  150 mcg Oral BH-q7a  . pantoprazole  40 mg Oral BID  . pravastatin  20 mg Oral QHS  . QUEtiapine  150 mg Oral QHS  . senna  1 tablet Oral Daily  . sertraline  150 mg Oral QHS  . sodium chloride flush  3 mL Intravenous Q12H  . tamsulosin  0.4 mg Oral QPC breakfast   Continuous Infusions:  Medical decision making is of high complexity and this patient is at high risk of deterioration, therefore this is a level 3 visit.     LOS: 4 days   [facility] Pager [phone]. If  unable to reach me by pager, please call my cell phone at [phone].  *Please refer to [email or web address], password [email or web address] to get updated schedule on who will round on this patient, as hospitalists switch teams weekly. If 7PM-7AM, please contact night-coverage at [email or web address], password [email or web address] for any overnight needs.  07/30/2016, 7:47 AM

## 2016-07-30 NOTE — Progress Notes (Signed)
Called Dr. Rockne Menghini about confusion and tremors noted. Orders received and carried out. Will continue to monitor.

## 2016-07-31 ENCOUNTER — Inpatient Hospital Stay (HOSPITAL_COMMUNITY): Payer: Medicare Other

## 2016-07-31 DIAGNOSIS — J80 Acute respiratory distress syndrome: Secondary | ICD-10-CM

## 2016-07-31 LAB — BLOOD GAS, ARTERIAL
ACID-BASE EXCESS: 8.9 mmol/L — AB (ref 0.0–2.0)
BICARBONATE: 34.2 mmol/L — AB (ref 20.0–28.0)
Drawn by: 30136
FIO2: 60
O2 SAT: 92.8 %
PEEP/CPAP: 5 cmH2O
PH ART: 7.38 (ref 7.350–7.450)
Patient temperature: 98.6
RATE: 16 resp/min
VT: 580 mL
pCO2 arterial: 59.2 mmHg — ABNORMAL HIGH (ref 32.0–48.0)
pO2, Arterial: 69.5 mmHg — ABNORMAL LOW (ref 83.0–108.0)

## 2016-07-31 LAB — RENAL FUNCTION PANEL
Albumin: 3.1 g/dL — ABNORMAL LOW (ref 3.5–5.0)
Anion gap: 14 (ref 5–15)
BUN: 59 mg/dL — ABNORMAL HIGH (ref 6–20)
CHLORIDE: 97 mmol/L — AB (ref 101–111)
CO2: 27 mmol/L (ref 22–32)
Calcium: 8.1 mg/dL — ABNORMAL LOW (ref 8.9–10.3)
Creatinine, Ser: 4.05 mg/dL — ABNORMAL HIGH (ref 0.61–1.24)
GFR, EST AFRICAN AMERICAN: 16 mL/min — AB (ref >60)
GFR, EST NON AFRICAN AMERICAN: 14 mL/min — AB (ref >60)
Glucose, Bld: 127 mg/dL — ABNORMAL HIGH (ref 65–99)
POTASSIUM: 5.2 mmol/L — AB (ref 3.5–5.1)
Phosphorus: 6.9 mg/dL — ABNORMAL HIGH (ref 2.5–4.6)
Sodium: 138 mmol/L (ref 135–145)

## 2016-07-31 LAB — MAGNESIUM
MAGNESIUM: 2.2 mg/dL (ref 1.7–2.4)
Magnesium: 2.3 mg/dL (ref 1.7–2.4)

## 2016-07-31 LAB — GLUCOSE, CAPILLARY
GLUCOSE-CAPILLARY: 88 mg/dL (ref 65–99)
GLUCOSE-CAPILLARY: 97 mg/dL (ref 65–99)

## 2016-07-31 LAB — CULTURE, BLOOD (ROUTINE X 2)
CULTURE: NO GROWTH
CULTURE: NO GROWTH

## 2016-07-31 LAB — PHOSPHORUS
Phosphorus: 4.8 mg/dL — ABNORMAL HIGH (ref 2.5–4.6)
Phosphorus: 4.9 mg/dL — ABNORMAL HIGH (ref 2.5–4.6)

## 2016-07-31 MED ORDER — PRAVASTATIN SODIUM 20 MG PO TABS
20.0000 mg | ORAL_TABLET | Freq: Every day | ORAL | Status: DC
  Administered 2016-08-01 – 2016-08-04 (×4): 20 mg
  Filled 2016-07-31 (×4): qty 1

## 2016-07-31 MED ORDER — LIDOCAINE VISCOUS 2 % MT SOLN
15.0000 mL | OROMUCOSAL | Status: AC
  Administered 2016-07-31: 15 mL via OROMUCOSAL
  Filled 2016-07-31: qty 15

## 2016-07-31 MED ORDER — GABAPENTIN 300 MG PO CAPS
300.0000 mg | ORAL_CAPSULE | Freq: Every day | ORAL | Status: DC
  Administered 2016-07-31: 300 mg
  Filled 2016-07-31: qty 1

## 2016-07-31 MED ORDER — FENTANYL 2500MCG IN NS 250ML (10MCG/ML) PREMIX INFUSION
25.0000 ug/h | INTRAVENOUS | Status: DC
  Administered 2016-07-31: 50 ug/h via INTRAVENOUS
  Administered 2016-07-31 – 2016-08-01 (×3): 150 ug/h via INTRAVENOUS
  Administered 2016-08-02: 75 ug/h via INTRAVENOUS
  Administered 2016-08-04: 25 ug/h via INTRAVENOUS
  Filled 2016-07-31 (×6): qty 250

## 2016-07-31 MED ORDER — MIDAZOLAM HCL 2 MG/2ML IJ SOLN
INTRAMUSCULAR | Status: AC
  Administered 2016-07-31: 01:00:00
  Filled 2016-07-31: qty 2

## 2016-07-31 MED ORDER — LEVOTHYROXINE SODIUM 75 MCG PO TABS
150.0000 ug | ORAL_TABLET | ORAL | Status: DC
  Administered 2016-08-01 – 2016-08-05 (×5): 150 ug
  Filled 2016-07-31 (×5): qty 2

## 2016-07-31 MED ORDER — SENNA 8.6 MG PO TABS
1.0000 | ORAL_TABLET | Freq: Every day | ORAL | Status: DC
  Administered 2016-08-01 – 2016-08-05 (×5): 8.6 mg
  Filled 2016-07-31 (×5): qty 1

## 2016-07-31 MED ORDER — FENTANYL CITRATE (PF) 100 MCG/2ML IJ SOLN
INTRAMUSCULAR | Status: AC
  Administered 2016-07-31: 02:00:00
  Filled 2016-07-31: qty 2

## 2016-07-31 MED ORDER — PIPERACILLIN-TAZOBACTAM 3.375 G IVPB
3.3750 g | Freq: Three times a day (TID) | INTRAVENOUS | Status: DC
  Administered 2016-07-31 – 2016-08-01 (×3): 3.375 g via INTRAVENOUS
  Filled 2016-07-31 (×4): qty 50

## 2016-07-31 MED ORDER — CHLORHEXIDINE GLUCONATE 0.12% ORAL RINSE (MEDLINE KIT)
15.0000 mL | Freq: Two times a day (BID) | OROMUCOSAL | Status: DC
  Administered 2016-07-31 – 2016-08-05 (×11): 15 mL via OROMUCOSAL

## 2016-07-31 MED ORDER — ASPIRIN 81 MG PO CHEW
81.0000 mg | CHEWABLE_TABLET | Freq: Every day | ORAL | Status: DC
  Administered 2016-08-01 – 2016-08-05 (×5): 81 mg
  Filled 2016-07-31 (×5): qty 1

## 2016-07-31 MED ORDER — PRO-STAT SUGAR FREE PO LIQD
30.0000 mL | Freq: Two times a day (BID) | ORAL | Status: DC
  Administered 2016-07-31 – 2016-08-01 (×3): 30 mL
  Filled 2016-07-31 (×3): qty 30

## 2016-07-31 MED ORDER — QUETIAPINE FUMARATE 25 MG PO TABS
150.0000 mg | ORAL_TABLET | Freq: Every day | ORAL | Status: DC
  Administered 2016-07-31 – 2016-08-02 (×3): 150 mg
  Filled 2016-07-31 (×3): qty 2

## 2016-07-31 MED ORDER — BRIMONIDINE TARTRATE 0.2 % OP SOLN
1.0000 [drp] | Freq: Two times a day (BID) | OPHTHALMIC | Status: DC
  Administered 2016-07-31 – 2016-08-05 (×11): 1 [drp] via OPHTHALMIC
  Filled 2016-07-31: qty 5

## 2016-07-31 MED ORDER — MIDAZOLAM HCL 2 MG/2ML IJ SOLN
INTRAMUSCULAR | Status: AC
  Administered 2016-07-31: 2 mg
  Filled 2016-07-31: qty 2

## 2016-07-31 MED ORDER — BRINZOLAMIDE 1 % OP SUSP
1.0000 [drp] | Freq: Two times a day (BID) | OPHTHALMIC | Status: DC
  Administered 2016-07-31 – 2016-08-05 (×11): 1 [drp] via OPHTHALMIC
  Filled 2016-07-31: qty 10

## 2016-07-31 MED ORDER — FENTANYL CITRATE (PF) 100 MCG/2ML IJ SOLN
INTRAMUSCULAR | Status: AC
  Administered 2016-07-31: 01:00:00
  Filled 2016-07-31: qty 2

## 2016-07-31 MED ORDER — SERTRALINE HCL 50 MG PO TABS
150.0000 mg | ORAL_TABLET | Freq: Every day | ORAL | Status: DC
  Administered 2016-07-31 – 2016-08-02 (×3): 150 mg
  Filled 2016-07-31 (×3): qty 1

## 2016-07-31 MED ORDER — ORAL CARE MOUTH RINSE
15.0000 mL | Freq: Four times a day (QID) | OROMUCOSAL | Status: DC
  Administered 2016-07-31 – 2016-08-05 (×22): 15 mL via OROMUCOSAL

## 2016-07-31 MED ORDER — VITAL HIGH PROTEIN PO LIQD
1000.0000 mL | ORAL | Status: DC
  Administered 2016-07-31: 1000 mL
  Administered 2016-07-31 – 2016-08-01 (×3)

## 2016-07-31 MED ORDER — MIDAZOLAM HCL 2 MG/2ML IJ SOLN
2.0000 mg | INTRAMUSCULAR | Status: DC | PRN
  Administered 2016-07-31 – 2016-08-04 (×12): 2 mg via INTRAVENOUS
  Filled 2016-07-31 (×13): qty 2

## 2016-07-31 NOTE — Progress Notes (Signed)
eLink Physician-Brief Progress Note Patient Name: Wesley [name] DOB: Mar 25, 1946 MRN: [id number]   Date of Service  07/31/2016  HPI/Events of Note  Change in mental status - Twitching of UE's (not new per bedside nurse). DDx: Seizure vs other.   eICU Interventions  Will order: 1. Versed 2 mg IV now.  2. Head CT Scan wo contrast STAT.  3. If Head CT Scan is negative, will need EEG/Neuroconsultation. 4. Blood glucose STAT.     Intervention Category Major Interventions: Change in mental status - evaluation and management  Wesley [name] 07/31/2016, 6:46 PM

## 2016-07-31 NOTE — Procedures (Signed)
Intubation Procedure Note   ~~~ DIFFICULT AIRWAY ~~~ Wesley [name] [id number] July 09, 1946  Procedure: Intubation Indications: Respiratory insufficiency  Procedure Details Consent: Risks of procedure as well as the alternatives and risks of each were explained to the (patient/caregiver).  Consent for procedure obtained. Time Out: Verified patient identification, verified procedure, site/side was marked, verified correct patient position, special equipment/implants available, medications/allergies/relevent history reviewed, required imaging and test results available.  Performed  Maximum sterile technique was used including gloves, hand hygiene and mask.  Patient known difficult intubation from prior outside surgeries.  Premedication with viscous lidocaine orally, 1mg  versed, 35mcg fentanyl Initial look with bronchoscope revealed markedly distorted anatomy with an anterior and rightward displaced glottis. Glottic structures were difficult to identify.  During initial look, patient was noted to desaturate to the 50s, but came back up within a few minutes to the low 90s.  Additional 1mg  versed and 67mcg fentanyl given. A 7.0 ETT was then loaded onto the bronchoscope and intubation was attempted. Before the trachea could be located, the patient began to desaturate and the attempt was aborted. He continued to desaturate despite BVM with nasal and oral airway in place. Nadir sat 33% with nadir HR 70s. With aggressive bagging, his saturations did recover. Family arrived and agreed to proceed with intubation.  Once saturations were in the 90s and HR back at baseline, another attempt was made. The trachea was identified and the ETT was advanced over the bronchoscope into the trachea to a depth of 24cm at the teeth. The carina was visualized distal to the ETT and the bronchoscope was withdrawn.  + color change + tube condensation + chest rise + bilateral breath sounds  CXR showed tube in adequate  position.    Evaluation Hemodynamic Status: BP stable throughout; O2 sats: transiently fell during during procedure Patient's Current Condition: stable Complications: as above Patient did tolerate procedure well. Chest X-ray ordered to verify placement.  CXR: tube position acceptable.  Wesley Ramming, MD Pulmonary and Critical Care 07/31/16 2:08 AM
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
## 2016-07-31 NOTE — Progress Notes (Signed)
Pharmacy Antibiotic Note  Wesley [name] is a 70 y.o. male admitted on 08/02/2016.  He has developed new respiratory failure and ALI while on Clindamycin for aspiration pneumonia.  Pharmacy has been consulted for Zosyn dosing.  He has stage 4 CKD but his current estimated CrCl is adequate for extended infusion Zosyn.  Plan: Zosyn 3.375g IV q8h extended infusion Monitor renal function Follow available micro data  Height: 5\' 10"  (177.8 cm) Weight: 275 lb 9.2 oz (125 kg) IBW/kg (Calculated) : 73  Temp (24hrs), Avg:98.9 F (37.2 C), Min:98.6 F (37 C), Max:99.2 F (37.3 C)   Recent Labs Lab 08/04/2016 0419 07/26/16 0339 07/27/16 0357 07/28/16 0447 07/29/16 0410 07/30/16 0558 07/31/16 0347  WBC 20.5* 15.8* 12.7* 8.2 8.4  --   --   CREATININE 3.70* 3.75* 3.61* 3.80* 4.39* 4.17* 4.05*    Estimated Creatinine Clearance: 22.2 mL/min (by C-G formula based on SCr of 4.05 mg/dL (H)).    No Known Allergies   Thank you for allowing pharmacy to be a part of this patient's care.  Legrand Como, Pharm.D., BCPS, AAHIVP Clinical Pharmacist Phone: [phone] or [phone] 07/31/2016, 2:45 PM

## 2016-07-31 NOTE — Progress Notes (Signed)
eLink Physician-Brief Progress Note Patient Name: Wesley [name] DOB: 08/22/1946 MRN: [id number]   Date of Service  07/31/2016  HPI/Events of Note  Camara check on patient with acute respiratory failure. Nurse at bedside. Increased work of breathing on nonrebreather mask. Saturation 96%. Nurse reports patient has minimal air movement. Currently on Duonebs 4 times a day. Diuresis held today due to acute renal failure. Currently being evaluated for  TAVR.   eICU Interventions  1. Administering albuterol nebulized stat 2. Intensivist to assess patient at bedside for possible intubation      Intervention Category Major Interventions: Respiratory failure - evaluation and management  Tera Partridge 07/31/2016, 12:29 AM

## 2016-07-31 NOTE — Progress Notes (Signed)
eLink Physician-Brief Progress Note Patient Name: KELDAN BOVAIRD DOB: Apr 11, 1946 MRN: [id number]   Date of Service  07/31/2016  HPI/Events of Note  Blood glucose = 97. Head CT Scan wo contrast - IMPRESSION: 1. No acute intracranial pathology seen on CT. 2. Mild cortical volume loss and scattered small vessel ischemic microangiopathy. 3. Chronic lacunar infarcts at the basal ganglia bilaterally.  No further twitching reported.  eICU Interventions  Continue present management.      Intervention Category Major Interventions: Change in mental status - evaluation and management  Sommer,Steven Eugene 07/31/2016, 10:54 PM

## 2016-07-31 NOTE — Progress Notes (Signed)
Sputum sample collected and sent to lab. Pt tolerated well. RT will continue to monitor

## 2016-08-01 ENCOUNTER — Encounter (HOSPITAL_COMMUNITY): Payer: Medicare Other

## 2016-08-01 ENCOUNTER — Inpatient Hospital Stay (HOSPITAL_COMMUNITY): Payer: Medicare Other

## 2016-08-01 ENCOUNTER — Ambulatory Visit: Payer: Medicare Other | Admitting: Physical Therapy

## 2016-08-01 ENCOUNTER — Other Ambulatory Visit: Payer: Self-pay | Admitting: *Deleted

## 2016-08-01 DIAGNOSIS — R000 Tachycardia, unspecified: Secondary | ICD-10-CM

## 2016-08-01 DIAGNOSIS — G253 Myoclonus: Secondary | ICD-10-CM

## 2016-08-01 DIAGNOSIS — R259 Unspecified abnormal involuntary movements: Secondary | ICD-10-CM

## 2016-08-01 DIAGNOSIS — R569 Unspecified convulsions: Secondary | ICD-10-CM

## 2016-08-01 LAB — BASIC METABOLIC PANEL
ANION GAP: 12 (ref 5–15)
BUN: 69 mg/dL — ABNORMAL HIGH (ref 6–20)
CALCIUM: 8.1 mg/dL — AB (ref 8.9–10.3)
CO2: 33 mmol/L — AB (ref 22–32)
CREATININE: 4.64 mg/dL — AB (ref 0.61–1.24)
Chloride: 97 mmol/L — ABNORMAL LOW (ref 101–111)
GFR, EST AFRICAN AMERICAN: 13 mL/min — AB (ref >60)
GFR, EST NON AFRICAN AMERICAN: 12 mL/min — AB (ref >60)
Glucose, Bld: 97 mg/dL (ref 65–99)
Potassium: 4.2 mmol/L (ref 3.5–5.1)
Sodium: 142 mmol/L (ref 135–145)

## 2016-08-01 LAB — GLUCOSE, CAPILLARY
GLUCOSE-CAPILLARY: 95 mg/dL (ref 65–99)
GLUCOSE-CAPILLARY: 115 mg/dL — AB (ref 65–99)
GLUCOSE-CAPILLARY: 133 mg/dL — AB (ref 65–99)
Glucose-Capillary: 104 mg/dL — ABNORMAL HIGH (ref 65–99)
Glucose-Capillary: 109 mg/dL — ABNORMAL HIGH (ref 65–99)
Glucose-Capillary: 92 mg/dL (ref 65–99)

## 2016-08-01 LAB — CBC
HEMATOCRIT: 25.6 % — AB (ref 39.0–52.0)
Hemoglobin: 7.6 g/dL — ABNORMAL LOW (ref 13.0–17.0)
MCH: 25.2 pg — ABNORMAL LOW (ref 26.0–34.0)
MCHC: 29.7 g/dL — AB (ref 30.0–36.0)
MCV: 85 fL (ref 78.0–100.0)
Platelets: 181 10*3/uL (ref 150–400)
RBC: 3.01 MIL/uL — ABNORMAL LOW (ref 4.22–5.81)
RDW: 17.2 % — AB (ref 11.5–15.5)
WBC: 7.1 10*3/uL (ref 4.0–10.5)

## 2016-08-01 LAB — PHOSPHORUS
PHOSPHORUS: 4.8 mg/dL — AB (ref 2.5–4.6)
Phosphorus: 7.1 mg/dL — ABNORMAL HIGH (ref 2.5–4.6)

## 2016-08-01 LAB — MAGNESIUM
MAGNESIUM: 2.3 mg/dL (ref 1.7–2.4)
MAGNESIUM: 2.6 mg/dL — AB (ref 1.7–2.4)

## 2016-08-01 LAB — AMMONIA: Ammonia: 42 umol/L — ABNORMAL HIGH (ref 9–35)

## 2016-08-01 MED ORDER — GABAPENTIN 250 MG/5ML PO SOLN
300.0000 mg | Freq: Every day | ORAL | Status: DC
  Administered 2016-08-01: 300 mg
  Filled 2016-08-01 (×2): qty 6

## 2016-08-01 MED ORDER — PIPERACILLIN-TAZOBACTAM IN DEX 2-0.25 GM/50ML IV SOLN
2.2500 g | Freq: Four times a day (QID) | INTRAVENOUS | Status: DC
Start: 2016-08-01 — End: 2016-08-03
  Administered 2016-08-01 – 2016-08-03 (×7): 2.25 g via INTRAVENOUS
  Filled 2016-08-01 (×9): qty 50

## 2016-08-01 MED ORDER — PRO-STAT SUGAR FREE PO LIQD
30.0000 mL | Freq: Every day | ORAL | Status: DC
  Administered 2016-08-02 – 2016-08-05 (×4): 30 mL
  Filled 2016-08-01 (×5): qty 30

## 2016-08-01 MED ORDER — VITAL HIGH PROTEIN PO LIQD
1000.0000 mL | ORAL | Status: DC
  Administered 2016-08-01: 20:00:00
  Administered 2016-08-01: 1000 mL
  Administered 2016-08-02: 20:00:00
  Administered 2016-08-02 – 2016-08-04 (×3): 1000 mL
  Administered 2016-08-04 (×3)
  Administered 2016-08-04: 1000 mL
  Administered 2016-08-04 (×2)
  Administered 2016-08-05: 1000 mL
  Administered 2016-08-05 (×2)

## 2016-08-01 MED ORDER — METOPROLOL TARTRATE 5 MG/5ML IV SOLN
5.0000 mg | Freq: Three times a day (TID) | INTRAVENOUS | Status: DC
  Administered 2016-08-01 – 2016-08-02 (×2): 5 mg via INTRAVENOUS
  Filled 2016-08-01 (×2): qty 5

## 2016-08-01 NOTE — Procedures (Signed)
ELECTROENCEPHALOGRAM REPORT  Date of Study: 08/01/2016  Patient's Name: Wesley [name] MRN: [id number] Date of Birth: [birth year]  Referring Provider: Etta Quill, PA-C  Clinical History: This is a 70 year old man with bilateral tremors with left upgaze. Now with myoclonus.  Medications: Fentanyl gabapentin (NEURONTIN) 250 MG/5ML solution 300 mg  acetaminophen (TYLENOL) suppository 975 mg  albuterol (PROVENTIL) (2.5 MG/3ML) 0.083% nebulizer solution 3 mL  aspirin chewable tablet 81 mg  galantamine (RAZADYNE ER) 24 hr capsule 16 mg  ipratropium-albuterol (DUONEB) 0.5-2.5 (3) MG/3ML nebulizer solution 3 mL  levothyroxine (SYNTHROID, LEVOTHROID) tablet 150 mcg  piperacillin-tazobactam (ZOSYN) IVPB 2.25 g  polyvinyl alcohol (LIQUIFILM TEARS) 1.4 % ophthalmic solution 1 drop  pravastatin (PRAVACHOL) tablet 20 mg  QUEtiapine (SEROQUEL) tablet 150 mg  senna (SENOKOT) tablet 8.6 mg  sertraline (ZOLOFT) tablet 150 mg   Technical Summary: A multichannel digital EEG recording measured by the international 10-20 system with electrodes applied with paste and impedances below 5000 ohms performed as portable with EKG monitoring in an intubated and sedated patient.  Hyperventilation and photic stimulation were not performed.  The digital EEG was referentially recorded, reformatted, and digitally filtered in a variety of bipolar and referential montages for optimal display.   Description: The patient is intubated and sedated on Fentanyl during the recording. There is no clear posterior dominant rhythm. The background consists of a large amount of diffuse 3-4 Hz slowing. Normal sleep architecture is not seen. Hyperventilation and photic stimulation were not performed. Patient is noted to have upper body twitching with no associated epileptiform correlate.  There were no epileptiform discharges or electrographic seizures seen.    EKG lead showed sinus tachycardia.  Impression: This sedated EEG is  abnormal due to moderate diffuse slowing of the background.   Clinical Correlation of the above findings indicates diffuse cerebral dysfunction that is non-specific in etiology and can be seen with hypoxic/ischemic injury, toxic/metabolic encephalopathies, or medication effect from Fentanyl. Twitching did not show any electrographic correlate. There were no electrographic seizures in this study. Clinical correlation is advised.   Ellouise Newer, M.D.

## 2016-08-01 NOTE — Care Management Important Message (Signed)
Important Message  Patient Details  Name: Wesley [name] MRN: [id number] Date of Birth: 05/11/1946   Medicare Important Message Given:  Yes    Eleonora Peeler Abena 08/01/2016, 10:30 AM

## 2016-08-01 NOTE — Progress Notes (Signed)
SLP Discharge Note  Patient Details Name: Wesley [name] MRN: [id number] DOB: [birth year]   Cancelled treatment:       Reason Eval/Treat Not Completed: Medical issues which prohibited therapy.  Pt intubated.  Our services will sign off.  Please re-order SLP when extubated given chronic hx of dysphagia.  Thank you, Nassim Cosma L. Tivis Ringer, [place] CCC/SLP Pager [phone]    Juan Quam Laurice 08/01/2016, 10:46 AM
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
## 2016-08-01 NOTE — Telephone Encounter (Signed)
Spoke with pts wife and pt is in ICU right now for his heart. She will call back to schedule when he is doing better.
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
## 2016-08-01 NOTE — Telephone Encounter (Signed)
ok 

## 2016-08-01 NOTE — Progress Notes (Signed)
Pharmacy Antibiotic Note  Wesley [name] is a 70 y.o. male admitted on 08/01/2016.  He has developed new respiratory failure and ALI while on Clindamycin for aspiration pneumonia.  Pharmacy has been consulted for Zosyn dosing.  He has stage 4 CKD and his renal function is worsening.  Plan: - Change Zosyn to 2.25gm IV Q6H - Monitor renal fxn, clinical progress, micro data abx LOT  Height: 5\' 10"  (177.8 cm) Weight: 276 lb 14.4 oz (125.6 kg) IBW/kg (Calculated) : 73  Temp (24hrs), Avg:99.2 F (37.3 C), Min:98.6 F (37 C), Max:100.4 F (38 C)   Recent Labs Lab 07/26/16 0339 07/27/16 0357 07/28/16 0447 07/29/16 0410 07/30/16 0558 07/31/16 0347 08/01/16 0449  WBC 15.8* 12.7* 8.2 8.4  --   --  7.1  CREATININE 3.75* 3.61* 3.80* 4.39* 4.17* 4.05* 4.64*    Estimated Creatinine Clearance: 19.4 mL/min (by C-G formula based on SCr of 4.64 mg/dL (H)).    No Known Allergies  Antimicrobials this admission:  Clinda 11/14 >>11/19 Zosyn 11/19 >>   Dose adjustments this admission:  N/A  Microbiology results:  11/14 BCx: ngtd 11/19 Sputum: GPC / GNC on Gram stain 11/15 MRSA PCR: neg   Les Longmore D. Mina Marble, PharmD, BCPS Pager:  [phone] 08/01/2016, 12:49 PM

## 2016-08-01 NOTE — Progress Notes (Signed)
Pt had desat episode down to the 70's on 50% FIO2. RN called RT to bedside. RT suctioned patient for moderate about of white and tan secretions with dark brown plugs.  RT increased FIO2 to 100% short term and was able to wean down to 80% following the suctioning. RT will cont to monitor and wean FIO2.

## 2016-08-01 NOTE — Procedures (Signed)
Pt transported to MRI and back to 2S13 on vent, no complications.

## 2016-08-01 NOTE — Care Management Important Message (Signed)
Important Message  Patient Details  Name: DEMARK BONAVENTURA MRN: [id number] Date of Birth: 1945-12-07   Medicare Important Message Given:  Yes    Lundy Cozart Abena 08/01/2016, 10:30 AM

## 2016-08-01 NOTE — Progress Notes (Addendum)
PT Cancellation Note/ Discharge  Patient Details Name: Wesley [name] MRN: [id number] DOB: 1945/09/13   Cancelled Treatment:    Reason Eval/Treat Not Completed: Medical issues which prohibited therapy (pt with noted respiratory decline and intubated) will sign off and await new order when medically stable   [name] B [name] 08/01/2016, 7:52 AM  Elwyn Reach, [place]

## 2016-08-01 NOTE — Progress Notes (Signed)
Patient Name: Wesley [name] Date of Encounter: 08/01/2016  Primary Cardiologist: Burt Knack  Patient Profile      70 year old gentleman with recently diagnosed severe aortic valve disease/stenosis who was initially admitted for preprocedural cardiac catheterization for possible TAVR. Unfortunately, he required readmission for IV fluid hydration and developed pulmonary edema. After receiving IV Lasix he became hypercapnic.  He eventually failed noninvasive ventilation and required intubation over the weekend. There is concern that some of his acute hypoxia is related to aspiration with chronic lung disease.  According to most recent note from cardiology, nephrology has been managing diuresis, however I do not see any nephrology notes.   Subjective   Now intubated, sedated with chronic persistent tremor and also low-grade fever. Neurology has been consulted. EEG leads in place.  Inpatient Medications    Scheduled Meds: . aspirin  81 mg Per Tube Daily  . brinzolamide  1 drop Both Eyes BID   And  . brimonidine  1 drop Both Eyes BID  . chlorhexidine gluconate (MEDLINE KIT)  15 mL Mouth Rinse BID  . [START ON 08/02/2016] feeding supplement (PRO-STAT SUGAR FREE 64)  30 mL Per Tube Daily  . feeding supplement (VITAL HIGH PROTEIN)  1,000 mL Per Tube Q24H  . gabapentin  300 mg Per Tube QHS  . galantamine  16 mg Oral QHS  . ipratropium-albuterol  3 mL Nebulization QID  . levothyroxine  150 mcg Per Tube BH-q7a  . mouth rinse  15 mL Mouth Rinse QID  . metoprolol  5 mg Intravenous Q8H  . piperacillin-tazobactam (ZOSYN)  IV  2.25 g Intravenous Q6H  . pravastatin  20 mg Per Tube QHS  . QUEtiapine  150 mg Per Tube QHS  . senna  1 tablet Per Tube Daily  . sertraline  150 mg Per Tube QHS  . sodium chloride flush  3 mL Intravenous Q12H   Continuous Infusions: . fentaNYL infusion INTRAVENOUS 150 mcg/hr (08/01/16 1500)   PRN Meds: sodium chloride, acetaminophen, albuterol, midazolam,  ondansetron (ZOFRAN) IV, polyvinyl alcohol, sodium chloride flush   Vital Signs    Vitals:   08/01/16 1200 08/01/16 1345 08/01/16 1400 08/01/16 1500  BP: (!) 154/88 (!) 160/101 137/84 (!) 151/97  Pulse: (!) 110 (!) 118 (!) 117 (!) 112  Resp: '18 19 20 20  '$ Temp:      TempSrc:      SpO2: 95% 95% 93% 92%  Weight:      Height:        Intake/Output Summary (Last 24 hours) at 08/01/16 1602 Last data filed at 08/01/16 1500  Gross per 24 hour  Intake             1968 ml  Output             1450 ml  Net              518 ml   Filed Weights   07/30/16 0500 07/31/16 0500 08/01/16 0500  Weight: 125.1 kg (275 lb 12.7 oz) 125 kg (275 lb 9.2 oz) 125.6 kg (276 lb 14.4 oz)    Physical Exam  BP (!) 151/97   Pulse (!) 112   Temp 99.7 F (37.6 C) (Oral)   Resp 20   Ht '5\' 10"'$  (1.778 m)   Wt 125.6 kg (276 lb 14.4 oz)   SpO2 92%   BMI 39.73 kg/m  GEN: Intubated & sedated, persistent tremor. diaphoretic  HEENT: Grossly normal. - EEG leads in place Cardiac:tachycardic; 4-5/6 SEM @  RUSB - too fast to hear additional soudns Respiratory:  Diffuse coarse rhonchi throughout. GI: Soft, nontender, nondistended, BS + x 4. MS: no deformity or atrophy. Skin: warm & moist - ? diaphoretic Neuro: sedated., but opens eyes to touch.   Labs    CBC  Recent Labs  08/01/16 0449  WBC 7.1  HGB 7.6*  HCT 25.6*  MCV 85.0  PLT 165   Basic Metabolic Panel  Recent Labs  07/31/16 0347  07/31/16 1704 08/01/16 0449  NA 138  --   --  142  K 5.2*  --   --  4.2  CL 97*  --   --  97*  CO2 27  --   --  33*  GLUCOSE 127*  --   --  97  BUN 59*  --   --  69*  CREATININE 4.05*  --   --  4.64*  CALCIUM 8.1*  --   --  8.1*  MG  --   < > 2.3 2.3  PHOS 6.9*  < > 4.9* 4.8*  < > = values in this interval not displayed. Liver Function Tests  Recent Labs  07/30/16 0558 07/31/16 0347  ALBUMIN 3.3* 3.1*   No results for input(s): LIPASE, AMYLASE in the last 72 hours. Cardiac Enzymes No results for  input(s): CKTOTAL, CKMB, CKMBINDEX, TROPONINI in the last 72 hours. BNP Invalid input(s): POCBNP D-Dimer No results for input(s): DDIMER in the last 72 hours. Hemoglobin A1C No results for input(s): HGBA1C in the last 72 hours. Fasting Lipid Panel No results for input(s): CHOL, HDL, LDLCALC, TRIG, CHOLHDL, LDLDIRECT in the last 72 hours. Thyroid Function Tests No results for input(s): TSH, T4TOTAL, T3FREE, THYROIDAB in the last 72 hours.  Invalid input(s): FREET3  Telemetry    Sinus Tachy 100-130s- Personally Reviewed  ECG    NSR with IRBB & 1deg AV B (206).  Lateral TWI - Personally Reviewed, stable   Radiology    Ct Head Wo Contrast  Result Date: 07/31/2016 CLINICAL DATA:  Acute onset of altered mental status. Tremors. Initial encounter. EXAM: CT HEAD WITHOUT CONTRAST TECHNIQUE: Contiguous axial images were obtained from the base of the skull through the vertex without intravenous contrast. COMPARISON:  CT of the head performed 06/29/2010 FINDINGS: Brain: No evidence of acute infarction, hemorrhage, hydrocephalus, extra-axial collection or mass lesion/mass effect. Prominence of ventricles and sulci reflects mild cortical volume loss. Mild cerebellar atrophy is noted. Scattered periventricular white matter change likely reflects small vessel ischemic microangiopathy. Chronic lacunar infarcts are noted at the basal ganglia bilaterally. The brainstem and fourth ventricle are within normal limits. The cerebral hemispheres demonstrate grossly normal gray-white differentiation. No mass effect or midline shift is seen. Vascular: No hyperdense vessel or unexpected calcification. Skull: There is no evidence of fracture; visualized osseous structures are unremarkable in appearance. Sinuses/Orbits: The orbits are within normal limits. The paranasal sinuses and mastoid air cells are well-aerated. Other: No significant soft tissue abnormalities are seen. IMPRESSION: 1. No acute intracranial pathology  seen on CT. 2. Mild cortical volume loss and scattered small vessel ischemic microangiopathy. 3. Chronic lacunar infarcts at the basal ganglia bilaterally. Electronically Signed   By: Garald Balding M.D.   On: 07/31/2016 20:23   Mr Brain Wo Contrast  Result Date: 08/01/2016 CLINICAL DATA:  70 year old male recent volume overload, and subsequent acute respiratory distress. Altered mental status and tremors. Initial encounter. EXAM: MRI HEAD WITHOUT CONTRAST TECHNIQUE: Multiplanar, multiecho pulse sequences of the brain and surrounding structures  were obtained without intravenous contrast. COMPARISON:  Head CT without contrast 07/31/2016. Brain MRI 06/29/2010. FINDINGS: Brain: No restricted diffusion to suggest acute infarction. No midline shift, mass effect, evidence of mass lesion, ventriculomegaly, extra-axial collection or acute intracranial hemorrhage. Cervicomedullary junction and pituitary are within normal limits. Scattered and patchy bilateral cerebral white matter and pontine T2 and FLAIR hyperintensity in a nonspecific configuration has mildly progressed since 2011. No cortical encephalomalacia or chronic cerebral blood products identified. Deep gray matter nuclei an the cerebellum appear stable and normal for age. Vascular: Major intracranial vascular flow voids are stable since 2011 and within normal limits. Skull and upper cervical spine: Negative. Normal bone marrow signal. Sinuses/Orbits: Bilateral mastoid effusions are new, and there is fluid layering in the visible pharynx. There is a looped enteric tube in the nasopharynx (series 6, image 1). Paranasal sinuses remain stable in clear. Negative orbit soft tissues. Other: Negative scalp soft tissues. Visible internal auditory structures appear normal. IMPRESSION: 1.  No acute intracranial abnormality. Mild progression of nonspecific cerebral white matter and pontine signal abnormality since 2011, most commonly due to chronic small vessel disease.  2. Oral-enteric tube is looped in the nasopharynx and should be repositioned. 3. Fluid in the pharynx and bilateral mastoid effusions in the setting of intubation. Electronically Signed   By: Odessa Fleming M.D.   On: 08/01/2016 13:32   Dg Chest Port 1 View  Addendum Date: 08/01/2016   ADDENDUM REPORT: 08/01/2016 07:33 ADDENDUM: These results will be called to the ordering clinician or representative by the Radiologist Assistant, and communication documented in the PACS or zVision Dashboard. Electronically Signed   By: Maisie Fus  Register   On: 08/01/2016 07:33   Result Date: 08/01/2016 CLINICAL DATA:  Respiratory failure. EXAM: PORTABLE CHEST 1 VIEW COMPARISON:  07/31/2016 . FINDINGS: Interim placement of NG tube, its tip is at the gastroesophageal junction, advancement of approximately 12 cm suggested . Endotracheal tube in stable position. Cardiomegaly with diffuse bilateral pulmonary infiltrates consistent with pulmonary edema. Bilateral pneumonia cannot be excluded. Bilateral pleural effusions. No pneumothorax . IMPRESSION: 1. Interim placement of NG tube, its tip is at the gastroesophageal junction, interim advancement of approximately 12 cm suggested. Endotracheal tube in stable position. 2. Persistent prominent bilateral pulmonary infiltrates and/or edema. Persistent small bilateral pleural effusions. 3. Persistent cardiomegaly. Electronically Signed: By: Maisie Fus  Register On: 08/01/2016 07:29   Dg Chest Port 1 View  Result Date: 07/31/2016 CLINICAL DATA:  Respiratory failure EXAM: PORTABLE CHEST 1 VIEW COMPARISON:  07/30/2016 FINDINGS: Endotracheal tube is 2.3 cm above the carina. Patchy airspace opacities are present in the central and basilar regions bilaterally, probably worsening. IMPRESSION: Satisfactorily positioned ETT. Worsening airspace opacities bilaterally. Electronically Signed   By: Ellery Plunk M.D.   On: 07/31/2016 02:07   Dg Chest Port 1 View  Result Date: 07/30/2016 CLINICAL  DATA:  Acute onset of shortness of breath. Initial encounter. EXAM: PORTABLE CHEST 1 VIEW COMPARISON:  Chest radiograph performed 07/28/2016 FINDINGS: Patchy bilateral airspace opacities may reflect pulmonary edema or pneumonia, perhaps slightly worsened from the prior study. A small left pleural effusion is noted. No pneumothorax is seen. The cardiomediastinal silhouette is borderline enlarged. No acute osseous abnormalities are identified. IMPRESSION: Patchy bilateral airspace opacities may reflect pulmonary edema or pneumonia, perhaps slightly worsened from the prior study. Small left pleural effusion noted. Borderline cardiomegaly. Electronically Signed   By: Roanna Raider M.D.   On: 07/30/2016 19:49    Cardiac Studies    OP Echo 07/05/16: Progression  to Severe AS (Mean Gradient 49 mmHg, AVA ~0.72-0.83 cm2).  EF 65-60^ with Severe Concentric LVH.   R&LHC 07/19/2016:  Conclusion   1. Severe aortic stenosis with a mean transaortic gradient of 67 mmHg and calculated aortic valve area of 0.77 cm 2. Diffusely calcified but nonobstructive coronary artery disease 3. Moderate pulmonary hypertension  The patient will continue evaluation for TAVR for treatment of his severe aortic stenosis. He will be kept overnight for fluid hydration in the setting of stage IV chronic kidney disease. Will repeat a CBC and metabolic panel in the morning and check a chest x-ray and urinalysis to evaluate his leukocytosis.  Hemo Data   Flowsheet Row Most Recent Value  Fick Cardiac Output 7.42 L/min  Fick Cardiac Output Index 3.12 (L/min)/BSA  Aortic Mean Gradient 66.5 mmHg  Aortic Peak Gradient 86 mmHg  Aortic Valve Area 0.77  Aortic Value Area Index 0.32 cm2/BSA  RA A Wave 14 mmHg  RA V Wave 17 mmHg  RA Mean 14 mmHg  RV Systolic Pressure 60 mmHg  RV Diastolic Pressure 9 mmHg  RV EDP 15 mmHg  PA Systolic Pressure 60 mmHg  PA Diastolic Pressure 35 mmHg  PA Mean 45 mmHg  PW A Wave 23 mmHg  PW V Wave 21  mmHg  PW Mean 21 mmHg  AO Systolic Pressure 219 mmHg  AO Diastolic Pressure 69 mmHg  AO Mean 83 mmHg  LV Systolic Pressure 758 mmHg  LV Diastolic Pressure 7 mmHg  LV EDP 15 mmHg    Hospital Problem List     Principal Problem:   Aspiration pneumonia (HCC) Active Problems:   Essential hypertension   CKD (chronic kidney disease) stage 4, GFR 15-29 ml/min (HCC)   Aortic stenosis   Acute on chronic respiratory failure with hypoxia and hypercapnia (HCC)   Chronic kidney disease (CKD), stage IV (severe) (HCC)   Anemia associated with chronic renal failure   Nonrheumatic aortic valve stenosis   Leukocytosis   Metabolic acidosis   Chronic diastolic congestive heart failure (HCC)   OSA (obstructive sleep apnea)   Acute on chronic respiratory failure with hypoxia (HCC)   Acute pulmonary edema (HCC)   Hypoxia    Assessment & Plan   Unfortunately, chronic renal insufficiency makes diuresis difficult, especially with hypercapnia. My understanding was that nephrology was him involved, but I don't see any notes from them. Would strongly consider their involvement in order to help determine best course of action as far as volume control.  Fortunately he's had prolonged hypoxia and may showing signs of hypoxic encephalopathy.  From a cardiac standpoint, I agree that he may very well benefit from diuresis, but would be helpful to get input from nephrology as his renal function has worsened.  With his aortic stenosis and diastolic dysfunction, he will not tolerate prolonged tachycardia. I've added beta blocker IV to help with rate control and blood pressure control.  For now plans for TAVR hold until he shows signs of improvement from a respiratory and renal standpoint   Signed, Glenetta Hew, MD  08/01/2016, 4:02 PM

## 2016-08-01 NOTE — Progress Notes (Signed)
EEG Completed; Results Pending- LTM started for 1 night.

## 2016-08-01 NOTE — Plan of Care (Signed)
1200 top MRI via bed, monitor and vent  1345 back from MRI same fashion

## 2016-08-02 ENCOUNTER — Telehealth: Payer: Self-pay | Admitting: Pulmonary Disease

## 2016-08-02 ENCOUNTER — Inpatient Hospital Stay (HOSPITAL_COMMUNITY): Payer: Medicare Other

## 2016-08-02 DIAGNOSIS — I350 Nonrheumatic aortic (valve) stenosis: Secondary | ICD-10-CM

## 2016-08-02 LAB — BLOOD GAS, ARTERIAL
Acid-Base Excess: 8.2 mmol/L — ABNORMAL HIGH (ref 0.0–2.0)
Bicarbonate: 34.2 mmol/L — ABNORMAL HIGH (ref 20.0–28.0)
DRAWN BY: 345601
FIO2: 60
MECHVT: 440 mL
O2 SAT: 91.4 %
PATIENT TEMPERATURE: 99.2
PCO2 ART: 70.1 mmHg — AB (ref 32.0–48.0)
PEEP: 5 cmH2O
PH ART: 7.312 — AB (ref 7.350–7.450)
PO2 ART: 69.4 mmHg — AB (ref 83.0–108.0)
RATE: 20 resp/min

## 2016-08-02 LAB — CBC
HCT: 28.1 % — ABNORMAL LOW (ref 39.0–52.0)
Hemoglobin: 8.2 g/dL — ABNORMAL LOW (ref 13.0–17.0)
MCH: 25.5 pg — AB (ref 26.0–34.0)
MCHC: 29.2 g/dL — ABNORMAL LOW (ref 30.0–36.0)
MCV: 87.3 fL (ref 78.0–100.0)
PLATELETS: 182 10*3/uL (ref 150–400)
RBC: 3.22 MIL/uL — AB (ref 4.22–5.81)
RDW: 17.5 % — ABNORMAL HIGH (ref 11.5–15.5)
WBC: 8.5 10*3/uL (ref 4.0–10.5)

## 2016-08-02 LAB — BASIC METABOLIC PANEL
Anion gap: 13 (ref 5–15)
BUN: 86 mg/dL — AB (ref 6–20)
CO2: 33 mmol/L — ABNORMAL HIGH (ref 22–32)
Calcium: 8.4 mg/dL — ABNORMAL LOW (ref 8.9–10.3)
Chloride: 99 mmol/L — ABNORMAL LOW (ref 101–111)
Creatinine, Ser: 5.47 mg/dL — ABNORMAL HIGH (ref 0.61–1.24)
GFR calc Af Amer: 11 mL/min — ABNORMAL LOW (ref >60)
GFR, EST NON AFRICAN AMERICAN: 9 mL/min — AB (ref >60)
GLUCOSE: 112 mg/dL — AB (ref 65–99)
POTASSIUM: 4.4 mmol/L (ref 3.5–5.1)
Sodium: 145 mmol/L (ref 135–145)

## 2016-08-02 LAB — PHOSPHORUS: PHOSPHORUS: 5.4 mg/dL — AB (ref 2.5–4.6)

## 2016-08-02 LAB — GLUCOSE, CAPILLARY
GLUCOSE-CAPILLARY: 144 mg/dL — AB (ref 65–99)
Glucose-Capillary: 106 mg/dL — ABNORMAL HIGH (ref 65–99)
Glucose-Capillary: 108 mg/dL — ABNORMAL HIGH (ref 65–99)
Glucose-Capillary: 111 mg/dL — ABNORMAL HIGH (ref 65–99)
Glucose-Capillary: 144 mg/dL — ABNORMAL HIGH (ref 65–99)

## 2016-08-02 LAB — CULTURE, RESPIRATORY W GRAM STAIN: Culture: NORMAL

## 2016-08-02 LAB — CULTURE, RESPIRATORY

## 2016-08-02 LAB — MAGNESIUM: MAGNESIUM: 2.7 mg/dL — AB (ref 1.7–2.4)

## 2016-08-02 MED ORDER — HEPARIN SOD (PORK) LOCK FLUSH 10 UNIT/ML IV SOLN
10.0000 [IU] | Freq: Once | INTRAVENOUS | Status: DC
  Filled 2016-08-02 (×2): qty 1

## 2016-08-02 MED ORDER — FAMOTIDINE IN NACL 20-0.9 MG/50ML-% IV SOLN
20.0000 mg | Freq: Every day | INTRAVENOUS | Status: DC
  Administered 2016-08-02 – 2016-08-03 (×2): 20 mg via INTRAVENOUS
  Filled 2016-08-02 (×2): qty 50

## 2016-08-02 MED ORDER — FUROSEMIDE 10 MG/ML IJ SOLN
160.0000 mg | Freq: Four times a day (QID) | INTRAVENOUS | Status: DC
  Administered 2016-08-02 – 2016-08-03 (×6): 160 mg via INTRAVENOUS
  Administered 2016-08-04: 21:00:00 via INTRAVENOUS
  Administered 2016-08-04 – 2016-08-05 (×6): 160 mg via INTRAVENOUS
  Filled 2016-08-02 (×15): qty 16

## 2016-08-02 MED ORDER — HEPARIN 1000 UNIT/ML FOR PERITONEAL DIALYSIS: 1000.0000 mL | INTRAMUSCULAR | Status: DC | PRN

## 2016-08-02 MED ORDER — HEPARIN SOD (PORK) LOCK FLUSH 100 UNIT/ML IV SOLN
500.0000 [IU] | Freq: Once | INTRAVENOUS | Status: AC
  Filled 2016-08-02: qty 5

## 2016-08-02 MED ORDER — NOREPINEPHRINE BITARTRATE 1 MG/ML IV SOLN
2.0000 ug/min | INTRAVENOUS | Status: DC
  Administered 2016-08-02: 5 ug/min via INTRAVENOUS
  Administered 2016-08-03: 4 ug/min via INTRAVENOUS
  Administered 2016-08-03: 9 ug/min via INTRAVENOUS
  Administered 2016-08-03: 12 ug/min via INTRAVENOUS
  Administered 2016-08-03: 10 ug/min via INTRAVENOUS
  Administered 2016-08-04: 6 ug/min via INTRAVENOUS
  Administered 2016-08-04: 5 ug/min via INTRAVENOUS
  Administered 2016-08-05: 10 ug/min via INTRAVENOUS
  Filled 2016-08-02 (×7): qty 4

## 2016-08-02 MED ORDER — SODIUM CHLORIDE 0.9 % IV BOLUS (SEPSIS)
250.0000 mL | Freq: Once | INTRAVENOUS | Status: AC
  Administered 2016-08-02: 250 mL via INTRAVENOUS

## 2016-08-02 NOTE — Procedures (Signed)
  Electroencephalogram report- LTM   Data acquisition: 10-20 electrode placement.  Additional T1, T2, and EKG electrodes; 26 channel digital referential acquisition reformatted to 18 channel/7 channel coronal bipolar     Beginning time: 08/01/16 at 3 35 pm Ending time: 08/02/16 at 08 22 am  Day of study: day 1    This 16  hours of intensive EEG monitoring with simultaneous video monitoring was performed for this patient with altered mental status and multiple medical problems. Medications: as per emr  There was no pushbutton activations events during this recording.  Background activities were marked by a continues 3-4 cps delta slowing at times alternating and with admixed faster frequencies ranging between 4-6 cps.  However delta slowing appeared to be a predominant activity throughout the EEG recording.  At times superimposed higher amplitude bursts of generalized  delta slowing were present intermittently and periodically frequently carrying morphology of triphasic waves.  There was no epileptiform discharges clinical or subclinical seizures present.    Several episodes of body tremulousness or twitching were recorded.  This events were not accompanied by EEG changes to suggest ictal phenomenon.  This events are not seizures.  Clinical interpretation: This 16 hours of intensive EEG monitoring with simultaneous monitoring did not record any clinical or subclinical seizures.  Background activities were marked by reactive background activity slowing as discussed above as well as presence of triphasic waves.  This findings suggestive of moderate to severe encephalopathy of nonspecific etiologies including toxic metabolic pharmacological multifocal degenerative etiologies.  Presence of triphasic waves suggestive of metabolic causes.  Intermittent tremulousness or appendicular twitching were not accompanied by EEG changes to suggest seizures.  This events are not seizures.  Clinical correlation is  advised.

## 2016-08-02 NOTE — Progress Notes (Signed)
LTM discontinued Per Dr Lawana Pai. No skin breakdown was seen.

## 2016-08-02 NOTE — Progress Notes (Addendum)
[address]       [place] [address]             [phone]        CARDIOTHORACIC SURGERY PROGRESS NOTE   R8 Days Post-Op Procedure(s) (LRB): Right/Left Heart Cath and Coronary Angiography (N/A)  Subjective: Patient opens eyes and has some purposeful movements but not following commands consistently.  Continuous myoclonic activity.  Wife and daughter at bedside.  Objective: Vital signs: BP Readings from Last 1 Encounters:  08/02/16 (!) 88/52   Pulse Readings from Last 1 Encounters:  08/02/16 84   Resp Readings from Last 1 Encounters:  08/02/16 (!) 28   Temp Readings from Last 1 Encounters:  08/02/16 100.2 F (37.9 C) (Oral)    Hemodynamics:    Physical Exam:  Rhythm:   sinus  Breath sounds: Scattered rhonchi  Heart sounds:  RRR w/ systolic murmur  Incisions:  n/a  Abdomen:  soft  Extremities:  Warm, well-perfused, swollen   Intake/Output from previous day: 11/20 0701 - 11/21 0700 In: 2400 [I.V.:540; NG/GT:1685; IV Piggyback:175] Out: 600 [Urine:600] Intake/Output this shift: Total I/O In: 201.3 [I.V.:16.3; NG/GT:160; IV Piggyback:25] Out: 1350 [id number]  Lab Results:  CBC: Recent Labs  08/01/16 0449 08/02/16 0308  WBC 7.1 8.5  HGB 7.6* 8.2*  HCT 25.6* 28.1*  PLT 181 182    BMET:  Recent Labs  08/01/16 0449 08/02/16 0308  NA 142 145  K 4.2 4.4  CL 97* 99*  CO2 33* 33*  GLUCOSE 97 112*  BUN 69* 86*  CREATININE 4.64* 5.47*  CALCIUM 8.1* 8.4*     PT/INR:  No results for input(s): LABPROT, INR in the last 72 hours.  CBG (last 3)   Recent Labs  08/02/16 0433 08/02/16 0743 08/02/16 1114  GLUCAP 108* 106* 144*    ABG    Component Value Date/Time   PHART 7.312 (L) 08/02/2016 0336   PCO2ART 70.1 (HH) 08/02/2016 0336   PO2ART 69.4 (L) 08/02/2016 0336   HCO3 34.2 (H) 08/02/2016 0336   TCO2 41 07/30/2016 2006   O2SAT 91.4 08/02/2016 0336    CXR: PORTABLE CHEST 1 VIEW  COMPARISON:   08/01/2016  FINDINGS: Endotracheal tube terminates 3.4 cm above the carina. Enteric tube terminates over the distal esophagus, unchanged. The cardiac silhouette remains enlarged. Bilateral perihilar and basilar lung opacities have not significantly changed, including dense retrocardiac consolidation. No large pleural effusion or pneumothorax is identified.  IMPRESSION: 1. Unchanged bilateral airspace disease. 2. Unchanged positioning of enteric tube, terminating over the distal esophagus.   Electronically Signed   By: Logan Bores M.D.   On: 08/02/2016 08:05    Assessment/Plan: S/P Procedure(s) (LRB): Right/Left Heart Cath and Coronary Angiography (N/A)  Patient remains vent-dependent with no signs of improvement since he was re-intubated over the weekend.  Acute on chronic respiratory failure with numerous contributing factors including chronic aspiration and obesity hypoventilation syndrome.  Volume overload with acute on chronic diastolic CHF clearly playing a role with weight 10lbs up over the past week.  Renal function has deteriorated further w/ rising BUN/Creatinine.  Now more encephalopathic, likely due to TME and hypercarbia with no signs of stroke.  No fevers and WBC normal, but cannot r/o infection.  Patient also remains quite anemic, which is not tolerated in patients with severe aortic stenosis due to need for compensatory increase in cardiac output to maintain oxygen demand.  Mr. Luginbill is not a candidate for TAVR at this  time.  If an aggressive approach is desired and planned, balloon aortic valvuloplasty could be considered as a temporizing measure to alleviate AS with hopes of facilitating his recovery from his recent set backs.  I suspect that he will need initiation of hemodialysis if he is to recover, but that carries with it important consequences for both the short and long term.  His overall prognosis and life expectancy is not good.  Discussed at length with  wife and daughter at the bedside.  All questions answered.   I spent in excess of 30 minutes during the conduct of this hospital encounter and >50% of this time involved direct face-to-face encounter with the patient for counseling and/or coordination of their care.   Rexene Alberts, MD 08/02/2016 1:45 PM

## 2016-08-02 NOTE — Progress Notes (Signed)
Patient with decreased urine output.  Incontinent patient with condom cath in place.  Bladder scan indicates >999 mL bladder contents.  Page placed to Noe Gens, NP with CCM for indwelling catheter orders.

## 2016-08-02 NOTE — Progress Notes (Signed)
    Case discussed with PCCM = Now hypotensive & no longer tachycardic -- requiring pressors (recommend Levophed)   -- HD line being placed to allow for CVVH with worsening renal Fxn & need for volume management -- Anemic - consider transfusion with CVVH, not well tolerated with AS.  Seen today by Dr. Roxy Manns (CT Sgx - TAVR).  I agree with his recommendations.   At this stage, not a TAVR candidate, but ? BAV may be an option for temporizing. -- Will need to discuss with Dr. Burt Knack & Dr.McAlhany.  Was getting HD line today.    Will follow-up tomorrow.   Glenetta Hew, MD

## 2016-08-02 NOTE — Procedures (Signed)
  Hemodialysis Catheter Insertion Procedure Note MANRIQUE PICKLER JL:[id number] 1945/10/05  Procedure: Insertion of Hemodialysis Catheter Indications: Hemodialysis  Procedure Details Consent: Risks of procedure as well as the alternatives and risks of each were explained to the (patient/caregiver).  Consent for procedure obtained.  Time Out: Verified patient identification, verified procedure, site/side was marked, verified correct patient position, special equipment/implants available, medications/allergies/relevent history reviewed, required imaging and test results available.  Performed  Maximum sterile technique was used including antiseptics, cap, gloves, gown, hand hygiene, mask and sheet.  Skin prep: Chlorhexidine; local anesthetic administered  A Trialysis HD catheter was placed in the right internal jugular vein using the Seldinger technique.  Evaluation Blood flow good Complications: No apparent complications Patient did tolerate procedure well. Chest X-ray ordered to verify placement.  CXR: pending.   Procedure performed under direct supervision of Dr. Vaughan Browner and with ultrasound guidance for real time vessel cannulation.     Hayden Pedro, AG-ACNP [facility]  Pgr: [phone]  PCCM Pgr: [phone]

## 2016-08-02 NOTE — Telephone Encounter (Signed)
Spoke with pt. Wife, and she just wanted to clarify that a physician from our office has seen her husband while he is still currently admitted. I did see that he has in fact been seen by several of our physicians. She did want Dr. Halford Chessman to give her a call about her husband's condition and her concerns. I did make her aware that he was on vacation this week and she stated that was fine and she could wait till he was available to call her back.     Message forwarded to Dr. Halford Chessman, pt. States she can be primarily reached at [phone]
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
## 2016-08-02 NOTE — Progress Notes (Signed)
Increased PEEP tp 10 due to sats dropping into the 70's. Unable to keep sats up on 100% and 5 of PEEP. Dr Vaughan Browner aware. Also increased RR to 28 per MD due to increased PCO2. Will cont to monitor.

## 2016-08-02 NOTE — Consult Note (Signed)
Pt is 70 y/o male with CKD 4 followed by Dr. Hyman Hopes with a creat of around 3.  He has known AV disease and and on 11/13 had a L and R heart cath complicated by respiratory failure, intubation and rising creatine.  He has been considered for a TAVR but now a possible balloon valvuloplasty.  He is currently receiving 70% FIO2 and 10 of PEEP, had 1400cc of UOP last 24 hrs and has not received a trial of diuretics.  Past Medical History:  Diagnosis Date  . Achalasia   . Anemia associated with chronic renal failure   . Aortic stenosis   . Chronic kidney disease    Stage IV  . Chronic kidney disease (CKD), stage IV (severe) (HCC)   . Chronic respiratory failure with hypoxia (HCC)   . CKD (chronic kidney disease) stage 4, GFR 15-29 ml/min (HCC)   . Complication of anesthesia   . Depression   . Difficult airway for intubation   . Difficult intubation   . Esophageal dysmotility   . Essential hypertension   . HTN (hypertension)   . Hypothyroidism   . Morbid obesity (HCC)   . Obesity hypoventilation syndrome (HCC)    Intolerant of NIMV  . Oropharyngeal cancer (HCC)   . 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 PTSD (post-traumatic stress disorder)   . Pulmonary hypertension    secondary  . S/P radiation therapy    Left tonsil: 70 Gy/ 35 Fractions   . TBI (traumatic brain injury) [facility])    Past Surgical History:  Procedure Laterality Date  . CARDIAC CATHETERIZATION N/A 07/24/2016   Procedure: Right/Left Heart Cath and Coronary Angiography;  Surgeon: Tonny Bollman, MD;  Location: [facility] INVASIVE CV LAB;  Service: Cardiovascular;  Laterality: N/A;  . CHONDROPLASTY  11/08/2002   extensive, of the patellofemoral joint  . CHONDROPLASTY  11/08/2002   lesser extent chondroplasty medial femoral condyle  . HELLER MYOTOMY Left 1987  . HIP ARTHROPLASTY Left   . KNEE ARTHROSCOPY  11/08/2002   right knee examination  . KNEE ARTHROSCOPY W/ PARTIAL MEDIAL MENISCECTOMY  11/08/2002  . LEG REPLANTATION  Right    Social History:  reports that he has never smoked. He has never used smokeless tobacco. He reports that he does not drink alcohol or use drugs. Allergies: No Known Allergies Family History  Problem Relation Age of Onset  . Adopted: Yes  . Aneurysm Mother     deceased from brain aneurysm    Medications:  Scheduled: . aspirin  81 mg Per Tube Daily  . brinzolamide  1 drop Both Eyes BID   And  . brimonidine  1 drop Both Eyes BID  . chlorhexidine gluconate (MEDLINE KIT)  15 mL Mouth Rinse BID  . feeding supplement (PRO-STAT SUGAR FREE 64)  30 mL Per Tube Daily  . feeding supplement (VITAL HIGH PROTEIN)  1,000 mL Per Tube Q24H  . furosemide  160 mg Intravenous Q6H  . galantamine  16 mg Oral QHS  . ipratropium-albuterol  3 mL Nebulization QID  . levothyroxine  150 mcg Per Tube [id number]  . mouth rinse  15 mL Mouth Rinse QID  . metoprolol  5 mg Intravenous Q8H  . piperacillin-tazobactam (ZOSYN)  IV  2.25 g Intravenous Q6H  . pravastatin  20 mg Per Tube QHS  . QUEtiapine  150 mg Per Tube QHS  . senna  1 tablet Per Tube Daily  . sertraline  150 mg Per Tube QHS  .  sodium chloride  250 mL Intravenous Once  . sodium chloride flush  3 mL Intravenous Q12H   ROS: unable to be obtained Blood pressure (!) 96/55, pulse 88, temperature 100.2 F (37.9 C), temperature source Oral, resp. rate (!) 28, height '5\' 10"'$  (1.778 m), weight 126.1 kg (278 lb), SpO2 96 %.  General appearance: unconscious Head: Normocephalic, without obvious abnormality, atraumatic, oral intubation Eyes: he followed me with his eyes Ears: normal TM's and external ear canals both ears Resp: diminished breath sounds bilaterally Chest wall: no tenderness Cardio: regular rate and rhythm, S1, S2 normal, no murmur, click, rub or gallop GI: soft, non-tender; bowel sounds normal; no masses,  no organomegaly Extremities: edema 1+ Skin: Skin color, texture, turgor normal. No rashes or lesions Neurologic: Grossly  normal Results for orders placed or performed during the hospital encounter of 07/29/2016 (from the past 48 hour(s))  Magnesium     Status: None   Collection Time: 07/31/16  5:04 PM  Result Value Ref Range   Magnesium 2.3 1.7 - 2.4 mg/dL  Phosphorus     Status: Abnormal   Collection Time: 07/31/16  5:04 PM  Result Value Ref Range   Phosphorus 4.9 (H) 2.5 - 4.6 mg/dL  Glucose, capillary     Status: None   Collection Time: 07/31/16  6:54 PM  Result Value Ref Range   Glucose-Capillary 97 65 - 99 mg/dL   Comment 1 Capillary Specimen    Comment 2 Notify RN   Glucose, capillary     Status: Abnormal   Collection Time: 08/01/16 12:19 AM  Result Value Ref Range   Glucose-Capillary 109 (H) 65 - 99 mg/dL   Comment 1 Capillary Specimen   Glucose, capillary     Status: None   Collection Time: 08/01/16  3:59 AM  Result Value Ref Range   Glucose-Capillary 92 65 - 99 mg/dL   Comment 1 Capillary Specimen   CBC     Status: Abnormal   Collection Time: 08/01/16  4:49 AM  Result Value Ref Range   WBC 7.1 4.0 - 10.5 K/uL   RBC 3.01 (L) 4.22 - 5.81 MIL/uL   Hemoglobin 7.6 (L) 13.0 - 17.0 g/dL   HCT 25.6 (L) 39.0 - 52.0 %   MCV 85.0 78.0 - 100.0 fL   MCH 25.2 (L) 26.0 - 34.0 pg   MCHC 29.7 (L) 30.0 - 36.0 g/dL   RDW 17.2 (H) 11.5 - 15.5 %   Platelets 181 150 - 400 K/uL  Basic metabolic panel     Status: Abnormal   Collection Time: 08/01/16  4:49 AM  Result Value Ref Range   Sodium 142 135 - 145 mmol/L   Potassium 4.2 3.5 - 5.1 mmol/L    Comment: DELTA CHECK NOTED   Chloride 97 (L) 101 - 111 mmol/L   CO2 33 (H) 22 - 32 mmol/L   Glucose, Bld 97 65 - 99 mg/dL   BUN 69 (H) 6 - 20 mg/dL   Creatinine, Ser 4.64 (H) 0.61 - 1.24 mg/dL   Calcium 8.1 (L) 8.9 - 10.3 mg/dL   GFR calc non Af Amer 12 (L) >60 mL/min   GFR calc Af Amer 13 (L) >60 mL/min    Comment: (NOTE) The eGFR has been calculated using the CKD EPI equation. This calculation has not been validated in all clinical situations. eGFR's  persistently <60 mL/min signify possible Chronic Kidney Disease.    Anion gap 12 5 - 15  Magnesium     Status:  None   Collection Time: 08/01/16  4:49 AM  Result Value Ref Range   Magnesium 2.3 1.7 - 2.4 mg/dL  Phosphorus     Status: Abnormal   Collection Time: 08/01/16  4:49 AM  Result Value Ref Range   Phosphorus 4.8 (H) 2.5 - 4.6 mg/dL  Glucose, capillary     Status: None   Collection Time: 08/01/16  8:28 AM  Result Value Ref Range   Glucose-Capillary 95 65 - 99 mg/dL   Comment 1 Capillary Specimen    Comment 2 Notify RN   Glucose, capillary     Status: Abnormal   Collection Time: 08/01/16 11:30 AM  Result Value Ref Range   Glucose-Capillary 104 (H) 65 - 99 mg/dL   Comment 1 Capillary Specimen    Comment 2 Notify RN   Ammonia     Status: Abnormal   Collection Time: 08/01/16 11:35 AM  Result Value Ref Range   Ammonia 42 (H) 9 - 35 umol/L  Magnesium     Status: Abnormal   Collection Time: 08/01/16  4:18 PM  Result Value Ref Range   Magnesium 2.6 (H) 1.7 - 2.4 mg/dL  Phosphorus     Status: Abnormal   Collection Time: 08/01/16  4:18 PM  Result Value Ref Range   Phosphorus 7.1 (H) 2.5 - 4.6 mg/dL  Glucose, capillary     Status: Abnormal   Collection Time: 08/01/16  4:40 PM  Result Value Ref Range   Glucose-Capillary 115 (H) 65 - 99 mg/dL   Comment 1 Capillary Specimen    Comment 2 Notify RN   Glucose, capillary     Status: Abnormal   Collection Time: 08/01/16  8:58 PM  Result Value Ref Range   Glucose-Capillary 133 (H) 65 - 99 mg/dL   Comment 1 Capillary Specimen    Comment 2 Notify RN    Comment 3 Document in Chart   Glucose, capillary     Status: Abnormal   Collection Time: 08/02/16 12:15 AM  Result Value Ref Range   Glucose-Capillary 111 (H) 65 - 99 mg/dL   Comment 1 Capillary Specimen    Comment 2 Notify RN    Comment 3 Document in Chart   CBC     Status: Abnormal   Collection Time: 08/02/16  3:08 AM  Result Value Ref Range   WBC 8.5 4.0 - 10.5 K/uL   RBC  3.22 (L) 4.22 - 5.81 MIL/uL   Hemoglobin 8.2 (L) 13.0 - 17.0 g/dL   HCT 28.1 (L) 39.0 - 52.0 %   MCV 87.3 78.0 - 100.0 fL   MCH 25.5 (L) 26.0 - 34.0 pg   MCHC 29.2 (L) 30.0 - 36.0 g/dL   RDW 17.5 (H) 11.5 - 15.5 %   Platelets 182 150 - 400 K/uL  Basic metabolic panel     Status: Abnormal   Collection Time: 08/02/16  3:08 AM  Result Value Ref Range   Sodium 145 135 - 145 mmol/L   Potassium 4.4 3.5 - 5.1 mmol/L   Chloride 99 (L) 101 - 111 mmol/L   CO2 33 (H) 22 - 32 mmol/L   Glucose, Bld 112 (H) 65 - 99 mg/dL   BUN 86 (H) 6 - 20 mg/dL   Creatinine, Ser 5.47 (H) 0.61 - 1.24 mg/dL   Calcium 8.4 (L) 8.9 - 10.3 mg/dL   GFR calc non Af Amer 9 (L) >60 mL/min   GFR calc Af Amer 11 (L) >60 mL/min    Comment: (NOTE)  The eGFR has been calculated using the CKD EPI equation. This calculation has not been validated in all clinical situations. eGFR's persistently <60 mL/min signify possible Chronic Kidney Disease.    Anion gap 13 5 - 15  Magnesium     Status: Abnormal   Collection Time: 08/02/16  3:08 AM  Result Value Ref Range   Magnesium 2.7 (H) 1.7 - 2.4 mg/dL  Phosphorus     Status: Abnormal   Collection Time: 08/02/16  3:08 AM  Result Value Ref Range   Phosphorus 5.4 (H) 2.5 - 4.6 mg/dL  Blood gas, arterial     Status: Abnormal   Collection Time: 08/02/16  3:36 AM  Result Value Ref Range   FIO2 60.00     Comment: CORRECTED ON 11/21 AT 2440: PREVIOUSLY REPORTED AS 70.00   Delivery systems VENTILATOR    Mode PRESSURE REGULATED VOLUME CONTROL    VT 440 mL   LHR 20 resp/min   Peep/cpap 5.0 cm H20   pH, Arterial 7.312 (L) 7.350 - 7.450   pCO2 arterial 70.1 (HH) 32.0 - 48.0 mmHg    Comment: CRITICAL RESULT CALLED TO, READ BACK BY AND VERIFIED WITH: ERIC RN AT 400 BY VICTOR TAYLOR RRT ON 08/02/2016    pO2, Arterial 69.4 (L) 83.0 - 108.0 mmHg   Bicarbonate 34.2 (H) 20.0 - 28.0 mmol/L   Acid-Base Excess 8.2 (H) 0.0 - 2.0 mmol/L   O2 Saturation 91.4 %   Patient temperature 99.2     Collection site RIGHT RADIAL    Drawn by [phone]    Sample type ARTERIAL DRAW    Allens test (pass/fail) PASS PASS  Glucose, capillary     Status: Abnormal   Collection Time: 08/02/16  4:33 AM  Result Value Ref Range   Glucose-Capillary 108 (H) 65 - 99 mg/dL   Comment 1 Capillary Specimen    Comment 2 Notify RN    Comment 3 Document in Chart   Glucose, capillary     Status: Abnormal   Collection Time: 08/02/16  7:43 AM  Result Value Ref Range   Glucose-Capillary 106 (H) 65 - 99 mg/dL   Comment 1 Notify RN   Glucose, capillary     Status: Abnormal   Collection Time: 08/02/16 11:14 AM  Result Value Ref Range   Glucose-Capillary 144 (H) 65 - 99 mg/dL   Comment 1 Notify RN    Ct Head Wo Contrast  Result Date: 07/31/2016 CLINICAL DATA:  Acute onset of altered mental status. Tremors. Initial encounter. EXAM: CT HEAD WITHOUT CONTRAST TECHNIQUE: Contiguous axial images were obtained from the base of the skull through the vertex without intravenous contrast. COMPARISON:  CT of the head performed 06/29/2010 FINDINGS: Brain: No evidence of acute infarction, hemorrhage, hydrocephalus, extra-axial collection or mass lesion/mass effect. Prominence of ventricles and sulci reflects mild cortical volume loss. Mild cerebellar atrophy is noted. Scattered periventricular white matter change likely reflects small vessel ischemic microangiopathy. Chronic lacunar infarcts are noted at the basal ganglia bilaterally. The brainstem and fourth ventricle are within normal limits. The cerebral hemispheres demonstrate grossly normal gray-white differentiation. No mass effect or midline shift is seen. Vascular: No hyperdense vessel or unexpected calcification. Skull: There is no evidence of fracture; visualized osseous structures are unremarkable in appearance. Sinuses/Orbits: The orbits are within normal limits. The paranasal sinuses and mastoid air cells are well-aerated. Other: No significant soft tissue abnormalities  are seen. IMPRESSION: 1. No acute intracranial pathology seen on CT. 2. Mild cortical volume loss and  scattered small vessel ischemic microangiopathy. 3. Chronic lacunar infarcts at the basal ganglia bilaterally. Electronically Signed   By: Garald Balding M.D.   On: 07/31/2016 20:23   Mr Brain Wo Contrast  Result Date: 08/01/2016 CLINICAL DATA:  70 year old male recent volume overload, and subsequent acute respiratory distress. Altered mental status and tremors. Initial encounter. EXAM: MRI HEAD WITHOUT CONTRAST TECHNIQUE: Multiplanar, multiecho pulse sequences of the brain and surrounding structures were obtained without intravenous contrast. COMPARISON:  Head CT without contrast 07/31/2016. Brain MRI 06/29/2010. FINDINGS: Brain: No restricted diffusion to suggest acute infarction. No midline shift, mass effect, evidence of mass lesion, ventriculomegaly, extra-axial collection or acute intracranial hemorrhage. Cervicomedullary junction and pituitary are within normal limits. Scattered and patchy bilateral cerebral white matter and pontine T2 and FLAIR hyperintensity in a nonspecific configuration has mildly progressed since 2011. No cortical encephalomalacia or chronic cerebral blood products identified. Deep gray matter nuclei an the cerebellum appear stable and normal for age. Vascular: Major intracranial vascular flow voids are stable since 2011 and within normal limits. Skull and upper cervical spine: Negative. Normal bone marrow signal. Sinuses/Orbits: Bilateral mastoid effusions are new, and there is fluid layering in the visible pharynx. There is a looped enteric tube in the nasopharynx (series 6, image 1). Paranasal sinuses remain stable in clear. Negative orbit soft tissues. Other: Negative scalp soft tissues. Visible internal auditory structures appear normal. IMPRESSION: 1.  No acute intracranial abnormality. Mild progression of nonspecific cerebral white matter and pontine signal abnormality since  2011, most commonly due to chronic small vessel disease. 2. Oral-enteric tube is looped in the nasopharynx and should be repositioned. 3. Fluid in the pharynx and bilateral mastoid effusions in the setting of intubation. Electronically Signed   By: Genevie Ann M.D.   On: 08/01/2016 13:32   Dg Chest Port 1 View  Result Date: 08/02/2016 CLINICAL DATA:  Evaluate central line placement. EXAM: PORTABLE CHEST 1 VIEW COMPARISON:  Chest radiograph August 02, 2016 at 0630 hours FINDINGS: Endotracheal tube tip projects 4.9 cm above the carina. Nasogastric tube looped looped in the distribution of the mid esophagus with distal tip projecting at GE junction. RIGHT internal jugular central venous catheter tip projects in mid superior vena cava. Patient rotated to the LEFT. The cardiac silhouette is moderately enlarged. Calcified aortic knob. Pulmonary vascular congestion and interstitial prominence. Bibasilar strandy densities with small to moderate LEFT pleural effusion. No pneumothorax. Soft tissue planes and included osseous structures are unchanged. IMPRESSION: Endotracheal tube tip projects 4.9 cm above the carina. Nasogastric tube looped looped in the distribution of the mid esophagus with distal tip projecting at GE junction. RIGHT internal jugular central venous catheter tip projects in mid superior vena cava. No pneumothorax. Stable cardiomegaly and findings of CHF with bibasilar probable atelectasis and small to moderate LEFT pleural effusion. Electronically Signed   By: Elon Alas M.D.   On: 08/02/2016 16:12   Dg Chest Port 1 View  Result Date: 08/02/2016 CLINICAL DATA:  Acute respiratory failure.  Intubated. EXAM: PORTABLE CHEST 1 VIEW COMPARISON:  08/01/2016 FINDINGS: Endotracheal tube terminates 3.4 cm above the carina. Enteric tube terminates over the distal esophagus, unchanged. The cardiac silhouette remains enlarged. Bilateral perihilar and basilar lung opacities have not significantly changed,  including dense retrocardiac consolidation. No large pleural effusion or pneumothorax is identified. IMPRESSION: 1. Unchanged bilateral airspace disease. 2. Unchanged positioning of enteric tube, terminating over the distal esophagus. Electronically Signed   By: Logan Bores M.D.   On: 08/02/2016 08:05  Dg Chest Port 1 View  Addendum Date: 08/01/2016   ADDENDUM REPORT: 08/01/2016 07:33 ADDENDUM: These results will be called to the ordering clinician or representative by the Radiologist Assistant, and communication documented in the PACS or zVision Dashboard. Electronically Signed   By: Marcello Moores  Register   On: 08/01/2016 07:33   Result Date: 08/01/2016 CLINICAL DATA:  Respiratory failure. EXAM: PORTABLE CHEST 1 VIEW COMPARISON:  07/31/2016 . FINDINGS: Interim placement of NG tube, its tip is at the gastroesophageal junction, advancement of approximately 12 cm suggested . Endotracheal tube in stable position. Cardiomegaly with diffuse bilateral pulmonary infiltrates consistent with pulmonary edema. Bilateral pneumonia cannot be excluded. Bilateral pleural effusions. No pneumothorax . IMPRESSION: 1. Interim placement of NG tube, its tip is at the gastroesophageal junction, interim advancement of approximately 12 cm suggested. Endotracheal tube in stable position. 2. Persistent prominent bilateral pulmonary infiltrates and/or edema. Persistent small bilateral pleural effusions. 3. Persistent cardiomegaly. Electronically Signed: By: Marcello Moores  Register On: 08/01/2016 07:29    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, nonoliguric 2 VDRF 3 AS 4 Myoclonus  Plan: 1 High dose furosemide trial 2 Dialysis or CRRT PRN, discussed with family 3 Stop gabapentin  Travius Crochet C 08/02/2016, 4:43 PM

## 2016-08-02 NOTE — Care Management Note (Signed)
Case Management Note Original Note created by Michail Jewels 08/09/2016  Patient Details  Name: Wesley [name] MRN: [id number] Date of Birth: 1945-09-24  Subjective/Objective:  Pt presented for follow-up of aortic valve disease. Pt is from home with wife. Per pt he has DME 02 @ 3 L via  [place] and he uses a cane occasionally.                 Action/Plan: CM will continue to monitor for additional needs.   Expected Discharge Date:                  Expected Discharge Plan:  Home/Self Care  In-House Referral:  NA  Discharge planning Services  CM Consult  Post Acute Care Choice:    Choice offered to:     DME Arranged:    DME Agency:     HH Arranged:    HH Agency:     Status of Service:  In process, will continue to follow  If discussed at Long Length of Stay Meetings, dates discussed:    Additional Comments: 08/02/2016  Discussed in LOS 08/02/16:  Pt remains appropriate for continued stay.  Pt rapidly declined over past weekend.   Per Attending; Patient remains vent-dependent with no signs of improvement since he was re-intubated over the weekend.  Volume overload with acute on chronic diastolic CHF clearly playing a role with weight 10lbs up over the past week.  Renal function has deteriorated further w/ rising BUN/Creatinine.  Now more encephalopathic, likely due to TME and hypercarbia with no signs of stroke.  N Patient also remains quite anemic, which is not tolerated in patients with severe aortic stenosis due to need for compensatory increase in cardiac output to maintain oxygen demand. His overall prognosis and life expectancy is not good.  Per bedside nurse attending is tentatively planning goals of care discussion with family .CM will continue to follow for discharge needs   07/29/16 Pt assessed with wife at bedside.  Pt and wife would like [place] for [facility] once order is written - no arrangements have been made  07/27/16 [name], RN, BSN [phone] Pt was  admitted for hydration prior to heart cath in prep for TVAR.  Pt developed pulm edema on the floor- it event advanced to hypercabia and pt was moved to ICU in an attempt to avoid intubation (difficult airway).  CM will continue to follow for discharge needs  [name], RN 08/02/2016, 2:51 PM

## 2016-08-03 ENCOUNTER — Encounter (HOSPITAL_COMMUNITY): Payer: Self-pay | Admitting: Thoracic Surgery (Cardiothoracic Vascular Surgery)

## 2016-08-03 ENCOUNTER — Inpatient Hospital Stay (HOSPITAL_COMMUNITY): Payer: Medicare Other

## 2016-08-03 DIAGNOSIS — D631 Anemia in chronic kidney disease: Secondary | ICD-10-CM

## 2016-08-03 DIAGNOSIS — I5033 Acute on chronic diastolic (congestive) heart failure: Secondary | ICD-10-CM

## 2016-08-03 LAB — BASIC METABOLIC PANEL
Anion gap: 15 (ref 5–15)
BUN: 108 mg/dL — ABNORMAL HIGH (ref 6–20)
CALCIUM: 7.9 mg/dL — AB (ref 8.9–10.3)
CO2: 29 mmol/L (ref 22–32)
CREATININE: 6.31 mg/dL — AB (ref 0.61–1.24)
Chloride: 100 mmol/L — ABNORMAL LOW (ref 101–111)
GFR calc non Af Amer: 8 mL/min — ABNORMAL LOW (ref >60)
GFR, EST AFRICAN AMERICAN: 9 mL/min — AB (ref >60)
GLUCOSE: 144 mg/dL — AB (ref 65–99)
Potassium: 4.1 mmol/L (ref 3.5–5.1)
Sodium: 144 mmol/L (ref 135–145)

## 2016-08-03 LAB — POCT ACTIVATED CLOTTING TIME
ACTIVATED CLOTTING TIME: 158 s
ACTIVATED CLOTTING TIME: 153 s
ACTIVATED CLOTTING TIME: 158 s
ACTIVATED CLOTTING TIME: 158 s
Activated Clotting Time: 153 seconds
Activated Clotting Time: 158 seconds
Activated Clotting Time: 158 seconds
Activated Clotting Time: 153 seconds
Activated Clotting Time: 169 seconds
Activated Clotting Time: 175 seconds
Activated Clotting Time: 169 seconds
Activated Clotting Time: 175 seconds

## 2016-08-03 LAB — RENAL FUNCTION PANEL
ALBUMIN: 2.3 g/dL — AB (ref 3.5–5.0)
ANION GAP: 14 (ref 5–15)
BUN: 91 mg/dL — ABNORMAL HIGH (ref 6–20)
CALCIUM: 8 mg/dL — AB (ref 8.9–10.3)
CO2: 29 mmol/L (ref 22–32)
Chloride: 100 mmol/L — ABNORMAL LOW (ref 101–111)
Creatinine, Ser: 5.17 mg/dL — ABNORMAL HIGH (ref 0.61–1.24)
GFR, EST AFRICAN AMERICAN: 12 mL/min — AB (ref >60)
GFR, EST NON AFRICAN AMERICAN: 10 mL/min — AB (ref >60)
Glucose, Bld: 127 mg/dL — ABNORMAL HIGH (ref 65–99)
POTASSIUM: 4.1 mmol/L (ref 3.5–5.1)
Phosphorus: 4.2 mg/dL (ref 2.5–4.6)
Sodium: 143 mmol/L (ref 135–145)

## 2016-08-03 LAB — CBC
HCT: 26.6 % — ABNORMAL LOW (ref 39.0–52.0)
HEMATOCRIT: 25.3 % — AB (ref 39.0–52.0)
HEMOGLOBIN: 8.1 g/dL — AB (ref 13.0–17.0)
Hemoglobin: 7.4 g/dL — ABNORMAL LOW (ref 13.0–17.0)
MCH: 24.8 pg — ABNORMAL LOW (ref 26.0–34.0)
MCH: 25.9 pg — ABNORMAL LOW (ref 26.0–34.0)
MCHC: 29.2 g/dL — AB (ref 30.0–36.0)
MCHC: 30.5 g/dL (ref 30.0–36.0)
MCV: 84.9 fL (ref 78.0–100.0)
MCV: 85 fL (ref 78.0–100.0)
PLATELETS: 165 10*3/uL (ref 150–400)
Platelets: 169 10*3/uL (ref 150–400)
RBC: 2.98 MIL/uL — ABNORMAL LOW (ref 4.22–5.81)
RBC: 3.13 MIL/uL — AB (ref 4.22–5.81)
RDW: 17.5 % — AB (ref 11.5–15.5)
RDW: 17.6 % — ABNORMAL HIGH (ref 11.5–15.5)
WBC: 16.1 10*3/uL — ABNORMAL HIGH (ref 4.0–10.5)
WBC: 21.2 10*3/uL — ABNORMAL HIGH (ref 4.0–10.5)

## 2016-08-03 LAB — POCT I-STAT 3, ART BLOOD GAS (G3+)
Acid-Base Excess: 7 mmol/L — ABNORMAL HIGH (ref 0.0–2.0)
Bicarbonate: 32.4 mmol/L — ABNORMAL HIGH (ref 20.0–28.0)
O2 SAT: 94 %
PCO2 ART: 55.1 mmHg — AB (ref 32.0–48.0)
PO2 ART: 76 mmHg — AB (ref 83.0–108.0)
Patient temperature: 98.9
TCO2: 34 mmol/L (ref 0–100)
pH, Arterial: 7.378 (ref 7.350–7.450)

## 2016-08-03 LAB — GLUCOSE, CAPILLARY
GLUCOSE-CAPILLARY: 157 mg/dL — AB (ref 65–99)
GLUCOSE-CAPILLARY: 120 mg/dL — AB (ref 65–99)
GLUCOSE-CAPILLARY: 116 mg/dL — AB (ref 65–99)
GLUCOSE-CAPILLARY: 143 mg/dL — AB (ref 65–99)
Glucose-Capillary: 124 mg/dL — ABNORMAL HIGH (ref 65–99)
Glucose-Capillary: 140 mg/dL — ABNORMAL HIGH (ref 65–99)
Glucose-Capillary: 149 mg/dL — ABNORMAL HIGH (ref 65–99)

## 2016-08-03 LAB — PHOSPHORUS: PHOSPHORUS: 4.9 mg/dL — AB (ref 2.5–4.6)

## 2016-08-03 LAB — MAGNESIUM: Magnesium: 2.4 mg/dL (ref 1.7–2.4)

## 2016-08-03 LAB — PREPARE RBC (CROSSMATCH)

## 2016-08-03 MED ORDER — HEPARIN SODIUM (PORCINE) 1000 UNIT/ML DIALYSIS: 1000.0000 [IU] | INTRAMUSCULAR | Status: DC | PRN

## 2016-08-03 MED ORDER — HEPARIN (PORCINE) 2000 UNITS/L FOR CRRT
INTRAVENOUS_CENTRAL | Status: DC | PRN
  Filled 2016-08-03: qty 1000

## 2016-08-03 MED ORDER — HEPARIN BOLUS VIA INFUSION (CRRT)
1000.0000 [IU] | INTRAVENOUS | Status: DC | PRN
  Filled 2016-08-03: qty 1000

## 2016-08-03 MED ORDER — HEPARIN SODIUM (PORCINE) 5000 UNIT/ML IJ SOLN
250.0000 [IU]/h | INTRAMUSCULAR | Status: DC
  Administered 2016-08-03: 250 [IU]/h via INTRAVENOUS_CENTRAL
  Administered 2016-08-03: 1350 [IU]/h via INTRAVENOUS_CENTRAL
  Administered 2016-08-04: 1850 [IU]/h via INTRAVENOUS_CENTRAL
  Administered 2016-08-04: 1550 [IU]/h via INTRAVENOUS_CENTRAL
  Administered 2016-08-04: 1700 [IU]/h via INTRAVENOUS_CENTRAL
  Administered 2016-08-04 – 2016-08-05 (×3): 1850 [IU]/h via INTRAVENOUS_CENTRAL
  Filled 2016-08-03 (×9): qty 2

## 2016-08-03 MED ORDER — PRISMASOL BGK 4/2.5 32-4-2.5 MEQ/L IV SOLN
INTRAVENOUS | Status: DC
  Administered 2016-08-03 – 2016-08-04 (×3): via INTRAVENOUS_CENTRAL
  Filled 2016-08-03 (×5): qty 5000

## 2016-08-03 MED ORDER — SERTRALINE HCL 50 MG PO TABS
150.0000 mg | ORAL_TABLET | Freq: Every day | ORAL | Status: DC
  Administered 2016-08-04 – 2016-08-05 (×2): 150 mg
  Filled 2016-08-03 (×2): qty 1

## 2016-08-03 MED ORDER — HEPARIN SODIUM (PORCINE) 1000 UNIT/ML DIALYSIS
1000.0000 [IU] | INTRAMUSCULAR | Status: DC | PRN
Start: 2016-08-03 — End: 2016-08-05

## 2016-08-03 MED ORDER — PIPERACILLIN-TAZOBACTAM 3.375 G IVPB 30 MIN
3.3750 g | Freq: Four times a day (QID) | INTRAVENOUS | Status: DC
  Administered 2016-08-03 – 2016-08-05 (×9): 3.375 g via INTRAVENOUS
  Filled 2016-08-03 (×12): qty 50

## 2016-08-03 MED ORDER — GALANTAMINE HYDROBROMIDE ER 8 MG PO CP24
16.0000 mg | ORAL_CAPSULE | Freq: Every day | ORAL | Status: DC
  Filled 2016-08-03: qty 2

## 2016-08-03 MED ORDER — PIPERACILLIN-TAZOBACTAM 3.375 G IVPB
3.3750 g | Freq: Four times a day (QID) | INTRAVENOUS | Status: DC
  Filled 2016-08-03 (×2): qty 50

## 2016-08-03 MED ORDER — PRISMASOL BGK 4/2.5 32-4-2.5 MEQ/L IV SOLN
INTRAVENOUS | Status: DC
  Administered 2016-08-03 – 2016-08-05 (×14): via INTRAVENOUS_CENTRAL
  Filled 2016-08-03 (×23): qty 5000

## 2016-08-03 MED ORDER — QUETIAPINE FUMARATE 100 MG PO TABS
100.0000 mg | ORAL_TABLET | Freq: Every day | ORAL | Status: DC
  Administered 2016-08-03 – 2016-08-04 (×2): 100 mg
  Filled 2016-08-03 (×2): qty 1

## 2016-08-03 MED ORDER — SODIUM CHLORIDE 0.9 % IV SOLN
Freq: Once | INTRAVENOUS | Status: AC
  Administered 2016-08-03: 11:00:00 via INTRAVENOUS

## 2016-08-03 NOTE — Progress Notes (Signed)
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, nonoliguric 2 VDRF 3 AS 4 Myoclonus, a little less 5 Anemia, give blood  Plan: Proceed with CRRT, PRBCs   Subjective: Interval History:good uop  Objective: Vital signs in last 24 hours: Temp:  [98.9 F (37.2 C)-101.1 F (38.4 C)] 101.1 F (38.4 C) (11/22 0440) Pulse Rate:  [81-114] 84 (11/22 0730) Resp:  [15-28] 28 (11/22 0730) BP: (66-134)/(37-97) 110/62 (11/22 0730) SpO2:  [86 %-100 %] 94 % (11/22 0730) FiO2 (%):  [60 %-100 %] 80 % (11/22 0700) Weight:  [124.7 kg (274 lb 14.6 oz)] 124.7 kg (274 lb 14.6 oz) (11/22 0451) Weight change: -1.4 kg (-3 lb 1.4 oz)  Intake/Output from previous day: 11/21 0701 - 11/22 0700 In: 3074.5 [I.V.:751.5; NG/GT:1650; IV Piggyback:673] Out: 2970 [Urine:2970] Intake/Output this shift: No intake/output data recorded.  General appearance: alert Abd soft. Opens eyes but not following commands Not very awake  Lab Results:  Recent Labs  08/02/16 0308 08/03/16 0215  WBC 8.5 16.1*  HGB 8.2* 7.4*  HCT 28.1* 25.3*  PLT 182 169   BMET:  Recent Labs  08/02/16 0308 08/03/16 0215  NA 145 144  K 4.4 4.1  CL 99* 100*  CO2 33* 29  GLUCOSE 112* 144*  BUN 86* 108*  CREATININE 5.47* 6.31*  CALCIUM 8.4* 7.9*   No results for input(s): PTH in the last 72 hours. Iron Studies: No results for input(s): IRON, TIBC, TRANSFERRIN, FERRITIN in the last 72 hours. Studies/Results: Mr Brain Wo Contrast  Result Date: 08/01/2016 CLINICAL DATA:  70 year old male recent volume overload, and subsequent acute respiratory distress. Altered mental status and tremors. Initial encounter. EXAM: MRI HEAD WITHOUT CONTRAST TECHNIQUE: Multiplanar, multiecho pulse sequences of the brain and surrounding structures were obtained without intravenous contrast. COMPARISON:  Head CT without contrast 07/31/2016. Brain MRI 06/29/2010. FINDINGS: Brain: No restricted diffusion to suggest acute infarction. No midline shift,  mass effect, evidence of mass lesion, ventriculomegaly, extra-axial collection or acute intracranial hemorrhage. Cervicomedullary junction and pituitary are within normal limits. Scattered and patchy bilateral cerebral white matter and pontine T2 and FLAIR hyperintensity in a nonspecific configuration has mildly progressed since 2011. No cortical encephalomalacia or chronic cerebral blood products identified. Deep gray matter nuclei an the cerebellum appear stable and normal for age. Vascular: Major intracranial vascular flow voids are stable since 2011 and within normal limits. Skull and upper cervical spine: Negative. Normal bone marrow signal. Sinuses/Orbits: Bilateral mastoid effusions are new, and there is fluid layering in the visible pharynx. There is a looped enteric tube in the nasopharynx (series 6, image 1). Paranasal sinuses remain stable in clear. Negative orbit soft tissues. Other: Negative scalp soft tissues. Visible internal auditory structures appear normal. IMPRESSION: 1.  No acute intracranial abnormality. Mild progression of nonspecific cerebral white matter and pontine signal abnormality since 2011, most commonly due to chronic small vessel disease. 2. Oral-enteric tube is looped in the nasopharynx and should be repositioned. 3. Fluid in the pharynx and bilateral mastoid effusions in the setting of intubation. Electronically Signed   By: Genevie Ann M.D.   On: 08/01/2016 13:32   Dg Chest Port 1 View  Result Date: 08/02/2016 CLINICAL DATA:  Evaluate central line placement. EXAM: PORTABLE CHEST 1 VIEW COMPARISON:  Chest radiograph August 02, 2016 at 0630 hours FINDINGS: Endotracheal tube tip projects 4.9 cm above the carina. Nasogastric tube looped looped in the distribution of the mid esophagus with distal tip projecting at GE junction. RIGHT internal jugular central  venous catheter tip projects in mid superior vena cava. Patient rotated to the LEFT. The cardiac silhouette is moderately  enlarged. Calcified aortic knob. Pulmonary vascular congestion and interstitial prominence. Bibasilar strandy densities with small to moderate LEFT pleural effusion. No pneumothorax. Soft tissue planes and included osseous structures are unchanged. IMPRESSION: Endotracheal tube tip projects 4.9 cm above the carina. Nasogastric tube looped looped in the distribution of the mid esophagus with distal tip projecting at GE junction. RIGHT internal jugular central venous catheter tip projects in mid superior vena cava. No pneumothorax. Stable cardiomegaly and findings of CHF with bibasilar probable atelectasis and small to moderate LEFT pleural effusion. Electronically Signed   By: Elon Alas M.D.   On: 08/02/2016 16:12   Dg Chest Port 1 View  Result Date: 08/02/2016 CLINICAL DATA:  Acute respiratory failure.  Intubated. EXAM: PORTABLE CHEST 1 VIEW COMPARISON:  08/01/2016 FINDINGS: Endotracheal tube terminates 3.4 cm above the carina. Enteric tube terminates over the distal esophagus, unchanged. The cardiac silhouette remains enlarged. Bilateral perihilar and basilar lung opacities have not significantly changed, including dense retrocardiac consolidation. No large pleural effusion or pneumothorax is identified. IMPRESSION: 1. Unchanged bilateral airspace disease. 2. Unchanged positioning of enteric tube, terminating over the distal esophagus. Electronically Signed   By: Logan Bores M.D.   On: 08/02/2016 08:05    Scheduled: . aspirin  81 mg Per Tube Daily  . brinzolamide  1 drop Both Eyes BID   And  . brimonidine  1 drop Both Eyes BID  . chlorhexidine gluconate (MEDLINE KIT)  15 mL Mouth Rinse BID  . famotidine (PEPCID) IV  20 mg Intravenous QHS  . feeding supplement (PRO-STAT SUGAR FREE 64)  30 mL Per Tube Daily  . feeding supplement (VITAL HIGH PROTEIN)  1,000 mL Per Tube Q24H  . furosemide  160 mg Intravenous Q6H  . galantamine  16 mg Oral QHS  . ipratropium-albuterol  3 mL Nebulization QID   . levothyroxine  150 mcg Per Tube [id number]  . mouth rinse  15 mL Mouth Rinse QID  . piperacillin-tazobactam (ZOSYN)  IV  2.25 g Intravenous Q6H  . pravastatin  20 mg Per Tube QHS  . QUEtiapine  150 mg Per Tube QHS  . senna  1 tablet Per Tube Daily  . sertraline  150 mg Per Tube QHS  . sodium chloride flush  3 mL Intravenous Q12H       LOS: 8 days   Leoni Goodness C 08/03/2016,8:00 AM

## 2016-08-03 NOTE — Progress Notes (Signed)
Pharmacy Antibiotic Note  Wesley [name] is a 70 y.o. male admitted on 07/18/2016.  He has developed new respiratory failure and ALI while on Clindamycin for aspiration pneumonia.  Pharmacy has been consulted for Zosyn dosing.  Patient has CKD4, now requiring CRRT.  He has intermittent fever and his WBC trended up today.   Plan: - Change Zosyn to 3.375gm IV Q6H - Monitor for CRRT tolerance/interruptions, clinical progress, abx LOT - ?start fluconazole   Height: 5\' 10"  (177.8 cm) Weight: 274 lb 14.6 oz (124.7 kg) IBW/kg (Calculated) : 73  Temp (24hrs), Avg:100 F (37.8 C), Min:98.9 F (37.2 C), Max:101.1 F (38.4 C)   Recent Labs Lab 07/28/16 0447 07/29/16 0410 07/30/16 0558 07/31/16 0347 08/01/16 0449 08/02/16 0308 08/03/16 0215  WBC 8.2 8.4  --   --  7.1 8.5 16.1*  CREATININE 3.80* 4.39* 4.17* 4.05* 4.64* 5.47* 6.31*    Estimated Creatinine Clearance: 14.2 mL/min (by C-G formula based on SCr of 6.31 mg/dL (H)).    No Known Allergies  Antimicrobials this admission:  Clinda 11/14 >>11/19 Zosyn 11/19 >>   Dose adjustments this admission:  N/A  Microbiology results:  11/14 BCx: negative 11/19 Sputum: negative 11/15 MRSA PCR: negative 11/21 TA - yeast  Qusay Villada D. Mina Marble, PharmD, BCPS Pager:  [phone] 08/03/2016, 8:58 AM

## 2016-08-03 NOTE — Progress Notes (Signed)
  Interdisciplinary Goals of Care Family Meeting   Date carried out:: 08/03/2016  Location of the meeting: Bedside  Member's involved: Physician, Bedside Registered Nurse and Family Member or next of kin  Durable Power of Attorney or acting medical decision maker: Wife    Discussion: We discussed goals of care for Wesley [name]  with his wife.  I told her that I discussed clinical situation with Cardiology, CVTS and Nephrology. If we can stabilize the patient we can try to get him to balloon valvuloplasty as a temporizing measure next week. He is not a candidate for TVAR right now. We will start CRRT today with volume removal. Continue levaphed for BP support and broad spectrum abx. Reassess over the weekend and next week. Overall prognosis is poor.  Wife inquired about a tracheostomy. Previously she told me that the patient would not want a trach but now she is reconsidering. I told her that with his chronic issues, if he gets a trach and goes on dialysis then the chance of recovery, to be able to go home are very poor. She reiterated that he would not want to be in a facility. We will continue to reassess daily.  Code status: Full Code  Disposition: Continue current acute care  Time spent for the meeting: Aragon 08/03/2016, 10:28 AM

## 2016-08-03 NOTE — Progress Notes (Signed)
Pt was manually ventilated bagged lavage 10cc NS flush. 100% oxygen therapy administered. Pt was suctioned got back copious amount of thick tan yellow secretions and mucous plugs. Pt was stable throughout suctioning procedure.

## 2016-08-03 NOTE — Progress Notes (Signed)
Subjective: Interval History: less myoclonus.  Objective: Vital signs in last 24 hours: Temp:  [98.9 F (37.2 C)-101.1 F (38.4 C)] 99.7 F (37.6 C) (11/22 0730) Pulse Rate:  [81-114] 85 (11/22 0835) Resp:  [15-28] 28 (11/22 0835) BP: (66-134)/(37-97) 105/59 (11/22 0835) SpO2:  [86 %-100 %] 92 % (11/22 0835) FiO2 (%):  [60 %-100 %] 80 % (11/22 0835) Weight:  [124.7 kg (274 lb 14.6 oz)] 124.7 kg (274 lb 14.6 oz) (11/22 0451)  Intake/Output from previous day: 11/21 0701 - 11/22 0700 In: 3074.5 [I.V.:751.5; NG/GT:1650; IV Piggyback:673] Out: 2970 [Urine:2970] Intake/Output this shift: No intake/output data recorded. Nutritional status:    Neuro Exam MS - drowsy but arouses to voice; follows simple commands CN -  Grossly intact Motor - moves all extrem to command  Lab Results:  Recent Labs  08/02/16 0308 08/03/16 0215  WBC 8.5 16.1*  HGB 8.2* 7.4*  HCT 28.1* 25.3*  PLT 182 169  NA 145 144  K 4.4 4.1  CL 99* 100*  CO2 33* 29  GLUCOSE 112* 144*  BUN 86* 108*  CREATININE 5.47* 6.31*  CALCIUM 8.4* 7.9*   Lipid Panel No results for input(s): CHOL, TRIG, HDL, CHOLHDL, VLDL, LDLCALC in the last 72 hours.  Studies/Results: Mr Brain Wo Contrast  Result Date: 08/01/2016 CLINICAL DATA:  70 year old male recent volume overload, and subsequent acute respiratory distress. Altered mental status and tremors. Initial encounter. EXAM: MRI HEAD WITHOUT CONTRAST TECHNIQUE: Multiplanar, multiecho pulse sequences of the brain and surrounding structures were obtained without intravenous contrast. COMPARISON:  Head CT without contrast 07/31/2016. Brain MRI 06/29/2010. FINDINGS: Brain: No restricted diffusion to suggest acute infarction. No midline shift, mass effect, evidence of mass lesion, ventriculomegaly, extra-axial collection or acute intracranial hemorrhage. Cervicomedullary junction and pituitary are within normal limits. Scattered and patchy bilateral cerebral white matter and  pontine T2 and FLAIR hyperintensity in a nonspecific configuration has mildly progressed since 2011. No cortical encephalomalacia or chronic cerebral blood products identified. Deep gray matter nuclei an the cerebellum appear stable and normal for age. Vascular: Major intracranial vascular flow voids are stable since 2011 and within normal limits. Skull and upper cervical spine: Negative. Normal bone marrow signal. Sinuses/Orbits: Bilateral mastoid effusions are new, and there is fluid layering in the visible pharynx. There is a looped enteric tube in the nasopharynx (series 6, image 1). Paranasal sinuses remain stable in clear. Negative orbit soft tissues. Other: Negative scalp soft tissues. Visible internal auditory structures appear normal. IMPRESSION: 1.  No acute intracranial abnormality. Mild progression of nonspecific cerebral white matter and pontine signal abnormality since 2011, most commonly due to chronic small vessel disease. 2. Oral-enteric tube is looped in the nasopharynx and should be repositioned. 3. Fluid in the pharynx and bilateral mastoid effusions in the setting of intubation. Electronically Signed   By: Genevie Ann M.D.   On: 08/01/2016 13:32   Dg Chest Port 1 View  Result Date: 08/03/2016 CLINICAL DATA:  Hypoxia EXAM: PORTABLE CHEST 1 VIEW COMPARISON:  August 02, 2016 FINDINGS: Endotracheal tube tip is 3.0 cm above the carina. Nasogastric tube is again seen tube looped within the esophagus with the tip at the gastroesophageal junction. Central catheter tip is in the superior vena cava. No pneumothorax. There is cardiomegaly with pulmonary venous hypertension. There is airspace consolidation in both lower lobes as well as in the left upper lobe. There is a moderate effusion on the left. IMPRESSION: Tube and catheter positions as described without pneumothorax. Note that the  nasogastric tube remains looped in the esophagus with the tip near the gastroesophageal junction. Evidence a degree  of congestive heart failure. Suspect areas of superimposed pneumonia, although the areas of airspace opacity could represent alveolar edema. Note that both alveolar edema and pneumonia may exist concurrently. These results will be called to the ordering clinician or representative by the Radiologist Assistant, and communication documented in the PACS or zVision Dashboard. Electronically Signed   By: Lowella Grip III M.D.   On: 08/03/2016 08:08   Dg Chest Port 1 View  Result Date: 08/02/2016 CLINICAL DATA:  Evaluate central line placement. EXAM: PORTABLE CHEST 1 VIEW COMPARISON:  Chest radiograph August 02, 2016 at 0630 hours FINDINGS: Endotracheal tube tip projects 4.9 cm above the carina. Nasogastric tube looped looped in the distribution of the mid esophagus with distal tip projecting at GE junction. RIGHT internal jugular central venous catheter tip projects in mid superior vena cava. Patient rotated to the LEFT. The cardiac silhouette is moderately enlarged. Calcified aortic knob. Pulmonary vascular congestion and interstitial prominence. Bibasilar strandy densities with small to moderate LEFT pleural effusion. No pneumothorax. Soft tissue planes and included osseous structures are unchanged. IMPRESSION: Endotracheal tube tip projects 4.9 cm above the carina. Nasogastric tube looped looped in the distribution of the mid esophagus with distal tip projecting at GE junction. RIGHT internal jugular central venous catheter tip projects in mid superior vena cava. No pneumothorax. Stable cardiomegaly and findings of CHF with bibasilar probable atelectasis and small to moderate LEFT pleural effusion. Electronically Signed   By: Elon Alas M.D.   On: 08/02/2016 16:12   Dg Chest Port 1 View  Result Date: 08/02/2016 CLINICAL DATA:  Acute respiratory failure.  Intubated. EXAM: PORTABLE CHEST 1 VIEW COMPARISON:  08/01/2016 FINDINGS: Endotracheal tube terminates 3.4 cm above the carina. Enteric tube  terminates over the distal esophagus, unchanged. The cardiac silhouette remains enlarged. Bilateral perihilar and basilar lung opacities have not significantly changed, including dense retrocardiac consolidation. No large pleural effusion or pneumothorax is identified. IMPRESSION: 1. Unchanged bilateral airspace disease. 2. Unchanged positioning of enteric tube, terminating over the distal esophagus. Electronically Signed   By: Logan Bores M.D.   On: 08/02/2016 08:05    Medications: I have reviewed the patient's current medications.  Assessment/Plan: 1. Polymyoclonus No evidence for new seizures or stroke.  At this point, would hold off on symptomatic treatment.  Continue to monitor and correct metabolic disturbances.  2. Dementia Will adjust Razadyne to morning dosing  3. PTSD/depression Will decrease Seroquel to 100mg  qhs to improve mental status.  Will also change Zoloft to morning dosing to help with alertness.  4. Multiple medical probs    LOS: 8 days   Doren Custard
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
## 2016-08-03 NOTE — Progress Notes (Signed)
Patient Name: Wesley [name] Date of Encounter: 08/03/2016  Primary Cardiologist: Burt Knack;  CT Sgx: Dr. Roxy Manns   Patient Profile   70 year old gentleman with recently diagnosed severe aortic valve disease/stenosis who was initially admitted for preprocedural cardiac catheterization for possible TAVR. Unfortunately, he required readmission for IV fluid hydration and developed pulmonary edema. After receiving IV Lasix he became hypercapnic.  He eventually failed noninvasive ventilation and required intubation over the weekend. There is concern that some of his acute hypoxia is related to aspiration with chronic lung disease.  Hospital Problem List     Principal Problem:   Aspiration pneumonia ([place]) Active Problems:   Essential hypertension   CKD (chronic kidney disease) stage 4, GFR 15-29 ml/min (HCC)   Aortic stenosis   Acute on chronic respiratory failure with hypoxia and hypercapnia (HCC)   Chronic kidney disease (CKD), stage IV (severe) (HCC)   Anemia associated with chronic renal failure   Nonrheumatic aortic valve stenosis   Leukocytosis   Metabolic acidosis   Chronic diastolic congestive heart failure (HCC)   OSA (obstructive sleep apnea)   Acute on chronic respiratory failure with hypoxia (HCC)   Acute pulmonary edema (HCC)   Hypoxia   Abnormal movements   Acute on chronic diastolic heart failure (HCC)     Subjective   Overall appears to be better today. On lower pressors. Does seem to be opening his eyes and appears to acknowledge conversation.  Currently on CVVHD with good urine output.  Inpatient Medications    Scheduled Meds: . aspirin  81 mg Per Tube Daily  . brinzolamide  1 drop Both Eyes BID   And  . brimonidine  1 drop Both Eyes BID  . chlorhexidine gluconate (MEDLINE KIT)  15 mL Mouth Rinse BID  . famotidine (PEPCID) IV  20 mg Intravenous QHS  . feeding supplement (PRO-STAT SUGAR FREE 64)  30 mL Per Tube Daily  . feeding supplement (VITAL HIGH  PROTEIN)  1,000 mL Per Tube Q24H  . furosemide  160 mg Intravenous Q6H  . [START ON 08/04/2016] galantamine  16 mg Oral Q breakfast  . ipratropium-albuterol  3 mL Nebulization QID  . levothyroxine  150 mcg Per Tube BH-q7a  . mouth rinse  15 mL Mouth Rinse QID  . piperacillin-tazobactam  3.375 g Intravenous Q6H  . pravastatin  20 mg Per Tube QHS  . QUEtiapine  100 mg Per Tube QHS  . senna  1 tablet Per Tube Daily  . [START ON 08/04/2016] sertraline  150 mg Per Tube Daily  . sodium chloride flush  3 mL Intravenous Q12H   Continuous Infusions: . fentaNYL infusion INTRAVENOUS 100 mcg/hr (08/03/16 2300)  . heparin 10,000 units/ 20 mL infusion syringe 1,350 Units/hr (08/03/16 2301)  . norepinephrine (LEVOPHED) Adult infusion 2 mcg/min (08/03/16 2245)  . dialysis replacement fluid (prismasate) 300 mL/hr at 08/03/16 1135  . dialysis replacement fluid (prismasate) 300 mL/hr at 08/03/16 1135  . dialysate (PRISMASATE) 1,500 mL/hr at 08/03/16 2145   PRN Meds: sodium chloride, acetaminophen, albuterol, heparin, heparin, heparin, heparin, heparin, midazolam, ondansetron (ZOFRAN) IV, polyvinyl alcohol, sodium chloride flush   Vital Signs    Vitals:   08/03/16 2215 08/03/16 2230 08/03/16 2245 08/03/16 2300  BP: (!) 97/59 (!) 89/64 (!) 94/56 115/72  Pulse: 99 93 94 93  Resp: (!) 21 (!) 30 (!) 29 (!) 26  Temp:      TempSrc:      SpO2: 95% 96% 97% 99%  Weight:  Height:        Intake/Output Summary (Last 24 hours) at 08/03/16 2319 Last data filed at 08/03/16 2300  Gross per 24 hour  Intake          3313.23 ml  Output             4254 ml  Net          -940.77 ml   Filed Weights   08/01/16 0500 08/02/16 0500 08/03/16 0451  Weight: 125.6 kg (276 lb 14.4 oz) 126.1 kg (278 lb) 124.7 kg (274 lb 14.6 oz)    Physical Exam    GEN: Intubated but not overly sedated. Somewhat responsive to voice and touch. Not as warm and diaphoretic as yesterday. Cardiac: RRR, 4-5/6 SEM at RUSB radiating to  carotids. No other arm/R/G auscultated.  No obvious edema Radials/DP/PT 2+ and equal bilaterally.  Respiratory:  Coarse ventilator breath sounds without significant rales or rhonchi. GI: Soft, nontender, nondistended, BS + x 4. Skin: warm and dry, no rash. Psych: Intubated  Labs    CBC  Recent Labs  08/03/16 0215 08/03/16 1605  WBC 16.1* 21.2*  HGB 7.4* 8.1*  HCT 25.3* 26.6*  MCV 84.9 85.0  PLT 169 814   Basic Metabolic Panel  Recent Labs  08/02/16 0308 08/03/16 0215 08/03/16 1605  NA 145 144 143  K 4.4 4.1 4.1  CL 99* 100* 100*  CO2 33* 29 29  GLUCOSE 112* 144* 127*  BUN 86* 108* 91*  CREATININE 5.47* 6.31* 5.17*  CALCIUM 8.4* 7.9* 8.0*  MG 2.7* 2.4  --   PHOS 5.4* 4.9* 4.2   Liver Function Tests  Recent Labs  08/03/16 1605  ALBUMIN 2.3*   No results for input(s): LIPASE, AMYLASE in the last 72 hours. Cardiac Enzymes No results for input(s): CKTOTAL, CKMB, CKMBINDEX, TROPONINI in the last 72 hours. BNP Invalid input(s): POCBNP D-Dimer No results for input(s): DDIMER in the last 72 hours. Hemoglobin A1C No results for input(s): HGBA1C in the last 72 hours. Fasting Lipid Panel No results for input(s): CHOL, HDL, LDLCALC, TRIG, CHOLHDL, LDLDIRECT in the last 72 hours. Thyroid Function Tests No results for input(s): TSH, T4TOTAL, T3FREE, THYROIDAB in the last 72 hours.  Invalid input(s): FREET3  Telemetry    NSR 80s-90s - Personally Reviewed  ECG    N/A  Radiology    Dg Chest Port 1 View  Result Date: 08/03/2016 CLINICAL DATA:  Hypoxia EXAM: PORTABLE CHEST 1 VIEW COMPARISON:  August 02, 2016 FINDINGS: Endotracheal tube tip is 3.0 cm above the carina. Nasogastric tube is again seen tube looped within the esophagus with the tip at the gastroesophageal junction. Central catheter tip is in the superior vena cava. No pneumothorax. There is cardiomegaly with pulmonary venous hypertension. There is airspace consolidation in both lower lobes as well as  in the left upper lobe. There is a moderate effusion on the left. IMPRESSION: Tube and catheter positions as described without pneumothorax. Note that the nasogastric tube remains looped in the esophagus with the tip near the gastroesophageal junction. Evidence a degree of congestive heart failure. Suspect areas of superimposed pneumonia, although the areas of airspace opacity could represent alveolar edema. Note that both alveolar edema and pneumonia may exist concurrently. These results will be called to the ordering clinician or representative by the Radiologist Assistant, and communication documented in the PACS or zVision Dashboard. Electronically Signed   By: Lowella Grip III M.D.   On: 08/03/2016 08:08   Dg Chest  Port 1 View  Result Date: 08/02/2016 CLINICAL DATA:  Evaluate central line placement. EXAM: PORTABLE CHEST 1 VIEW COMPARISON:  Chest radiograph August 02, 2016 at 0630 hours FINDINGS: Endotracheal tube tip projects 4.9 cm above the carina. Nasogastric tube looped looped in the distribution of the mid esophagus with distal tip projecting at GE junction. RIGHT internal jugular central venous catheter tip projects in mid superior vena cava. Patient rotated to the LEFT. The cardiac silhouette is moderately enlarged. Calcified aortic knob. Pulmonary vascular congestion and interstitial prominence. Bibasilar strandy densities with small to moderate LEFT pleural effusion. No pneumothorax. Soft tissue planes and included osseous structures are unchanged. IMPRESSION: Endotracheal tube tip projects 4.9 cm above the carina. Nasogastric tube looped looped in the distribution of the mid esophagus with distal tip projecting at GE junction. RIGHT internal jugular central venous catheter tip projects in mid superior vena cava. No pneumothorax. Stable cardiomegaly and findings of CHF with bibasilar probable atelectasis and small to moderate LEFT pleural effusion. Electronically Signed   By: Elon Alas M.D.   On: 08/02/2016 16:12   Dg Chest Port 1 View  Result Date: 08/02/2016 CLINICAL DATA:  Acute respiratory failure.  Intubated. EXAM: PORTABLE CHEST 1 VIEW COMPARISON:  08/01/2016 FINDINGS: Endotracheal tube terminates 3.4 cm above the carina. Enteric tube terminates over the distal esophagus, unchanged. The cardiac silhouette remains enlarged. Bilateral perihilar and basilar lung opacities have not significantly changed, including dense retrocardiac consolidation. No large pleural effusion or pneumothorax is identified. IMPRESSION: 1. Unchanged bilateral airspace disease. 2. Unchanged positioning of enteric tube, terminating over the distal esophagus. Electronically Signed   By: Logan Bores M.D.   On: 08/02/2016 08:05    Cardiac Studies   No new studies   Assessment & Plan   Trajan seems really doing better today. On day for the lower pressures then yesterday. Has now started CVVHD, and is also making urine consistently. He has received at least one unit of blood by the time I saw him today. He seems to be more alert.     Aspiration pneumonia ([place]) - appears to be more c/w pneumonitis Active Problems:       CKD (chronic kidney disease) stage 4, GFR 15-29 ml/min (HCC) Acute on Chronic Failure with Metabolic acidosis-- on CVVHD, nephrology consulted     Acute on chronic respiratory failure with hypoxia and hypercapnia ([place]) - after initial attempts to avoid intubation, he was finally intubated on Sat PM (11/18) --> very difficult, prolonged intubation with quite likely aspiration. Had a prolonged hypoxic time and was initially minimally responsive. Now seems to be improving. Still on high support.  Acute on chronic diastolic heart failure (HCC) with Acute pulmonary edema (HCC) - exacerbated by tachycardia. HR has improved   Hopefully with volume removal from CVVHD and his own diuresis his pulmonary edema will improve  Severe aortic stenosis: He does have severe aortic  stenosis which makes his condition even more tenuous. He is deathly volume sensitive as seen clearly will need preload. Therefore we need to avoid over diuresis/dialysis. His aerosols may make weaning from the ventilator in a more difficult than it would otherwise like BB. Would consider discussing the possibility of palliative aortic valvular balloon valvuloplasty next week if he is more stable from an infectious, and he make an renal standpoint -- the TAVR team is following.  Would need significant improvement prior to considering TAVR.    Anemia associated with chronic renal failure - transfusions ordered.  Leukocytosis - improving. Remains on Zosyn for possible aspiration pneumonitis     Abnormal movements     Essential hypertension - on pressors currently  I spent roughly half an hour talking directly with the patient's wife. This is my first opportunity talking to her during the patient's hospitalization. See touching base from a cardiology team standpoint. We don't have much to add currently, managed standpoint, but we have been involved during the entire process. Dr. Burt Knack and Dr. Roxy Manns are also very much aware and involved. I confirmed that we are in discussions with the pulmonary edema. At all times. I spent a long time answering questions about different management options and her concerns about various. We talked about the possibility of tracheostomy and as well as the concerns with dialysis. I explained to her concerns for aspiration pneumonia versus aspiration pneumonitis etc. We then talked about the possibility that was brought up by Dr. Roxy Manns about doing valvuloplasty. I explained this would be certainly could be considered if he were to become a little bit more stable over the course of next several days but we would defer to Dr. Antionette Char evaluation next week.   Signed, Glenetta Hew, MD  08/03/2016, 11:19 PM

## 2016-08-03 NOTE — Care Management Important Message (Signed)
Important Message  Patient Details  Name: Wesley [name] MRN: [id number] Date of Birth: 23-Jul-1946   Medicare Important Message Given:  Yes    Nathen May 08/03/2016, 12:35 PM

## 2016-08-04 ENCOUNTER — Inpatient Hospital Stay (HOSPITAL_COMMUNITY): Payer: Medicare Other

## 2016-08-04 DIAGNOSIS — N179 Acute kidney failure, unspecified: Secondary | ICD-10-CM

## 2016-08-04 LAB — GLUCOSE, CAPILLARY
GLUCOSE-CAPILLARY: 132 mg/dL — AB (ref 65–99)
GLUCOSE-CAPILLARY: 121 mg/dL — AB (ref 65–99)
GLUCOSE-CAPILLARY: 118 mg/dL — AB (ref 65–99)
GLUCOSE-CAPILLARY: 131 mg/dL — AB (ref 65–99)
GLUCOSE-CAPILLARY: 130 mg/dL — AB (ref 65–99)

## 2016-08-04 LAB — POCT ACTIVATED CLOTTING TIME
ACTIVATED CLOTTING TIME: 175 s
ACTIVATED CLOTTING TIME: 180 s
ACTIVATED CLOTTING TIME: 186 s
ACTIVATED CLOTTING TIME: 191 s
ACTIVATED CLOTTING TIME: 191 s
ACTIVATED CLOTTING TIME: 197 s
Activated Clotting Time: 186 seconds
Activated Clotting Time: 186 seconds
Activated Clotting Time: 191 seconds
Activated Clotting Time: 191 seconds
Activated Clotting Time: 191 seconds
Activated Clotting Time: 175 seconds
Activated Clotting Time: 180 seconds
Activated Clotting Time: 186 seconds

## 2016-08-04 LAB — BLOOD GAS, ARTERIAL
Acid-Base Excess: 5.3 mmol/L — ABNORMAL HIGH (ref 0.0–2.0)
BICARBONATE: 30.4 mmol/L — AB (ref 20.0–28.0)
Drawn by: 37101
FIO2: 50
LHR: 28 {breaths}/min
MECHVT: 440 mL
O2 Saturation: 92.1 %
PATIENT TEMPERATURE: 98.6
PCO2 ART: 54.8 mmHg — AB (ref 32.0–48.0)
PEEP/CPAP: 10 cmH2O
PO2 ART: 66.8 mmHg — AB (ref 83.0–108.0)
pH, Arterial: 7.363 (ref 7.350–7.450)

## 2016-08-04 LAB — CBC
HEMATOCRIT: 26.7 % — AB (ref 39.0–52.0)
HEMOGLOBIN: 7.8 g/dL — AB (ref 13.0–17.0)
MCH: 24.8 pg — ABNORMAL LOW (ref 26.0–34.0)
MCHC: 29.2 g/dL — AB (ref 30.0–36.0)
MCV: 85 fL (ref 78.0–100.0)
Platelets: 166 10*3/uL (ref 150–400)
RBC: 3.14 MIL/uL — ABNORMAL LOW (ref 4.22–5.81)
RDW: 17.3 % — ABNORMAL HIGH (ref 11.5–15.5)
WBC: 24.7 10*3/uL — AB (ref 4.0–10.5)

## 2016-08-04 LAB — RENAL FUNCTION PANEL
ALBUMIN: 2.3 g/dL — AB (ref 3.5–5.0)
ANION GAP: 12 (ref 5–15)
Albumin: 2.4 g/dL — ABNORMAL LOW (ref 3.5–5.0)
Anion gap: 12 (ref 5–15)
BUN: 74 mg/dL — AB (ref 6–20)
BUN: 59 mg/dL — ABNORMAL HIGH (ref 6–20)
CHLORIDE: 101 mmol/L (ref 101–111)
CHLORIDE: 99 mmol/L — AB (ref 101–111)
CO2: 28 mmol/L (ref 22–32)
CO2: 28 mmol/L (ref 22–32)
Calcium: 8.1 mg/dL — ABNORMAL LOW (ref 8.9–10.3)
Calcium: 8.2 mg/dL — ABNORMAL LOW (ref 8.9–10.3)
Creatinine, Ser: 4.19 mg/dL — ABNORMAL HIGH (ref 0.61–1.24)
Creatinine, Ser: 3.46 mg/dL — ABNORMAL HIGH (ref 0.61–1.24)
GFR, EST AFRICAN AMERICAN: 15 mL/min — AB (ref >60)
GFR, EST AFRICAN AMERICAN: 19 mL/min — AB (ref >60)
GFR, EST NON AFRICAN AMERICAN: 13 mL/min — AB (ref >60)
GFR, EST NON AFRICAN AMERICAN: 16 mL/min — AB (ref >60)
Glucose, Bld: 150 mg/dL — ABNORMAL HIGH (ref 65–99)
Glucose, Bld: 123 mg/dL — ABNORMAL HIGH (ref 65–99)
POTASSIUM: 4.1 mmol/L (ref 3.5–5.1)
POTASSIUM: 4.4 mmol/L (ref 3.5–5.1)
Phosphorus: 3.6 mg/dL (ref 2.5–4.6)
Phosphorus: 3.9 mg/dL (ref 2.5–4.6)
Sodium: 141 mmol/L (ref 135–145)
Sodium: 139 mmol/L (ref 135–145)

## 2016-08-04 LAB — APTT: APTT: 57 s — AB (ref 24–36)

## 2016-08-04 LAB — TYPE AND SCREEN
ABO/RH(D): O NEG
ANTIBODY SCREEN: NEGATIVE
Unit division: 0

## 2016-08-04 LAB — MAGNESIUM: Magnesium: 2.3 mg/dL (ref 1.7–2.4)

## 2016-08-04 MED ORDER — DOCUSATE SODIUM 50 MG/5ML PO LIQD: 100.0000 mg | Freq: Two times a day (BID) | ORAL | Status: DC | PRN

## 2016-08-04 MED ORDER — MIDAZOLAM HCL 2 MG/2ML IJ SOLN
1.0000 mg | INTRAMUSCULAR | Status: DC | PRN
  Filled 2016-08-04: qty 2

## 2016-08-04 MED ORDER — FAMOTIDINE 40 MG/5ML PO SUSR
20.0000 mg | Freq: Every day | ORAL | Status: DC
  Administered 2016-08-04: 20 mg
  Filled 2016-08-04: qty 2.5

## 2016-08-04 MED ORDER — MIDAZOLAM HCL 2 MG/2ML IJ SOLN
1.0000 mg | INTRAMUSCULAR | Status: DC | PRN
Start: 2016-08-04 — End: 2016-08-05
  Administered 2016-08-04 – 2016-08-05 (×2): 1 mg via INTRAVENOUS
  Filled 2016-08-04 (×3): qty 2

## 2016-08-04 NOTE — Progress Notes (Signed)
Chart reviewed at length since I had seen him last week. New developments including VDRF and renal failure with CRRT noted. Cardiology will re-assess candidacy for palliative aortic balloon valvuloplasty next week. I agree with my partners and Dr. Roxy Manns that he is not a TAVR candidate at present time.  Pixie Casino, MD, [facility] Attending Cardiologist [place]

## 2016-08-04 NOTE — Progress Notes (Signed)
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, nonoliguric 2 VDRF w/ infiltrates on CXR 3 AS, needs intervention 4 Myoclonus, resolved 5 Anemia, s/p PBCs  Plan: Cont with CRRT & UF as tol   Subjective: Interval History: Tol UF on pressors  Objective: Vital signs in last 24 hours: Temp:  [97 F (36.1 C)-100.4 F (38 C)] 98.7 F (37.1 C) (11/23 0802) Pulse Rate:  [74-107] 96 (11/23 1115) Resp:  [14-31] 15 (11/23 1115) BP: (83-157)/(52-115) 96/69 (11/23 1115) SpO2:  [90 %-100 %] 93 % (11/23 1115) FiO2 (%):  [50 %-70 %] 50 % (11/23 0805) Weight:  [124.2 kg (273 lb 13 oz)] 124.2 kg (273 lb 13 oz) (11/23 0404) Weight change: -0.5 kg (-1 lb 1.6 oz)  Intake/Output from previous day: 11/22 0701 - 11/23 0700 In: 3116.6 [I.V.:679.6; Blood:335; NG/GT:1620; IV Piggyback:482] Out: 4651 [Urine:2365] Intake/Output this shift: Total I/O In: 385.2 [I.V.:125.2; NG/GT:260] Out: 587 [Urine:285; Other:302]  General appearance: alert and cooperative GI: soft, non-tender; bowel sounds normal; no masses,  no organomegaly Neurologic: Grossly normal  Lab Results:  Recent Labs  08/03/16 1605 08/04/16 0300  WBC 21.2* 24.7*  HGB 8.1* 7.8*  HCT 26.6* 26.7*  PLT 165 166   BMET:  Recent Labs  08/03/16 1605 08/04/16 0300  NA 143 141  K 4.1 4.1  CL 100* 101  CO2 29 28  GLUCOSE 127* 150*  BUN 91* 74*  CREATININE 5.17* 4.19*  CALCIUM 8.0* 8.1*   No results for input(s): PTH in the last 72 hours. Iron Studies: No results for input(s): IRON, TIBC, TRANSFERRIN, FERRITIN in the last 72 hours. Studies/Results: Dg Chest Port 1 View  Result Date: 08/04/2016 CLINICAL DATA:  Acute respiratory failure. EXAM: PORTABLE CHEST 1 VIEW COMPARISON:  08/03/2016; 08/02/2016; 07/31/2016; 07/30/2016 FINDINGS: Grossly unchanged enlarged cardiac silhouette and mediastinal contours given patient rotation. Endotracheal tube overlies tracheal air column with tip approximately 3.1 cm above the carina.  Enteric to tip and side port again project over the expected location the distal esophagus with redundant tubing overlying the superior/mid esophagus. Similar findings of rather extensive bilateral slightly nodular heterogeneous and consolidative airspace opacities, left greater than right. Small left-sided effusion is not excluded. No pneumothorax. No definite evidence of edema. No acute osseus abnormalities. IMPRESSION: 1. Stable position of support apparatus including malposition enteric tube as above. No pneumothorax. 2. Similar findings of extensive bilateral heterogeneous airspace opacities, left greater than right, again worrisome for multifocal infection. Electronically Signed   By: Simonne Come M.D.   On: 08/04/2016 09:41   Dg Chest Port 1 View  Result Date: 08/03/2016 CLINICAL DATA:  Hypoxia EXAM: PORTABLE CHEST 1 VIEW COMPARISON:  August 02, 2016 FINDINGS: Endotracheal tube tip is 3.0 cm above the carina. Nasogastric tube is again seen tube looped within the esophagus with the tip at the gastroesophageal junction. Central catheter tip is in the superior vena cava. No pneumothorax. There is cardiomegaly with pulmonary venous hypertension. There is airspace consolidation in both lower lobes as well as in the left upper lobe. There is a moderate effusion on the left. IMPRESSION: Tube and catheter positions as described without pneumothorax. Note that the nasogastric tube remains looped in the esophagus with the tip near the gastroesophageal junction. Evidence a degree of congestive heart failure. Suspect areas of superimposed pneumonia, although the areas of airspace opacity could represent alveolar edema. Note that both alveolar edema and pneumonia may exist concurrently. These results will be called to the ordering clinician or representative by the  Psychologist, clinical, and communication documented in the PACS or zVision Dashboard. Electronically Signed   By: Lowella Grip III M.D.   On:  08/03/2016 08:08   Dg Chest Port 1 View  Result Date: 08/02/2016 CLINICAL DATA:  Evaluate central line placement. EXAM: PORTABLE CHEST 1 VIEW COMPARISON:  Chest radiograph August 02, 2016 at 0630 hours FINDINGS: Endotracheal tube tip projects 4.9 cm above the carina. Nasogastric tube looped looped in the distribution of the mid esophagus with distal tip projecting at GE junction. RIGHT internal jugular central venous catheter tip projects in mid superior vena cava. Patient rotated to the LEFT. The cardiac silhouette is moderately enlarged. Calcified aortic knob. Pulmonary vascular congestion and interstitial prominence. Bibasilar strandy densities with small to moderate LEFT pleural effusion. No pneumothorax. Soft tissue planes and included osseous structures are unchanged. IMPRESSION: Endotracheal tube tip projects 4.9 cm above the carina. Nasogastric tube looped looped in the distribution of the mid esophagus with distal tip projecting at GE junction. RIGHT internal jugular central venous catheter tip projects in mid superior vena cava. No pneumothorax. Stable cardiomegaly and findings of CHF with bibasilar probable atelectasis and small to moderate LEFT pleural effusion. Electronically Signed   By: Elon Alas M.D.   On: 08/02/2016 16:12    Scheduled: . aspirin  81 mg Per Tube Daily  . brinzolamide  1 drop Both Eyes BID   And  . brimonidine  1 drop Both Eyes BID  . chlorhexidine gluconate (MEDLINE KIT)  15 mL Mouth Rinse BID  . famotidine  20 mg Per Tube QHS  . feeding supplement (PRO-STAT SUGAR FREE 64)  30 mL Per Tube Daily  . feeding supplement (VITAL HIGH PROTEIN)  1,000 mL Per Tube Q24H  . furosemide  160 mg Intravenous Q6H  . ipratropium-albuterol  3 mL Nebulization QID  . levothyroxine  150 mcg Per Tube BH-q7a  . mouth rinse  15 mL Mouth Rinse QID  . piperacillin-tazobactam  3.375 g Intravenous Q6H  . pravastatin  20 mg Per Tube QHS  . QUEtiapine  100 mg Per Tube QHS  .  senna  1 tablet Per Tube Daily  . sertraline  150 mg Per Tube Daily  . sodium chloride flush  3 mL Intravenous Q12H     LOS: 9 days   Amario Longmore C 08/04/2016,11:24 AM
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
## 2016-08-05 ENCOUNTER — Inpatient Hospital Stay (HOSPITAL_COMMUNITY): Payer: Medicare Other

## 2016-08-05 ENCOUNTER — Ambulatory Visit (HOSPITAL_COMMUNITY): Payer: Medicare Other

## 2016-08-05 LAB — POCT ACTIVATED CLOTTING TIME
ACTIVATED CLOTTING TIME: 197 s
ACTIVATED CLOTTING TIME: 202 s
Activated Clotting Time: 213 seconds

## 2016-08-05 LAB — RENAL FUNCTION PANEL
ALBUMIN: 2.5 g/dL — AB (ref 3.5–5.0)
ANION GAP: 12 (ref 5–15)
BUN: 53 mg/dL — ABNORMAL HIGH (ref 6–20)
CALCIUM: 8.4 mg/dL — AB (ref 8.9–10.3)
CO2: 27 mmol/L (ref 22–32)
CREATININE: 2.97 mg/dL — AB (ref 0.61–1.24)
Chloride: 99 mmol/L — ABNORMAL LOW (ref 101–111)
GFR, EST AFRICAN AMERICAN: 23 mL/min — AB (ref >60)
GFR, EST NON AFRICAN AMERICAN: 20 mL/min — AB (ref >60)
Glucose, Bld: 129 mg/dL — ABNORMAL HIGH (ref 65–99)
PHOSPHORUS: 3.5 mg/dL (ref 2.5–4.6)
Potassium: 4.4 mmol/L (ref 3.5–5.1)
SODIUM: 138 mmol/L (ref 135–145)

## 2016-08-05 LAB — CULTURE, RESPIRATORY W GRAM STAIN

## 2016-08-05 LAB — CBC
HEMATOCRIT: 27.5 % — AB (ref 39.0–52.0)
Hemoglobin: 8.1 g/dL — ABNORMAL LOW (ref 13.0–17.0)
MCH: 25.4 pg — ABNORMAL LOW (ref 26.0–34.0)
MCHC: 29.5 g/dL — AB (ref 30.0–36.0)
MCV: 86.2 fL (ref 78.0–100.0)
PLATELETS: 154 10*3/uL (ref 150–400)
RBC: 3.19 MIL/uL — ABNORMAL LOW (ref 4.22–5.81)
RDW: 17.9 % — AB (ref 11.5–15.5)
WBC: 20.5 10*3/uL — AB (ref 4.0–10.5)

## 2016-08-05 LAB — MAGNESIUM: Magnesium: 2.4 mg/dL (ref 1.7–2.4)

## 2016-08-05 LAB — GLUCOSE, CAPILLARY
GLUCOSE-CAPILLARY: 129 mg/dL — AB (ref 65–99)
Glucose-Capillary: 126 mg/dL — ABNORMAL HIGH (ref 65–99)

## 2016-08-05 LAB — APTT: APTT: 66 s — AB (ref 24–36)

## 2016-08-05 MED ORDER — MIDAZOLAM HCL 2 MG/2ML IJ SOLN
4.0000 mg | Freq: Once | INTRAMUSCULAR | Status: AC
  Administered 2016-08-05: 4 mg via INTRAVENOUS

## 2016-08-05 MED FILL — Medication: Qty: 1 | Status: AC

## 2016-08-06 MED FILL — Medication: Qty: 1 | Status: AC

## 2016-08-07 LAB — CULTURE, BLOOD (ROUTINE X 2)
Culture: NO GROWTH
Culture: NO GROWTH

## 2016-08-08 ENCOUNTER — Telehealth: Payer: Self-pay

## 2016-08-08 LAB — GLUCOSE, CAPILLARY
GLUCOSE-CAPILLARY: 103 mg/dL — AB (ref 65–99)
Glucose-Capillary: 138 mg/dL — ABNORMAL HIGH (ref 65–99)

## 2016-08-08 LAB — POCT ACTIVATED CLOTTING TIME
ACTIVATED CLOTTING TIME: 191 s
ACTIVATED CLOTTING TIME: 202 s

## 2016-08-08 NOTE — Telephone Encounter (Signed)
On 08/08/2016 I received a death certificate from [place] (original). The death certificate is for burial. The patient is a patient of Doctor Maneem. The death certificate will be taken to Pulmonary Unit @ [facility] tomorrow am for signature.  On 2016-08-28 I received the death certificate back from Doctor Maneem. I got the death certificate ready and called the funeral home to let them know the death certificate is ready for pickup.

## 2016-08-10 ENCOUNTER — Telehealth: Payer: Self-pay | Admitting: Pulmonary Disease

## 2016-08-11 ENCOUNTER — Telehealth: Payer: Self-pay

## 2016-08-11 NOTE — Telephone Encounter (Signed)
Called and spoke to pt's wife. Pt's wife is requesting "repiratory failure" and chronic pulmonary disease" be listed as contributing factors on pt's death certificate, PM signed death certificate. Pt's wife states the funeral home is able to bring it by today to be changed if PM is willing.   Dr. Vaughan Browner please advise. Thanks.

## 2016-08-11 NOTE — Telephone Encounter (Signed)
On 08/11/2016 I received a death certificate from [employer]. The death certificate is for burial. The patient is a patient of Doctor Maneem. The death certificate will be taken to Pulmonary Unit tomorrow am for the physician to fix something on the death certificate.  On 09-08-2016 I received the death certificate back form Doctor Maneem. I got the death certificate ready and called the funeral home to let them know the death certificate is ready for pickup.

## 2016-08-11 NOTE — Telephone Encounter (Signed)
Wife (karen) called this morning still waiting to hear about previous message...[phone].Mearl Latin

## 2016-08-12 ENCOUNTER — Ambulatory Visit (HOSPITAL_COMMUNITY): Payer: Medicare Other

## 2016-08-12 NOTE — Code Documentation (Signed)
CODE BLUE NOTE  Patient Name: Wesley [name]   MRN: [id number]   Date of Birth/ Sex: [birth year] , male      Admission Date: 07/20/2016  Attending Provider: Javier Glazier, MD  Primary Diagnosis: arotic stenosis    Indication: Pt was in his usual state of health until this PM, when he was noted to be bradycardic in th 31s and unresponsive s/p bronchoscopy. Code blue was subsequently called. At the time of arrival on scene, ACLS protocol was underway.   Technical Description:  - CPR performance duration:  30 minutes  - Was defibrillation or cardioversion used? Yes   - Was external pacer placed? No  - Was patient intubated pre/post CPR? Yes    Medications Administered: Y = Yes; Blank = No Amiodarone  N  Atropine  3  Calcium  N  Epinephrine  3  Lidocaine  N  Magnesium  N  Norepinephrine  N  Phenylephrine  N  Sodium bicarbonate  1  Vasopressin  N  Other N    Post CPR evaluation:  - Final Status - Was patient successfully resuscitated ? No   Miscellaneous Information:  - Time of death:  1311 PM  - Primary team notified?  Yes  - Family Notified? Yes        Grindstone Bing, DO   09-02-16, 1:18 PM

## 2016-08-12 NOTE — Progress Notes (Signed)
Upon arrival to patient room patient was noted to have decreased sats of 81%.  Also noted to have increased peak pressure on ventilator in the 40s.  Took patient off of ventilator and bagged lavaged patient.  Obtained a small amount of thick bright red plugs.  Increased FIO2 to 100% and paged MD.  MD ordered chest xray and bronch to be performed.  Will continue to monitor.

## 2016-08-12 NOTE — Significant Event (Addendum)
Received order for cortrak feeding tube this morning. Placed via left nare-marked at 109cm at the nare. Initially, could not pass tube past distal esophagus, tried repositioning patient. Last attempt made via left nare, went in without difficulty at all. Pink taped and secured. KUB ordered. Once tip is confirmed, staff can use the tube.

## 2016-08-12 NOTE — Procedures (Signed)
PCCM Bronchoscopy Procedure Note  The patient was informed of the risks (including but not limited to bleeding, infection, respiratory failure, lung injury, tooth/oral injury) and benefits of the procedure and gave consent, see chart.  Indication: Hypoxia, aspiration  Post Procedure Diagnosis: Same  Condition pre procedure: Stable  Medications for procedure: 2 mg versed, fentanyl drip  Procedure description: The bronchoscope was introduced through the endotracheal tube and passed to the bilateral lungs to the level of the subsegmental bronchi throughout the tracheobronchial tree.  Airway exam revealed friable, collapsible mucosa, blood noted in RLL. BAL performed in RLL. No obvious site of active bleeding noted.   Procedures performed: BAL  Specimens sent: Culture, cell count, cytology.  Condition post procedure: Stable  EBL: 0 cc  Complications: None.  Marshell Garfinkel MD [facility] Pager [phone] If no answer or after 3pm call: [phone] 2016-08-27, 12:44 PM

## 2016-08-12 NOTE — Telephone Encounter (Signed)
PM the pts wife is calling back and very upset that we have not gotten back with her about this.  Please advise. Thanks

## 2016-08-12 NOTE — Progress Notes (Signed)
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, nonoliguric 2 VDRF w/ infiltrates on CXR 3 AS, needs intervention 4 Myoclonus, resolved 5 Anemia, s/p PBCs  Plan: Cont with CRRT & UF as tol, I spoke with wife and answered questions  Subjective: Interval History: neg balance Objective: Vital signs in last 24 hours: Temp:  [97.7 F (36.5 [name])-98.9 F (37.2 [name])] 97.7 F (36.5 [name]) (11/24 0834) Pulse Rate:  [66-107] 81 (11/24 1045) Resp:  [8-30] 21 (11/24 1045) BP: (86-154)/(52-98) 101/65 (11/24 1045) SpO2:  [90 %-100 %] 93 % (11/24 1045) FiO2 (%):  [45 %-50 %] 45 % (11/24 0815) Weight:  [120.1 kg (264 lb 12.4 oz)] 120.1 kg (264 lb 12.4 oz) (11/24 0500) Weight change: -4.1 kg (-9 lb 0.6 oz)  Intake/Output from previous day: 11/23 0701 - 11/24 0700 In: 2891.9 [I.V.:843.9; NG/GT:1650; IV Piggyback:398] Out: 9417 [Urine:758] Intake/Output this shift: Total I/O In: 372.5 [I.V.:152.5; NG/GT:220] Out: 723 [Other:723]  General appearance: alert Chest wall: no tenderness Cardio: regular rate and rhythm, S1, S2 normal, no murmur, click, rub or gallop Extremities: edema 1 to tr  Lab Results:  Recent Labs  08/04/16 0300 08-06-16 0430  WBC 24.7* 20.5*  HGB 7.8* 8.1*  HCT 26.7* 27.5*  PLT 166 154   BMET:  Recent Labs  08/04/16 1600 August 06, 2016 0400  NA 139 138  K 4.4 4.4  CL 99* 99*  CO2 28 27  GLUCOSE 123* 129*  BUN 59* 53*  CREATININE 3.46* 2.97*  CALCIUM 8.2* 8.4*   No results for input(s): PTH in the last 72 hours. Iron Studies: No results for input(s): IRON, TIBC, TRANSFERRIN, FERRITIN in the last 72 hours. Studies/Results: Dg Chest Portable 1 View  Result Date: 2016-08-06 CLINICAL DATA:  Respiratory failure. History of cancer, congestive heart failure and hypertension. EXAM: PORTABLE CHEST 1 VIEW COMPARISON:  Radiographs 08/04/2016 and 08/03/2016. FINDINGS: 0634 hours. The endotracheal tube is unchanged in the mid trachea. Right IJ central venous catheter projects to  lower SVC level. The nasogastric tube remains coiled within the esophagus, as it has been since 08/02/2016. Its tip overlies the distal esophagus. There is stable cardiomegaly and aortic atherosclerosis. There are persistent left-greater-than-right basilar airspace opacities, slowly improving over the last several days. There may be a small amount of pleural fluid on the left. No evidence of pneumothorax. IMPRESSION: 1. Slowly improving left-greater-than-right basilar airspace opacities, most consistent with multifocal pneumonia, possibly on the basis of aspiration. 2. Stable support system. The enteric tube remains coiled in the esophagus, as it has been over the last 3 days. Electronically Signed   By: Richardean Sale M.D.   On: 08/06/16 07:27   Dg Chest Port 1 View  Result Date: 08/04/2016 CLINICAL DATA:  Acute respiratory failure. EXAM: PORTABLE CHEST 1 VIEW COMPARISON:  08/03/2016; 08/02/2016; 07/31/2016; 07/30/2016 FINDINGS: Grossly unchanged enlarged cardiac silhouette and mediastinal contours given patient rotation. Endotracheal tube overlies tracheal air column with tip approximately 3.1 cm above the carina. Enteric to tip and side port again project over the expected location the distal esophagus with redundant tubing overlying the superior/mid esophagus. Similar findings of rather extensive bilateral slightly nodular heterogeneous and consolidative airspace opacities, left greater than right. Small left-sided effusion is not excluded. No pneumothorax. No definite evidence of edema. No acute osseus abnormalities. IMPRESSION: 1. Stable position of support apparatus including malposition enteric tube as above. No pneumothorax. 2. Similar findings of extensive bilateral heterogeneous airspace opacities, left greater than right, again worrisome for multifocal infection. Electronically Signed  By: Sandi Mariscal M.D.   On: 08/04/2016 09:41    Scheduled: . aspirin  81 mg Per Tube Daily  . brinzolamide   1 drop Both Eyes BID   And  . brimonidine  1 drop Both Eyes BID  . chlorhexidine gluconate (MEDLINE KIT)  15 mL Mouth Rinse BID  . famotidine  20 mg Per Tube QHS  . feeding supplement (PRO-STAT SUGAR FREE 64)  30 mL Per Tube Daily  . feeding supplement (VITAL HIGH PROTEIN)  1,000 mL Per Tube Q24H  . furosemide  160 mg Intravenous Q6H  . ipratropium-albuterol  3 mL Nebulization QID  . levothyroxine  150 mcg Per Tube [id number]  . mouth rinse  15 mL Mouth Rinse QID  . piperacillin-tazobactam  3.375 g Intravenous Q6H  . pravastatin  20 mg Per Tube QHS  . QUEtiapine  100 mg Per Tube QHS  . senna  1 tablet Per Tube Daily  . sertraline  150 mg Per Tube Daily  . sodium chloride flush  3 mL Intravenous Q12H     LOS: 10 days   Wesley [name] August 19, 2016,10:58 AM

## 2016-08-12 NOTE — Progress Notes (Signed)
   08/20/2016 1300  Clinical Encounter Type  Visited With Patient and family together  Visit Type Code  Spiritual Encounters  Spiritual Needs Prayer;Grief support  Stress Factors  Patient Stress Factors Loss  Family Stress Factors Family relationships;Major life changes  Responded to code blue. Introduction to family. Offered prayer and grief support.

## 2016-08-12 NOTE — Plan of Care (Signed)
Problem: Fluid Volume: Goal: Ability to maintain a balanced intake and output will improve Outcome: Completed/Met Date Met: 08/26/16 Switched to new path

## 2016-08-12 NOTE — Significant Event (Signed)
Pt tolerated the bronch well but about 15 mins post procedure he became increasingly bradycardic and hypotensive. Went to PEA, V fib arrest.  Coded for 30 min. See separate note. Discussed with Dr. Ellyn Hack and family. Decision was made to stop the code after 30 mins.  Condolences offered to family.   Marshell Garfinkel MD [facility] Pager [phone] If no answer or after 3pm call: [phone] 08-27-2016, 1:24 PM

## 2016-08-12 NOTE — Telephone Encounter (Signed)
Yes. I can sign it again if the funeral home can bring it in today. Call and let the wife know.

## 2016-08-12 NOTE — Plan of Care (Signed)
1230 BP down, NS at wide open, levophed back to 10 mcg 1240 no response to fluid and levophed, heart rate down to 30's Code called CPR started, Dr. Vaughan Browner paged, see code sheet
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
## 2016-08-12 NOTE — Telephone Encounter (Signed)
PM just signed the death certificate and adjusted the diagnoses as requested Sharyn Lull with medical records is aware and will be by shortly to pick it up

## 2016-08-12 NOTE — Plan of Care (Signed)
100 cc fentanyl wasted in sink witnessed by [facility] RN

## 2016-08-12 NOTE — Progress Notes (Signed)
On arrival, noted increase PIP pressures in the mid 40's and rhonchus breath sounds. Pt was manually ventilated bagged lavage 10cc NS flush. 100% oxygen therapy administered with flow resistor valve. Pt was suctioned got back copious amount of thick tan yellow secretions and mucous plugs. Several mucous plugs were noted throughout suctioning procedure. Pt tolerated it well.

## 2016-08-12 NOTE — Telephone Encounter (Signed)
Spoke with Wesley [name] regarding this, states that medical records has already picked up addended death certificate and that [name] has already coordinated with our medical records department to receive this.    Spoke with pt's wife, aware of this.  Nothing further needed.

## 2016-08-12 NOTE — Telephone Encounter (Signed)
Pt wife is calling again a/b this death certificate, wife is wanting a call back to let her back @ [phone] this morning to let her know what is going on, she was in a bit of a state of being upset, but I think I calmed her down a bit, but please call her and let her know what's going on, b/c she says no one has told her anything and that is what's upsetting to her the most.Stanley A Dalton

## 2016-08-12 DEATH — deceased

## 2016-08-15 ENCOUNTER — Other Ambulatory Visit: Payer: Medicare Other

## 2016-08-15 ENCOUNTER — Ambulatory Visit: Payer: Medicare Other

## 2016-08-15 NOTE — Telephone Encounter (Signed)
VS please advise if we can sign off of this message. thanks

## 2016-08-17 ENCOUNTER — Ambulatory Visit: Payer: Medicare Other

## 2016-08-17 ENCOUNTER — Other Ambulatory Visit: Payer: Medicare Other

## 2016-08-17 ENCOUNTER — Encounter: Payer: Medicare Other | Admitting: Surgery

## 2016-08-19 NOTE — Telephone Encounter (Signed)
VS please advise.  thanks 

## 2016-08-20 ENCOUNTER — Other Ambulatory Visit: Payer: Self-pay | Admitting: Nurse Practitioner
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
## 2016-09-14 ENCOUNTER — Other Ambulatory Visit: Payer: Medicare Other

## 2016-09-14 ENCOUNTER — Ambulatory Visit: Payer: Medicare Other

## 2016-10-07 ENCOUNTER — Ambulatory Visit: Payer: Medicare Other | Admitting: Pulmonary Disease

## 2016-10-10 ENCOUNTER — Ambulatory Visit: Payer: Medicare Other

## 2016-10-10 ENCOUNTER — Other Ambulatory Visit: Payer: Medicare Other

## 2016-10-12 ENCOUNTER — Ambulatory Visit: Payer: Medicare Other

## 2016-10-12 ENCOUNTER — Other Ambulatory Visit: Payer: Medicare Other

## 2016-11-07 ENCOUNTER — Other Ambulatory Visit: Payer: Medicare Other

## 2016-11-07 ENCOUNTER — Ambulatory Visit: Payer: Medicare Other

## 2016-11-09 ENCOUNTER — Ambulatory Visit: Payer: Medicare Other

## 2016-11-09 ENCOUNTER — Other Ambulatory Visit: Payer: Medicare Other

## 2016-12-05 ENCOUNTER — Ambulatory Visit: Payer: Medicare Other

## 2016-12-05 ENCOUNTER — Ambulatory Visit: Payer: Medicare Other | Admitting: Hematology and Oncology

## 2016-12-05 ENCOUNTER — Other Ambulatory Visit: Payer: Medicare Other

## 2016-12-07 ENCOUNTER — Ambulatory Visit: Payer: Medicare Other

## 2016-12-07 ENCOUNTER — Other Ambulatory Visit: Payer: Medicare Other

## 2016-12-08 ENCOUNTER — Ambulatory Visit: Payer: Medicare Other

## 2016-12-08 ENCOUNTER — Other Ambulatory Visit: Payer: Medicare Other

## 2016-12-08 ENCOUNTER — Ambulatory Visit: Payer: Medicare Other | Admitting: Hematology and Oncology

## 2017-01-26 ENCOUNTER — Telehealth: Payer: Self-pay | Admitting: Pulmonary Disease

## 2017-01-26 ENCOUNTER — Telehealth: Payer: Self-pay | Admitting: Cardiovascular Disease

## 2017-01-26 NOTE — Telephone Encounter (Signed)
Spoke with Wife Santiago Glad she is aware she will need to get note from Physician who signed d/c. I have placed paperwork she dropped off at front desk for pick up.

## 2017-01-26 NOTE — Telephone Encounter (Signed)
Patients wife dropped off copy of d/c and a letter from [place]. She was asking if a note could be wrote on letterhead stating Ischemic Heart Disease was a factor in his death. The [facility] will not [facility] what's on the d/c which is Severe Aortic Stenosis.   After speaking with Lauren and looking at the d/c Dr.Cooper is not the one who signed d/c. The patient will need to contact physician who signed d/c and get letter wrote.   I called patient and left her a voicemail for her to return my call.

## 2017-01-26 NOTE — Telephone Encounter (Signed)
Form is in PM's lookat folder. PM please advise.  Thanks!

## 2017-01-26 NOTE — Telephone Encounter (Signed)
New Message  Pt wife call requesting to speak with RN about getting a letter from Dr. Burt Knack to the [place]. Pt wife states she will further explain when called back.

## 2017-01-27 NOTE — Telephone Encounter (Signed)
I will take a look on Monday 5/21 when I am in office.   PM

## 2017-01-31 NOTE — Telephone Encounter (Signed)
PM please advise of any update on forms sent to you.  thanks

## 2017-02-01 NOTE — Telephone Encounter (Signed)
Letter has been written and signed by PM.  Pt's spouse is aware. Nothing further needed.

## 2017-02-01 NOTE — Telephone Encounter (Signed)
Ok to write a letter  To [place] [employer],  I am writing in reference to [employer], DOB 31-Jan-2046.  I was the doctor taking care of him during his last hospitalization and I believe that he had Ischemic Heart Disease which has contributed to his death. Please let me know if you have nay questions  Praven Robie Oats

## 2017-07-05 ENCOUNTER — Telehealth: Payer: Self-pay | Admitting: Cardiovascular Disease

## 2017-07-05 NOTE — Telephone Encounter (Signed)
New message    Pt wife is calling asking for a call back from nurse. She said she has some questions about pt diagnosis.

## 2017-07-05 NOTE — Telephone Encounter (Signed)
Wife phoned needing forms for [facility] filled out to cover her benefits. Forms needs to have diagnosis of CAD or ischemic heart disease for her to receive full benefits. Will forward to Dr Burt Knack for review

## 2017-07-06 ENCOUNTER — Telehealth: Payer: Self-pay | Admitting: Cardiovascular Disease

## 2017-07-06 NOTE — Telephone Encounter (Signed)
Clarified with RN who spoke with patient's wife yesterday. [name] wants a note from patient's cardiologist stating the patient had CAD or ischemic heart disease so she will get full benefits from the [place] after the patient's death last year.  In Feb 08, 2017, Dr. Vaughan Browner wrote a letter (under letter tab): "[place] [employer],  Above patient Wesley [name], was under my care during his last hospitalization. I believe that he had Ischemic Heart Disease, which has contributed to his death. Please let me know if you have any questions or concerns."  The deceased patient's wife is not listed as DPR.  To Medical Records for correct follow-up.

## 2017-07-06 NOTE — Telephone Encounter (Signed)
Called spoke with Wife. Needed to clarify what paperwork she needed completed. After speaking with her she stated she did not paperwork completed she needed medical records for x 10 years. I asked her to make sure she had the correct paperwork in hand in order to get the records. Patient understood and will come in and sign papers

## 2017-07-19 NOTE — Telephone Encounter (Signed)
See below. As of today, patient still has not come in to talk with Medical Records. Medical Records will notify Nursing when complete and give instructions (if needed).   Wesley [name], Wesley [name]      Telephone Encounter  Signed  Encounter Date:  07/06/2017          Signed          Called spoke with Wife. Needed to clarify what paperwork she needed completed. After speaking with her she stated she did not paperwork completed she needed medical records for x 10 years. I asked her to make sure she had the correct paperwork in hand in order to get the records. Patient understood and will come in and sign papers

## 2017-09-25 IMAGING — CR DG CHEST 1V PORT
1 series · 1 of 1 positions shown · non-contrast
Comparison: 07/31/2016 .

ADDENDUM:
These results will be called to the ordering clinician or
representative by the Radiologist Assistant, and communication
documented in the PACS or zVision Dashboard.
CLINICAL DATA: Respiratory failure.

EXAM:
PORTABLE CHEST 1 VIEW

[AP]
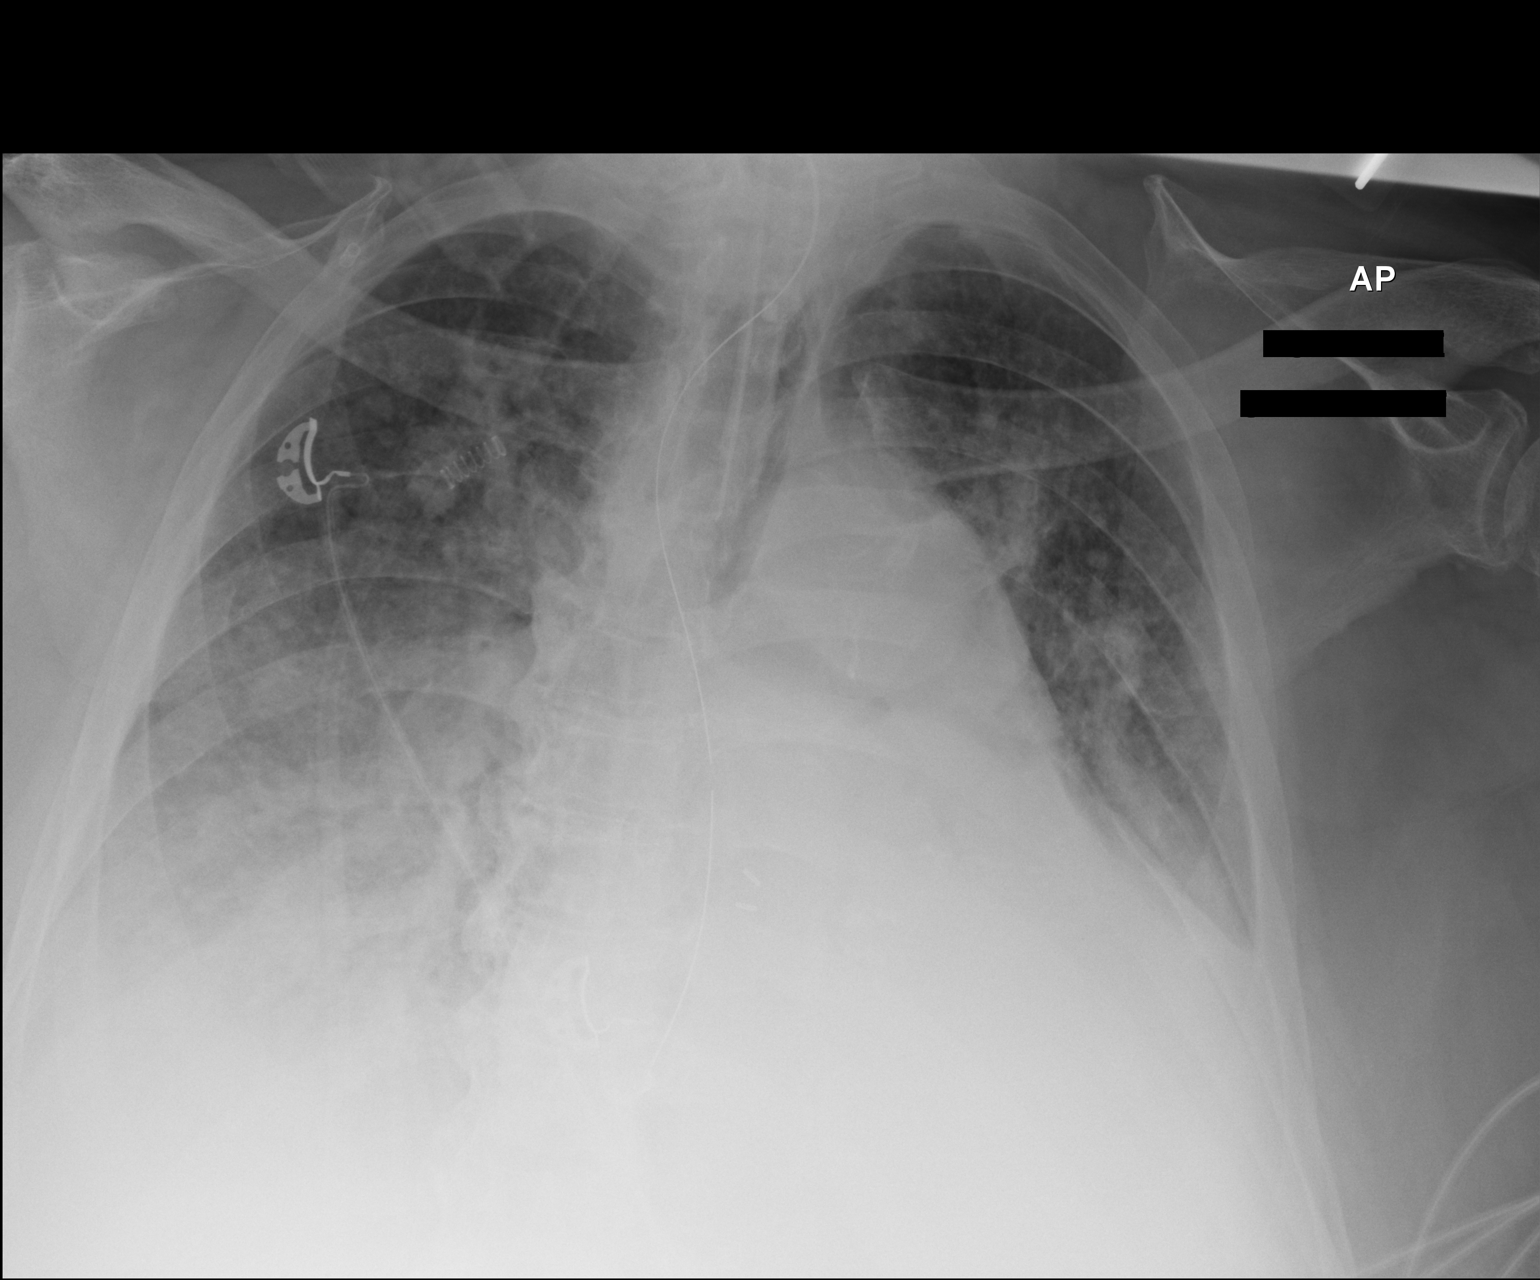

[1 of 1 positions shown; findings below may reference images not displayed]

FINDINGS: Interim placement of NG tube, its tip is at the gastroesophageal
junction, advancement of approximately 12 cm suggested .
Endotracheal tube in stable position. Cardiomegaly with diffuse
bilateral pulmonary infiltrates consistent with pulmonary edema.
Bilateral pneumonia cannot be excluded. Bilateral pleural effusions.
No pneumothorax .
IMPRESSION: 1. Interim placement of NG tube, its tip is at the gastroesophageal
junction, interim advancement of approximately 12 cm suggested.
Endotracheal tube in stable position.

2. Persistent prominent bilateral pulmonary infiltrates and/or
edema. Persistent small bilateral pleural effusions.

3. Persistent cardiomegaly.

## 2017-09-26 IMAGING — CR DG CHEST 1V PORT
1 series · 1 of 1 positions shown · non-contrast
Comparison: Chest radiograph August 02, 2016 at 5045 hours

CLINICAL DATA: Evaluate central line placement.

EXAM:
PORTABLE CHEST 1 VIEW

[portable]
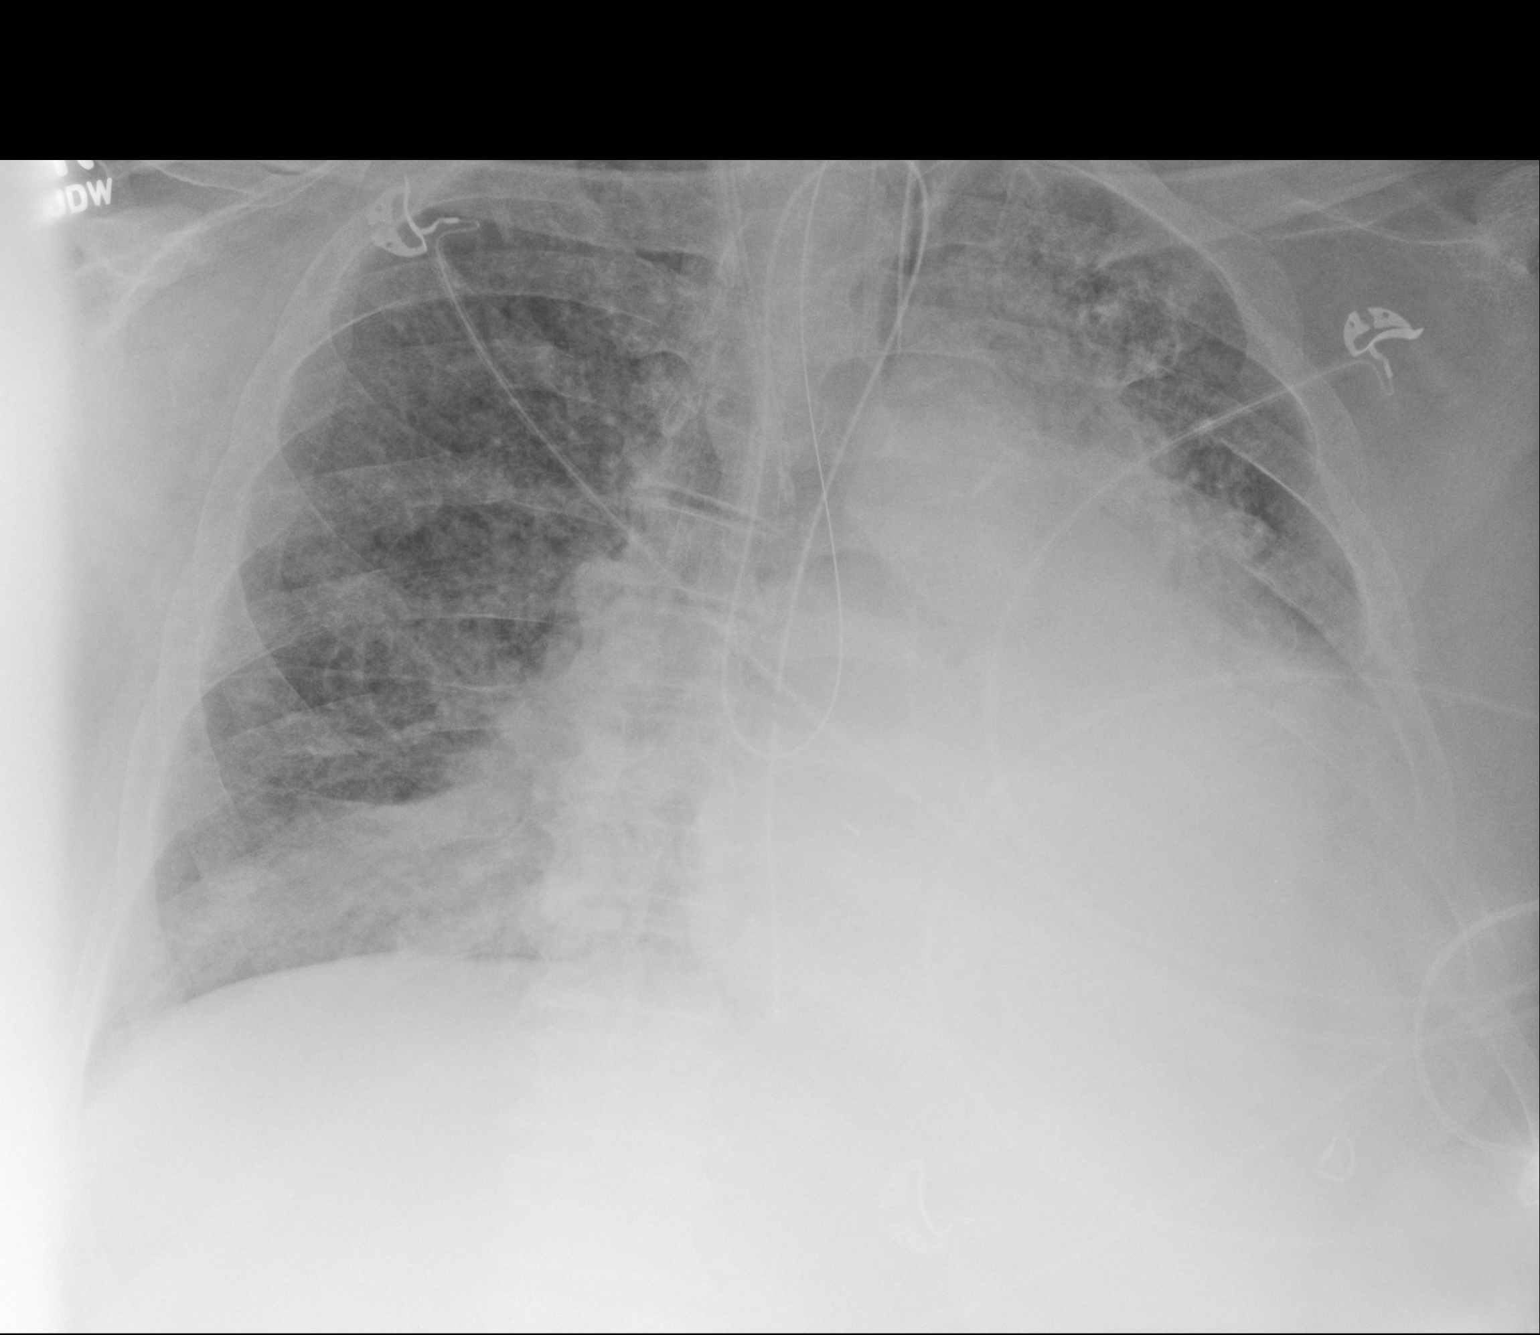

[1 of 1 positions shown; findings below may reference images not displayed]

FINDINGS: Endotracheal tube tip projects 4.9 cm above the carina. Nasogastric
tube looped looped in the distribution of the mid esophagus with
distal tip projecting at GE junction. RIGHT internal jugular central
venous catheter tip projects in mid superior vena cava.

Patient rotated to the LEFT. The cardiac silhouette is moderately
enlarged. Calcified aortic knob. Pulmonary vascular congestion and
interstitial prominence. Bibasilar strandy densities with small to
moderate LEFT pleural effusion. No pneumothorax. Soft tissue planes
and included osseous structures are unchanged.
IMPRESSION: Endotracheal tube tip projects 4.9 cm above the carina. Nasogastric
tube looped looped in the distribution of the mid esophagus with
distal tip projecting at GE junction. RIGHT internal jugular central
venous catheter tip projects in mid superior vena cava. No
pneumothorax.

Stable cardiomegaly and findings of CHF with bibasilar probable
atelectasis and small to moderate LEFT pleural effusion.

## 2017-09-26 IMAGING — CR DG CHEST 1V PORT
1 series · 1 of 1 positions shown · non-contrast
Comparison: 08/01/2016

CLINICAL DATA: Acute respiratory failure.  Intubated.

EXAM:
PORTABLE CHEST 1 VIEW

[AP]
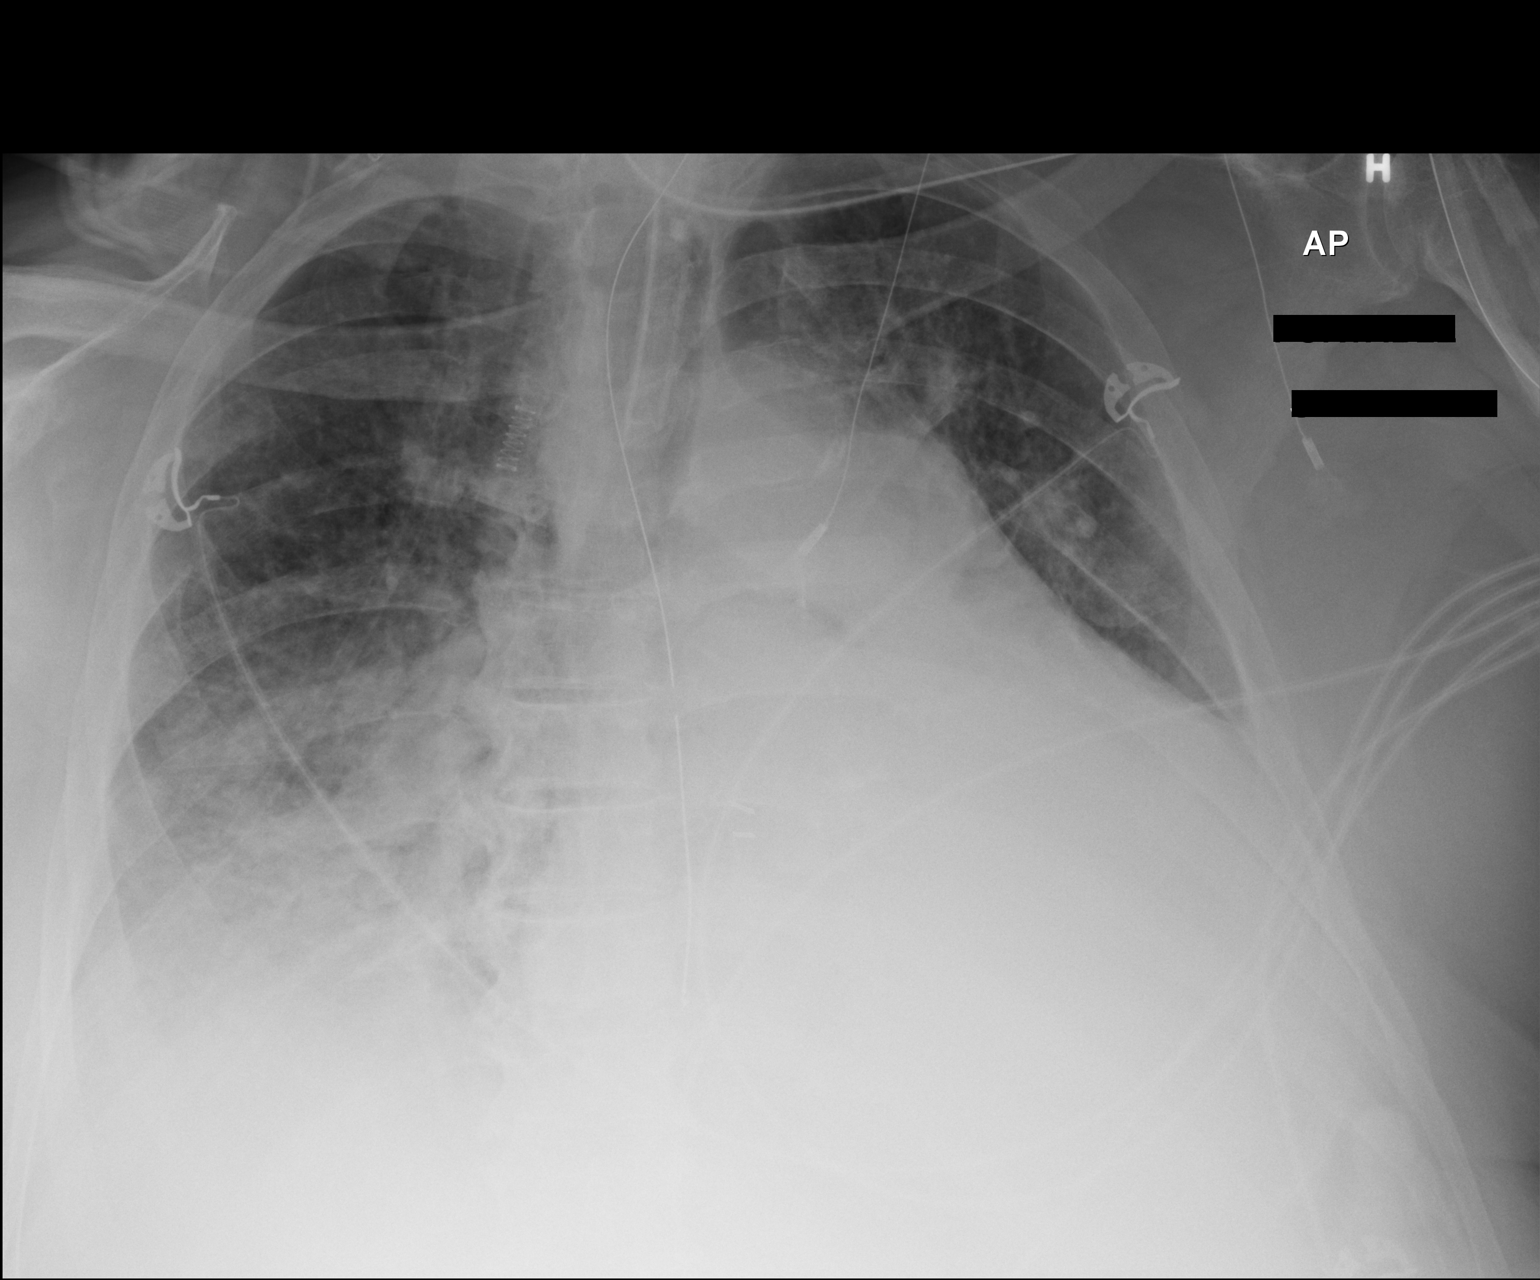

[1 of 1 positions shown; findings below may reference images not displayed]

FINDINGS: Endotracheal tube terminates 3.4 cm above the carina. Enteric tube
terminates over the distal esophagus, unchanged. The cardiac
silhouette remains enlarged. Bilateral perihilar and basilar lung
opacities have not significantly changed, including dense
retrocardiac consolidation. No large pleural effusion or
pneumothorax is identified.
IMPRESSION: 1. Unchanged bilateral airspace disease.
2. Unchanged positioning of enteric tube, terminating over the
distal esophagus.

## 2017-09-27 IMAGING — CR DG CHEST 1V PORT
1 series · 1 of 1 positions shown · non-contrast
Comparison: August 02, 2016

CLINICAL DATA: Hypoxia

EXAM:
PORTABLE CHEST 1 VIEW

[AP]
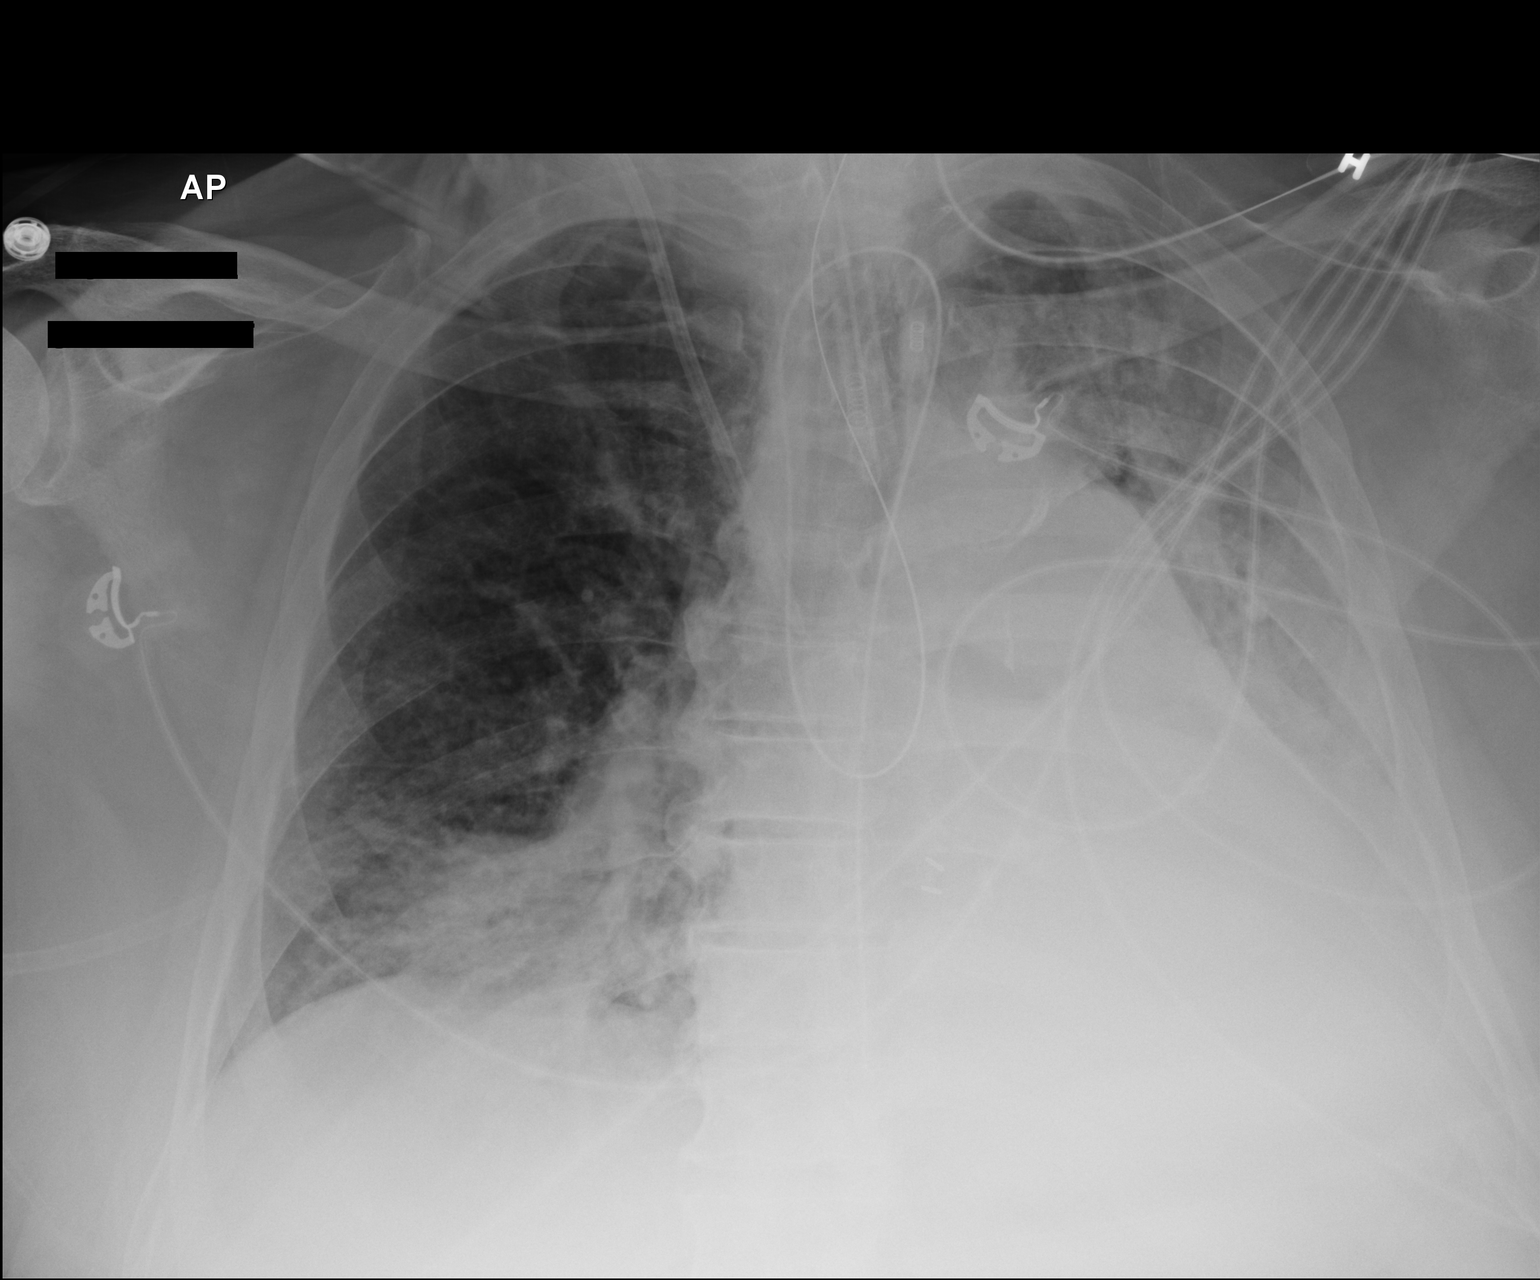

[1 of 1 positions shown; findings below may reference images not displayed]

FINDINGS: Endotracheal tube tip is 3.0 cm above the carina. Nasogastric tube
is again seen tube looped within the esophagus with the tip at the
gastroesophageal junction. Central catheter tip is in the superior
vena cava. No pneumothorax. There is cardiomegaly with pulmonary
venous hypertension. There is airspace consolidation in both lower
lobes as well as in the left upper lobe. There is a moderate
effusion on the left.
IMPRESSION: Tube and catheter positions as described without pneumothorax. Note
that the nasogastric tube remains looped in the esophagus with the
tip near the gastroesophageal junction.

Evidence a degree of congestive heart failure. Suspect areas of
superimposed pneumonia, although the areas of airspace opacity could
represent alveolar edema. Note that both alveolar edema and
pneumonia may exist concurrently.

These results will be called to the ordering clinician or
representative by the Radiologist Assistant, and communication
documented in the PACS or zVision Dashboard.

## 2017-09-29 IMAGING — CR DG CHEST 1V PORT
1 series · 1 of 1 positions shown · non-contrast
Comparison: Radiographs 08/04/2016 and 08/03/2016.

CLINICAL DATA: Respiratory failure. History of cancer, congestive
heart failure and hypertension.

EXAM:
PORTABLE CHEST 1 VIEW

[AP]
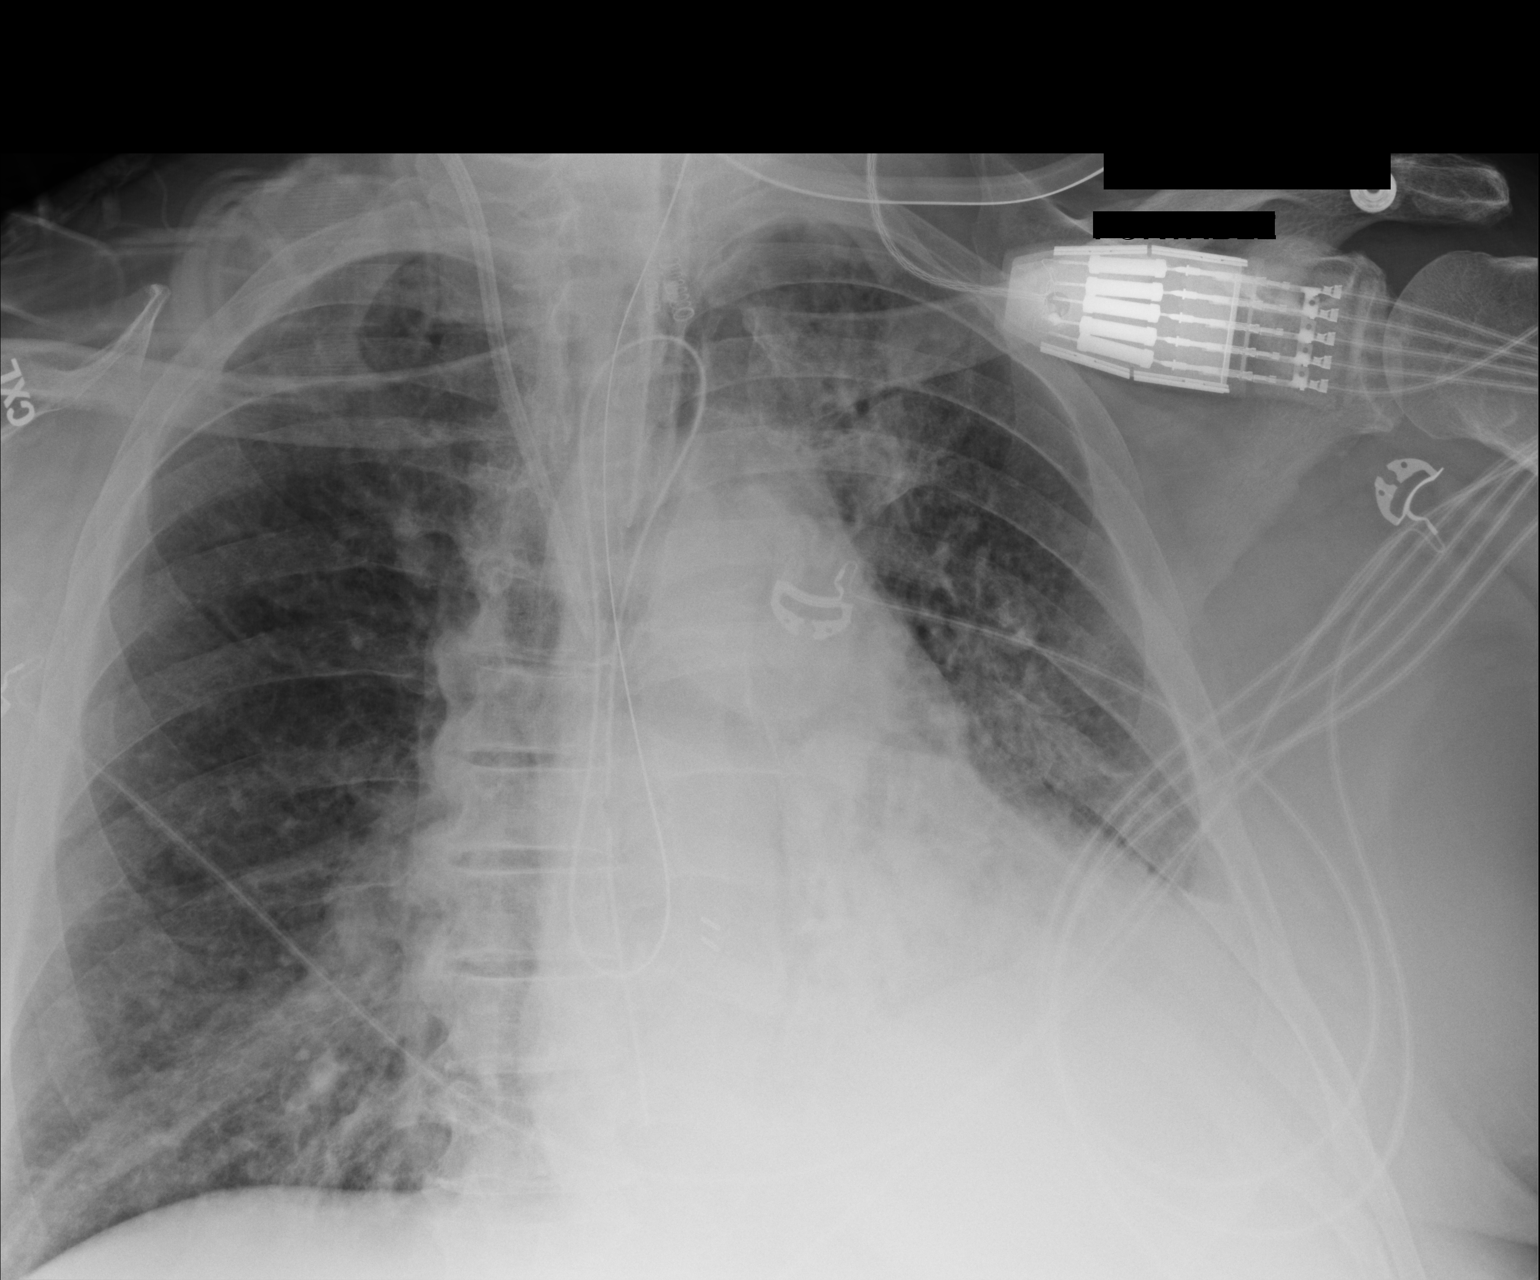

[1 of 1 positions shown; findings below may reference images not displayed]

FINDINGS: 3479 hours. The endotracheal tube is unchanged in the mid trachea.
Right IJ central venous catheter projects to lower SVC level. The
nasogastric tube remains coiled within the esophagus, as it has been
since 08/02/2016. Its tip overlies the distal esophagus. There is
stable cardiomegaly and aortic atherosclerosis. There are persistent
left-greater-than-right basilar airspace opacities, slowly improving
over the last several days. There may be a small amount of pleural
fluid on the left. No evidence of pneumothorax.
IMPRESSION: 1. Slowly improving left-greater-than-right basilar airspace
opacities, most consistent with multifocal pneumonia, possibly on
the basis of aspiration.
2. Stable support system. The enteric tube remains coiled in the
esophagus, as it has been over the last 3 days.

## 2018-04-02 ENCOUNTER — Telehealth: Payer: Self-pay

## 2018-04-02 NOTE — Telephone Encounter (Signed)
On 04/02/18 I received a supplemental report from [facility].  There is a family member needing something added. The physician is Mannam.  The copy of the d/c and the supplemental report will be taken to [facility] for correction.  On 04/05/18 I received the supplemental report back from Doctor Mannam.  I called the funeral home to let them know the report was ready for pickup.
